# Patient Record
Sex: Male | Born: 1960 | Race: White | Hispanic: No | Smoking: Former smoker
Health system: Southern US, Community
[De-identification: ages and names within clinical notes are randomized; demographics above are authoritative.]

## PROBLEM LIST (undated history)

## (undated) DIAGNOSIS — N200 Calculus of kidney: Secondary | ICD-10-CM

## (undated) DIAGNOSIS — Z87442 Personal history of urinary calculi: Secondary | ICD-10-CM

## (undated) DIAGNOSIS — K219 Gastro-esophageal reflux disease without esophagitis: Secondary | ICD-10-CM

## (undated) DIAGNOSIS — F32A Depression, unspecified: Secondary | ICD-10-CM

## (undated) DIAGNOSIS — F319 Bipolar disorder, unspecified: Secondary | ICD-10-CM

## (undated) DIAGNOSIS — F4310 Post-traumatic stress disorder, unspecified: Secondary | ICD-10-CM

## (undated) DIAGNOSIS — Z8719 Personal history of other diseases of the digestive system: Secondary | ICD-10-CM

## (undated) DIAGNOSIS — M1990 Unspecified osteoarthritis, unspecified site: Secondary | ICD-10-CM

## (undated) DIAGNOSIS — I82409 Acute embolism and thrombosis of unspecified deep veins of unspecified lower extremity: Secondary | ICD-10-CM

## (undated) DIAGNOSIS — I219 Acute myocardial infarction, unspecified: Secondary | ICD-10-CM

## (undated) DIAGNOSIS — E039 Hypothyroidism, unspecified: Secondary | ICD-10-CM

## (undated) DIAGNOSIS — I10 Essential (primary) hypertension: Secondary | ICD-10-CM

## (undated) DIAGNOSIS — R5383 Other fatigue: Secondary | ICD-10-CM

## (undated) DIAGNOSIS — E785 Hyperlipidemia, unspecified: Secondary | ICD-10-CM

## (undated) DIAGNOSIS — J449 Chronic obstructive pulmonary disease, unspecified: Secondary | ICD-10-CM

## (undated) DIAGNOSIS — I509 Heart failure, unspecified: Secondary | ICD-10-CM

## (undated) DIAGNOSIS — N400 Enlarged prostate without lower urinary tract symptoms: Secondary | ICD-10-CM

## (undated) DIAGNOSIS — G709 Myoneural disorder, unspecified: Secondary | ICD-10-CM

## (undated) DIAGNOSIS — I2510 Atherosclerotic heart disease of native coronary artery without angina pectoris: Secondary | ICD-10-CM

## (undated) DIAGNOSIS — K259 Gastric ulcer, unspecified as acute or chronic, without hemorrhage or perforation: Secondary | ICD-10-CM

## (undated) DIAGNOSIS — I209 Angina pectoris, unspecified: Secondary | ICD-10-CM

## (undated) DIAGNOSIS — T7840XA Allergy, unspecified, initial encounter: Secondary | ICD-10-CM

## (undated) DIAGNOSIS — J441 Chronic obstructive pulmonary disease with (acute) exacerbation: Secondary | ICD-10-CM

## (undated) HISTORY — DX: Calculus of kidney: N20.0

## (undated) HISTORY — PX: APPENDECTOMY: SHX54

## (undated) HISTORY — DX: Chronic obstructive pulmonary disease with (acute) exacerbation: J44.1

## (undated) HISTORY — PX: HEMORRHOIDECTOMY WITH HEMORRHOID BANDING: SHX5633

## (undated) HISTORY — PX: OTHER SURGICAL HISTORY: SHX169

## (undated) HISTORY — DX: Allergy, unspecified, initial encounter: T78.40XA

## (undated) HISTORY — DX: Myoneural disorder, unspecified: G70.9

## (undated) HISTORY — DX: Other fatigue: R53.83

## (undated) HISTORY — PX: JOINT REPLACEMENT: SHX530

## (undated) HISTORY — PX: INNER EAR SURGERY: SHX679

## (undated) HISTORY — PX: HERNIA REPAIR: SHX51

---

## 2004-08-14 ENCOUNTER — Emergency Department: Payer: Self-pay | Admitting: Emergency Medicine

## 2004-08-26 ENCOUNTER — Ambulatory Visit: Payer: Self-pay | Admitting: Specialist

## 2004-08-29 ENCOUNTER — Ambulatory Visit: Payer: Self-pay | Admitting: Gastroenterology

## 2004-09-30 ENCOUNTER — Ambulatory Visit: Payer: Self-pay | Admitting: Specialist

## 2004-10-08 ENCOUNTER — Ambulatory Visit: Payer: Self-pay | Admitting: Gastroenterology

## 2004-10-14 ENCOUNTER — Inpatient Hospital Stay: Payer: Self-pay | Admitting: Unknown Physician Specialty

## 2004-11-18 ENCOUNTER — Ambulatory Visit: Payer: Self-pay | Admitting: Gastroenterology

## 2005-01-20 ENCOUNTER — Emergency Department: Payer: Self-pay | Admitting: Internal Medicine

## 2005-01-24 ENCOUNTER — Ambulatory Visit: Payer: Self-pay | Admitting: Specialist

## 2005-03-06 ENCOUNTER — Ambulatory Visit: Payer: Self-pay | Admitting: Specialist

## 2005-09-19 ENCOUNTER — Observation Stay: Payer: Self-pay | Admitting: Internal Medicine

## 2005-09-19 ENCOUNTER — Other Ambulatory Visit: Payer: Self-pay

## 2005-09-20 ENCOUNTER — Inpatient Hospital Stay: Payer: Self-pay | Admitting: Psychiatry

## 2006-03-07 ENCOUNTER — Inpatient Hospital Stay: Payer: Self-pay | Admitting: Internal Medicine

## 2006-03-29 ENCOUNTER — Emergency Department: Payer: Self-pay | Admitting: Internal Medicine

## 2006-05-09 ENCOUNTER — Emergency Department: Payer: Self-pay | Admitting: Emergency Medicine

## 2006-09-01 ENCOUNTER — Ambulatory Visit: Payer: Self-pay | Admitting: Anesthesiology

## 2007-06-13 ENCOUNTER — Emergency Department: Payer: Self-pay | Admitting: Emergency Medicine

## 2008-03-22 ENCOUNTER — Ambulatory Visit: Payer: Self-pay | Admitting: Urology

## 2008-05-24 ENCOUNTER — Ambulatory Visit: Payer: Self-pay | Admitting: Gastroenterology

## 2009-01-29 ENCOUNTER — Ambulatory Visit: Payer: Self-pay | Admitting: Urology

## 2009-02-16 ENCOUNTER — Ambulatory Visit: Payer: Self-pay | Admitting: Urology

## 2009-04-02 ENCOUNTER — Ambulatory Visit: Payer: Self-pay | Admitting: Gastroenterology

## 2009-04-30 ENCOUNTER — Ambulatory Visit: Payer: Self-pay | Admitting: Urology

## 2009-05-14 ENCOUNTER — Ambulatory Visit: Payer: Self-pay | Admitting: Urology

## 2009-05-16 ENCOUNTER — Ambulatory Visit: Payer: Self-pay | Admitting: Gastroenterology

## 2009-06-11 ENCOUNTER — Ambulatory Visit: Payer: Self-pay | Admitting: Urology

## 2009-06-18 ENCOUNTER — Ambulatory Visit: Payer: Self-pay | Admitting: Urology

## 2009-07-19 ENCOUNTER — Ambulatory Visit: Payer: Self-pay | Admitting: Urology

## 2009-07-30 ENCOUNTER — Ambulatory Visit: Payer: Self-pay | Admitting: Urology

## 2009-08-07 ENCOUNTER — Ambulatory Visit: Payer: Self-pay | Admitting: Urology

## 2009-08-20 ENCOUNTER — Ambulatory Visit: Payer: Self-pay | Admitting: Urology

## 2009-09-17 ENCOUNTER — Ambulatory Visit: Payer: Self-pay | Admitting: Urology

## 2009-10-01 ENCOUNTER — Ambulatory Visit: Payer: Self-pay | Admitting: Urology

## 2009-10-19 ENCOUNTER — Ambulatory Visit: Payer: Self-pay | Admitting: Urology

## 2009-11-08 ENCOUNTER — Ambulatory Visit: Payer: Self-pay | Admitting: Urology

## 2009-11-13 ENCOUNTER — Ambulatory Visit: Payer: Self-pay | Admitting: Urology

## 2009-12-18 ENCOUNTER — Encounter: Payer: Self-pay | Admitting: Orthopedic Surgery

## 2010-01-13 ENCOUNTER — Encounter: Payer: Self-pay | Admitting: Orthopedic Surgery

## 2010-02-13 ENCOUNTER — Encounter: Payer: Self-pay | Admitting: Orthopedic Surgery

## 2010-03-15 ENCOUNTER — Encounter: Payer: Self-pay | Admitting: Orthopedic Surgery

## 2010-03-21 ENCOUNTER — Ambulatory Visit: Payer: Self-pay | Admitting: Orthopedic Surgery

## 2010-04-15 ENCOUNTER — Encounter: Payer: Self-pay | Admitting: Orthopedic Surgery

## 2011-02-02 IMAGING — CR DG IVP HYPERTENSIVE
1 series · 8 of 10 positions shown · non-contrast
Comparison: none

REASON FOR EXAM: kidney stone gross hematuria
COMMENTS:

[Series 1: view not recorded · 0.17mm/px · 8 of 14 slices shown]
[im 1/14]
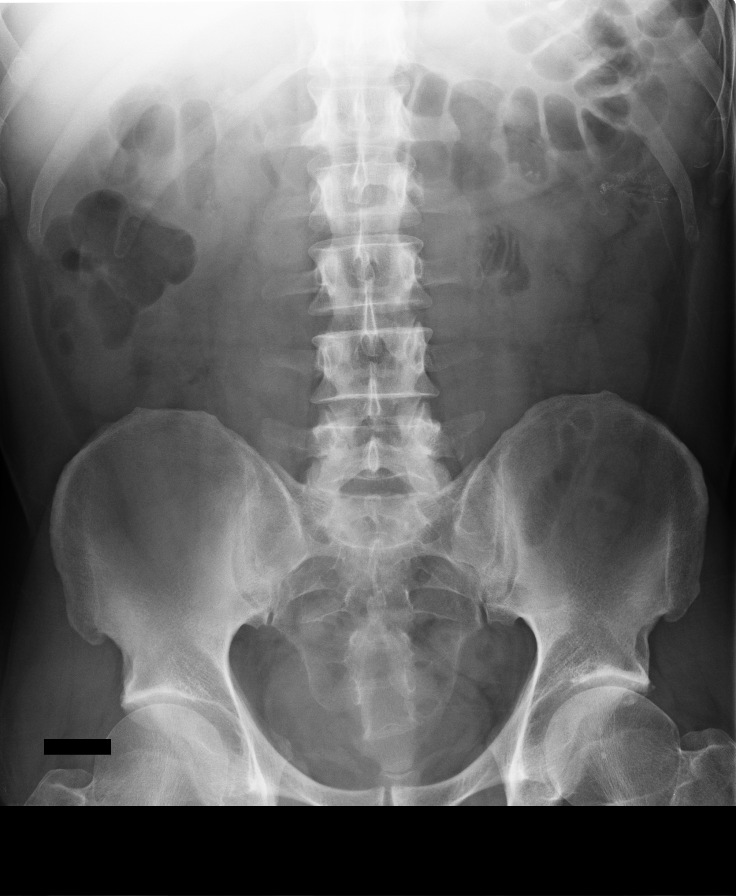
[im 2/14]
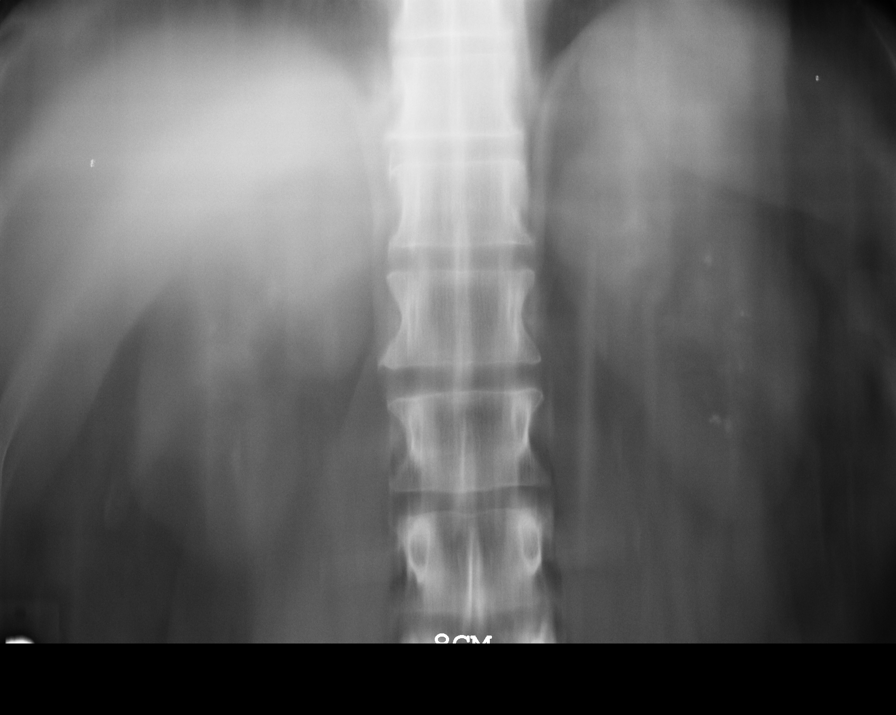
[im 3/14]
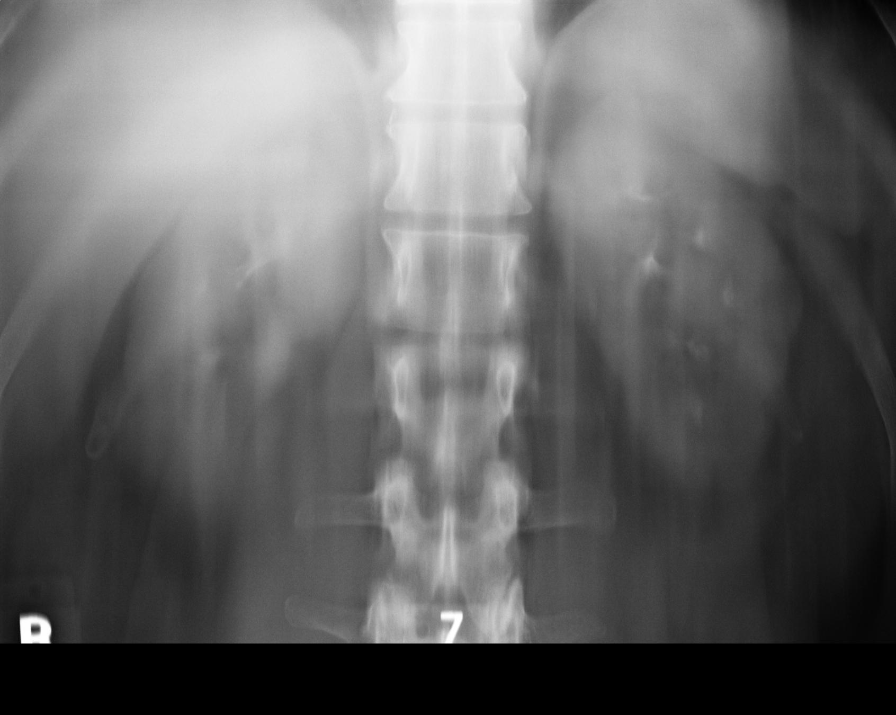
[im 5/14]
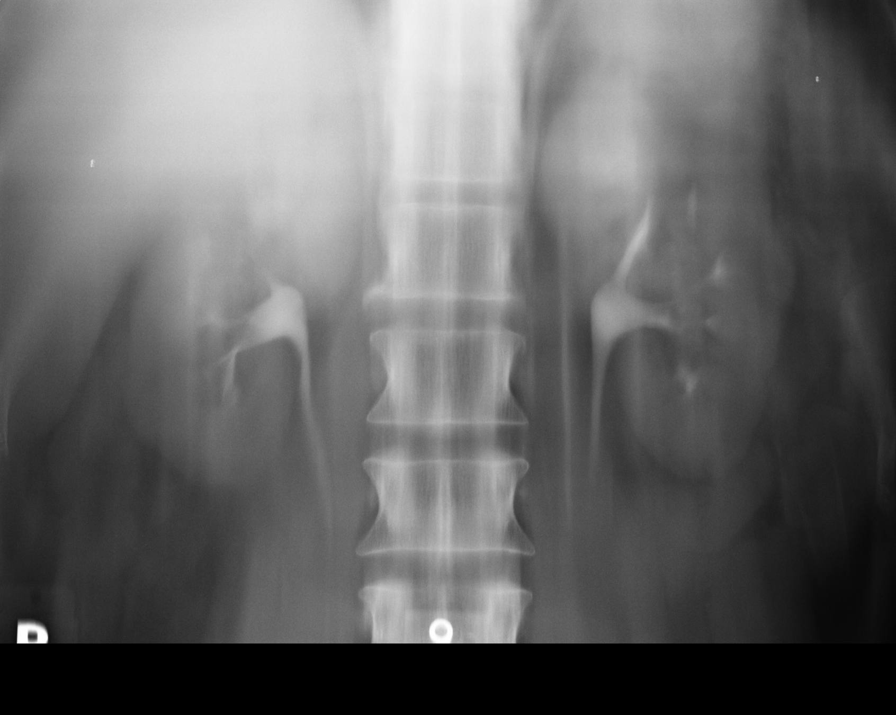
[im 6/14]
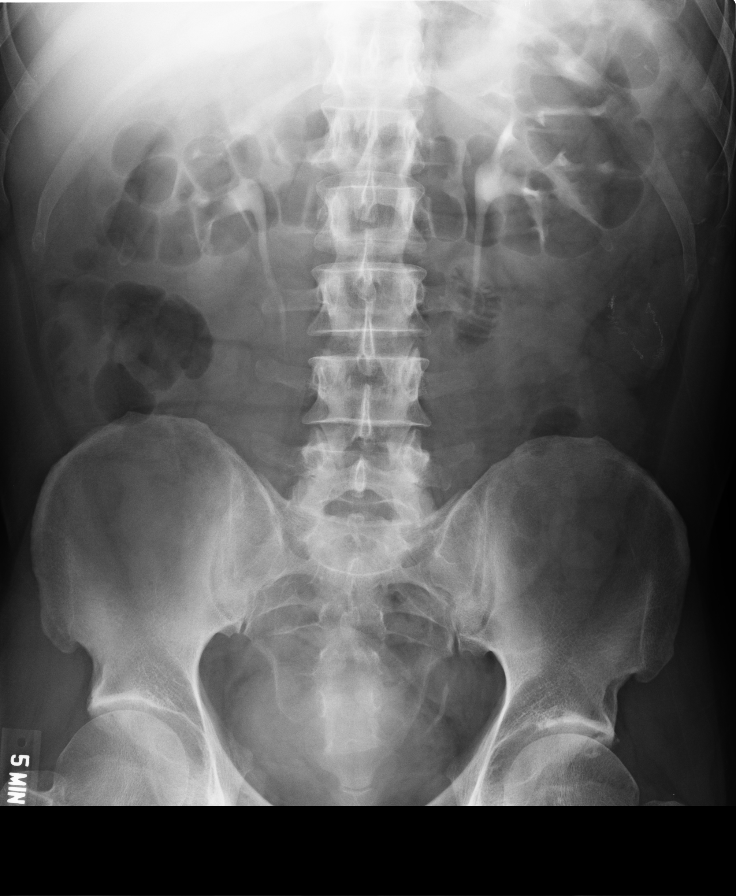
[im 8/14]
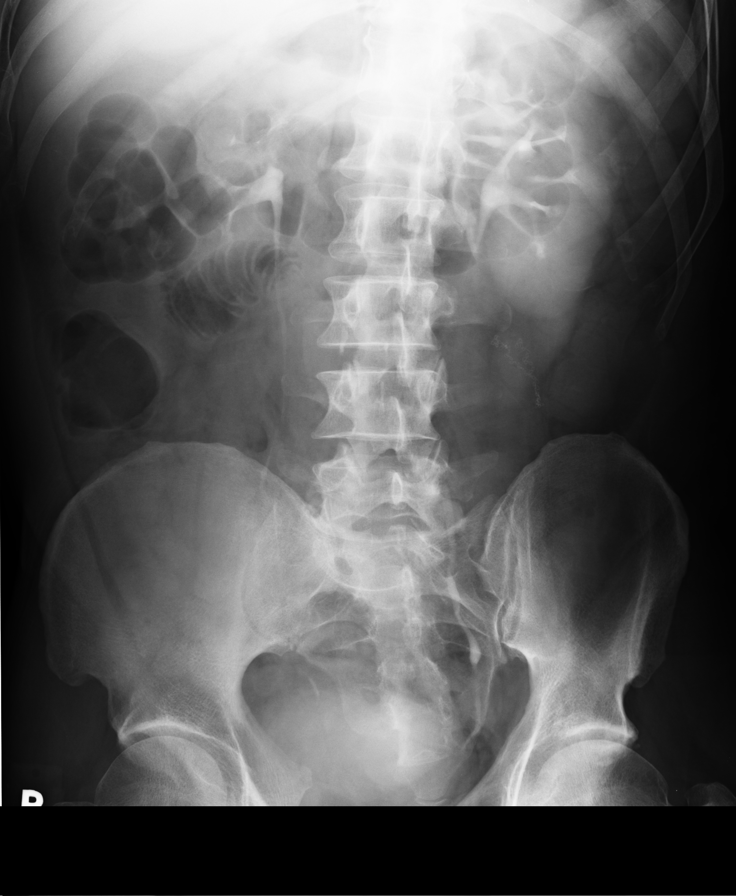
[im 9/14]
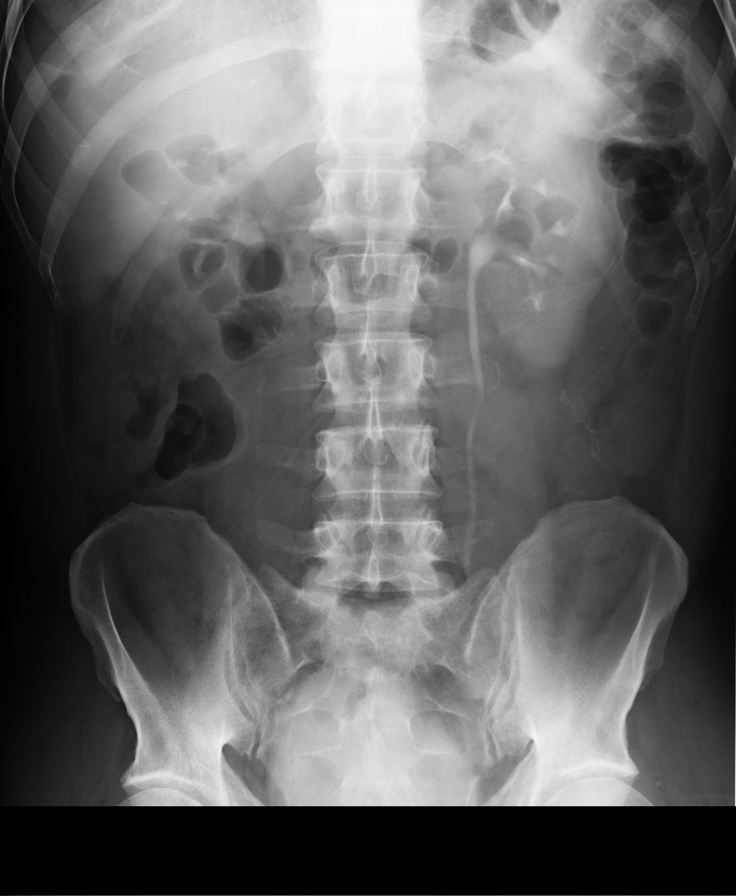
[im 11/14]
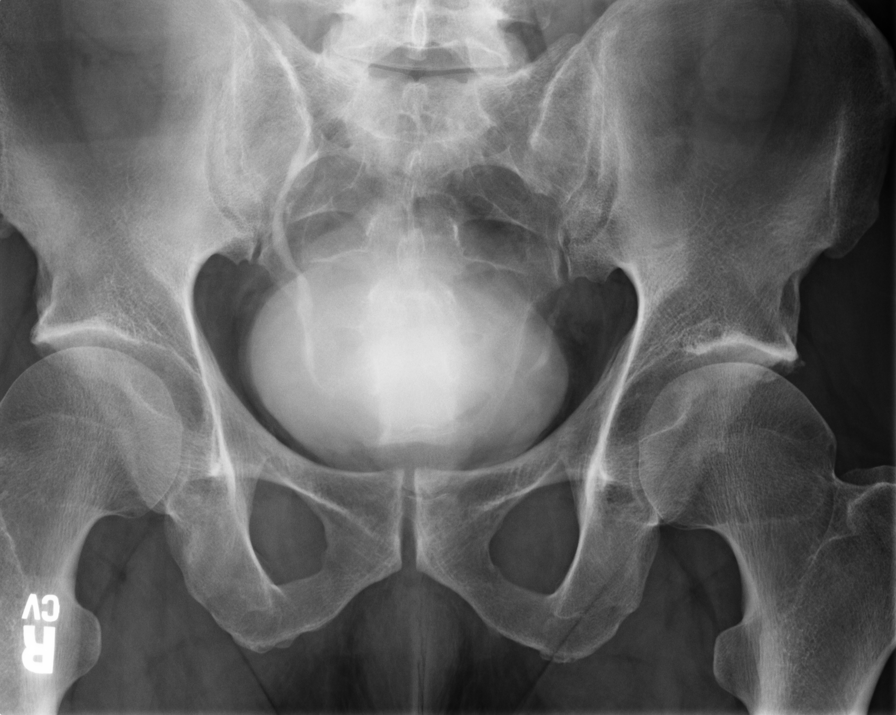

[8 of 10 positions shown; findings below may reference images not displayed]

PROCEDURE:     DXR - DXR INTRAVENOUS UROGRAPHY (IVP)  - February 16, 2009 [DATE]

RESULT:     Following administration of contrast there is prompt bilateral
renal function. Size, shape and position of both kidneys are normal. No
evidence of ureteral obstruction. The bladder is normal.  Postvoid residual
is normal.
IMPRESSION: Normal exam.

## 2011-04-30 IMAGING — CR DG ABDOMEN 1V
1 series · 2 of 2 positions shown · non-contrast
Comparison: none

REASON FOR EXAM: kidney stone
COMMENTS:

[Series 1: view not recorded · 0.17mm/px · 2 of 2 slices shown]
[im 1/2]
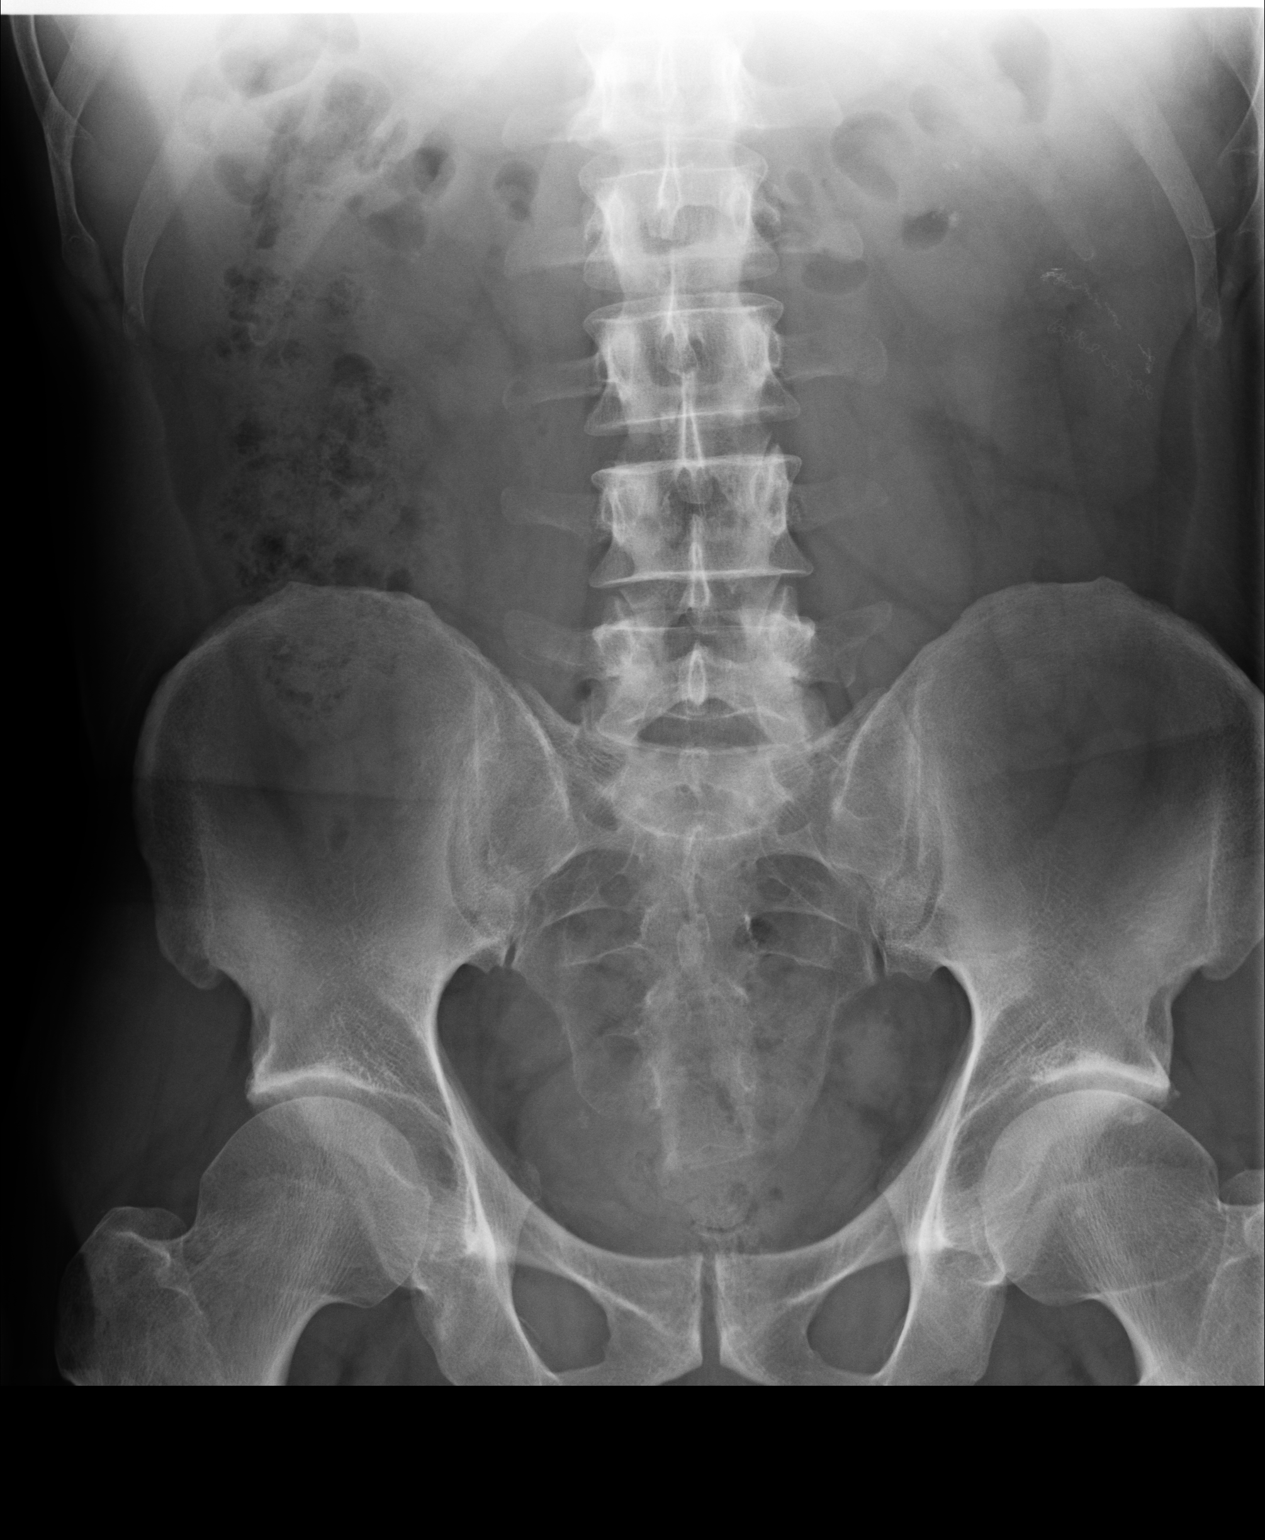
[im 2/2]
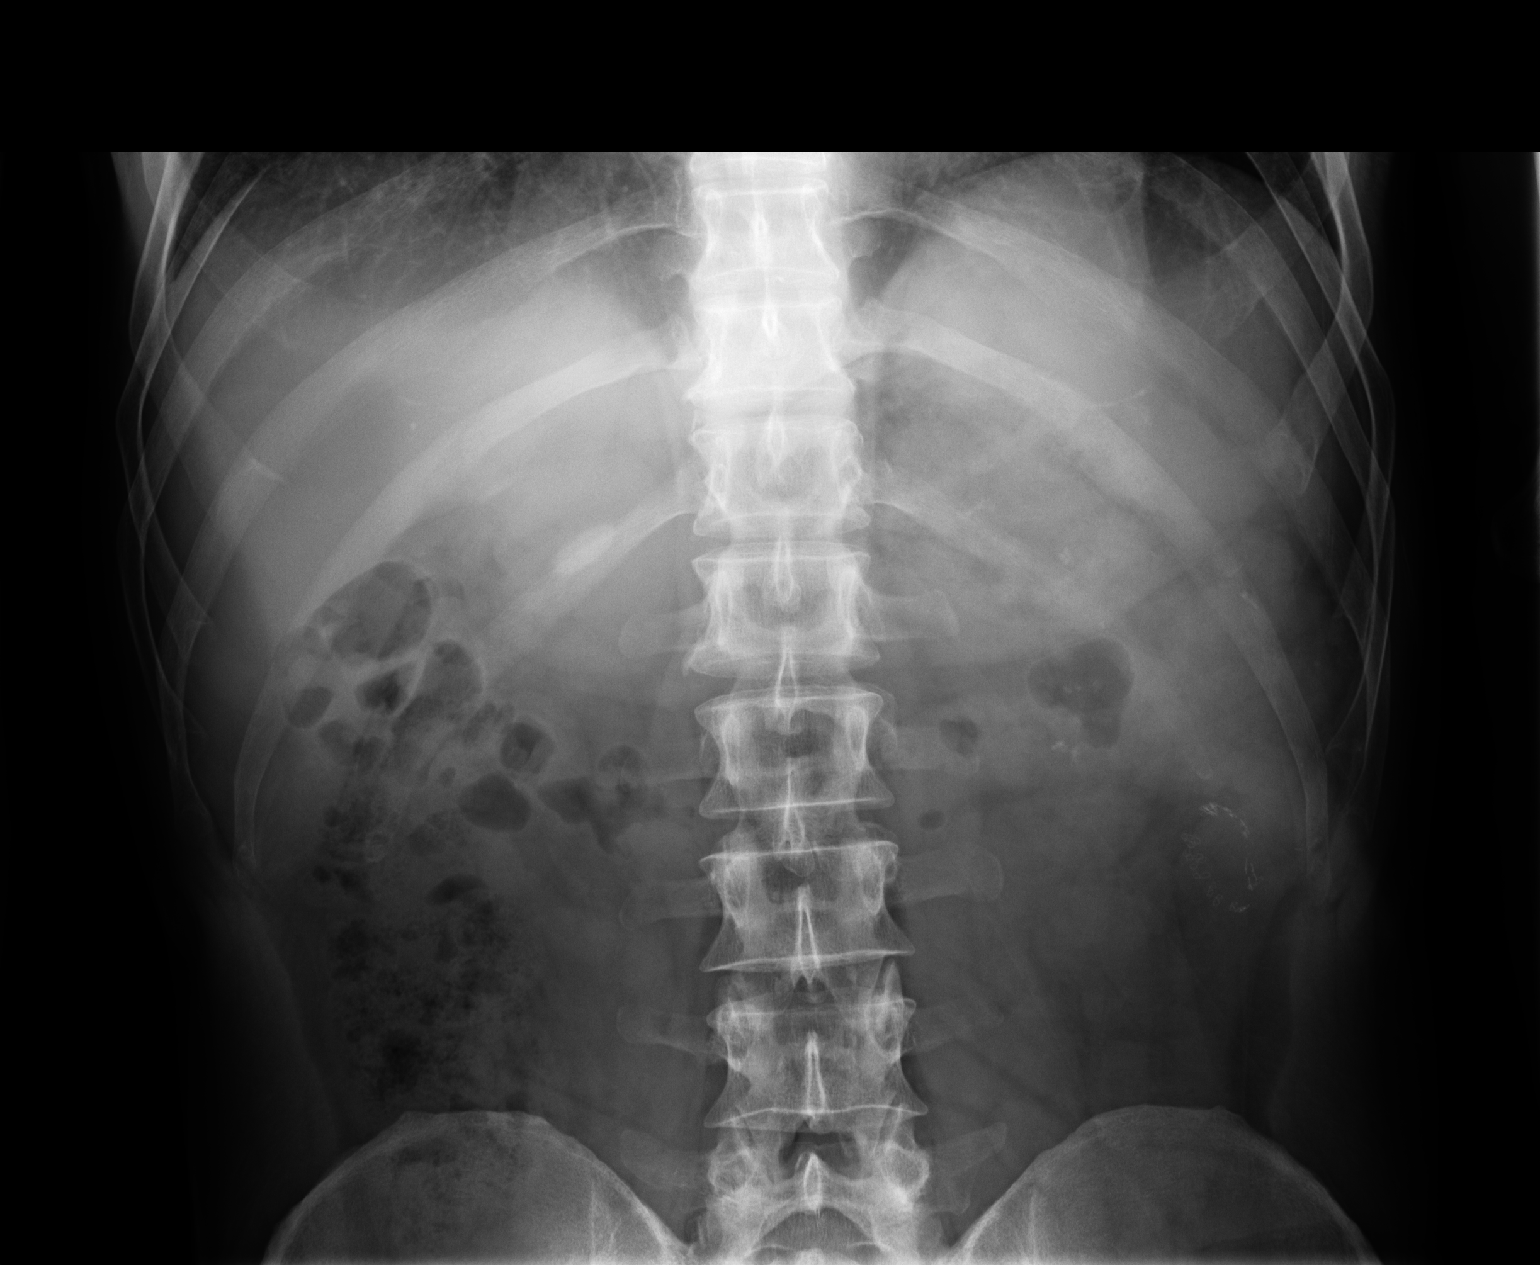

[2 of 2 positions shown; findings below may reference images not displayed]

PROCEDURE:     MDR - MDR KIDNEY URETER BLADDER  - May 14, 2009  [DATE]

RESULT:     Comparison is made to the prior exam of 04/30/2009. Multiple, 3
to 4 mm calcifications are again visualized over the lower pole of the left
kidney. There is a 1 mm calcification in the left midpole region and in the
upper pole a 3 mm calcification is observed. No definite renal
calcifications are seen on the right. The tiny calcific density low in the
right pelvis noted on the exam of 04/30/2009 is no longer seen but in this
exam there is a new 4 mm faint calcific density projected adjacent to the
lateral margin of the right sacrum and which could represent a different
distal right ureteral stone. This density could also be artifactual.
IMPRESSION: 1. Left nephrolithiasis.
2. Possible distal right ureteral stone.
3. Although not mentioned above there are a few tiny densities projected
over the lower pole of the right kidney consistent with right
nephrolithiasis.

## 2011-07-05 IMAGING — CR DG ABDOMEN 1V
1 series · 1 of 1 positions shown · non-contrast
Comparison: none

REASON FOR EXAM: kidney stone
COMMENTS:

[view not recorded]
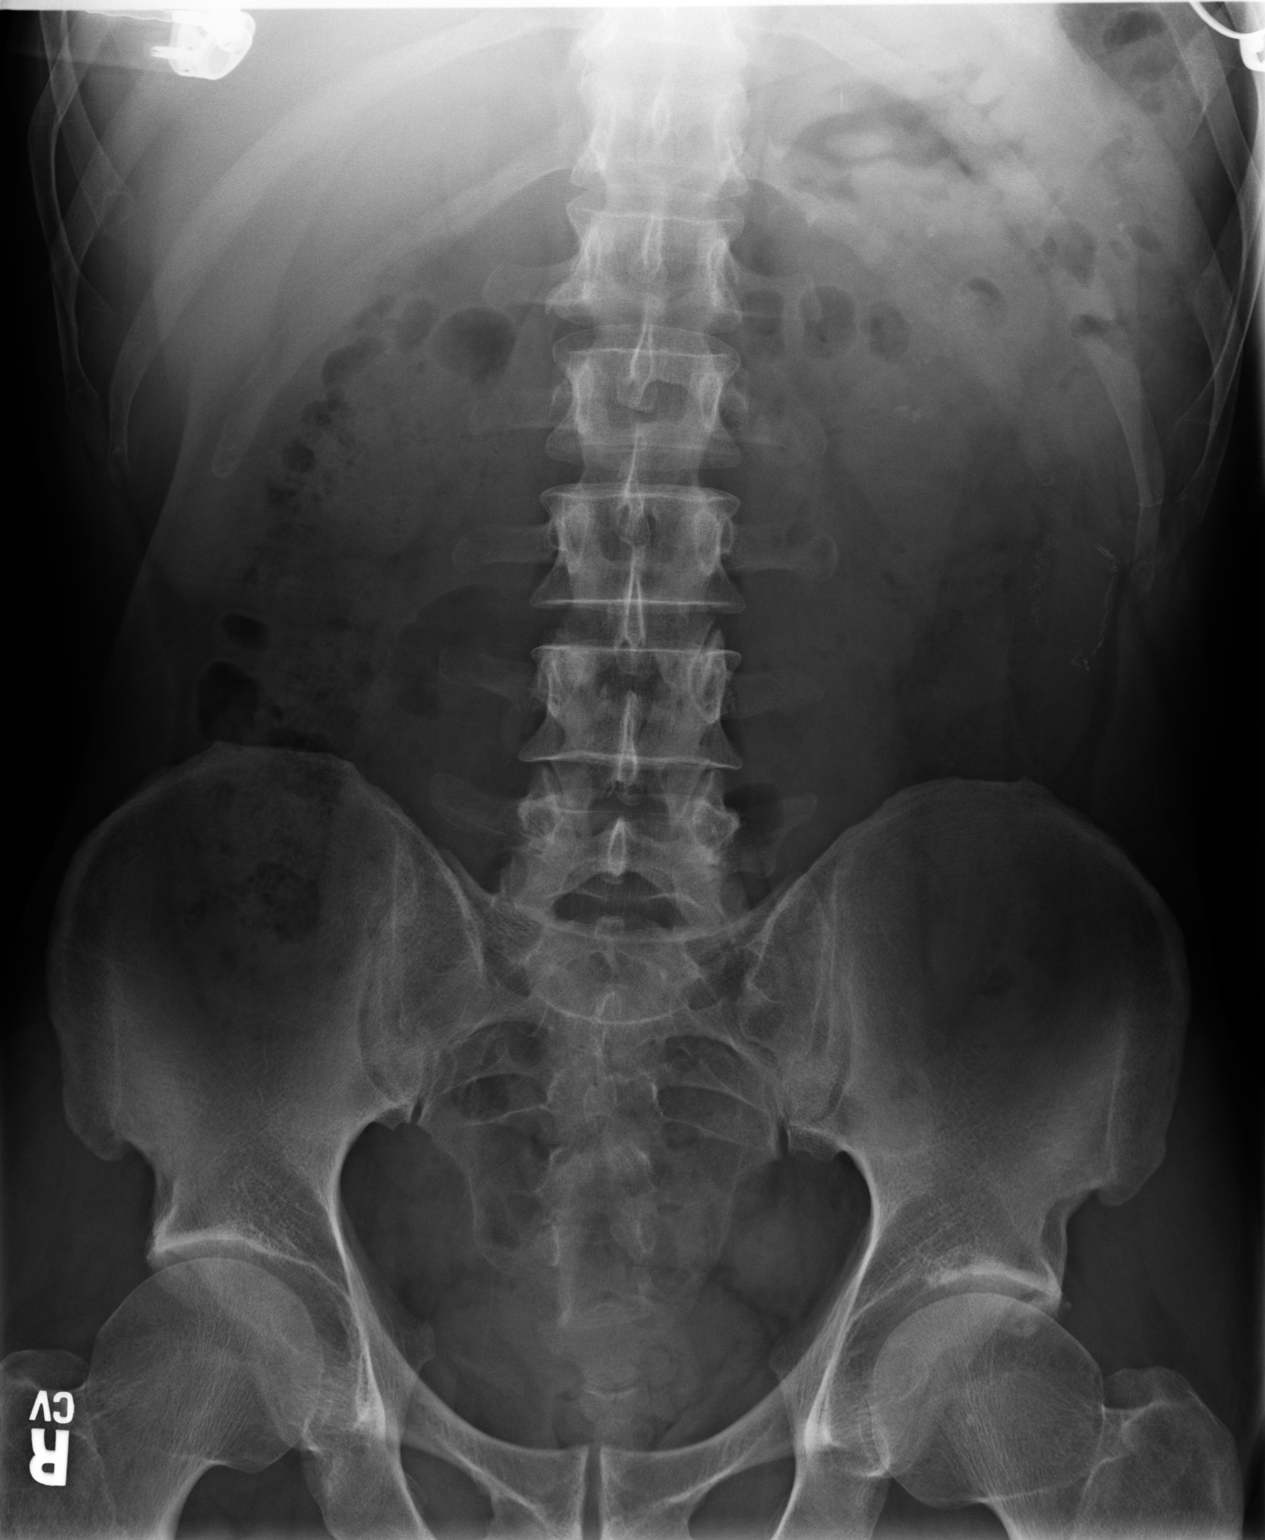

[1 of 1 positions shown; findings below may reference images not displayed]

PROCEDURE:     MDR - MDR KIDNEY URETER BLADDER  - July 19, 2009 [DATE]

RESULT:     Comparison is made to the prior exam of 06/18/2009. There are
again noted multiple small calcifications measuring between 1 and 4 mm in
diameter. These are projected over the upper, mid and lower pole region. No
definite right renal calcifications are seen. No ureteral calcifications are
identified.
IMPRESSION: 1.     Left nephrolithiasis.

## 2012-02-20 ENCOUNTER — Other Ambulatory Visit: Payer: Self-pay | Admitting: Internal Medicine

## 2012-02-20 LAB — CBC WITH DIFFERENTIAL/PLATELET
Basophil %: 0.3 %
Eosinophil #: 0.1 10*3/uL (ref 0.0–0.7)
Eosinophil %: 0.4 %
HCT: 43.1 % (ref 40.0–52.0)
HGB: 13.9 g/dL (ref 13.0–18.0)
Lymphocyte #: 2.1 10*3/uL (ref 1.0–3.6)
Lymphocyte %: 16.1 %
MCHC: 32.2 g/dL (ref 32.0–36.0)
MCV: 97 fL (ref 80–100)
Monocyte #: 0.9 x10 3/mm (ref 0.2–1.0)
Neutrophil #: 9.9 10*3/uL — ABNORMAL HIGH (ref 1.4–6.5)
Platelet: 193 10*3/uL (ref 150–440)
RDW: 13.5 % (ref 11.5–14.5)
WBC: 12.9 10*3/uL — ABNORMAL HIGH (ref 3.8–10.6)

## 2012-02-20 LAB — BASIC METABOLIC PANEL
Anion Gap: 7 (ref 7–16)
Calcium, Total: 8.6 mg/dL (ref 8.5–10.1)
Co2: 27 mmol/L (ref 21–32)
Glucose: 125 mg/dL — ABNORMAL HIGH (ref 65–99)
Sodium: 140 mmol/L (ref 136–145)

## 2012-02-20 LAB — PROTIME-INR: Prothrombin Time: 25 secs — ABNORMAL HIGH (ref 11.5–14.7)

## 2012-02-23 ENCOUNTER — Ambulatory Visit: Payer: Self-pay | Admitting: Internal Medicine

## 2012-02-23 LAB — PROTIME-INR: Prothrombin Time: 13.5 secs (ref 11.5–14.7)

## 2013-08-09 ENCOUNTER — Emergency Department: Payer: Self-pay | Admitting: Emergency Medicine

## 2014-10-05 ENCOUNTER — Ambulatory Visit: Payer: Self-pay | Admitting: Specialist

## 2014-10-05 DIAGNOSIS — I259 Chronic ischemic heart disease, unspecified: Secondary | ICD-10-CM

## 2014-10-05 LAB — BASIC METABOLIC PANEL
Anion Gap: 5 — ABNORMAL LOW (ref 7–16)
BUN: 10 mg/dL (ref 7–18)
Calcium, Total: 9.1 mg/dL (ref 8.5–10.1)
Chloride: 106 mmol/L (ref 98–107)
Co2: 29 mmol/L (ref 21–32)
Creatinine: 1.1 mg/dL (ref 0.60–1.30)
GLUCOSE: 105 mg/dL — AB (ref 65–99)
OSMOLALITY: 279 (ref 275–301)
Potassium: 4.5 mmol/L (ref 3.5–5.1)
Sodium: 140 mmol/L (ref 136–145)

## 2014-10-05 LAB — URINALYSIS, COMPLETE
Bacteria: NONE SEEN
Bilirubin,UR: NEGATIVE
Blood: NEGATIVE
GLUCOSE, UR: NEGATIVE mg/dL (ref 0–75)
KETONE: NEGATIVE
Leukocyte Esterase: NEGATIVE
Nitrite: NEGATIVE
Ph: 7 (ref 4.5–8.0)
Protein: NEGATIVE
RBC,UR: <1 /HPF (ref 0–5)
SPECIFIC GRAVITY: 1.009 (ref 1.003–1.030)
Squamous Epithelial: NONE SEEN

## 2014-10-05 LAB — CBC
HCT: 43.6 % (ref 40.0–52.0)
HGB: 14.3 g/dL (ref 13.0–18.0)
MCH: 30.9 pg (ref 26.0–34.0)
MCHC: 32.8 g/dL (ref 32.0–36.0)
MCV: 94 fL (ref 80–100)
Platelet: 240 10*3/uL (ref 150–440)
RBC: 4.63 10*6/uL (ref 4.40–5.90)
RDW: 14.6 % — ABNORMAL HIGH (ref 11.5–14.5)
WBC: 18.6 10*3/uL — AB (ref 3.8–10.6)

## 2014-10-05 LAB — PROTIME-INR
INR: 1.4
PROTHROMBIN TIME: 16.7 s — AB (ref 11.5–14.7)

## 2014-10-05 LAB — MRSA PCR SCREENING

## 2014-10-05 LAB — APTT: Activated PTT: 36.5 secs — ABNORMAL HIGH (ref 23.6–35.9)

## 2014-10-10 ENCOUNTER — Inpatient Hospital Stay: Payer: Self-pay | Admitting: Specialist

## 2014-10-10 LAB — PROTIME-INR
INR: 0.9
Prothrombin Time: 12.2 secs (ref 11.5–14.7)

## 2014-10-11 LAB — HEMOGLOBIN: HGB: 12.2 g/dL — AB (ref 13.0–18.0)

## 2014-10-11 LAB — BASIC METABOLIC PANEL
Anion Gap: 6 — ABNORMAL LOW (ref 7–16)
BUN: 7 mg/dL (ref 7–18)
CALCIUM: 8.4 mg/dL — AB (ref 8.5–10.1)
CHLORIDE: 101 mmol/L (ref 98–107)
CO2: 31 mmol/L (ref 21–32)
Creatinine: 0.98 mg/dL (ref 0.60–1.30)
EGFR (Non-African Amer.): >60
Glucose: 129 mg/dL — ABNORMAL HIGH (ref 65–99)
Osmolality: 275 (ref 275–301)
Potassium: 3.7 mmol/L (ref 3.5–5.1)
SODIUM: 138 mmol/L (ref 136–145)

## 2014-10-12 LAB — CBC WITH DIFFERENTIAL/PLATELET
Basophil #: 0.1 10*3/uL (ref 0.0–0.1)
Basophil %: 1.1 %
Eosinophil #: 0.1 10*3/uL (ref 0.0–0.7)
Eosinophil %: 0.6 %
HCT: 34.3 % — ABNORMAL LOW (ref 40.0–52.0)
HGB: 11.3 g/dL — ABNORMAL LOW (ref 13.0–18.0)
Lymphocyte #: 1.7 10*3/uL (ref 1.0–3.6)
Lymphocyte %: 13.8 %
MCH: 30.9 pg (ref 26.0–34.0)
MCHC: 33 g/dL (ref 32.0–36.0)
MCV: 94 fL (ref 80–100)
Monocyte #: 1.7 x10 3/mm — ABNORMAL HIGH (ref 0.2–1.0)
Monocyte %: 13.3 %
Neutrophil #: 9 10*3/uL — ABNORMAL HIGH (ref 1.4–6.5)
Neutrophil %: 71.2 %
Platelet: 157 10*3/uL (ref 150–440)
RBC: 3.66 10*6/uL — ABNORMAL LOW (ref 4.40–5.90)
RDW: 14.1 % (ref 11.5–14.5)
WBC: 12.6 10*3/uL — ABNORMAL HIGH (ref 3.8–10.6)

## 2014-10-12 LAB — PROTIME-INR
INR: 1.1
Prothrombin Time: 14.1 secs (ref 11.5–14.7)

## 2014-10-12 LAB — TSH: Thyroid Stimulating Horm: 0.454 u[IU]/mL

## 2014-10-13 LAB — CBC WITH DIFFERENTIAL/PLATELET
BASOS ABS: 0 10*3/uL (ref 0.0–0.1)
BASOS PCT: 0.5 %
Eosinophil #: 0.1 10*3/uL (ref 0.0–0.7)
Eosinophil %: 1.2 %
HCT: 30.3 % — AB (ref 40.0–52.0)
HGB: 10 g/dL — AB (ref 13.0–18.0)
Lymphocyte #: 1.1 10*3/uL (ref 1.0–3.6)
Lymphocyte %: 12.9 %
MCH: 30.9 pg (ref 26.0–34.0)
MCHC: 33 g/dL (ref 32.0–36.0)
MCV: 94 fL (ref 80–100)
MONOS PCT: 12.8 %
Monocyte #: 1.1 x10 3/mm — ABNORMAL HIGH (ref 0.2–1.0)
Neutrophil #: 6.3 10*3/uL (ref 1.4–6.5)
Neutrophil %: 72.6 %
Platelet: 144 10*3/uL — ABNORMAL LOW (ref 150–440)
RBC: 3.23 10*6/uL — AB (ref 4.40–5.90)
RDW: 14.5 % (ref 11.5–14.5)
WBC: 8.7 10*3/uL (ref 3.8–10.6)

## 2014-10-13 LAB — BASIC METABOLIC PANEL
ANION GAP: 5 — AB (ref 7–16)
BUN: 8 mg/dL (ref 7–18)
CHLORIDE: 109 mmol/L — AB (ref 98–107)
CREATININE: 1.04 mg/dL (ref 0.60–1.30)
Calcium, Total: 8.3 mg/dL — ABNORMAL LOW (ref 8.5–10.1)
Co2: 27 mmol/L (ref 21–32)
EGFR (African American): >60
EGFR (Non-African Amer.): >60
Glucose: 125 mg/dL — ABNORMAL HIGH (ref 65–99)
Osmolality: 281 (ref 275–301)
POTASSIUM: 4.1 mmol/L (ref 3.5–5.1)
SODIUM: 141 mmol/L (ref 136–145)

## 2014-10-13 LAB — PROTIME-INR
INR: 2
PROTHROMBIN TIME: 22.1 s — AB (ref 11.5–14.7)

## 2014-10-17 LAB — CULTURE, BLOOD (SINGLE)

## 2015-01-14 NOTE — Op Note (Signed)
PATIENT NAME:  Brett Fernandez, Brett Fernandez MR#:  [id number] DATE OF BIRTH:  06-10-1961  DATE OF PROCEDURE:  10/11/2014  PREOPERATIVE DIAGNOSIS: Severe degenerative arthritis, left knee.   PROCEDURE: LCS left total knee replacement.   SURGEON: Clare Gandy, MD   ASSISTANT: Kathreen Devoid, MD  ANESTHESIA: Spinal.   COMPLICATIONS: None.   TOURNIQUET TIME: 108 minutes.   DESCRIPTION OF PROCEDURE: Two grams of Ancef was given intravenously prior to the procedure. Spinal anesthesia is induced. The left lower extremity is thoroughly prepped with ChloraPrep and alcohol, and draped in standard sterile fashion. The extremity is wrapped out with the Esmarch bandage, and the pneumatic tourniquet elevated to 325 mmHg. A standard anterior longitudinal incision is made for a total knee replacement, and the dissection carried down to the medial and lateral retinaculum. Standard medial parapatellar incision is made and the knee is flexed, and the patella is reflected laterally. Standard osteophyte and synovial debridement is performed. Retractors are placed and the proximal tibial cutting block is put into place.   Prior to this, it should be noted that a moderate medial release was performed by elevating the tibial collateral ligament off of the femur, and some of the pes anserinus. The proximal tibial cutting block was put into place and pinned, and the proximal tibia was cut in appropriate fashion. The distal femur is sized as a Standard Plus, and the Standard Plus cutting block was drilled and pinned appropriately to the distal femur using the femoral guide for rotation. The anterior and posterior distal femur cuts are made, and are seen to be satisfactory. Distal femoral cutting block, 5 degrees valgus, is impacted into place and pinned, and the distal femoral cut is made. The 10 mm flexion and extension blocks were put into place and were seen to be satisfactory with excellent stability. Femoral shaping  block is impacted into place, and the appropriate cuts made. Proximal tibia is sized as a +5; this is pinned and the central drill hole made. Trial components were then inserted into place and seen to be satisfactory with good stability. Patella is prepared in the standard fashion.   All trial components were then removed. The joint is thoroughly irrigated multiple times with the pulsatile lavage. The +5 metal tibial tray is then cemented into place and the polyethylene rotating platform is then put into position, and then the porous-coated distal femur is impacted into place and the knee extended. There is seen to be full extension with good alignment and stability. Porous-coated patella is impacted into place and is seen to be a good fit.   The wound is thoroughly irrigated multiple times. The peripheral edges of the capsule and the muscle are infiltrated with Exparel. The skin is infiltrated with 0.25% plain Marcaine. Two Autovac drains are brought out through separate stab wound incisions. The fascia is closed with 2-0 Ethibond. Subcutaneous tissue is closed with 2-0 Vicryl, and the skin is closed with the skin stapler. A soft bulky dressing is applied. Two Autovac drains were brought out through separate stab wound incisions. Polar Care and knee immobilizer were applied, and the tourniquet is released. The patient is returned to the recovery room in satisfactory condition, having tolerated the procedure quite well.    ____________________________ Clare Gandy, MD ces:MT D: 10/11/2014 07:53:34 ET T: 10/11/2014 10:27:22 ET JOB#: [id number]  cc: Clare Gandy, MD, <Dictator> Clare Gandy MD ELECTRONICALLY SIGNED 10/11/2014 13:33

## 2015-01-14 NOTE — Consult Note (Signed)
PATIENT NAME:  Brett Fernandez, BOQUET MR#:  [id number] DATE OF BIRTH:  Apr 27, 1961  DATE OF CONSULTATION:  10/12/2014  REFERRING PHYSICIAN:  Kathreen Devoid, MD  CONSULTING PHYSICIAN:  Kelton Pillar. Sheryle Hail, MD  CONSULTATION QUESTION:  Etiology for sinus tachycardia.   HISTORY OF PRESENT ILLNESS:  This is a 54 year old Caucasian male who had been admitted to the hospital for a routine left total knee replacement. The patient underwent the procedure without any complications. He is currently on anticoagulation status post orthopedic surgery as well as bridging warfarin for DVT prophylaxis. He was resting comfortably in bed but had persistent tachycardia, which prompted the staff to call for consultation.   REVIEW OF SYSTEMS:  The patient is very groggy, as it is the middle of the night. He does not endorse any pain in his chest or shortness of breath. He also denies nausea, but mainly complains of pain in his left knee. It is difficult to understand any of the rest of his answers, but he does not explicitly endorse any complaints aside from the aforementioned pain in his knee.   PAST MEDICAL HISTORY:  Coronary artery disease status post MI, hypertension, COPD, GERD, hiatal hernia, tobacco abuse, history of depression with suicide attempts, history of TIA, history of kidney stones, and history of DVT.   PAST SURGICAL HISTORY:  Cardiac catheterization, ear surgery, and multiple leg surgeries.   SOCIAL HISTORY:  The patient smokes 1-1/2 packs per day and has done so for the last 22 years. He denies alcohol or drug use.   FAMILY HISTORY:  Significant for multiple psychiatric illnesses.   PERTINENT LABORATORY RESULTS AND RADIOGRAPHIC FINDINGS:  Serum glucose is 129, BUN 7, creatinine 0.98, serum sodium 138, potassium 3.7, chloride 101, bicarbonate 31, and calcium is 8.4. Thyroid stimulating hormone is 0.45. White blood cell count is 12.6, hemoglobin 11.3, hematocrit 34.3, and platelet count is 157,000. MCV  is 94. INR is 0.9 as of 2 days ago.   There is no recent imaging.   MEDICATIONS: 1.  Hydrocodone 10 mg oral capsule extended release 1 tablet p.o. t.i.d.  2.  Flomax 0.4 mg 1 capsule p.o. daily.  3.  Isosorbide mononitrate 30 mg extended release 1 tablet p.o. daily.  4.  Coumadin 5 mg 1 tablet p.o. daily.  5.  Clonazepam 2 mg 1 tablet p.o. b.i.d.  6.  Cymbalta 60 mg delayed-release capsule 1 capsule p.o. b.i.d.  7.  Mirtazapine 15 mg 1 tablet p.o. b.i.d.  8.  Promethazine 25 mg 1 tablet p.o. b.i.d.  9.  Loratadine 10 mg 1 tablet p.o. daily.  10.  Niaspan 500 mg extended release 1 tablet p.o. at bedtime.  11.  Pravastatin 80 mg 1 tablet p.o. at bedtime.  12.  Geodon 60 mg 1 capsule every morning and at bedtime.  13.  Advair Diskus 250 mcg/50 mcg 1 inhalation 2 times a day.   ALLERGIES:  ASPIRIN, SULFA DRUGS, AND BEE STINGS.   PHYSICAL EXAMINATION: VITAL SIGNS:  Temperature is 97.9, pulse 125, respirations 18, blood pressure 107/67, and pulse oximetry is 95% on room air.  GENERAL:  The patient was sleeping comfortably, but is arousable but very groggy. He is in no apparent distress.  HEENT:  Normocephalic, atraumatic. Pupils are equal, round, and reactive to light and accommodation. Extraocular movements are intact. Mucous membranes are moist.  NECK:  Trachea is midline. No adenopathy. Thyroid is nonpalpable and nontender.  CHEST:  Symmetric and atraumatic.  CARDIOVASCULAR:  Tachycardic rhythm. Normal S1, S2.  No rubs, clicks, or murmurs appreciated.  LUNGS:  Clear to auscultation bilaterally. Normal effort and excursion.  ABDOMEN:  Positive bowel sounds. Soft, nontender, nondistended. No hepatosplenomegaly.  GENITOURINARY:  Deferred.  MUSCULOSKELETAL:  The patient is able to wiggle his right leg and moves both arms without difficulty. There is a knee stabilizer in place on the left lower extremity.  SKIN:  Warm and dry. There are no rashes or lesions.  EXTREMITIES:  No clubbing,  cyanosis, or edema.  NEUROLOGIC:  Cranial nerves II through XII are grossly intact.  PSYCHIATRIC:  Difficult to assess, as the patient clearly has psychoactive drugs on-board, but he appears pleasant and seems to understand the circumstances surrounding his hospitalization; thus, he has some insight and judgment into his medical condition.   ASSESSMENT AND PLAN:  This is a 54 year old male status post left knee arthroplasty, who has multiple medical conditions, who now has sinus tachycardia 1 day postoperatively.   1.  Tachycardia. One might think at this juncture following surgery that he has some atelectasis and perhaps some associated tachycardia. More likely, some of the tachycardia is due to pain; however, I have ordered a thyroid-stimulating hormone, which appears to be normal at this reading. I also ordered a 1 liter bolus to see if this helps lower his heart rate. He is on plenty of anticoagulation at this time and is not short of breath, thus pulmonary embolism from deep vein thrombosis seems to be highly unlikely at this time. He has lost some blood since his surgery yesterday, but certainly not enough to produce such persistent tachycardia. Internal medicine service will continue to follow for further possible intervention. At this time, the patient is stable, and we will continue to monitor him on telemetry.  2.  Deep vein thrombosis prophylaxis. Continue anticoagulation.    ____________________________ Kelton Pillar. Sheryle Hail, MD msd:nb D: 10/12/2014 06:59:25 ET T: 10/12/2014 07:11:52 ET JOB#: [id number]  cc: Kelton Pillar. Sheryle Hail, MD, <Dictator> Kelton Pillar Saraiyah Hemminger MD ELECTRONICALLY SIGNED 10/18/2014 7:00

## 2015-01-14 NOTE — Discharge Summary (Signed)
PATIENT NAME:  Brett Fernandez, Brett Fernandez MR#:  [id number] DATE OF BIRTH:  Nov 27, 1960  DATE OF ADMISSION:  10/10/2014 DATE OF DISCHARGE:    DISCHARGE DIAGNOSES: 1.  Severe degenerative arthritis, left knee.  2.  Acute respiratory failure, likely chronic obstructive pulmonary disease exacerbation. 3.  Aspiration pneumonitis.  4.  Postoperative sinus tachycardia.  5.  Postoperative acute urinary retention.  6.  Previous history of pulmonary embolism.   HISTORY AND PHYSICAL EXAMINATION: As written on admission and as per the office notes which were submitted along with this. Laboratory evaluation and medical clearance was obtained as documented.   HOSPITAL COURSE: The patient was taken to the Operating Room on 10/10/2014 for a left total knee replacement which was performed without difficulty. The patient postoperatively did well until he did develop a tachycardia associated with some fever. The hospitalist system was consulted and chest x-ray demonstrated evidence of some pneumonitis which was felt to be aspiration related. He was treated with Levaquin for this problem and is to continue this for 8 days total. He also had an element of respiratory failure. CT scan of the lungs was obtained which demonstrated probable old small thrombus. He was restarted on his previous Coumadin and Lovenox was started immediately after surgery. By 10/13/2014, his INR was therapeutic and he was placed back on his regular preoperative dose of Coumadin 5 mg per day. Ultrasound of the lower extremities demonstrated no evidence of deep venous thrombosis. He was begun on physical therapy with ambulation, weight-bearing as tolerated. He is to work on vigorous range of motion of the left knee. Please change the bandage as necessary. He is given a return appointment here to be seen in my office in 12 days. He also was started in the hospital on 5 mg of finasteride. Please discontinue the Levaquin in 7 days from today. Otherwise, his orders  are as on the order sheet at discharge.    ____________________________ Clare Gandy, MD ces:at D: 10/13/2014 12:31:02 ET T: 10/13/2014 12:45:37 ET JOB#: [id number]  cc: Clare Gandy, MD, <Dictator> Clare Gandy MD ELECTRONICALLY SIGNED 10/21/2014 11:59

## 2015-01-14 NOTE — Consult Note (Signed)
Brief Consult Note: Diagnosis: Sinus tachycardia.   Patient was seen by consultant.   Consult note dictated.   Orders entered.   Comments: Likely hypovolemia.  Ddx includes hypothyroidism, PE (low likelihood given dual anticoagulant).  Ordered NS bolus.  Check H/H and TSH.  Electronic Signatures: Arnaldo Natal (MD)  (Signed 28-Jan-16 05:37)  Authored: Brief Consult Note   Last Updated: 28-Jan-16 05:37 by Arnaldo Natal (MD)

## 2015-06-22 ENCOUNTER — Telehealth: Payer: Self-pay | Admitting: Gastroenterology

## 2015-06-22 NOTE — Telephone Encounter (Signed)
Called patient for colon triage, he was out of town for a week or more, he said he will contact office when he gets back for colonoscopy

## 2015-10-03 ENCOUNTER — Other Ambulatory Visit: Payer: Medicaid Other

## 2015-10-17 ENCOUNTER — Inpatient Hospital Stay: Admission: RE | Admit: 2015-10-17 | Payer: Medicaid Other | Source: Ambulatory Visit

## 2015-10-17 ENCOUNTER — Other Ambulatory Visit: Payer: Medicaid Other

## 2015-10-24 ENCOUNTER — Ambulatory Visit
Admission: RE | Admit: 2015-10-24 | Discharge: 2015-10-24 | Disposition: A | Discharge status: Home / Self Care | Payer: Medicaid Other | Source: Ambulatory Visit | Attending: Orthopedic Surgery | Admitting: Orthopedic Surgery

## 2015-10-24 ENCOUNTER — Encounter
Admission: RE | Admit: 2015-10-24 | Discharge: 2015-10-24 | Disposition: A | Discharge status: Home / Self Care | Payer: Medicaid Other | Source: Ambulatory Visit | Attending: Orthopedic Surgery | Admitting: Orthopedic Surgery

## 2015-10-24 DIAGNOSIS — I252 Old myocardial infarction: Secondary | ICD-10-CM | POA: Diagnosis not present

## 2015-10-24 DIAGNOSIS — F17200 Nicotine dependence, unspecified, uncomplicated: Secondary | ICD-10-CM

## 2015-10-24 DIAGNOSIS — Z87891 Personal history of nicotine dependence: Secondary | ICD-10-CM | POA: Diagnosis not present

## 2015-10-24 DIAGNOSIS — I2510 Atherosclerotic heart disease of native coronary artery without angina pectoris: Secondary | ICD-10-CM

## 2015-10-24 DIAGNOSIS — M1711 Unilateral primary osteoarthritis, right knee: Secondary | ICD-10-CM | POA: Insufficient documentation

## 2015-10-24 DIAGNOSIS — J439 Emphysema, unspecified: Secondary | ICD-10-CM | POA: Diagnosis not present

## 2015-10-24 DIAGNOSIS — Z01818 Encounter for other preprocedural examination: Secondary | ICD-10-CM | POA: Insufficient documentation

## 2015-10-24 HISTORY — DX: Hypothyroidism, unspecified: E03.9

## 2015-10-24 HISTORY — DX: Unspecified osteoarthritis, unspecified site: M19.90

## 2015-10-24 HISTORY — DX: Benign prostatic hyperplasia without lower urinary tract symptoms: N40.0

## 2015-10-24 HISTORY — DX: Post-traumatic stress disorder, unspecified: F43.10

## 2015-10-24 HISTORY — DX: Calculus of kidney: N20.0

## 2015-10-24 HISTORY — DX: Personal history of other diseases of the digestive system: Z87.19

## 2015-10-24 HISTORY — DX: Acute embolism and thrombosis of unspecified deep veins of unspecified lower extremity: I82.409

## 2015-10-24 HISTORY — DX: Bipolar disorder, unspecified: F31.9

## 2015-10-24 HISTORY — DX: Heart failure, unspecified: I50.9

## 2015-10-24 HISTORY — DX: Gastro-esophageal reflux disease without esophagitis: K21.9

## 2015-10-24 HISTORY — DX: Gastric ulcer, unspecified as acute or chronic, without hemorrhage or perforation: K25.9

## 2015-10-24 HISTORY — DX: Atherosclerotic heart disease of native coronary artery without angina pectoris: I25.10

## 2015-10-24 HISTORY — DX: Chronic obstructive pulmonary disease, unspecified: J44.9

## 2015-10-24 HISTORY — DX: Angina pectoris, unspecified: I20.9

## 2015-10-24 LAB — PROTIME-INR
INR: 2.28
Prothrombin Time: 24.9 seconds — ABNORMAL HIGH (ref 11.4–15.0)

## 2015-10-24 LAB — URINALYSIS COMPLETE WITH MICROSCOPIC (ARMC ONLY)
BACTERIA UA: NONE SEEN
Bilirubin Urine: NEGATIVE
GLUCOSE, UA: NEGATIVE mg/dL
Hgb urine dipstick: NEGATIVE
Ketones, ur: NEGATIVE mg/dL
Leukocytes, UA: NEGATIVE
NITRITE: NEGATIVE
PROTEIN: NEGATIVE mg/dL
SPECIFIC GRAVITY, URINE: 1.008 (ref 1.005–1.030)
WBC UA: NONE SEEN WBC/hpf (ref 0–5)
pH: 6 (ref 5.0–8.0)

## 2015-10-24 LAB — BASIC METABOLIC PANEL
ANION GAP: 6 (ref 5–15)
BUN: 15 mg/dL (ref 6–20)
CHLORIDE: 101 mmol/L (ref 101–111)
CO2: 33 mmol/L — AB (ref 22–32)
Calcium: 9.4 mg/dL (ref 8.9–10.3)
Creatinine, Ser: 0.97 mg/dL (ref 0.61–1.24)
GFR calc Af Amer: >60 mL/min (ref >60)
GLUCOSE: 95 mg/dL (ref 65–99)
POTASSIUM: 4.2 mmol/L (ref 3.5–5.1)
Sodium: 140 mmol/L (ref 135–145)

## 2015-10-24 LAB — CBC
HEMATOCRIT: 40.5 % (ref 40.0–52.0)
Hemoglobin: 13.5 g/dL (ref 13.0–18.0)
MCH: 30.7 pg (ref 26.0–34.0)
MCHC: 33.4 g/dL (ref 32.0–36.0)
MCV: 91.8 fL (ref 80.0–100.0)
PLATELETS: 214 10*3/uL (ref 150–440)
RBC: 4.41 MIL/uL (ref 4.40–5.90)
RDW: 15.2 % — ABNORMAL HIGH (ref 11.5–14.5)
WBC: 8.5 10*3/uL (ref 3.8–10.6)

## 2015-10-24 LAB — APTT: APTT: 47 s — AB (ref 24–36)

## 2015-10-24 LAB — SURGICAL PCR SCREEN
MRSA, PCR: NEGATIVE
STAPHYLOCOCCUS AUREUS: NEGATIVE

## 2015-10-24 LAB — ABO/RH: ABO/RH(D): O POS

## 2015-10-24 NOTE — Patient Instructions (Addendum)
  Your procedure is scheduled on: Thursday 11/01/15 Report to Day Surgery. 2ND FLOOR MEDICAL MALL ENTRANCE To find out your arrival time please call [phone] between 1PM - 3PM on Wednesday 10/31/15.  Remember: Instructions that are not followed completely may result in serious medical risk, up to and including death, or upon the discretion of your surgeon and anesthesiologist your surgery may need to be rescheduled.    __X__ 1. Do not eat food or drink liquids after midnight. No gum chewing or hard candies.     __X__ 2. No Alcohol for 24 hours before or after surgery.   ____ 3. Bring all medications with you on the day of surgery if instructed.    __X__ 4. Notify your doctor if there is any change in your medical condition     (cold, fever, infections).     Do not wear jewelry, make-up, hairpins, clips or nail polish.  Do not wear lotions, powders, or perfumes.   Do not shave 48 hours prior to surgery. Men may shave face and neck.  Do not bring valuables to the hospital.    [facility] is not responsible for any belongings or valuables.               Contacts, dentures or bridgework may not be worn into surgery.  Leave your suitcase in the car. After surgery it may be brought to your room.  For patients admitted to the hospital, discharge time is determined by your                treatment team.   Patients discharged the day of surgery will not be allowed to drive home.   Please read over the following fact sheets that you were given:   MRSA Information and Surgical Site Infection Prevention   __X__ Take these medicines the morning of surgery with A SIP OF WATER:    1. DULOXETINE  2. ISOSORBIDE  3. LEVOTHYROXINE  4.METOPROLOL  5. MIRTAZEPINE  6. OMEPRAZOLE  7. LYRICA  8. OXYCONTIN  9. GEODAN  ____ Fleet Enema (as directed)   __X__ Use CHG Soap as directed  __X__ Use inhalers on the day of surgery  ____ Stop metformin 2 days prior to surgery    ____ Take 1/2 of  usual insulin dose the night before surgery and none on the morning of surgery.   __x__ Stop Coumadin/Plavix/aspirin on 5 days prior to surgery  ____ Stop Anti-inflammatories on    ____ Stop supplements until after surgery.    ____ Bring C-Pap to the hospital.

## 2015-11-01 ENCOUNTER — Inpatient Hospital Stay: Payer: Medicaid Other

## 2015-11-01 ENCOUNTER — Inpatient Hospital Stay
Admission: RE | Admit: 2015-11-01 | Discharge: 2015-11-04 | DRG: 470 | Disposition: A | Discharge status: Home Health | Payer: Medicaid Other | Source: Ambulatory Visit | Attending: Orthopedic Surgery | Admitting: Orthopedic Surgery

## 2015-11-01 ENCOUNTER — Inpatient Hospital Stay: Payer: Medicaid Other | Admitting: Certified Registered"

## 2015-11-01 ENCOUNTER — Encounter
Admission: RE | Disposition: A | Discharge status: Home Health | Payer: Self-pay | Source: Ambulatory Visit | Attending: Orthopedic Surgery

## 2015-11-01 ENCOUNTER — Encounter: Payer: Self-pay | Admitting: *Deleted

## 2015-11-01 DIAGNOSIS — E860 Dehydration: Secondary | ICD-10-CM | POA: Diagnosis not present

## 2015-11-01 DIAGNOSIS — Z87442 Personal history of urinary calculi: Secondary | ICD-10-CM

## 2015-11-01 DIAGNOSIS — J441 Chronic obstructive pulmonary disease with (acute) exacerbation: Secondary | ICD-10-CM | POA: Diagnosis present

## 2015-11-01 DIAGNOSIS — I509 Heart failure, unspecified: Secondary | ICD-10-CM | POA: Diagnosis present

## 2015-11-01 DIAGNOSIS — R4182 Altered mental status, unspecified: Secondary | ICD-10-CM | POA: Diagnosis not present

## 2015-11-01 DIAGNOSIS — I959 Hypotension, unspecified: Secondary | ICD-10-CM | POA: Diagnosis present

## 2015-11-01 DIAGNOSIS — Z86010 Personal history of colonic polyps: Secondary | ICD-10-CM | POA: Diagnosis not present

## 2015-11-01 DIAGNOSIS — K219 Gastro-esophageal reflux disease without esophagitis: Secondary | ICD-10-CM | POA: Diagnosis present

## 2015-11-01 DIAGNOSIS — M1711 Unilateral primary osteoarthritis, right knee: Secondary | ICD-10-CM | POA: Diagnosis present

## 2015-11-01 DIAGNOSIS — I10 Essential (primary) hypertension: Secondary | ICD-10-CM | POA: Diagnosis present

## 2015-11-01 DIAGNOSIS — Z5181 Encounter for therapeutic drug level monitoring: Secondary | ICD-10-CM

## 2015-11-01 DIAGNOSIS — Z882 Allergy status to sulfonamides status: Secondary | ICD-10-CM

## 2015-11-01 DIAGNOSIS — F4310 Post-traumatic stress disorder, unspecified: Secondary | ICD-10-CM | POA: Diagnosis present

## 2015-11-01 DIAGNOSIS — E785 Hyperlipidemia, unspecified: Secondary | ICD-10-CM | POA: Diagnosis present

## 2015-11-01 DIAGNOSIS — R5383 Other fatigue: Secondary | ICD-10-CM | POA: Diagnosis present

## 2015-11-01 DIAGNOSIS — D62 Acute posthemorrhagic anemia: Secondary | ICD-10-CM | POA: Diagnosis not present

## 2015-11-01 DIAGNOSIS — E039 Hypothyroidism, unspecified: Secondary | ICD-10-CM | POA: Diagnosis present

## 2015-11-01 DIAGNOSIS — F319 Bipolar disorder, unspecified: Secondary | ICD-10-CM | POA: Diagnosis present

## 2015-11-01 DIAGNOSIS — I252 Old myocardial infarction: Secondary | ICD-10-CM | POA: Diagnosis not present

## 2015-11-01 DIAGNOSIS — N400 Enlarged prostate without lower urinary tract symptoms: Secondary | ICD-10-CM | POA: Diagnosis present

## 2015-11-01 DIAGNOSIS — I110 Hypertensive heart disease with heart failure: Secondary | ICD-10-CM | POA: Diagnosis present

## 2015-11-01 DIAGNOSIS — Z7901 Long term (current) use of anticoagulants: Secondary | ICD-10-CM | POA: Diagnosis not present

## 2015-11-01 DIAGNOSIS — I2510 Atherosclerotic heart disease of native coronary artery without angina pectoris: Secondary | ICD-10-CM | POA: Diagnosis present

## 2015-11-01 DIAGNOSIS — Y92230 Patient room in hospital as the place of occurrence of the external cause: Secondary | ICD-10-CM | POA: Diagnosis not present

## 2015-11-01 DIAGNOSIS — R000 Tachycardia, unspecified: Secondary | ICD-10-CM

## 2015-11-01 DIAGNOSIS — F3170 Bipolar disorder, currently in remission, most recent episode unspecified: Secondary | ICD-10-CM | POA: Diagnosis present

## 2015-11-01 DIAGNOSIS — Z886 Allergy status to analgesic agent status: Secondary | ICD-10-CM

## 2015-11-01 DIAGNOSIS — Z91030 Bee allergy status: Secondary | ICD-10-CM | POA: Diagnosis not present

## 2015-11-01 DIAGNOSIS — D72829 Elevated white blood cell count, unspecified: Secondary | ICD-10-CM | POA: Diagnosis not present

## 2015-11-01 DIAGNOSIS — F17210 Nicotine dependence, cigarettes, uncomplicated: Secondary | ICD-10-CM | POA: Diagnosis present

## 2015-11-01 DIAGNOSIS — Z96651 Presence of right artificial knee joint: Secondary | ICD-10-CM

## 2015-11-01 DIAGNOSIS — Z8711 Personal history of peptic ulcer disease: Secondary | ICD-10-CM

## 2015-11-01 DIAGNOSIS — Z96653 Presence of artificial knee joint, bilateral: Secondary | ICD-10-CM

## 2015-11-01 HISTORY — PX: TOTAL KNEE ARTHROPLASTY: SHX125

## 2015-11-01 HISTORY — DX: Essential (primary) hypertension: I10

## 2015-11-01 LAB — PROTIME-INR
INR: 1.27
PROTHROMBIN TIME: 16 s — AB (ref 11.4–15.0)

## 2015-11-01 SURGERY — ARTHROPLASTY, KNEE, TOTAL
Anesthesia: Spinal | Laterality: Right

## 2015-11-01 MED ORDER — LIDOCAINE HCL (PF) 2 % IJ SOLN
INTRAMUSCULAR | Status: DC | PRN
  Administered 2015-11-01: 50 mg

## 2015-11-01 MED ORDER — PROPOFOL 500 MG/50ML IV EMUL
INTRAVENOUS | Status: DC | PRN
  Administered 2015-11-01: 50 ug/kg/min via INTRAVENOUS

## 2015-11-01 MED ORDER — ACETAMINOPHEN 500 MG PO TABS
1000.0000 mg | ORAL_TABLET | Freq: Four times a day (QID) | ORAL | Status: AC
  Administered 2015-11-01 – 2015-11-02 (×4): 1000 mg via ORAL
  Filled 2015-11-01 (×4): qty 2

## 2015-11-01 MED ORDER — FINASTERIDE 5 MG PO TABS
5.0000 mg | ORAL_TABLET | Freq: Every day | ORAL | Status: DC
  Administered 2015-11-01 – 2015-11-04 (×4): 5 mg via ORAL
  Filled 2015-11-01 (×4): qty 1

## 2015-11-01 MED ORDER — ALUM & MAG HYDROXIDE-SIMETH 200-200-20 MG/5ML PO SUSP: 30.0000 mL | ORAL | Status: DC | PRN

## 2015-11-01 MED ORDER — PRAVASTATIN SODIUM 20 MG PO TABS
80.0000 mg | ORAL_TABLET | Freq: Every day | ORAL | Status: DC
  Administered 2015-11-01 – 2015-11-03 (×3): 80 mg via ORAL
  Filled 2015-11-01 (×3): qty 4

## 2015-11-01 MED ORDER — METOPROLOL SUCCINATE ER 50 MG PO TB24
50.0000 mg | ORAL_TABLET | Freq: Every day | ORAL | Status: DC
Start: 2015-11-02 — End: 2015-11-02
  Administered 2015-11-02: 50 mg via ORAL
  Filled 2015-11-01: qty 1

## 2015-11-01 MED ORDER — CLINDAMYCIN PHOSPHATE 600 MG/50ML IV SOLN
600.0000 mg | Freq: Once | INTRAVENOUS | Status: AC
  Administered 2015-11-01: 600 mg via INTRAVENOUS

## 2015-11-01 MED ORDER — ONDANSETRON HCL 4 MG PO TABS: 4.0000 mg | ORAL_TABLET | Freq: Four times a day (QID) | ORAL | Status: DC | PRN

## 2015-11-01 MED ORDER — DULOXETINE HCL 60 MG PO CPEP
60.0000 mg | ORAL_CAPSULE | Freq: Two times a day (BID) | ORAL | Status: DC
  Administered 2015-11-01 – 2015-11-04 (×5): 60 mg via ORAL
  Filled 2015-11-01 (×5): qty 1

## 2015-11-01 MED ORDER — MIDAZOLAM HCL 5 MG/5ML IJ SOLN
INTRAMUSCULAR | Status: DC | PRN
  Administered 2015-11-01: 2 mg via INTRAVENOUS

## 2015-11-01 MED ORDER — MENTHOL 3 MG MT LOZG: 1.0000 | LOZENGE | OROMUCOSAL | Status: DC | PRN

## 2015-11-01 MED ORDER — SODIUM CHLORIDE 0.9 % IJ SOLN
INTRAMUSCULAR | Status: AC
  Filled 2015-11-01: qty 50

## 2015-11-01 MED ORDER — DOCUSATE SODIUM 100 MG PO CAPS
100.0000 mg | ORAL_CAPSULE | Freq: Two times a day (BID) | ORAL | Status: DC
  Administered 2015-11-01 – 2015-11-04 (×7): 100 mg via ORAL
  Filled 2015-11-01 (×7): qty 1

## 2015-11-01 MED ORDER — METHOCARBAMOL 500 MG PO TABS: 500.0000 mg | ORAL_TABLET | Freq: Four times a day (QID) | ORAL | Status: DC | PRN

## 2015-11-01 MED ORDER — LORATADINE 10 MG PO TABS
10.0000 mg | ORAL_TABLET | Freq: Every day | ORAL | Status: DC
  Administered 2015-11-02 – 2015-11-04 (×3): 10 mg via ORAL
  Filled 2015-11-01 (×3): qty 1

## 2015-11-01 MED ORDER — CLONAZEPAM 1 MG PO TABS
2.0000 mg | ORAL_TABLET | Freq: Every day | ORAL | Status: DC
  Administered 2015-11-01: 2 mg via ORAL
  Filled 2015-11-01: qty 2

## 2015-11-01 MED ORDER — ISOSORBIDE MONONITRATE ER 30 MG PO TB24
30.0000 mg | ORAL_TABLET | Freq: Every day | ORAL | Status: DC
  Administered 2015-11-02 – 2015-11-04 (×3): 30 mg via ORAL
  Filled 2015-11-01 (×3): qty 1

## 2015-11-01 MED ORDER — HYDROMORPHONE HCL 1 MG/ML IJ SOLN
1.0000 mg | INTRAMUSCULAR | Status: DC | PRN
  Administered 2015-11-01: 1 mg via INTRAVENOUS
  Filled 2015-11-01: qty 1

## 2015-11-01 MED ORDER — OXYCODONE HCL 5 MG PO TABS
10.0000 mg | ORAL_TABLET | ORAL | Status: DC | PRN
  Administered 2015-11-02: 15 mg via ORAL
  Filled 2015-11-01: qty 3

## 2015-11-01 MED ORDER — ZIPRASIDONE HCL 20 MG PO CAPS
60.0000 mg | ORAL_CAPSULE | Freq: Two times a day (BID) | ORAL | Status: DC
Start: 2015-11-01 — End: 2015-11-04
  Administered 2015-11-01 – 2015-11-04 (×5): 60 mg via ORAL
  Filled 2015-11-01 (×5): qty 3

## 2015-11-01 MED ORDER — ALBUTEROL SULFATE (2.5 MG/3ML) 0.083% IN NEBU: 2.5000 mg | INHALATION_SOLUTION | Freq: Four times a day (QID) | RESPIRATORY_TRACT | Status: DC | PRN

## 2015-11-01 MED ORDER — LACTATED RINGERS IV BOLUS (SEPSIS)
250.0000 mL | Freq: Once | INTRAVENOUS | Status: AC
  Administered 2015-11-01: 250 mL via INTRAVENOUS

## 2015-11-01 MED ORDER — PANTOPRAZOLE SODIUM 40 MG PO TBEC
40.0000 mg | DELAYED_RELEASE_TABLET | Freq: Every day | ORAL | Status: DC
  Administered 2015-11-02 – 2015-11-04 (×3): 40 mg via ORAL
  Filled 2015-11-01 (×3): qty 1

## 2015-11-01 MED ORDER — TETRACAINE HCL 1 % IJ SOLN
INTRAMUSCULAR | Status: AC
Start: 2015-11-01 — End: 2015-11-01
  Filled 2015-11-01: qty 2

## 2015-11-01 MED ORDER — LEVOTHYROXINE SODIUM 50 MCG PO TABS
50.0000 ug | ORAL_TABLET | Freq: Every day | ORAL | Status: DC
  Administered 2015-11-02 – 2015-11-04 (×3): 50 ug via ORAL
  Filled 2015-11-01 (×4): qty 1

## 2015-11-01 MED ORDER — METHOCARBAMOL 1000 MG/10ML IJ SOLN: 500.0000 mg | Freq: Four times a day (QID) | INTRAVENOUS | Status: DC | PRN

## 2015-11-01 MED ORDER — CLINDAMYCIN PHOSPHATE 600 MG/50ML IV SOLN
600.0000 mg | Freq: Three times a day (TID) | INTRAVENOUS | Status: AC
  Administered 2015-11-02 (×2): 600 mg via INTRAVENOUS
  Filled 2015-11-01 (×2): qty 50

## 2015-11-01 MED ORDER — MIRTAZAPINE 15 MG PO TABS
15.0000 mg | ORAL_TABLET | Freq: Every day | ORAL | Status: DC
  Administered 2015-11-01 – 2015-11-02 (×2): 15 mg via ORAL
  Filled 2015-11-01 (×2): qty 1

## 2015-11-01 MED ORDER — CEFAZOLIN SODIUM-DEXTROSE 2-3 GM-% IV SOLR
2.0000 g | Freq: Four times a day (QID) | INTRAVENOUS | Status: AC
  Administered 2015-11-01 (×2): 2 g via INTRAVENOUS
  Filled 2015-11-01 (×2): qty 50

## 2015-11-01 MED ORDER — SODIUM CHLORIDE 0.9 % IV SOLN
INTRAVENOUS | Status: DC | PRN
  Administered 2015-11-01: 120 mL

## 2015-11-01 MED ORDER — ONDANSETRON HCL 4 MG/2ML IJ SOLN: 4.0000 mg | Freq: Once | INTRAMUSCULAR | Status: DC | PRN

## 2015-11-01 MED ORDER — OXYCODONE HCL ER 10 MG PO T12A
10.0000 mg | EXTENDED_RELEASE_TABLET | Freq: Two times a day (BID) | ORAL | Status: DC
  Administered 2015-11-01 – 2015-11-02 (×2): 10 mg via ORAL
  Filled 2015-11-01 (×2): qty 1

## 2015-11-01 MED ORDER — FENTANYL CITRATE (PF) 100 MCG/2ML IJ SOLN
INTRAMUSCULAR | Status: AC
  Administered 2015-11-01: 25 ug via INTRAVENOUS
  Filled 2015-11-01: qty 2

## 2015-11-01 MED ORDER — WARFARIN SODIUM 2 MG PO TABS
4.0000 mg | ORAL_TABLET | Freq: Every day | ORAL | Status: DC
  Administered 2015-11-01 – 2015-11-03 (×3): 4 mg via ORAL
  Filled 2015-11-01 (×3): qty 2

## 2015-11-01 MED ORDER — METOCLOPRAMIDE HCL 5 MG PO TABS
5.0000 mg | ORAL_TABLET | Freq: Three times a day (TID) | ORAL | Status: DC | PRN
  Administered 2015-11-03: 5 mg via ORAL
  Filled 2015-11-01: qty 1

## 2015-11-01 MED ORDER — LACTATED RINGERS IV SOLN
INTRAVENOUS | Status: DC
  Administered 2015-11-01 (×3): via INTRAVENOUS

## 2015-11-01 MED ORDER — WARFARIN - PHARMACIST DOSING INPATIENT: Freq: Every day | Status: DC

## 2015-11-01 MED ORDER — TRAZODONE HCL 100 MG PO TABS
200.0000 mg | ORAL_TABLET | Freq: Every day | ORAL | Status: DC
  Administered 2015-11-01: 200 mg via ORAL
  Filled 2015-11-01: qty 2

## 2015-11-01 MED ORDER — FENTANYL CITRATE (PF) 100 MCG/2ML IJ SOLN
25.0000 ug | INTRAMUSCULAR | Status: DC | PRN
  Administered 2015-11-01 (×4): 25 ug via INTRAVENOUS

## 2015-11-01 MED ORDER — DIPHENHYDRAMINE HCL 12.5 MG/5ML PO ELIX
12.5000 mg | ORAL_SOLUTION | ORAL | Status: DC | PRN
Start: 2015-11-01 — End: 2015-11-04

## 2015-11-01 MED ORDER — BISACODYL 5 MG PO TBEC
5.0000 mg | DELAYED_RELEASE_TABLET | Freq: Every day | ORAL | Status: DC | PRN
  Administered 2015-11-03: 5 mg via ORAL
  Filled 2015-11-01: qty 1

## 2015-11-01 MED ORDER — BUPIVACAINE-EPINEPHRINE (PF) 0.25% -1:200000 IJ SOLN
INTRAMUSCULAR | Status: AC
  Filled 2015-11-01: qty 60

## 2015-11-01 MED ORDER — NEOMYCIN-POLYMYXIN B GU 40-200000 IR SOLN
Status: AC
  Filled 2015-11-01: qty 20

## 2015-11-01 MED ORDER — TETRACAINE HCL 1 % IJ SOLN
INTRAMUSCULAR | Status: DC | PRN
  Administered 2015-11-01: 6 mg via INTRASPINAL

## 2015-11-01 MED ORDER — NITROGLYCERIN 0.4 MG SL SUBL: 0.4000 mg | SUBLINGUAL_TABLET | SUBLINGUAL | Status: DC | PRN

## 2015-11-01 MED ORDER — CEFAZOLIN SODIUM-DEXTROSE 2-3 GM-% IV SOLR
2.0000 g | Freq: Once | INTRAVENOUS | Status: AC
  Administered 2015-11-01: 2 g via INTRAVENOUS

## 2015-11-01 MED ORDER — ACETAMINOPHEN 650 MG RE SUPP: 650.0000 mg | Freq: Four times a day (QID) | RECTAL | Status: DC | PRN

## 2015-11-01 MED ORDER — SODIUM CHLORIDE 0.9 % IV SOLN
INTRAVENOUS | Status: DC
  Administered 2015-11-01 – 2015-11-02 (×3): via INTRAVENOUS

## 2015-11-01 MED ORDER — MAGNESIUM HYDROXIDE 400 MG/5ML PO SUSP: 30.0000 mL | Freq: Every day | ORAL | Status: DC | PRN

## 2015-11-01 MED ORDER — MORPHINE SULFATE (PF) 4 MG/ML IV SOLN
INTRAVENOUS | Status: AC
  Filled 2015-11-01: qty 1

## 2015-11-01 MED ORDER — PHENOL 1.4 % MT LIQD
1.0000 | OROMUCOSAL | Status: DC | PRN
Start: 2015-11-01 — End: 2015-11-04

## 2015-11-01 MED ORDER — NEOMYCIN-POLYMYXIN B GU 40-200000 IR SOLN
Status: DC | PRN
  Administered 2015-11-01: 16 mL

## 2015-11-01 MED ORDER — TAMSULOSIN HCL 0.4 MG PO CAPS
0.4000 mg | ORAL_CAPSULE | Freq: Every day | ORAL | Status: DC
Start: 2015-11-01 — End: 2015-11-02
  Administered 2015-11-01 – 2015-11-02 (×2): 0.4 mg via ORAL
  Filled 2015-11-01 (×2): qty 1

## 2015-11-01 MED ORDER — POTASSIUM CHLORIDE CRYS ER 10 MEQ PO TBCR
10.0000 meq | EXTENDED_RELEASE_TABLET | Freq: Every day | ORAL | Status: DC
  Administered 2015-11-02 – 2015-11-04 (×3): 10 meq via ORAL
  Filled 2015-11-01 (×3): qty 1

## 2015-11-01 MED ORDER — BUPIVACAINE HCL (PF) 0.5 % IJ SOLN
INTRAMUSCULAR | Status: DC | PRN
  Administered 2015-11-01: 2 mL via INTRATHECAL

## 2015-11-01 MED ORDER — BUPIVACAINE LIPOSOME 1.3 % IJ SUSP
INTRAMUSCULAR | Status: AC
  Filled 2015-11-01: qty 20

## 2015-11-01 MED ORDER — PREGABALIN 25 MG PO CAPS
25.0000 mg | ORAL_CAPSULE | Freq: Three times a day (TID) | ORAL | Status: DC
  Administered 2015-11-01 – 2015-11-02 (×3): 25 mg via ORAL
  Filled 2015-11-01 (×3): qty 1

## 2015-11-01 MED ORDER — ONDANSETRON HCL 4 MG/2ML IJ SOLN: 4.0000 mg | Freq: Four times a day (QID) | INTRAMUSCULAR | Status: DC | PRN

## 2015-11-01 MED ORDER — GLYCOPYRROLATE 0.2 MG/ML IJ SOLN
INTRAMUSCULAR | Status: DC | PRN
  Administered 2015-11-01: 0.2 mg via INTRAVENOUS

## 2015-11-01 MED ORDER — KETOROLAC TROMETHAMINE 15 MG/ML IJ SOLN
15.0000 mg | Freq: Four times a day (QID) | INTRAMUSCULAR | Status: AC
  Administered 2015-11-01 – 2015-11-02 (×4): 15 mg via INTRAVENOUS
  Filled 2015-11-01 (×4): qty 1

## 2015-11-01 MED ORDER — ACETAMINOPHEN 325 MG PO TABS
650.0000 mg | ORAL_TABLET | Freq: Four times a day (QID) | ORAL | Status: DC | PRN
  Administered 2015-11-03: 650 mg via ORAL
  Filled 2015-11-01 (×2): qty 2

## 2015-11-01 MED ORDER — METOCLOPRAMIDE HCL 5 MG/ML IJ SOLN: 5.0000 mg | Freq: Three times a day (TID) | INTRAMUSCULAR | Status: DC | PRN

## 2015-11-01 MED ORDER — FUROSEMIDE 20 MG PO TABS
20.0000 mg | ORAL_TABLET | Freq: Every day | ORAL | Status: DC
  Administered 2015-11-02 – 2015-11-04 (×3): 20 mg via ORAL
  Filled 2015-11-01 (×3): qty 1

## 2015-11-01 MED ORDER — FENTANYL CITRATE (PF) 100 MCG/2ML IJ SOLN
INTRAMUSCULAR | Status: DC | PRN
  Administered 2015-11-01 (×2): 25 ug via INTRAVENOUS
  Administered 2015-11-01: 50 ug via INTRAVENOUS

## 2015-11-01 SURGICAL SUPPLY — 64 items
AUTOTRANSFUS HAS 1/8 (MISCELLANEOUS) ×3
BAG DECANTER FOR FLEXI CONT (MISCELLANEOUS) ×3 IMPLANT
BLADE DEBAKEY 8.0 (BLADE) ×2 IMPLANT
BLADE DEBAKEY 8.0MM (BLADE) ×1
BLADE SAW 1 (BLADE) ×3 IMPLANT
BLADE SAW 1/2 (BLADE) ×3 IMPLANT
BLADE SURG 15 STRL LF DISP TIS (BLADE) ×1 IMPLANT
BLADE SURG 15 STRL SS (BLADE) ×2
CANISTER SUCT 1200ML W/VALVE (MISCELLANEOUS) ×6 IMPLANT
CAP KNEE TOTAL 3 SIGMA ×3 IMPLANT
CATH TRAY METER 16FR LF (MISCELLANEOUS) ×3 IMPLANT
CEMENT HV SMART SET (Cement) ×6 IMPLANT
CNTNR SPEC 2.5X3XGRAD LEK (MISCELLANEOUS) ×1
CONT SPEC 4OZ STER OR WHT (MISCELLANEOUS) ×2
CONTAINER SPEC 2.5X3XGRAD LEK (MISCELLANEOUS) ×1 IMPLANT
COOLER POLAR GLACIER W/PUMP (MISCELLANEOUS) ×3 IMPLANT
COVER LIGHT HANDLE STERIS (MISCELLANEOUS) ×3 IMPLANT
DRAPE IMP U-DRAPE 54X76 (DRAPES) ×3 IMPLANT
DRAPE INCISE IOBAN 66X60 STRL (DRAPES) ×3 IMPLANT
DRAPE SHEET LG 3/4 BI-LAMINATE (DRAPES) ×3 IMPLANT
DRAPE SURG 17X11 SM STRL (DRAPES) ×6 IMPLANT
DRSG OPSITE POSTOP 4X12 (GAUZE/BANDAGES/DRESSINGS) ×3 IMPLANT
DRSG OPSITE POSTOP 4X14 (GAUZE/BANDAGES/DRESSINGS) IMPLANT
DURAPREP 26ML APPLICATOR (WOUND CARE) ×9 IMPLANT
ELECT REM PT RETURN 9FT ADLT (ELECTROSURGICAL) ×3
ELECTRODE REM PT RTRN 9FT ADLT (ELECTROSURGICAL) ×1 IMPLANT
GAUZE SPONGE 4X4 12PLY STRL (GAUZE/BANDAGES/DRESSINGS) ×3 IMPLANT
GLOVE BIOGEL PI IND STRL 9 (GLOVE) ×1 IMPLANT
GLOVE BIOGEL PI INDICATOR 9 (GLOVE) ×2
GLOVE SURG 9.0 ORTHO LTXF (GLOVE) ×9 IMPLANT
GOWN STRL REUS TWL 2XL XL LVL4 (GOWN DISPOSABLE) ×3 IMPLANT
GOWN STRL REUS W/ TWL LRG LVL3 (GOWN DISPOSABLE) ×4 IMPLANT
GOWN STRL REUS W/TWL LRG LVL3 (GOWN DISPOSABLE) ×8
GOWN STRL REUS W/TWL XL LVL4 (GOWN DISPOSABLE) ×3 IMPLANT
HANDPIECE SUCTION TUBG SURGILV (MISCELLANEOUS) ×3 IMPLANT
IMMBOLIZER KNEE 19 BLUE UNIV (SOFTGOODS) ×3 IMPLANT
IV NS 1000ML (IV SOLUTION) ×4
IV NS 1000ML BAXH (IV SOLUTION) ×2 IMPLANT
IV NS 100ML SINGLE PACK (IV SOLUTION) ×3 IMPLANT
KIT RM TURNOVER STRD PROC AR (KITS) ×3 IMPLANT
NDL SAFETY 18GX1.5 (NEEDLE) ×3 IMPLANT
NDL SAFETY 22GX1.5 (NEEDLE) ×3 IMPLANT
NEEDLE SPNL 20GX3.5 QUINCKE YW (NEEDLE) ×3 IMPLANT
NS IRRIG 1000ML POUR BTL (IV SOLUTION) ×3 IMPLANT
PACK TOTAL KNEE (MISCELLANEOUS) ×3 IMPLANT
PAD PREP 24X41 OB/GYN DISP (PERSONAL CARE ITEMS) ×3 IMPLANT
PAD WRAPON POLAR KNEE (MISCELLANEOUS) ×1 IMPLANT
SOL .9 NS 3000ML IRR  AL (IV SOLUTION)
SOL .9 NS 3000ML IRR UROMATIC (IV SOLUTION) IMPLANT
SPONGE DRAIN TRACH 4X4 STRL 2S (GAUZE/BANDAGES/DRESSINGS) ×3 IMPLANT
SPONGE LAP 18X18 5 PK (GAUZE/BANDAGES/DRESSINGS) ×3 IMPLANT
STAPLER SKIN PROX 35W (STAPLE) ×3 IMPLANT
SUCTION FRAZIER HANDLE 10FR (MISCELLANEOUS) ×2
SUCTION TUBE FRAZIER 10FR DISP (MISCELLANEOUS) ×1 IMPLANT
SUT ETHIBOND NAB CT1 #1 30IN (SUTURE) ×6 IMPLANT
SUT MNCRL AB 3-0 PS2 27 (SUTURE) ×6 IMPLANT
SUT VIC AB 0 CT1 36 (SUTURE) ×3 IMPLANT
SUT VIC AB 2-0 CT1 (SUTURE) ×6 IMPLANT
SYR 20CC LL (SYRINGE) ×3 IMPLANT
SYR 50ML LL SCALE MARK (SYRINGE) ×6 IMPLANT
SYSTEM AUTOTRANSFUS DUAL TROCR (MISCELLANEOUS) ×1 IMPLANT
TOWER CARTRIDGE SMART MIX (DISPOSABLE) ×3 IMPLANT
TUBE SUCT KAM VAC (TUBING) ×3 IMPLANT
WRAPON POLAR PAD KNEE (MISCELLANEOUS) ×3

## 2015-11-01 NOTE — Anesthesia Postprocedure Evaluation (Signed)
Anesthesia Post Note  Patient: Brett Fernandez  Procedure(s) Performed: Procedure(s) (LRB): TOTAL KNEE ARTHROPLASTY (Right)  Anesthesia Type: Spinal Level of consciousness: awake and alert Pain management: satisfactory to patient Vital Signs Assessment: post-procedure vital signs reviewed and stable Respiratory status: respiratory function stable Cardiovascular status: stable Anesthetic complications: no    Last Vitals:  Filed Vitals:   11/01/15 1025 11/01/15 1040  BP: 103/79 123/82  Pulse: 94 89  Temp: 36.3 C   Resp: 20 12    Last Pain:  Filed Vitals:   11/01/15 1046  PainSc: 0-No pain                 VAN STAVEREN,Leston Schueller

## 2015-11-01 NOTE — Progress Notes (Signed)
  Subjective:  POST-OP CHECK:  Patient reports pain as marked.  Patient is seen in his room with his nurse, Bhavini.  Patient's friend is at the bedside. Patient appears drowsy but arousable. He answers questions appropriately and follow commands. Patient is complaining about his foot pumps. He states they cause pain.  Objective:   VITALS:   Filed Vitals:   11/01/15 1325 11/01/15 1415 11/01/15 1418 11/01/15 1449  BP:    132/88  Pulse:  61 65 73  Temp:  97 F (36.1 C)  97.5 F (36.4 C)  TempSrc: Temporal   Oral  Resp:  10 18 18   SpO2:  99% 98% 95%    PHYSICAL EXAM:  Right lower extremity: Patient's right upper extremity is in a knee immobilizer. His TED stockings and foot pumps in place. Patient's Autovac drain is in place. He has palpable pedal pulses. He can flex and extend his toes and dorsiflex and plantarflex his ankle. His dressing is clean dry and intact.   LABS  Results for orders placed or performed during the hospital encounter of 11/01/15 (from the past 24 hour(s))  Protime-INR     Status: Abnormal   Collection Time: 11/01/15  6:32 AM  Result Value Ref Range   Prothrombin Time 16.0 (H) 11.4 - 15.0 seconds   INR 1.27   Prepare RBC     Status: None   Collection Time: 11/01/15  6:33 AM  Result Value Ref Range   Order Confirmation ORDER PROCESSED BY BLOOD BANK     Dg Knee 1-2 Views Right  11/01/2015  CLINICAL DATA:  Post total right knee replacement EXAM: RIGHT KNEE - 1-2 VIEW COMPARISON:  03/21/2010 FINDINGS: Two portable views of the right knee submitted. There is right knee prosthesis with anatomic alignment. Postsurgical changes are noted with midline anterior skin staples. Postsurgical drain in place. IMPRESSION: Right knee prosthesis with anatomic alignment. Postsurgical changes are noted. Electronically Signed   By: 05/22/2010 M.D.   On: 11/01/2015 11:43    Assessment/Plan: Day of Surgery   Active Problems:   History of total right knee replacement  I  reviewed the patient's postoperative x-ray. Total knee arthroplasty components are well positioned. There is no evidence of fracture or dislocation. Patient will complete 24 hours postop antibiotics. His Foley catheter will be removed tomorrow. Patient will begin physical and occupational therapy in the morning. He is partial weightbearing on the right lower extremity. He has a TENS unit and Polar Care sleeve to help reduce pain. Patient will be encouraged to use incentive spirometer while awake. He is restarted on Coumadin tonight. He takes 4 mg daily at baseline. Labs will be rechecked in the morning.    11/03/2015 , MD 11/01/2015, 2:52 PM

## 2015-11-01 NOTE — Progress Notes (Signed)
PT Cancellation Note  Patient Details Name: Brett Fernandez MRN: [id number] DOB: 1960/11/18   Cancelled Treatment:    Reason Eval/Treat Not Completed: Other (comment). Per discussion with RN and written MD note, therapy will perform evaluation next date, 11/02/15 as pt has low BP at this time.   Yuliza Cara 11/01/2015, 3:17 PM  Elizabeth Palau, PT, DPT [phone]

## 2015-11-01 NOTE — [place] FL2 (Signed)
[place] MEDICAID FL2 LEVEL OF CARE SCREENING TOOL     IDENTIFICATION  Patient Name: Brett Fernandez Birthdate: [birth year] Sex: male Admission Date (Current Location): 11/01/2015  [facility] and [place] Number:  [address]  ([id number] [id number]) Facility and Address:  [facility], [address], [place], [place] [address]      Provider Number: [id number]  Attending Physician Name and Address:  Juanell Fairly, MD  Relative Name and Phone Number:       Current Level of Care: Hospital Recommended Level of Care: Skilled Nursing Facility Prior Approval Number:    Date Approved/Denied:   PASRR Number:  ( [id number] )  Discharge Plan: SNF    Current Diagnoses: Patient Active Problem List   Diagnosis Date Noted  . History of total right knee replacement 11/01/2015   DJD (degenerative joint disease)    Thyroid disease    Chronic pain    Bipolar disorder (CMS-HCC)    Post traumatic stress disorder (PTSD)    Kidney stones    DVT (deep venous thrombosis) (CMS-HCC)    Hiatal hernia    CAD (coronary artery disease)    Multiple gastric ulcers    BPH (benign prostatic hypertrophy)    COPD (chronic obstructive pulmonary disease) , unspecified (CMS-HCC)    Hyperlipidemia, unspecified        Orientation RESPIRATION BLADDER Height & Weight     Self, Time, Situation, Place  O2 (2 Liters Oxygen ) Continent Weight:   Height:     BEHAVIORAL SYMPTOMS/MOOD NEUROLOGICAL BOWEL NUTRITION STATUS   (none )  (none ) Continent Diet (Regular Diet )  AMBULATORY STATUS COMMUNICATION OF NEEDS Skin   Extensive Assist Verbally Surgical wounds (Incision: Right Knee )                       Personal Care Assistance Level of Assistance  Bathing, Feeding, Dressing Bathing Assistance: Limited assistance Feeding assistance: Independent Dressing Assistance: Limited assistance     Functional Limitations Info  Sight, Hearing, Speech Sight Info:  Adequate Hearing Info: Adequate Speech Info: Adequate    SPECIAL CARE FACTORS FREQUENCY  PT (By licensed PT)     PT Frequency:  (2-3)              Contractures      Additional Factors Info  Code Status, Allergies Code Status Info:  (Full Code. ) Allergies Info:  (Bee Venom, Aspirin, Sulfa Antibiotics)           Current Medications (11/01/2015):  This is the current hospital active medication list Current Facility-Administered Medications  Medication Dose Route Frequency Provider Last Rate Last Dose  . 0.9 %  sodium chloride infusion   Intravenous Continuous Juanell Fairly, MD 75 mL/hr at 11/01/15 1507    . acetaminophen (TYLENOL) tablet 650 mg  650 mg Oral Q6H PRN Juanell Fairly, MD       Or  . acetaminophen (TYLENOL) suppository 650 mg  650 mg Rectal Q6H PRN Juanell Fairly, MD      . acetaminophen (TYLENOL) tablet 1,000 mg  1,000 mg Oral 4 times per day Juanell Fairly, MD      . albuterol (PROVENTIL) (2.5 MG/3ML) 0.083% nebulizer solution 2.5 mg  2.5 mg Inhalation Q6H PRN Juanell Fairly, MD      . alum & mag hydroxide-simeth (MAALOX/MYLANTA) 200-200-20 MG/5ML suspension 30 mL  30 mL Oral Q4H PRN Juanell Fairly, MD      . bisacodyl (DULCOLAX) EC tablet 5 mg  5  mg Oral Daily PRN Juanell Fairly, MD      . ceFAZolin (ANCEF) IVPB 2 g/50 mL premix  2 g Intravenous Q6H Juanell Fairly, MD      . clonazePAM Scarlette Calico) tablet 2 mg  2 mg Oral QHS Juanell Fairly, MD      . diphenhydrAMINE (BENADRYL) 12.5 MG/5ML elixir 12.5-25 mg  12.5-25 mg Oral Q4H PRN Juanell Fairly, MD      . docusate sodium (COLACE) capsule 100 mg  100 mg Oral BID Juanell Fairly, MD      . DULoxetine (CYMBALTA) DR capsule 60 mg  60 mg Oral BID Juanell Fairly, MD      . finasteride (PROSCAR) tablet 5 mg  5 mg Oral Daily Juanell Fairly, MD      . furosemide (LASIX) tablet 20 mg  20 mg Oral Daily Juanell Fairly, MD      . HYDROmorphone (DILAUDID) injection 1 mg  1 mg Intravenous Q2H PRN Juanell Fairly, MD      . Melene Muller ON 11/02/2015] isosorbide mononitrate (IMDUR) 24 hr tablet 30 mg  30 mg Oral Daily Juanell Fairly, MD      . ketorolac (TORADOL) 15 MG/ML injection 15 mg  15 mg Intravenous 4 times per day Juanell Fairly, MD      . Melene Muller ON 11/02/2015] levothyroxine (SYNTHROID, LEVOTHROID) tablet 50 mcg  50 mcg Oral QAC breakfast Juanell Fairly, MD      . loratadine (CLARITIN) tablet 10 mg  10 mg Oral Daily Juanell Fairly, MD      . magnesium hydroxide (MILK OF MAGNESIA) suspension 30 mL  30 mL Oral Daily PRN Juanell Fairly, MD      . menthol-cetylpyridinium (CEPACOL) lozenge 3 mg  1 lozenge Oral PRN Juanell Fairly, MD       Or  . phenol (CHLORASEPTIC) mouth spray 1 spray  1 spray Mouth/Throat PRN Juanell Fairly, MD      . methocarbamol (ROBAXIN) tablet 500 mg  500 mg Oral Q6H PRN Juanell Fairly, MD       Or  . methocarbamol (ROBAXIN) 500 mg in dextrose 5 % 50 mL IVPB  500 mg Intravenous Q6H PRN Juanell Fairly, MD      . metoCLOPramide (REGLAN) tablet 5-10 mg  5-10 mg Oral Q8H PRN Juanell Fairly, MD       Or  . metoCLOPramide (REGLAN) injection 5-10 mg  5-10 mg Intravenous Q8H PRN Juanell Fairly, MD      . Melene Muller ON 11/02/2015] metoprolol succinate (TOPROL-XL) 24 hr tablet 50 mg  50 mg Oral Daily Juanell Fairly, MD      . mirtazapine (REMERON) tablet 15 mg  15 mg Oral Daily Juanell Fairly, MD      . nitroGLYCERIN (NITROSTAT) SL tablet 0.4 mg  0.4 mg Sublingual Q5 min PRN Juanell Fairly, MD      . ondansetron [facility]) tablet 4 mg  4 mg Oral Q6H PRN Juanell Fairly, MD       Or  . ondansetron [facility]) injection 4 mg  4 mg Intravenous Q6H PRN Juanell Fairly, MD      . oxyCODONE (Oxy IR/ROXICODONE) immediate release tablet 10-15 mg  10-15 mg Oral Q3H PRN Juanell Fairly, MD      . oxyCODONE (OXYCONTIN) 12 hr tablet 10 mg  10 mg Oral Q12H Juanell Fairly, MD      . pantoprazole (PROTONIX) EC tablet 40 mg  40 mg Oral Daily Juanell Fairly, MD      . potassium chloride  (K-DUR,KLOR-CON)  CR tablet 10 mEq  10 mEq Oral Daily Juanell Fairly, MD      . pravastatin (PRAVACHOL) tablet 80 mg  80 mg Oral QHS Juanell Fairly, MD      . pregabalin (LYRICA) capsule 25 mg  25 mg Oral TID Juanell Fairly, MD      . tamsulosin [facility]) capsule 0.4 mg  0.4 mg Oral Daily Juanell Fairly, MD      . traZODone (DESYREL) tablet 200 mg  200 mg Oral QHS Juanell Fairly, MD      . warfarin (COUMADIN) tablet 4 mg  4 mg Oral QHS Juanell Fairly, MD      . ziprasidone (GEODON) capsule 60 mg  60 mg Oral BID Juanell Fairly, MD         Discharge Medications: Please see discharge summary for a list of discharge medications.  Relevant Imaging Results:  Relevant Lab Results:   Additional Information  (SSN: [id number])  Haig Prophet, LCSW

## 2015-11-01 NOTE — Transfer of Care (Signed)
Immediate Anesthesia Transfer of Care Note  Patient: Brett Fernandez  Procedure(s) Performed: Procedure(s): TOTAL KNEE ARTHROPLASTY (Right)  Patient Location: PACU  Anesthesia Type:Spinal  Level of Consciousness: awake  Airway & Oxygen Therapy: Patient Spontanous Breathing and Patient connected to face mask oxygen  Post-op Assessment: Report given to RN  Post vital signs: Reviewed  Last Vitals:  Filed Vitals:   11/01/15 0626 11/01/15 1025  BP: 117/66 103/79  Pulse: 83 94  Temp: 36.2 C 36.3 C  Resp: 16 20    Complications: No apparent anesthesia complications

## 2015-11-01 NOTE — Progress Notes (Signed)
Called family for up date  Bolus given for low blood pressure

## 2015-11-01 NOTE — Progress Notes (Signed)
Pt states pain level is 9 out of 10   Pt sleeps when not stimulated

## 2015-11-01 NOTE — Anesthesia Procedure Notes (Signed)
Spinal Patient location during procedure: OR Staffing Anesthesiologist: Marline Backbone F Resident/CRNA: Rolla Plate Performed by: resident/CRNA  Preanesthetic Checklist Completed: patient identified, site marked, surgical consent, pre-op evaluation, timeout performed, IV checked, risks and benefits discussed and monitors and equipment checked Spinal Block Patient position: sitting Prep: Betadine and site prepped and draped Patient monitoring: heart rate, continuous pulse ox, blood pressure and cardiac monitor Approach: midline Location: L3-4 Injection technique: single-shot Needle Needle type: Whitacre and Introducer  Needle gauge: 25 G Needle length: 9 cm Additional Notes Negative paresthesia. Negative blood return. Positive free-flowing CSF. Expiration date of kit checked and confirmed. Patient tolerated procedure well, without complications.

## 2015-11-01 NOTE — Op Note (Signed)
DATE OF SURGERY:  11/01/2015 TIME: 10:51 AM  PATIENT NAME:  Brett Fernandez   AGE: 55 y.o.    PRE-OPERATIVE DIAGNOSIS:  UNILATERAL RIGHT KNEE PRIMARY OSTEOARTHRITIS  POST-OPERATIVE DIAGNOSIS:  Same  PROCEDURE:  Procedure(s): TOTAL KNEE ARTHROPLASTY  SURGEON:  Juanell Fairly, MD   ASSISTANT:  Myra Rude, MD Jamison Neighbor, [place] student [facility])  OPERATIVE IMPLANTS: Depuy PFC Sigma, Posterior Stabilized Femural component size 3, Tibia size rotating platform component size 4, Patella polyethylene 3-peg oval button size 38, with a 12.5 mm polyethylene insert.   PREOPERATIVE INDICATIONS:  Brett Fernandez is an 55 y.o. male who has a diagnosis of right knee osteoarthritis and elected for a right total knee arthroplasty after failing nonoperative treatment who has significant impairment of their activities of daily living.  Radiographs have demonstrated tricompartmental osteoarthritis joint space narrowing, osteophytes, subchondral sclerosis and cyst formation.  Patient has undergone successful left TKA by Dr. Myra Rude.  The risks, benefits, and alternatives were discussed at length including but not limited to the risks of infection, bleeding, nerve or blood vessel injury, knee stiffness, fracture, dislocation, loosening or failure of the hardware and the need for further surgery. Medical risks include but not limited to DVT and pulmonary embolism, myocardial infarction, stroke, pneumonia, respiratory failure and death. I discussed these risks with the patient in my office prior to the date of surgery. They understood these risks and were willing to proceed.  OPERATIVE FINDINGS AND UNIQUE ASPECTS OF THE CASE:  Right knee tricompartmental degenerative changes.  OPERATIVE DESCRIPTION:  The patient was brought to the operative room and placed in a supine position after undergoing placement of a spinal anesthetic.  A Foley catheter was placed.  IV antibiotics were given.  Patient received ancef and clindamycin for antibiotics. The lower extremity was prepped and draped in the usual sterile fashion.  A time out was performed to verify the patient's name, date of birth, medical record number, correct site of surgery and correct procedure to be performed. The timeout was also used to confirm the patient received antibiotics and that appropriate instruments, implants and radiographs studies were available in the room.  The leg was elevated and exsanguinated with an Esmarch and the tourniquet was inflated to 275 mmHg for 112 minutes..  A midline incision was made over the right knee. Full-thickness skin flaps were developed. A medial parapatellar arthrotomy was then made and the patella everted and the knee was brought into 90 of flexion. Hoffa's fat pad along with the cruciate ligaments and medial and lateral menisci were resected.   The distal femoral intramedullary canal was opened with a drill and the intramedullary distal femoral cutting jig was inserted into the femoral canal pinned into position. It was set at 5 degrees resecting 10 mm off the distal femur.  Care was taken to protect the collateral ligaments during distal femoral resection.  The distal femoral resection was performed with an oscillating saw. The femoral cutting guide was then removed.  The extramedullary tibial cutting guide was then placed using the anterior tibial crest and second ray of the foot as a references.  The tibial cutting guide was adjusted to allow for appropriate posterior slope.  The tibial cutting block was pinned into position. The slotted stylus was used to measure the proximal tibial resection of 10 mm off the high lateral side.  The tibial long rod alignment guide was then used to confirm position of the cutting block. A third cross pin through the tibial  cutting block was then drilled into position to allow for rotational stability. Care was taken during the tibial resection to protect  the medial and collateral ligaments.  The resected tibial bone was removed along with the posterior horns of the menisci.  The PCL was sacrificed.  Extension gap was measured with a spacer block and alignment and extension was confirmed using a long alignment rod.  The attention was then turned back to the femur. The posterior referencing distal femoral sizing guide was applied to the distal femur.  The femur was sized to be a 4. Rotation of the referencing guide was checked with the epicondylar axis and Whitesides line. Then the 4-in-1 cutting jig was then applied to the distal femur. A stylus was used to confirm that the anterior femur would not be notched.   Then the anterior, posterior and chamfer femoral cuts were then made with an oscillating saw.  All posterior osteophytes were removed.  The flexion gap was then measured with a flexion spacer block and long alignment rod and was found to be symmetric with the extension gap and perpendicular to mechanical axis of the tibia.  The distal femoral preparation was completed by performing the posterior stabilized box cut using the cutting block. The entry site for the intramedullary femoral guide was filled with autologous bone graft from bone previously resected earlier in the case.  The proximal tibia plateau was then sized with trial trays. The best coverage was achieved with a size 4. This tibial tray was then pinned into position. The proximal tibia was then prepared with the reamer and keel punch.  After tibial preparation was completed, all trial components were inserted with polyethylene trials.  The knee was found to have excellent balance and full motion with a size 10 mm tibial polyethylene insert..    The attention was then turned to preparation of the patella. The thickness of the patella was measured with a caliper, the diameter measured with the patella templates.  The patella resection was then made with an oscillating saw using the patella  cutting guide.  The final patellar thickness 16 mm.  3 peg holes for the patella component were then drilled. The trial patella was then placed. Knee was taken through a full range of motion and deemed to be stable with the trial components. All trial components were then removed. The knee capsule was then injected with Exparel. The joint was copiously irrigated with pulse lavage.  The final total knee arthroplasty components were then cemented into place with a 10 mm trial polyethylene insert and all excess methylmethacrylate was removed.  The joint was again copiously irrigated. After the cement had hardened the knee was again taken through a full range of motion. It was felt to be most stable with the 12.5 mm tibial polyethylene insert. The actual tibial polyethylene insert was then placed.   The knee was taken through a range of motion and the patella tracked well and the knee was again irrigated copiously.    An Autovac drain was placed. The 2 drain limbs were brought out through the superior lateral knee. The medial arthrotomy was closed with #1 Ethibond. The subcutaneous tissue closed with 0 and 2-0 vicryl, and skin approximated with staples.  A dry sterile and compressive dressing was applied.  A Polar Care was applied to the operative knee along with TENS unit pads and a knee immobilizer.  The patient was awakened and brought to the PACU in stable and satisfactory condition.  All sharp, lap and instrument counts were correct at the conclusion the case. I spoke with the patient's family in the postop consultation room to let them know the case it done without complication and the patient was stable in recovery room.   Total tourniquet time was 112 minutes.

## 2015-11-01 NOTE — Progress Notes (Signed)
ANTICOAGULATION CONSULT NOTE - Initial Consult  Pharmacy Consult for wafarin Indication: History of DVT  Allergies  Allergen Reactions  . Bee Venom Anaphylaxis  . Aspirin Other (See Comments)    [name]  . Sulfa Antibiotics Swelling    Patient Measurements:    Vital Signs: Temp: 97.5 F (36.4 C) (02/16 1449) Temp Source: Oral (02/16 1449) BP: 132/88 mmHg (02/16 1449) Pulse Rate: 73 (02/16 1449)  Labs:  Recent Labs  11/01/15 0632  LABPROT 16.0*  INR 1.27    Estimated Creatinine Clearance: 94.6 mL/min (by C-Fernandez formula based on Cr of 0.97).   Medical History: Past Medical History  Diagnosis Date  . Bipolar disorder (HCC)   . BPH (benign prostatic hypertrophy)   . Coronary artery disease   . COPD (chronic obstructive pulmonary disease) (HCC)   . DJD (degenerative joint disease)   . Deep vein thrombosis (DVT) (HCC)   . History of hiatal hernia   . Kidney stones   . Gastric ulcer   . PTSD (post-traumatic stress disorder)   . Hypothyroidism   . Anginal pain (HCC)   . CHF (congestive heart failure) (HCC)   . GERD (gastroesophageal reflux disease)     Medications:  Scheduled:  . acetaminophen  1,000 mg Oral 4 times per day  .  ceFAZolin (ANCEF) IV  2 Fernandez Intravenous Q6H  . clonazePAM  2 mg Oral QHS  . docusate sodium  100 mg Oral BID  . DULoxetine  60 mg Oral BID  . finasteride  5 mg Oral Daily  . furosemide  20 mg Oral Daily  . isosorbide mononitrate  30 mg Oral Daily  . ketorolac  15 mg Intravenous 4 times per day  . [START ON 11/02/2015] levothyroxine  50 mcg Oral QAC breakfast  . loratadine  10 mg Oral Daily  . metoprolol succinate  50 mg Oral Daily  . mirtazapine  15 mg Oral Daily  . oxyCODONE  10 mg Oral Q12H  . pantoprazole  40 mg Oral Daily  . potassium chloride  10 mEq Oral Daily  . pravastatin  80 mg Oral QHS  . pregabalin  25 mg Oral TID  . tamsulosin  0.4 mg Oral Daily  . traZODone  200 mg Oral QHS  . warfarin  4 mg Oral QHS   . ziprasidone  60 mg Oral BID    Assessment: Pharmacy consulted to dose warfarin in a 55 yo male with history of DVT.  INR today of 1.27.  Patient is post op total knee arthroplasty.   Goal of Therapy:  INR 2-3 Monitor platelets by anticoagulation protocol: Yes   Plan:  Per PTA med list, patient takes warfarin 4 mg po daily.  This is currently ordered.  Will continue and recheck INR in AM to help guide dosing adjustments.   Brett Fernandez 11/01/2015,3:01 PM

## 2015-11-01 NOTE — H&P (Signed)
The patient has been re-examined, and the chart reviewed, and there have been no interval changes to the documented history and physical.    The risks, benefits, and alternatives have been discussed at length, and the patient is willing to proceed.   

## 2015-11-01 NOTE — Anesthesia Preprocedure Evaluation (Signed)
Anesthesia Evaluation  Patient identified by MRN, date of birth, ID band Patient awake    Reviewed: Allergy & Precautions, NPO status , Patient's Chart, lab work & pertinent test results  Airway Mallampati: II       Dental  (+) Edentulous Upper, Edentulous Lower   Pulmonary neg pulmonary ROS, COPD,  COPD inhaler, Current Smoker,    + rhonchi        Cardiovascular + CAD, + Past MI and +CHF   Rhythm:Regular     Neuro/Psych Bipolar Disorder    GI/Hepatic Neg liver ROS, hiatal hernia, GERD  Medicated,  Endo/Other  Hypothyroidism   Renal/GU      Musculoskeletal   Abdominal Normal abdominal exam  (+)   Peds  Hematology   Anesthesia Other Findings   Reproductive/Obstetrics                             Anesthesia Physical Anesthesia Plan  ASA: III  Anesthesia Plan: Spinal   Post-op Pain Management:    Induction:   Airway Management Planned: Natural Airway and Nasal Cannula  Additional Equipment:   Intra-op Plan:   Post-operative Plan:   Informed Consent: I have reviewed the patients History and Physical, chart, labs and discussed the procedure including the risks, benefits and alternatives for the proposed anesthesia with the patient or authorized representative who has indicated his/her understanding and acceptance.     Plan Discussed with: CRNA  Anesthesia Plan Comments:         Anesthesia Quick Evaluation

## 2015-11-02 DIAGNOSIS — R5383 Other fatigue: Secondary | ICD-10-CM | POA: Diagnosis present

## 2015-11-02 DIAGNOSIS — E039 Hypothyroidism, unspecified: Secondary | ICD-10-CM | POA: Diagnosis present

## 2015-11-02 DIAGNOSIS — I10 Essential (primary) hypertension: Secondary | ICD-10-CM | POA: Diagnosis present

## 2015-11-02 DIAGNOSIS — J441 Chronic obstructive pulmonary disease with (acute) exacerbation: Secondary | ICD-10-CM

## 2015-11-02 DIAGNOSIS — F3170 Bipolar disorder, currently in remission, most recent episode unspecified: Secondary | ICD-10-CM | POA: Diagnosis present

## 2015-11-02 DIAGNOSIS — I959 Hypotension, unspecified: Secondary | ICD-10-CM | POA: Diagnosis present

## 2015-11-02 DIAGNOSIS — E785 Hyperlipidemia, unspecified: Secondary | ICD-10-CM | POA: Diagnosis present

## 2015-11-02 HISTORY — DX: Other fatigue: R53.83

## 2015-11-02 HISTORY — DX: Chronic obstructive pulmonary disease with (acute) exacerbation: J44.1

## 2015-11-02 LAB — MAGNESIUM: Magnesium: 1.6 mg/dL — ABNORMAL LOW (ref 1.7–2.4)

## 2015-11-02 LAB — CBC WITH DIFFERENTIAL/PLATELET
BASOS PCT: 0 %
Basophils Absolute: 0 10*3/uL (ref 0–0.1)
EOS ABS: 0.3 10*3/uL (ref 0–0.7)
Eosinophils Relative: 3 %
HCT: 30.7 % — ABNORMAL LOW (ref 40.0–52.0)
Hemoglobin: 10.1 g/dL — ABNORMAL LOW (ref 13.0–18.0)
LYMPHS ABS: 1 10*3/uL (ref 1.0–3.6)
Lymphocytes Relative: 8 %
MCH: 30.1 pg (ref 26.0–34.0)
MCHC: 32.8 g/dL (ref 32.0–36.0)
MCV: 91.5 fL (ref 80.0–100.0)
Monocytes Absolute: 1 10*3/uL (ref 0.2–1.0)
Monocytes Relative: 8 %
Neutro Abs: 9.8 10*3/uL — ABNORMAL HIGH (ref 1.4–6.5)
Neutrophils Relative %: 81 %
PLATELETS: 155 10*3/uL (ref 150–440)
RBC: 3.36 MIL/uL — AB (ref 4.40–5.90)
RDW: 14.6 % — ABNORMAL HIGH (ref 11.5–14.5)
WBC: 12.1 10*3/uL — AB (ref 3.8–10.6)

## 2015-11-02 LAB — COMPREHENSIVE METABOLIC PANEL
ALBUMIN: 2.6 g/dL — AB (ref 3.5–5.0)
ALT: 13 U/L — AB (ref 17–63)
ANION GAP: 4 — AB (ref 5–15)
AST: 13 U/L — ABNORMAL LOW (ref 15–41)
Alkaline Phosphatase: 55 U/L (ref 38–126)
BUN: 12 mg/dL (ref 6–20)
CHLORIDE: 107 mmol/L (ref 101–111)
CO2: 28 mmol/L (ref 22–32)
Calcium: 7.8 mg/dL — ABNORMAL LOW (ref 8.9–10.3)
Creatinine, Ser: 1 mg/dL (ref 0.61–1.24)
GFR calc non Af Amer: >60 mL/min (ref >60)
Glucose, Bld: 128 mg/dL — ABNORMAL HIGH (ref 65–99)
Potassium: 4 mmol/L (ref 3.5–5.1)
SODIUM: 139 mmol/L (ref 135–145)
Total Bilirubin: 0.5 mg/dL (ref 0.3–1.2)
Total Protein: 5.1 g/dL — ABNORMAL LOW (ref 6.5–8.1)

## 2015-11-02 LAB — BASIC METABOLIC PANEL
Anion gap: 7 (ref 5–15)
BUN: 10 mg/dL (ref 6–20)
CALCIUM: 8.2 mg/dL — AB (ref 8.9–10.3)
CO2: 27 mmol/L (ref 22–32)
CREATININE: 0.84 mg/dL (ref 0.61–1.24)
Chloride: 105 mmol/L (ref 101–111)
GFR calc non Af Amer: >60 mL/min (ref >60)
Glucose, Bld: 122 mg/dL — ABNORMAL HIGH (ref 65–99)
Potassium: 4 mmol/L (ref 3.5–5.1)
SODIUM: 139 mmol/L (ref 135–145)

## 2015-11-02 LAB — CBC
HEMATOCRIT: 37 % — AB (ref 40.0–52.0)
Hemoglobin: 12.3 g/dL — ABNORMAL LOW (ref 13.0–18.0)
MCH: 30.7 pg (ref 26.0–34.0)
MCHC: 33.2 g/dL (ref 32.0–36.0)
MCV: 92.4 fL (ref 80.0–100.0)
Platelets: 179 10*3/uL (ref 150–440)
RBC: 4.01 MIL/uL — ABNORMAL LOW (ref 4.40–5.90)
RDW: 14.6 % — AB (ref 11.5–14.5)
WBC: 14.3 10*3/uL — ABNORMAL HIGH (ref 3.8–10.6)

## 2015-11-02 LAB — PROTIME-INR
INR: 1.2
Prothrombin Time: 15.4 seconds — ABNORMAL HIGH (ref 11.4–15.0)

## 2015-11-02 LAB — PHOSPHORUS: PHOSPHORUS: 2.5 mg/dL (ref 2.5–4.6)

## 2015-11-02 MED ORDER — ENOXAPARIN SODIUM 40 MG/0.4ML ~~LOC~~ SOLN
40.0000 mg | SUBCUTANEOUS | Status: DC
  Administered 2015-11-02 – 2015-11-03 (×2): 40 mg via SUBCUTANEOUS
  Filled 2015-11-02 (×2): qty 0.4

## 2015-11-02 MED ORDER — MAGNESIUM OXIDE 400 (241.3 MG) MG PO TABS
400.0000 mg | ORAL_TABLET | Freq: Two times a day (BID) | ORAL | Status: DC
  Administered 2015-11-02 – 2015-11-04 (×4): 400 mg via ORAL
  Filled 2015-11-02 (×4): qty 1

## 2015-11-02 MED ORDER — KETOROLAC TROMETHAMINE 15 MG/ML IJ SOLN
15.0000 mg | Freq: Four times a day (QID) | INTRAMUSCULAR | Status: DC | PRN
  Administered 2015-11-03 (×2): 15 mg via INTRAVENOUS
  Filled 2015-11-02 (×2): qty 1

## 2015-11-02 MED ORDER — SODIUM CHLORIDE 0.9 % IV BOLUS (SEPSIS)
1000.0000 mL | Freq: Once | INTRAVENOUS | Status: AC
  Administered 2015-11-02: 1000 mL via INTRAVENOUS

## 2015-11-02 NOTE — Care Management Note (Addendum)
Case Management Note  Patient Details  Name: Brett Fernandez MRN: [id number] Date of Birth: 06-06-1961  Subjective/Objective:                  Met with patient to discuss discharge planning. He refused to go to SNF for rehab but agrees to [facility] but is undecided right now. He states he lives with his housemate [name]. He states she is able to drive him to appointments. O2 is new. His PCP is Dr. Clemmie Krill. He will need HHRN for PT/INRs for Coumadin monitoring.   Action/Plan: List of home health agencies left with patient. Patient would like to use [facility]- [facility] to [facility] with [facility]. RNCM will continue to follow.   Expected Discharge Date:                  Expected Discharge Plan:     In-House Referral:     Discharge planning Services  CM Consult  Post Acute Care Choice:  Home Health Choice offered to:  Patient  DME Arranged:    DME Agency:     HH Arranged:    [place] Agency:     Status of Service:  In process, will continue to follow  Medicare Important Message Given:    Date Medicare IM Given:    Medicare IM give by:    Date Additional Medicare IM Given:    Additional Medicare Important Message give by:     If discussed at [place] of Stay Meetings, dates discussed:    Additional Comments:  Marshell Garfinkel, RN 11/02/2015, 10:40 AM

## 2015-11-02 NOTE — Clinical Social Work Note (Signed)
Clinical Social Work Assessment  Patient Details  Name: Brett Fernandez MRN: [id number] Date of Birth: 06/18/1961  Date of referral:  11/02/15               Reason for consult:  Facility Placement, Other (Comment Required) (Patient refused SNF )                Permission sought to share information with:  Facility Contact Representative Permission granted to share information::  No  Name::        Agency::     Relationship::     Contact Information:     Housing/Transportation Living arrangements for the past 2 months:  Single Family Home Source of Information:  Patient Patient Interpreter Needed:  None Criminal Activity/Legal Involvement Pertinent to Current Situation/Hospitalization:  No - Comment as needed Significant Relationships:  Adult Children, Friend, Other Family Members Lives with:  Roommate Do you feel safe going back to the place where you live?  Yes Need for family participation in patient care:  Yes (Comment)  Care giving concerns:  Patient lives in [place] with his roommate Brett Fernandez.    Social Worker assessment / plan:  Clinical Social Worker (CSW) received SNF consult. PT is recommending SNF today however patient will likely progress to home health. CSW met with patient to discuss D/C plan. Patient was oriented X4 and was sitting in the chair. Patient appeared lethargic throughout assessment however he answered all questions appropriately. Patient reported that he lives in [place] with his roommate Brett Fernandez. Per patient he has a wheel chair, wheel chair ramp, roll aider and walker at home. Patient reported that he receives $755 in disability per month. Patient reported that his social support system includes his aunt Brett Fernandez in [place] and his 3 adult children in [place], [place]. Per patient he also has several grandchildren and great grandchildren. CSW explained that PT is recommending SNF today. CSW explained the SNF process with Medicaid only. CSW explained that  patient will have to stay at SNF for 30 days including hospital stay, SNF will likely take patient's disability check for 30 days while at SNF, and bed search will have to extend outside of  County. Patient refused SNF and reported that he is going home with home health. RN Case Manager is aware of above. CSW will continue to follow and assist as needed.   Employment status:  Disabled (Comment on whether or not currently receiving Disability) (Receives $755 in disability per month ) Insurance information:  Medicaid In State PT Recommendations:  Skilled Nursing Facility Information / Referral to community resources:  Other (Comment Required) (Patient is refusing SNF. RN Case Manager will arrange home health. )  Patient/Family's Response to care:  Patient refused SNF and is agreeable to home health.   Patient/Family's Understanding of and Emotional Response to Diagnosis, Current Treatment, and Prognosis: Patient was pleasant throughout assessment and thanked CSW for visit.   Emotional Assessment Appearance:  Appears stated age Attitude/Demeanor/Rapport:  Lethargic Affect (typically observed):  Adaptable, Pleasant Orientation:  Oriented to Self, Oriented to Place, Oriented to  Time, Oriented to Situation Alcohol / Substance use:  Not Applicable Psych involvement (Current and /or in the community):  No (Comment)  Discharge Needs  Concerns to be addressed:  Discharge Planning Concerns Readmission within the last 30 days:  No Current discharge risk:  Dependent with Mobility Barriers to Discharge:  Continued Medical Work up   Morgan,  G, LCSW 11/02/2015, 9:49 AM  

## 2015-11-02 NOTE — Progress Notes (Signed)
Spoke with Dr. Hyacinth Meeker pt lethargic and has low blood pressure 85/50. Md order to call medical consult.

## 2015-11-02 NOTE — Progress Notes (Signed)
Physical Therapy Treatment Patient Details Name: CAMILA NORVILLE MRN: [id number] DOB: 05-07-1961 Today's Date: 11/02/2015    History of Present Illness This patient is a 55 year old male who came to [facility] for a R TKR. 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 He is sleepy and difficult to keep awake.    PT Comments    Pt is making good progress towards goals, however continues to be limited by arousal level. Pt very lethargic and keeps eyes closed during most of exercises. Good endurance with ambulation as pt is most awake during this part of the PT session. All mobility performed with KI donned. Pt on room air for ambulation, however sats decrease to 89%, 1L of O2 donned with sats improving to 92%. Pt cued for correct use of rw and reciprocal gait pattern. Pt is not safe to dc home at this time secondary to impulsive nature of mobility/arousal level. Continue to recommend SNF at this time for safety concerns.  Follow Up Recommendations  SNF     Equipment Recommendations       Recommendations for Other Services       Precautions / Restrictions Precautions Precautions: Fall;Knee Precaution Booklet Issued: No Restrictions Weight Bearing Restrictions: Yes RLE Weight Bearing: Partial weight bearing    Mobility  Bed Mobility Overal bed mobility: Needs Assistance Bed Mobility: Sit to Supine     Supine to sit: Min assist Sit to supine: Min guard   General bed mobility comments: bed mobility performed with safe technique with cues for sequencing. Once supine in bed, supervision need for cues for improved positioning  Transfers Overall transfer level: Needs assistance Equipment used: Rolling walker (2 wheeled) Transfers: Sit to/from Stand Sit to Stand: Min assist         General transfer comment: transfers performed with rw. Pt impulsive and tries to stand prior to therapist instruction.  Ambulation/Gait Ambulation/Gait assistance: Min guard Ambulation Distance (Feet): 40  Feet Assistive device: Rolling walker (2 wheeled) Gait Pattern/deviations: Step-through pattern     General Gait Details: ambulated using rw and cga. + 2 required for safety as pt very lethargic and constantly needs cues to maintain alertness. Pt able to follow directions for fluid gait pattern demonstrating reciprocal gait, however stays too close to front of rw.  KI donned for all mobility.   Stairs            Wheelchair Mobility    Modified Rankin (Stroke Patients Only)       Balance Overall balance assessment: Needs assistance Sitting-balance support: Feet supported Sitting balance-Leahy Scale: Normal     Standing balance support: Bilateral upper extremity supported Standing balance-Leahy Scale: Fair Standing balance comment: posterior leaning present in standing, needs min assist for correction                    Cognition Arousal/Alertness: Lethargic;Suspect due to medications Behavior During Therapy: [facility] for tasks assessed/performed Overall Cognitive Status: Within Functional Limits for tasks assessed                      Exercises Total Joint Exercises Goniometric ROM: R Knee AAROM: 27-71 degrees Other Exercises Other Exercises: Pt performed supine ther-ex on R LE including ankle pumps, SLRs, SAQ, and hip abd/add. Pt not alert enough to continue ther-ex as he keeps falling asleep. Able to awaken with verbal cues. All ther-ex performed x 10 reps with cga.    General Comments  Pertinent Vitals/Pain Pain Assessment: 0-10 Pain Score: 9  Pain Location: R knee Pain Descriptors / Indicators: Operative site guarding Pain Intervention(s): Limited activity within patient's tolerance;Ice applied    Home Living Family/patient expects to be discharged to:: Private residence Living Arrangements: Spouse/significant other;Other (Comment) Available Help at Discharge: Family;Available 24 hours/day Type of Home: House Home Access: Ramped  entrance   Home Layout: One level Home Equipment: Walker - 2 wheels;Hospital bed      Prior Function Level of Independence: Independent          PT Goals (current goals can now be found in the care plan section) Acute Rehab PT Goals Patient Stated Goal: to go home PT Goal Formulation: With patient Time For Goal Achievement: 11/16/15 Potential to Achieve Goals: Good Additional Goals Additional Goal #1: Pt will be able to perform bed mobility/transfers with cga and rw in order to improve functional independence Progress towards PT goals: Progressing toward goals    Frequency  BID    PT Plan Current plan remains appropriate    Co-evaluation             End of Session Equipment Utilized During Treatment: Gait belt;Oxygen;Right knee immobilizer Activity Tolerance: Patient limited by pain Patient left: in bed;with bed alarm set     Time: 1322-1350 PT Time Calculation (min) (ACUTE ONLY): 28 min  Charges:  $Gait Training: 8-22 mins $Therapeutic Exercise: 8-22 mins                    G Codes:      Deztiny Sarra 11/09/15, 2:30 PM  Elizabeth Palau, PT, DPT [phone]

## 2015-11-02 NOTE — Progress Notes (Signed)
Patient working PT for am therapy.

## 2015-11-02 NOTE — Progress Notes (Signed)
Patient is on CPM machine and is tolerating it well, will continue to monitor.

## 2015-11-02 NOTE — Evaluation (Signed)
Physical Therapy Evaluation Patient Details Name: Brett Fernandez MRN: [id number] DOB: 11-18-1960 Today's Date: 11/02/2015   History of Present Illness  Pt admitted for R TKR. 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 Pt slightly confused on evaluation, not oriented to time.   Clinical Impression  Pt is a pleasant 55 year old male who was admitted for R TKR. Pt performs bed mobility, transfers, and ambulation with min assist. Pt demonstrates deficits with strength/ROM/endurance/mobility. Pt desats during mobility and demonstrates some confusion with orientation. O2 donned with increased O2 sats noted. Pt performed all mobility with KI donned. Pt very motivated and wants to return home. Would benefit from skilled PT to address above deficits and promote optimal return to PLOF.      Follow Up Recommendations SNF (pt currently refusing at this time)    Equipment Recommendations       Recommendations for Other Services       Precautions / Restrictions Precautions Precautions: Fall;Knee Precaution Booklet Issued: No Restrictions Weight Bearing Restrictions: Yes RLE Weight Bearing: Partial weight bearing      Mobility  Bed Mobility Overal bed mobility: Needs Assistance Bed Mobility: Supine to Sit     Supine to sit: Min assist     General bed mobility comments: bed mobility performed with min assist and once seated, able to maintain balance with independence  Transfers Overall transfer level: Needs assistance Equipment used: Rolling walker (2 wheeled) Transfers: Sit to/from Stand Sit to Stand: Min assist         General transfer comment: transfers performed with rw and cues for correct hand placement. Once standing, pt has some dizziness. Pt cued for pursed lip breathing.  Ambulation/Gait Ambulation/Gait assistance: Min assist Ambulation Distance (Feet): 10 Feet Assistive device: Rolling walker (2 wheeled) Gait Pattern/deviations: Step-to pattern     General Gait  Details: ambulated to recliner practicing forward/backward ambulation. Pt becomes increasinly dizzy. RN in room for assistance in bringing up chair. Once seated O2 sats decreased to 88% on room air. 1L of O2 applied with sats improved to 91%. Slow step to gait pattern performed  Stairs            Wheelchair Mobility    Modified Rankin (Stroke Patients Only)       Balance Overall balance assessment: Needs assistance Sitting-balance support: Feet supported Sitting balance-Leahy Scale: Normal     Standing balance support: Bilateral upper extremity supported Standing balance-Leahy Scale: Fair Standing balance comment: posterior leaning present in standing, needs min assist for correction                             Pertinent Vitals/Pain Pain Assessment: 0-10 Pain Score: 7  Pain Location: R knee Pain Descriptors / Indicators: Operative site guarding Pain Intervention(s): Limited activity within patient's tolerance;Patient requesting pain meds-RN notified;RN gave pain meds during session;Ice applied    Home Living Family/patient expects to be discharged to:: Private residence Living Arrangements: Spouse/significant other;Other (Comment) Available Help at Discharge: Family;Available 24 hours/day Type of Home: House Home Access: Ramped entrance     Home Layout: One level Home Equipment: Walker - 2 wheels;Hospital bed      Prior Function Level of Independence: Independent (he reports he was limping prior to admission)               Hand Dominance        Extremity/Trunk Assessment   Upper Extremity Assessment: Overall WFL for tasks assessed  Lower Extremity Assessment: Generalized weakness (R LE grossly 2+/5)         Communication   Communication: No difficulties  Cognition Arousal/Alertness: Awake/alert Behavior During Therapy: WFL for tasks assessed/performed Overall Cognitive Status: Impaired/Different from baseline                       General Comments      Exercises Total Joint Exercises Goniometric ROM: R Knee AAROM: 27-71 degrees Other Exercises Other Exercises: Pt performed supine ther-ex including R LE ankle pumps, quad sets, SLRs, and hip abd/add with min assist. Pt also performed 5 reps of knee flexion once seated in chair, needing mod assist.       Assessment/Plan    PT Assessment Patient needs continued PT services  PT Diagnosis Difficulty walking;Abnormality of gait;Generalized weakness;Acute pain   PT Problem List Decreased strength;Decreased range of motion;Decreased balance;Decreased mobility;Decreased knowledge of use of DME;Pain  PT Treatment Interventions Gait training;DME instruction;Therapeutic activities;Therapeutic exercise   PT Goals (Current goals can be found in the Care Plan section) Acute Rehab PT Goals Patient Stated Goal: to go home PT Goal Formulation: With patient Time For Goal Achievement: 11/16/15 Potential to Achieve Goals: Good    Frequency BID   Barriers to discharge        Co-evaluation               End of Session Equipment Utilized During Treatment: Gait belt;Oxygen;Right knee immobilizer Activity Tolerance: Patient limited by pain Patient left: in chair;with chair alarm set;with nursing/sitter in room;with SCD's reapplied Nurse Communication: Mobility status         Time: 2174-7159 PT Time Calculation (min) (ACUTE ONLY): 37 min   Charges:   PT Evaluation $PT Eval Moderate Complexity: 1 Procedure PT Treatments $Therapeutic Exercise: 8-22 mins   PT G Codes:        Brett Fernandez 2015/11/22, 11:16 AM  Brett Fernandez, PT, DPT [phone]

## 2015-11-02 NOTE — Evaluation (Signed)
Occupational Therapy Evaluation Patient Details Name: Brett Fernandez MRN: [id number] DOB: [birth year] Today's Date: 11/02/2015    History of Present Illness This patient is a 55 year old male who came to [facility] for a R TKR. 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 He is sleepy and difficult to keep awake.   Clinical Impression   This patient is a 55 year old male who came to [facility] for a R total knee replacement.  Patient lives with his significant other in a one story home with a ramp to enter.  He had been independent with ADL and functional mobility, but with increasing right knee pain. He now shows deficits with pain, mobility and activities of daily living.  He would benefit from Occupational Therapy for ADL/functional mobility training.      Follow Up Recommendations  SNF    Equipment Recommendations       Recommendations for Other Services       Precautions / Restrictions Precautions Precautions: Fall;Knee Precaution Booklet Issued: No Restrictions Weight Bearing Restrictions: Yes RLE Weight Bearing: Partial weight bearing      Mobility Bed Mobility   Transfers    Balance                            ADL                                         General ADL Comments: Patient had been independent with his ADL, but with increasing R knee pain.  Practiced techniques for lower body dressing using hip kit as he cannot reach his right foot. Donned and doffed socks with reacher and sock aid and practiced techniques to donn pants. Because of his fatigue, used hand over hand assist, and physical and verbal cues for technique and safety.      Vision     Perception     Praxis      Pertinent Vitals/Pain Pain Assessment: 0-10 Pain Score: 7  Pain Location: R knee Pain Descriptors / Indicators: Operative site guarding Pain Intervention(s): Ice applied (Pain meds given after PT, OT arrived soon after.)     Hand  Dominance     Extremity/Trunk Assessment Upper Extremity Assessment Upper Extremity Assessment: Overall WFL for tasks assessed   Lower Extremity Assessment Lower Extremity Assessment: Defer to PT evaluation       Communication Communication Communication: No difficulties (sleepy)   Cognition Arousal/Alertness: Lethargic;Suspect due to medications Behavior During Therapy: [facility] for tasks assessed/performed Overall Cognitive Status: Impaired/Different from baseline                     General Comments       Exercises   Shoulder Instructions      Home Living Family/patient expects to be discharged to:: Private residence Living Arrangements: Spouse/significant other;Other (Comment) Available Help at Discharge: Family;Available 24 hours/day Type of Home: House Home Access: Ramped entrance     Home Layout: One level               Home Equipment: Walker - 2 wheels;Hospital bed          Prior Functioning/Environment Level of Independence: Independent             OT Diagnosis: Acute pain   OT Problem List: Decreased range of motion;Decreased activity  tolerance;Impaired balance (sitting and/or standing);Decreased knowledge of use of DME or AE;Pain   OT Treatment/Interventions: Self-care/ADL training    OT Goals(Current goals can be found in the care plan section) Acute Rehab OT Goals Patient Stated Goal: to go home OT Goal Formulation: With patient Time For Goal Achievement: 11/16/15 Potential to Achieve Goals: Good  OT Frequency: Min 1X/week   Barriers to D/C:            Co-evaluation              End of Session Equipment Utilized During Treatment:  (hip kit)  Activity Tolerance: Patient limited by pain;Patient limited by lethargy Patient left: in chair;with call bell/phone within reach;with chair alarm set   Time: 3754-3606 OT Time Calculation (min): 19 min Charges:  OT General Charges $OT Visit: 1 Procedure OT Evaluation $OT Eval  Low Complexity: 1 Procedure G-Codes:    Myrene Galas, MS/OTR/L  11/02/2015, 11:50 AM

## 2015-11-02 NOTE — Progress Notes (Signed)
Subjective: 1 Day Post-Op Procedure(s) (LRB): TOTAL KNEE ARTHROPLASTY (Right)    Patient reports pain as mild. oob in chair.  Drain removed.  Objective:   VITALS:   Filed Vitals:   11/02/15 0349 11/02/15 0755  BP: 152/86 140/82  Pulse: 94 97  Temp: 100.5 F (38.1 C) 99.3 F (37.4 C)  Resp: 32 18    Neurovascular intact Sensation intact distally Intact pulses distally Dorsiflexion/Plantar flexion intact Incision: no drainage  LABS  Recent Labs  11/02/15 0516  HGB 12.3*  HCT 37.0*  WBC 14.3*  PLT 179     Recent Labs  11/02/15 0516  NA 139  K 4.0  BUN 10  CREATININE 0.84  GLUCOSE 122*     Recent Labs  11/01/15 0632 11/02/15 0516  INR 1.27 1.20     Assessment/Plan: 1 Day Post-Op Procedure(s) (LRB): TOTAL KNEE ARTHROPLASTY (Right)   Advance diet Up with therapy D/C IV fluids Discharge home with home health

## 2015-11-02 NOTE — Progress Notes (Signed)
Spoke with Dr. Robb Matar about sedating medications. MD to place order and not to give cymbalta or geodon tonight.

## 2015-11-02 NOTE — Consult Note (Signed)
[facility] - [place] at [facility]   PATIENT NAME: Brett Fernandez    MR#:  [id number]  DATE OF BIRTH:  [birth year]  DATE OF ADMISSION:  11/01/2015  PRIMARY CARE PHYSICIAN: Dortha Kern, MD   REQUESTING/REFERRING PHYSICIAN: Juanell Fairly, MD  CHIEF COMPLAINT:  Hypotension.   HISTORY OF PRESENT ILLNESS:  Brett Fernandez  is a 55 y.o. male with bellow past medical history who underwent a right total knee arthroplasty is getting evaluated due to hypotension and somnolence since earlier today.   The patient denies headache, blurred or double vision, numbness or weakness. He denies chest pain, dyspnea, abdominal pain, dysuria or hematuria. He is awake, alert, oriented 3. He denies any other complaints other than right knee pain. He has a past medical history of bipolar disorder, PTSD and DJD. He is on multiple psychotropic medications, opioid analgesics and muscle relaxants, per staff and these had not been given recently due to his somnolence.    PAST MEDICAL HISTORY:   Past Medical History  Diagnosis Date  . Bipolar disorder (HCC)   . BPH (benign prostatic hypertrophy)   . Coronary artery disease   . COPD (chronic obstructive pulmonary disease) (HCC)   . DJD (degenerative joint disease)   . Deep vein thrombosis (DVT) (HCC)   . History of hiatal hernia   . Kidney stones   . Gastric ulcer   . PTSD (post-traumatic stress disorder)   . Hypothyroidism   . Anginal pain (HCC)   . CHF (congestive heart failure) (HCC)   . GERD (gastroesophageal reflux disease)     PAST SURGICAL HISTORY:   Past Surgical History  Procedure Laterality Date  . Hernia repair    . Inner ear surgery    . Appendectomy    . Hemorrhoidectomy with hemorrhoid banding    . Colon polyps    . Joint replacement Left   . Total knee arthroplasty Right 11/01/2015    Procedure: TOTAL KNEE ARTHROPLASTY;  Surgeon: Juanell Fairly, MD;  Location: [facility] ORS;  Service: Orthopedics;   Laterality: Right;    SOCIAL HISTORY:   Social History  Substance Use Topics  . Smoking status: Current Every Day Smoker -- 0.50 packs/day    Types: Cigarettes  . Smokeless tobacco: Never Used  . Alcohol Use: No    FAMILY HISTORY:  History reviewed. No pertinent family history.  DRUG ALLERGIES:   Allergies  Allergen Reactions  . Bee Venom Anaphylaxis  . Aspirin Other (See Comments)    [name]  . Sulfa Antibiotics Swelling    REVIEW OF SYSTEMS:   Review of Systems  Constitutional: Positive for chills. Negative for fever.  HENT: Negative for ear discharge and sore throat.   Eyes: Negative for pain.  Respiratory: Negative for shortness of breath.   Cardiovascular: Negative for chest pain, palpitations, orthopnea and leg swelling.  Gastrointestinal: Negative for nausea and diarrhea.  Genitourinary: Negative for dysuria.  Musculoskeletal: Positive for joint pain.  Neurological: Negative for dizziness.  Psychiatric/Behavioral: Negative for hallucinations. The patient is not nervous/anxious.     MEDICATIONS AT HOME:   Prior to Admission medications   Medication Sig Start Date End Date Taking? Authorizing Provider  albuterol (PROVENTIL HFA;VENTOLIN HFA) 108 (90 Base) MCG/ACT inhaler Inhale 2 puffs into the lungs every 6 (six) hours as needed for wheezing or shortness of breath.   Yes Historical Provider, MD  clonazePAM (KLONOPIN) 2 MG tablet Take 2 mg by mouth at bedtime.   Yes Historical Provider,  MD  DULoxetine (CYMBALTA) 60 MG capsule Take 60 mg by mouth 2 (two) times daily.   Yes Historical Provider, MD  finasteride (PROSCAR) 5 MG tablet Take 5 mg by mouth daily.   Yes Historical Provider, MD  FLUTICASONE PROPIONATE, NASAL, NA Place 2 sprays into the nose 2 (two) times daily.   Yes Historical Provider, MD  furosemide (LASIX) 20 MG tablet Take 20 mg by mouth daily.   Yes Historical Provider, MD  isosorbide mononitrate (IMDUR) 30 MG 24 hr tablet Take 30  mg by mouth daily.   Yes Historical Provider, MD  levothyroxine (SYNTHROID, LEVOTHROID) 50 MCG tablet Take 50 mcg by mouth daily before breakfast.   Yes Historical Provider, MD  loratadine (CLARITIN) 10 MG tablet Take 10 mg by mouth daily.   Yes Historical Provider, MD  metoprolol succinate (TOPROL-XL) 50 MG 24 hr tablet Take 50 mg by mouth daily. Take with or immediately following a meal.   Yes Historical Provider, MD  mirtazapine (REMERON) 15 MG tablet Take 15 mg by mouth daily.   Yes Historical Provider, MD  omeprazole (PRILOSEC) 40 MG capsule Take 40 mg by mouth daily.   Yes Historical Provider, MD  oxyCODONE (OXYCONTIN) 10 mg 12 hr tablet Take 10 mg by mouth every 12 (twelve) hours.   Yes Historical Provider, MD  potassium chloride (K-DUR,KLOR-CON) 10 MEQ tablet Take 10 mEq by mouth daily.   Yes Historical Provider, MD  pravastatin (PRAVACHOL) 40 MG tablet Take 80 mg by mouth at bedtime.   Yes Historical Provider, MD  pregabalin (LYRICA) 25 MG capsule Take 25 mg by mouth 2 (two) times daily.    Yes Historical Provider, MD  tamsulosin (FLOMAX) 0.4 MG CAPS capsule Take 0.4 mg by mouth daily.   Yes Historical Provider, MD  traZODone (DESYREL) 100 MG tablet Take 200 mg by mouth at bedtime.   Yes Historical Provider, MD  ZIPRASIDONE HCL PO Take 50 mg by mouth 2 (two) times daily.   Yes Historical Provider, MD  EPINEPHrine 0.3 mg/0.3 mL IJ SOAJ injection Inject into the muscle once.    Historical Provider, MD  nitroGLYCERIN (NITROSTAT) 0.4 MG SL tablet Place 0.4 mg under the tongue every 5 (five) minutes as needed for chest pain.    Historical Provider, MD  Oxycodone HCl 10 MG TABS Take 10 mg by mouth.    Historical Provider, MD  warfarin (COUMADIN) 4 MG tablet Take 4 mg by mouth at bedtime.    Historical Provider, MD      VITAL SIGNS:  Blood pressure 85/50, pulse 81, temperature 98.7 F (37.1 C), temperature source Oral, resp. rate 18, height 5\' 8"  (1.727 m), weight 92.126 kg (203 lb 1.6 oz),  SpO2 98 %.  PHYSICAL EXAMINATION:  Physical Exam  Constitutional: He is oriented to person, place, and time and well-developed, well-nourished, and in no distress. No distress.  HENT:  Head: Normocephalic.  Eyes: Pupils are equal, round, and reactive to light.  Neck: Neck supple. No JVD present.  Cardiovascular: Normal rate and regular rhythm.   Pulmonary/Chest: Effort normal. No respiratory distress. He has no wheezes.  Abdominal: Soft. Bowel sounds are normal. He exhibits no distension. There is no tenderness. There is no rebound.  Musculoskeletal: Normal range of motion. He exhibits no edema.  Neurological: He is alert and oriented to person, place, and time. No cranial nerve deficit.  Psychiatric: He is apathetic.    LABORATORY PANEL:   CBC  Recent Labs Lab 11/02/15 2109  WBC 12.1*  HGB 10.1*  HCT 30.7*  PLT 155   ------------------------------------------------------------------------------------------------------------------  Chemistries   Recent Labs Lab 11/02/15 0516  NA 139  K 4.0  CL 105  CO2 27  GLUCOSE 122*  BUN 10  CREATININE 0.84  CALCIUM 8.2*   ------------------------------------------------------------------------------------------------------------------ ------------------------------------------------------------------------------------------------------------------  RADIOLOGY:  Dg Knee 1-2 Views Right  11/01/2015  CLINICAL DATA:  Post total right knee replacement EXAM: RIGHT KNEE - 1-2 VIEW COMPARISON:  03/21/2010 FINDINGS: Two portable views of the right knee submitted. There is right knee prosthesis with anatomic alignment. Postsurgical changes are noted with midline anterior skin staples. Postsurgical drain in place. IMPRESSION: Right knee prosthesis with anatomic alignment. Postsurgical changes are noted. Electronically Signed   By: Natasha Mead M.D.   On: 11/01/2015 11:43      IMPRESSION AND PLAN:   Patient Active Problem List    Diagnosis Date Noted  .  Hypotension Normal saline 1 L bolus and recheck blood pressure after he is given. Hold metoprolol, isosorbide and resume once the blood pressure returns to baseline.  Lethargy The patient is on multiple antihypertensives, psychotropics, analgesics and muscle relaxants. To avoid worsening of the symptoms and confusion, as to what to give or what not to give, I will discontinue the following medications: Clonazepam 2 mg by mouth at bedtime Toprol-XL 50 mg daily Mirtazapine 15 mg at bedtime OxyContin 10 mg every 12 hours Oxycodone immediate release 15 mg as needed Hydromorphone 1 mg IVP as needed. Methocarbamol 500 mg oral or intravenous as needed. Flomax 0.4 mg by mouth daily. Trazodone 200 mg by mouth at bedtime. Pregabalin 25 mg by mouth 3 times a day. Cymbalta and Geodon are still ordered, but the staff is instructed not to give them on until the patient's baseline and mental status returned to normal.  11/02/2015  . HTN (hypertension) Resume oral antihypertensive medications once the blood pressure returns to baseline. Monitor blood pressure.  11/02/2015  . Hypothyroidism Continue levothyroxine 50 g by mouth daily Check TSH level.  11/02/2015  . COPD exacerbation (HCC) Continue inhaled or nebulized bronchodilators.  11/02/2015  . Hyperlipidemia Continue pravastatin. Monitor LFTs periodically.  11/02/2015  . Bipolar disorder (HCC) Will hold sedating medications at this time. Resume once the patient's mental status returned to baseline.  11/02/2015  . History of total right knee replacement Discontinue narcotics for pain management at this time. Continue Toradol as needed for pain.  11/01/2015       All the records are reviewed and case discussed with ED provider. Management plans discussed with the patient, family and they are in agreement.  CODE STATUS: Full Code  TOTAL TIME TAKING CARE OF THIS PATIENT: 70 minutes.    Bobette Mo  M.D on 11/02/2015 at 9:31 PM  Between 7am to 6pm - Pager - [phone]  After 6pm go to [email or web address] - password [email or web address] [facility]  [place] [facility]  Office  [phone]  CC: Primary care physician; Dortha Kern, MD   Note: This dictation was prepared with Dragon dictation along with smaller phrase technology. Any transcriptional errors that result from this process are unintentional.

## 2015-11-02 NOTE — Progress Notes (Signed)
Spoke with Dr. Robb Matar about medical consult. MD will come to floor.

## 2015-11-02 NOTE — Progress Notes (Signed)
Patient's up with PT - assisted to use the urinal, unable to void. Will continue to monitor the patient urine output.

## 2015-11-03 LAB — CBC
HCT: 29 % — ABNORMAL LOW (ref 40.0–52.0)
Hemoglobin: 9.7 g/dL — ABNORMAL LOW (ref 13.0–18.0)
MCH: 30.8 pg (ref 26.0–34.0)
MCHC: 33.4 g/dL (ref 32.0–36.0)
MCV: 92.1 fL (ref 80.0–100.0)
PLATELETS: 153 10*3/uL (ref 150–440)
RBC: 3.15 MIL/uL — ABNORMAL LOW (ref 4.40–5.90)
RDW: 14.8 % — AB (ref 11.5–14.5)
WBC: 12.5 10*3/uL — ABNORMAL HIGH (ref 3.8–10.6)

## 2015-11-03 LAB — PROTIME-INR
INR: 1.51
Prothrombin Time: 18.3 seconds — ABNORMAL HIGH (ref 11.4–15.0)

## 2015-11-03 MED ORDER — BISACODYL 10 MG RE SUPP: 10.0000 mg | Freq: Once | RECTAL | Status: DC

## 2015-11-03 MED ORDER — HYDROCODONE-ACETAMINOPHEN 7.5-325 MG PO TABS
1.0000 | ORAL_TABLET | ORAL | Status: DC | PRN
  Administered 2015-11-03 – 2015-11-04 (×5): 1 via ORAL
  Filled 2015-11-03 (×5): qty 1

## 2015-11-03 NOTE — Plan of Care (Signed)
Problem: Safety: Goal: Ability to remain free from injury will improve Outcome: Progressing Bed alarm engaged. Pt encouraged to call for assistance with ambulation

## 2015-11-03 NOTE — Progress Notes (Signed)
[facility] - [place] at [facility]   PATIENT NAME: Brett Fernandez    MR#:  [id number]  DATE OF BIRTH:  1960/09/22  SUBJECTIVE:  CHIEF COMPLAINT:  No chief complaint on file.  patient is 55 year old Caucasian male with past medical history significant for history of recent RIGHT TOTAL KNEE REPLACEMENT WHO WAS noted to be hypotensive and somnolent Yesterday , 17th of February. The patient was given IV fluids and his pain medications were cut back. His blood pressure has improved. He feels good today. Admits of some pain when walking, but otherwise denies any significant discomfort including shortness of breath.   Review of Systems  Constitutional: Negative for fever, chills and weight loss.  HENT: Negative for congestion.   Eyes: Negative for blurred vision and double vision.  Respiratory: Negative for cough, sputum production, shortness of breath and wheezing.   Cardiovascular: Negative for chest pain, palpitations, orthopnea, leg swelling and PND.  Gastrointestinal: Negative for nausea, vomiting, abdominal pain, diarrhea, constipation and blood in stool.  Genitourinary: Negative for dysuria, urgency, frequency and hematuria.  Musculoskeletal: Positive for joint pain. Negative for falls.  Neurological: Negative for dizziness, tremors, focal weakness and headaches.  Endo/Heme/Allergies: Does not bruise/bleed easily.  Psychiatric/Behavioral: Negative for depression. The patient does not have insomnia.     VITAL SIGNS: Blood pressure 139/85, pulse 96, temperature 98.6 F (37 C), temperature source Oral, resp. rate 16, height 5\' 8"  (1.727 m), weight 92.126 kg (203 lb 1.6 oz), SpO2 95 %.  PHYSICAL EXAMINATION:   GENERAL:  55 y.o.-year-old patient lying in the bed with no acute distress.  EYES: Pupils equal, round, reactive to light and accommodation. No scleral icterus. Extraocular muscles intact.  HEENT: Head atraumatic, normocephalic. Oropharynx and nasopharynx  clear.  NECK:  Supple, no jugular venous distention. No thyroid enlargement, no tenderness.  LUNGS: Normal breath sounds bilaterally, no wheezing, rales,rhonchi or crepitation. No use of accessory muscles of respiration.  CARDIOVASCULAR: S1, S2 normal. No murmurs, rubs, or gallops.  ABDOMEN: Soft, nontender, nondistended. Bowel sounds present. No organomegaly or mass.  EXTREMITIES: No pedal edema, cyanosis, or clubbing. Right knee immobilizer NEUROLOGIC: Cranial nerves II through XII are intact. Muscle strength 5/5 in all extremities. Sensation intact. Gait not checked.  PSYCHIATRIC: The patient is alert and oriented x 3.  SKIN: No obvious rash, lesion, or ulcer.   ORDERS/RESULTS REVIEWED:   CBC  Recent Labs Lab 11/02/15 0516 11/02/15 2109 11/03/15 0406  WBC 14.3* 12.1* 12.5*  HGB 12.3* 10.1* 9.7*  HCT 37.0* 30.7* 29.0*  PLT 179 155 153  MCV 92.4 91.5 92.1  MCH 30.7 30.1 30.8  MCHC 33.2 32.8 33.4  RDW 14.6* 14.6* 14.8*  LYMPHSABS  --  1.0  --   MONOABS  --  1.0  --   EOSABS  --  0.3  --   BASOSABS  --  0.0  --    ------------------------------------------------------------------------------------------------------------------  Chemistries   Recent Labs Lab 11/02/15 0516 11/02/15 2109  NA 139 139  K 4.0 4.0  CL 105 107  CO2 27 28  GLUCOSE 122* 128*  BUN 10 12  CREATININE 0.84 1.00  CALCIUM 8.2* 7.8*  MG  --  1.6*  AST  --  13*  ALT  --  13*  ALKPHOS  --  55  BILITOT  --  0.5   ------------------------------------------------------------------------------------------------------------------ estimated creatinine clearance is 93 mL/min (by C-G formula based on Cr of 1). ------------------------------------------------------------------------------------------------------------------ No results for input(s): TSH, T4TOTAL, T3FREE, THYROIDAB in the  last 72 hours.  Invalid input(s): FREET3  Cardiac Enzymes No results for input(s): CKMB, TROPONINI, MYOGLOBIN in  the last 168 hours.  Invalid input(s): CK ------------------------------------------------------------------------------------------------------------------ Invalid input(s): POCBNP ---------------------------------------------------------------------------------------------------------------  RADIOLOGY: No results found.  EKG:  Orders placed or performed during the hospital encounter of 10/24/15  . EKG 12-Lead  . EKG 12-Lead    ASSESSMENT AND PLAN:  Principal Problem:   Hypotension Active Problems:   History of total right knee replacement   Lethargy   HTN (hypertension)   Hypothyroidism   COPD exacerbation (HCC)   Hyperlipidemia   Bipolar disorder (HCC)  #1. Hypotension, resolved with IV fluid administration, likely dehydration/anemia/intravascular depletion related, continue IV fluids as needed. Patient's blood pressure has improved.  #2. Altered mental status with somnolence, likely due to pain medications, resolved withholding opiates, following closely #3. Acute posthemorrhagic/postoperative anemia with hemoglobin level dropping down from 13.5 on the day of admission to 9.7 today, following with rehydration and transfusing as needed #4. Leukocytosis, improving, unlikely infection, following closely   Management plans discussed with the patient, family and they are in agreement.   DRUG ALLERGIES:  Allergies  Allergen Reactions  . Bee Venom Anaphylaxis  . Aspirin Other (See Comments)    [name]  . Sulfa Antibiotics Swelling    CODE STATUS:  Code Status History    This patient does not have a recorded code status. Please follow your organizational policy for patients in this situation.      TOTAL TIME TAKING CARE OF THIS PATIENT: 35  minutes.    Katharina Caper M.D on 11/03/2015 at 12:58 PM  Between 7am to 6pm - Pager - [phone]  After 6pm go to [email or web address] - password [email or web address] [facility]  [place] [facility]  Office   [phone]  CC: Primary care physician; Dortha Kern, MD

## 2015-11-03 NOTE — Patient Instructions (Signed)
Ed on LB dressing and safety Where to get AE  Verbalize understanding

## 2015-11-03 NOTE — Progress Notes (Signed)
Occupational Therapy Treatment Patient Details Name: Brett Fernandez MRN: [id number] DOB: [birth year] Today's Date: 11/03/2015    History of present illness This patient is a 55 year old male who came to [facility] for a R TKR. 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   OT comments  Pt much more awake this date- able to get on EOB and participate in LB Dressing and standing for clothing management - still needed Knee immobilizer but no LOB - needed m/v for hand placements - not pulling up on walker - cont with plan to increase Ind in ADL's and safety - pt told where to get AE   Follow Up Recommendations  Supervision/Assistance - 24 hour    Equipment Recommendations  3 in 1 bedside comode    Recommendations for Other Services      Precautions / Restrictions Precautions Precautions: Fall;Knee Precaution Booklet Issued: No Required Braces or Orthoses: Knee Immobilizer - Right Restrictions Weight Bearing Restrictions: Yes RLE Weight Bearing: Partial weight bearing       Mobility Bed Mobility Overal bed mobility: Modified Independent Bed Mobility: Supine to Sit     Supine to sit: Modified independent (Device/Increase time) (rails)        Transfers Overall transfer level: Needs assistance Equipment used: Rolling walker (2 wheeled) Transfers: Sit to/from Stand Sit to Stand: Min guard         General transfer comment: Verbal cues for hand placement and safety    Balance                                   ADL Overall ADL's : Needs assistance/impaired                 Upper Body Dressing : Independent;Set up     Lower Body Dressing Details (indicate cue type and reason): Sitting EOB doffing socks Independent w/o AE - donning pants without AE - and doffing - with donning of socks needed Min A with AE to donn R sock, Ind with setup for sock L- because of pt history for COPD and heart issues per report he gets tired and SOC - for LB dressing  -would recommned  AE - pt cannot rememvber if he took home AE after L  TKR - was in rehab for month  Toilet Transfer: Clinical biochemist Details (indicate cue type and reason): CG pt pushing up from walker - impulsive - to slow down and push up from commode or bed - pulling up pants without LOB and CG                   Vision                     Perception     Praxis      Cognition   Behavior During Therapy: WFL for tasks assessed/performed Overall Cognitive Status: Within Functional Limits for tasks assessed                       Extremity/Trunk Assessment               Exercises   Shoulder Instructions       General Comments      Pertinent Vitals/ Pain       Pain Assessment: 0-10 Pain Score: 4 Pain Location: R knee Pain Descriptors / Indicators: Operative site guarding Pain Intervention(s):  Limited activity within patient's tolerance;Ice applied  Home Living                                          Prior Functioning/Environment              Frequency Min 1X/week     Progress Toward Goals  OT Goals(current goals can now be found in the care plan section)  Progress towards OT goals: Progressing toward goals  Acute Rehab OT Goals Patient Stated Goal: Going home tomorrow per pt  OT Goal Formulation: With patient Time For Goal Achievement: 11/16/15 Potential to Achieve Goals: Good  Plan      Co-evaluation                 End of Session     Activity Tolerance     Patient Left with call bell/phone within reach;in bed;with bed alarm set   Nurse Communication          Time: 6503-5465 OT Time Calculation (min): 32 min  Charges: OT General Charges $OT Visit: 1 Procedure OT Treatments $Self Care/Home Management : 23-37 mins  Demira Gwynne OTR/L,CLT 11/03/2015, 12:00 PM

## 2015-11-03 NOTE — Progress Notes (Signed)
Subjective: 2 Days Post-Op Procedure(s) (LRB): TOTAL KNEE ARTHROPLASTY (Right)    Patient reports pain as moderate. Had hypotension last night probably from overmedication. Better today off meds.  Will order some pain meds.  On coumadin.  INR 1.51.  On Lovenox.    Objective:   VITALS:   Filed Vitals:   11/03/15 0341 11/03/15 0813  BP: 132/86 139/85  Pulse: 107 96  Temp: 99.5 F (37.5 C) 98.6 F (37 C)  Resp:  16    Neurologically intact Neurovascular intact Sensation intact distally Intact pulses distally Dorsiflexion/Plantar flexion intact Incision: no drainage  LABS  Recent Labs  11/02/15 0516 11/02/15 2109 11/03/15 0406  HGB 12.3* 10.1* 9.7*  HCT 37.0* 30.7* 29.0*  WBC 14.3* 12.1* 12.5*  PLT 179 155 153     Recent Labs  11/02/15 0516 11/02/15 2109  NA 139 139  K 4.0 4.0  BUN 10 12  CREATININE 0.84 1.00  GLUCOSE 122* 128*     Recent Labs  11/02/15 0516 11/03/15 0406  INR 1.20 1.51     Assessment/Plan: 2 Days Post-Op Procedure(s) (LRB): TOTAL KNEE ARTHROPLASTY (Right)   Advance diet Up with therapy D/C IV fluids Plan for discharge tomorrow

## 2015-11-03 NOTE — Progress Notes (Signed)
Physical Therapy Treatment Patient Details Name: Brett Fernandez MRN: [id number] DOB: 1961/02/14 Today's Date: 11/03/2015    History of Present Illness This patient is a 55 year old male who came to [facility] for a R TKR. 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 He is sleepy and difficult to keep awake.    PT Comments    Pt in bed upon arrival ready to participate.  Pt with overall improved mobility.  No complaints of dizziness noted.  Pt was able to recall exercise program stating he had been doing exercises in bed this morning.  Pt was able to ambulate around nursing unit with one seated rest break with KI on R LE.  88 degrees knee flexion R LE.  Tentative D/C home in am per MD.  Pt stated he feels comfortable with this plan.  Has assist at home.    Follow Up Recommendations        Equipment Recommendations       Recommendations for Other Services       Precautions / Restrictions Precautions Precautions: Fall;Knee Precaution Booklet Issued: No Required Braces or Orthoses: Knee Immobilizer - Right Restrictions Weight Bearing Restrictions: Yes RLE Weight Bearing: Partial weight bearing    Mobility  Bed Mobility Overal bed mobility: Modified Independent Bed Mobility: Supine to Sit     Supine to sit: Modified independent (Device/Increase time) (rails)        Transfers Overall transfer level: Needs assistance Equipment used: Rolling walker (2 wheeled) Transfers: Sit to/from Stand Sit to Stand: Min guard         General transfer comment: Verbal cues for hand placement and safety  Ambulation/Gait Ambulation/Gait assistance: Min guard Ambulation Distance (Feet): 80 Feet (x 2  requeired seated rest) Assistive device: Rolling walker (2 wheeled) Gait Pattern/deviations: Step-through pattern;Decreased stance time - right     General Gait Details: Improved mobility, no complaints of dizziness.   Stairs            Wheelchair Mobility    Modified Rankin (Stroke  Patients Only)       Balance                                    Cognition Arousal/Alertness: Awake/alert Behavior During Therapy: WFL for tasks assessed/performed Overall Cognitive Status: Within Functional Limits for tasks assessed                      Exercises Total Joint Exercises Goniometric ROM: 88 flexion right knee Other Exercises Other Exercises: Supine Quad sets, seated arom and stretching R knee    General Comments        Pertinent Vitals/Pain Pain Assessment: 0-10 Pain Score: 8  Pain Location: R knee Pain Descriptors / Indicators: Operative site guarding Pain Intervention(s): Limited activity within patient's tolerance;Ice applied    Home Living                      Prior Function            PT Goals (current goals can now be found in the care plan section) Progress towards PT goals: Progressing toward goals    Frequency       PT Plan      Co-evaluation             End of Session Equipment Utilized During Treatment: Gait belt Activity Tolerance: Patient tolerated  treatment well;Patient limited by pain Patient left: in chair;with call bell/phone within reach;with chair alarm set     Time: [phone] PT Time Calculation (min) (ACUTE ONLY): 23 min  Charges:  $Gait Training: 8-22 mins $Therapeutic Exercise: 8-22 mins                    G Codes:     Danielle Dess, PTA November 27, 2015, 10:10 AM

## 2015-11-03 NOTE — Progress Notes (Signed)
Physical Therapy Treatment Patient Details Name: Brett Fernandez MRN: [id number] DOB: 1961/01/29 Today's Date: 11/03/2015    History of Present Illness This patient is a 55 year old male who came to [facility] for a R TKR. 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     PT Comments    Pt tolerating treatment session well, motivated and able to complete entire PT sesssion as planned. Pt continues to make progress toward goals as evidenced by improved gait tolerance and indep in HEP/self care. Pt's greatest limitation continues to be pain in R knee, wrists, and hands, which continues to limit ability to perform all functional mobility at baseline function. Patient presenting with impairment of strength, pain, range of motion, balance, and activity tolerance, limiting ability to perform ADL and mobility tasks at  baseline level of function. Patient will benefit from skilled intervention to address the above impairments and limitations, in order to restore to prior level of function, improve patient safety upon discharge, and to decrease caregiver burden. In light of recent improvements in ambulation distance and quality, PT updating DC recommendations to home with HHPT.     Follow Up Recommendations  Home health PT     Equipment Recommendations  Other (comment)    Recommendations for Other Services       Precautions / Restrictions Precautions Precautions: Knee Required Braces or Orthoses: Knee Immobilizer - Right Restrictions Weight Bearing Restrictions: Yes RLE Weight Bearing: Partial weight bearing    Mobility  Bed Mobility Overal bed mobility: Modified Independent Bed Mobility: Supine to Sit;Sit to Supine     Supine to sit: Modified independent (Device/Increase time) Sit to supine: Modified independent (Device/Increase time)      Transfers Overall transfer level: Needs assistance Equipment used: None Transfers: Sit to/from Stand Sit to Stand: Supervision         General  transfer comment: maintains PWB in stance; assists with donning KI  Ambulation/Gait Ambulation/Gait assistance: Supervision Ambulation Distance (Feet): 160 Feet Assistive device: Rolling walker (2 wheeled)   Gait velocity: 0.15 Gait velocity interpretation: <1.8 ft/sec, indicative of risk for recurrent falls General Gait Details: VC for equal step length; uses excessive trunk flexion for swing phase on R to favor psoas over rectus femoris. Pauses halfway to assess O2 and give a 15 second rest to bilar wrists and triceps.    Stairs            Wheelchair Mobility    Modified Rankin (Stroke Patients Only)       Balance Overall balance assessment: No apparent balance deficits (not formally assessed)                                  Cognition Arousal/Alertness: Awake/alert Behavior During Therapy: WFL for tasks assessed/performed Overall Cognitive Status: Within Functional Limits for tasks assessed                      Exercises Other Exercises Other Exercises: Report she has been working on SLR all afternoon, sets of 30 and has really over-done it. We reviewed HEP and use of towel and/or gait belt to self assist for mobility.     General Comments        Pertinent Vitals/Pain Pain Assessment: 0-10 Pain Score: 8  Pain Location: R knee Pain Intervention(s): Limited activity within patient's tolerance;Monitored during session;Premedicated before session;Ice applied    Home Living  Prior Function            PT Goals (current goals can now be found in the care plan section) Acute Rehab PT Goals Patient Stated Goal: Going home tomorrow per pt  PT Goal Formulation: With patient Time For Goal Achievement: 11/16/15 Potential to Achieve Goals: Good Additional Goals Additional Goal #1: Pt will be able to perform bed mobility/transfers with cga and rw in order to improve functional independence Progress towards PT  goals: Progressing toward goals    Frequency  BID    PT Plan Discharge plan needs to be updated    Co-evaluation             End of Session Equipment Utilized During Treatment: Gait belt Activity Tolerance: Patient tolerated treatment well;Patient limited by pain;Patient limited by fatigue Patient left: in bed;with call bell/phone within reach;with bed alarm set;with SCD's reapplied     Time: [id number] PT Time Calculation (min) (ACUTE ONLY): 23 min  Charges:  $Gait Training: 8-22 mins $Therapeutic Exercise: 23-37 mins                    G Codes:      4:51 PM, December 01, 2015 Rosamaria Lints, PT, DPT PRN Physical Therapist at [facility] License # [id number] [phone] (mobile)

## 2015-11-04 ENCOUNTER — Encounter: Payer: Self-pay | Admitting: Radiology

## 2015-11-04 ENCOUNTER — Inpatient Hospital Stay: Payer: Medicaid Other

## 2015-11-04 LAB — CBC
HEMATOCRIT: 29.2 % — AB (ref 40.0–52.0)
HEMOGLOBIN: 9.7 g/dL — AB (ref 13.0–18.0)
MCH: 30.8 pg (ref 26.0–34.0)
MCHC: 33.4 g/dL (ref 32.0–36.0)
MCV: 92.2 fL (ref 80.0–100.0)
Platelets: 176 10*3/uL (ref 150–440)
RBC: 3.16 MIL/uL — ABNORMAL LOW (ref 4.40–5.90)
RDW: 14.5 % (ref 11.5–14.5)
WBC: 11.9 10*3/uL — AB (ref 3.8–10.6)

## 2015-11-04 LAB — MAGNESIUM: Magnesium: 1.6 mg/dL — ABNORMAL LOW (ref 1.7–2.4)

## 2015-11-04 LAB — PROTIME-INR
INR: 1.6
PROTHROMBIN TIME: 19.1 s — AB (ref 11.4–15.0)

## 2015-11-04 MED ORDER — OXYCODONE HCL 5 MG PO TABS
5.0000 mg | ORAL_TABLET | ORAL | Status: DC | PRN
  Administered 2015-11-04: 5 mg via ORAL
  Filled 2015-11-04: qty 1

## 2015-11-04 MED ORDER — IOHEXOL 350 MG/ML SOLN
75.0000 mL | Freq: Once | INTRAVENOUS | Status: AC | PRN
  Administered 2015-11-04: 75 mL via INTRAVENOUS

## 2015-11-04 MED ORDER — HYDROCODONE-ACETAMINOPHEN 7.5-325 MG PO TABS: 1.0000 | ORAL_TABLET | Freq: Four times a day (QID) | ORAL | Status: DC | PRN

## 2015-11-04 MED ORDER — MAGNESIUM SULFATE 4 GM/100ML IV SOLN
4.0000 g | Freq: Once | INTRAVENOUS | Status: AC
  Administered 2015-11-04: 4 g via INTRAVENOUS
  Filled 2015-11-04: qty 100

## 2015-11-04 NOTE — Progress Notes (Signed)
Subjective: 3 Days Post-Op Procedure(s) (LRB): TOTAL KNEE ARTHROPLASTY (Right)    Patient reports pain as mild. Ready to go home. INR 1.6  Will continue coumadin  Objective:   VITALS:   Filed Vitals:   11/04/15 0336 11/04/15 0729  BP: 139/76 127/87  Pulse: 113 113  Temp: 98.9 F (37.2 C) 99.1 F (37.3 C)  Resp: 18 16    Neurologically intact Neurovascular intact Sensation intact distally Intact pulses distally Dorsiflexion/Plantar flexion intact Incision: no drainage  LABS  Recent Labs  11/02/15 2109 11/03/15 0406 11/04/15 0447  HGB 10.1* 9.7* 9.7*  HCT 30.7* 29.0* 29.2*  WBC 12.1* 12.5* 11.9*  PLT 155 153 176     Recent Labs  11/02/15 0516 11/02/15 2109  NA 139 139  K 4.0 4.0  BUN 10 12  CREATININE 0.84 1.00  GLUCOSE 122* 128*     Recent Labs  11/03/15 0406 11/04/15 0447  INR 1.51 1.60     Assessment/Plan: 3 Days Post-Op Procedure(s) (LRB): TOTAL KNEE ARTHROPLASTY (Right)   Discharge home with home health  Home health nurse for protimes

## 2015-11-04 NOTE — Progress Notes (Signed)
[facility] [place] at [facility]   PATIENT NAME: Brett Fernandez    MR#:  [id number]  DATE OF BIRTH:  01-28-1961  SUBJECTIVE:  CHIEF COMPLAINT:  No chief complaint on file.  patient is 55 year old Caucasian male with past medical history significant for history of recent RIGHT TOTAL KNEE REPLACEMENT WHO WAS noted to be hypotensive and somnolent 17th of February. The patient was given IV fluids and his pain medications were cut back. His blood pressure has improved. He feels good today. Admits of some pain when walking, but otherwise denies any significant discomfort including shortness of breath.  The patient continues to be tachycardic, he underwent CTA of chest revealing mild patchy density in the dependent portion of left upper lobe concerning for infection or inflammation, no PE. Patient denies any cough or sputum production, shortness of breath  Review of Systems  Constitutional: Negative for fever, chills and weight loss.  HENT: Negative for congestion.   Eyes: Negative for blurred vision and double vision.  Respiratory: Negative for cough, sputum production, shortness of breath and wheezing.   Cardiovascular: Negative for chest pain, palpitations, orthopnea, leg swelling and PND.  Gastrointestinal: Negative for nausea, vomiting, abdominal pain, diarrhea, constipation and blood in stool.  Genitourinary: Negative for dysuria, urgency, frequency and hematuria.  Musculoskeletal: Positive for joint pain. Negative for falls.  Neurological: Negative for dizziness, tremors, focal weakness and headaches.  Endo/Heme/Allergies: Does not bruise/bleed easily.  Psychiatric/Behavioral: Negative for depression. The patient does not have insomnia.     VITAL SIGNS: Blood pressure 127/87, pulse 113, temperature 99.1 F (37.3 C), temperature source Oral, resp. rate 16, height 5\' 8"  (1.727 m), weight 92.126 kg (203 lb 1.6 oz), SpO2 95 %.  PHYSICAL EXAMINATION:   GENERAL:   55 y.o.-year-old patient lying in the bed with no acute distress.  EYES: Pupils equal, round, reactive to light and accommodation. No scleral icterus. Extraocular muscles intact.  HEENT: Head atraumatic, normocephalic. Oropharynx and nasopharynx clear.  NECK:  Supple, no jugular venous distention. No thyroid enlargement, no tenderness.  LUNGS: Normal breath sounds bilaterally, no wheezing, rales,rhonchi or crepitation. No use of accessory muscles of respiration.  CARDIOVASCULAR: S1, S2 normal. No murmurs, rubs, or gallops.  ABDOMEN: Soft, nontender, nondistended. Bowel sounds present. No organomegaly or mass.  EXTREMITIES: No pedal edema, cyanosis, or clubbing. Right knee immobilizer NEUROLOGIC: Cranial nerves II through XII are intact. Muscle strength 5/5 in all extremities. Sensation intact. Gait not checked.  PSYCHIATRIC: The patient is alert and oriented x 3.  SKIN: No obvious rash, lesion, or ulcer.   ORDERS/RESULTS REVIEWED:   CBC  Recent Labs Lab 11/02/15 0516 11/02/15 2109 11/03/15 0406 11/04/15 0447  WBC 14.3* 12.1* 12.5* 11.9*  HGB 12.3* 10.1* 9.7* 9.7*  HCT 37.0* 30.7* 29.0* 29.2*  PLT 179 155 153 176  MCV 92.4 91.5 92.1 92.2  MCH 30.7 30.1 30.8 30.8  MCHC 33.2 32.8 33.4 33.4  RDW 14.6* 14.6* 14.8* 14.5  LYMPHSABS  --  1.0  --   --   MONOABS  --  1.0  --   --   EOSABS  --  0.3  --   --   BASOSABS  --  0.0  --   --    ------------------------------------------------------------------------------------------------------------------  Chemistries   Recent Labs Lab 11/02/15 0516 11/02/15 2109 11/04/15 0447  NA 139 139  --   K 4.0 4.0  --   CL 105 107  --   CO2 27 28  --  GLUCOSE 122* 128*  --   BUN 10 12  --   CREATININE 0.84 1.00  --   CALCIUM 8.2* 7.8*  --   MG  --  1.6* 1.6*  AST  --  13*  --   ALT  --  13*  --   ALKPHOS  --  55  --   BILITOT  --  0.5  --     ------------------------------------------------------------------------------------------------------------------ estimated creatinine clearance is 93 mL/min (by C-G formula based on Cr of 1). ------------------------------------------------------------------------------------------------------------------ No results for input(s): TSH, T4TOTAL, T3FREE, THYROIDAB in the last 72 hours.  Invalid input(s): FREET3  Cardiac Enzymes No results for input(s): CKMB, TROPONINI, MYOGLOBIN in the last 168 hours.  Invalid input(s): CK ------------------------------------------------------------------------------------------------------------------ Invalid input(s): POCBNP ---------------------------------------------------------------------------------------------------------------  RADIOLOGY: Ct Angio Chest Pe W/cm &/or Wo Cm  11/04/2015  CLINICAL DATA:  Increased shortness of breath and tachycardia since yesterday. Status post knee surgery 3 days ago. EXAM: CT ANGIOGRAPHY CHEST WITH CONTRAST TECHNIQUE: Multidetector CT imaging of the chest was performed using the standard protocol during bolus administration of intravenous contrast. Multiplanar CT image reconstructions and MIPs were obtained to evaluate the vascular anatomy. CONTRAST:  62mL OMNIPAQUE IOHEXOL 350 MG/ML SOLN COMPARISON:  Chest radiographs dated 10/24/2015 and chest CTA dated 10/12/2014. FINDINGS: Mediastinum/Lymph Nodes: Normally opacified pulmonary arteries with no pulmonary arterial filling defects seen. The previously demonstrated 10 mm right hilar lymph node currently has a maximum corresponding diameter of 16 mm. No other enlarged lymph nodes are seen. Lungs/Pleura: Bilateral bullous changes are again demonstrated. These remain number most pronounced in the upper lobes. There is also linear scarring at both lung bases. There is interval mild patchy density in the dependent portion of the left upper lobe along the major fissure, in an  area of bullous changes. No lung nodules or pleural fluid. Two small lipomas are noted along the superior aspect of the right lobe of the liver. Upper abdomen: Multiple small upper pole left renal calculi. The largest measures 6 mm in maximum diameter. Musculoskeletal: Mild thoracic spine degenerative changes. Review of the MIP images confirms the above findings. IMPRESSION: 1. Interval mild patchy density in the dependent portion of the left upper lobe, along the major fissure. This could represent infection or inflammation. A follow-up chest CT with contrast recommended in 6-12 months to assess for resolution. 2. Stable changes of COPD with paraseptal emphysema. 3. Mild progressive enlargement of a right hilar lymph node. In the absence of other adenopathy, this may be reactive. 4. Multiple small, nonobstructing upper pole left renal calculi. Electronically Signed   By: Beckie Salts M.D.   On: 11/04/2015 09:54    EKG:  Orders placed or performed during the hospital encounter of 10/24/15  . EKG 12-Lead  . EKG 12-Lead    ASSESSMENT AND PLAN:  Principal Problem:   Hypotension Active Problems:   History of total right knee replacement   Lethargy   HTN (hypertension)   Hypothyroidism   COPD exacerbation (HCC)   Hyperlipidemia   Bipolar disorder (HCC)  #1. Hypotension, resolved with IV fluid administration, likely dehydration/anemia/intravascular depletion/pain medications related. Patient's blood pressure has improved. Patient is being discharged to home with home health today by surgeons  #2. Altered mental status with somnolence, likely due to pain medications, resolved  by holding opiates #3. Acute posthemorrhagic/postoperative anemia with hemoglobin level dropping down from 13.5 on the day of admission to 9.7 today, stable with rehydration , no need for transfusion #4. Leukocytosis, improving, unlikely infection, following  closely #5. Tachycardia, no PE, likely related to medications  including combination of Cymbalta and trazodone, continue metoprolol at home doses   Management plans discussed with the patient, family and they are in agreement.   DRUG ALLERGIES:  Allergies  Allergen Reactions  . Bee Venom Anaphylaxis  . Aspirin Other (See Comments)    [name]  . Sulfa Antibiotics Swelling    CODE STATUS:  Code Status History    This patient does not have a recorded code status. Please follow your organizational policy for patients in this situation.      TOTAL TIME TAKING CARE OF THIS PATIENT: 30  minutes.    Katharina Caper M.D on 11/04/2015 at 2:27 PM  Between 7am to 6pm - Pager - [phone]  After 6pm go to [email or web address] - password [email or web address] [facility]  [place] [facility]  Office  [phone]  CC: Primary care physician; Dortha Kern, MD

## 2015-11-04 NOTE — Care Management Note (Signed)
Case Management Note  Patient Details  Name: Brett Fernandez MRN: [id number] Date of Birth: 28-Sep-1960  Subjective/Objective:        Referral faxed to [facility] requesting PT and RN. Note to [facility] that RN is to monitor INR.            Action/Plan:   Expected Discharge Date:                  Expected Discharge Plan:     In-House Referral:     Discharge planning Services  CM Consult  Post Acute Care Choice:  Home Health Choice offered to:  Patient  DME Arranged:    DME Agency:     HH Arranged:    HH Agency:     Status of Service:  In process, will continue to follow  Medicare Important Message Given:    Date Medicare IM Given:    Medicare IM give by:    Date Additional Medicare IM Given:    Additional Medicare Important Message give by:     If discussed at Long Length of Stay Meetings, dates discussed:    Additional Comments:  Quinnlyn Hearns A, RN 11/04/2015, 12:55 PM

## 2015-11-04 NOTE — Progress Notes (Signed)
PT Cancellation Note  Patient Details Name: Brett Fernandez MRN: [id number] DOB: [birth year]   Cancelled Treatment:    Reason Eval/Treat Not Completed: Patient at procedure or test/unavailable. Chart reviewed, RN/MD consulted. Holding treatment at this time as pt has gone down for chest angio due to sustained tachycardia. Will  await results prior to working with patient.   12:45 PM, 11/04/2015 Rosamaria Lints, PT, DPT PRN Physical Therapist - [facility] License # [id number] [phone] (mobile)

## 2015-11-04 NOTE — Discharge Summary (Signed)
Physician Discharge Summary  Patient ID: Brett Fernandez MRN: [id number] DOB/AGE: 17-May-1961 55 y.o.  Admit date: 11/01/2015 Discharge date: 11/04/2015  Admission Diagnoses: Arthritis right knee   Discharge Diagnoses: same Principal Problem:   Hypotension Active Problems:   History of total right knee replacement   Lethargy   HTN (hypertension)   Hypothyroidism   COPD exacerbation (HCC)   Hyperlipidemia   Bipolar disorder [facility])   Discharged Condition: good  Hospital Course: Total knee surgery 11/01/15.  Did well post op in general and progressed with PT.  Anxious to go home.  Seen by medical service for hypotension which responded to fluid and decrease in narcotics.  Wound benign.  Coumadin restarted  Consults: hospitalist  Significant Diagnostic Studies: radiology: CT scan: chest.  No acute changes  Treatments: therapies: PT  Discharge Exam: Blood pressure 127/87, pulse 113, temperature 99.1 F (37.3 C), temperature source Oral, resp. rate 16, height 5\' 8"  (1.727 m), weight 92.126 kg (203 lb 1.6 oz), SpO2 95 %. Extremities: Homans sign is negative, no sign of DVT Minimal swelling.  csm good.  Dressing dry  Disposition:   Discharge Instructions    Ambulatory referral to Home Health    Complete by:  As directed   Please evaluate Brett Fernandez for admission to [facility].  Disciplines requested: Nursing and Physical Therapy  Services to provide: Strengthening Exercises and Other: protime Tuesday and Friday  Physician to follow patient's care (the person listed here will be responsible for signing ongoing orders): PCP  Requested Start of Care Date: Within 2-3 days  I certify that this patient is under my care and that I, or a Nurse Practitioner or Physician's Assistant working with me, had a face-to-face encounter that meets the physician face-to-face requirements with patient on 11/04/15. The encounter with the patient was in whole, or in part for the following  medical condition(s) which is the primary reason for home health care (List medical condition). Right total knee and chronic coumadin therapy  Special Instructions:  none  Does the patient have Medicare or Medicaid?:  Yes  The encounter with the patient was in whole, or in part, for the following medical condition, which is the primary reason for home health care:  Right total knee, chronic coumadin therapy  Reason for Medically Necessary Home Health Services:  Therapy- Investment banker, operational, Patent examiner  My clinical findings support the need for the above services:  Unable to leave home safely without assistance and/or assistive device  I certify that, based on my findings, the following services are medically necessary home health services:   Nursing Physical therapy    Further, I certify that my clinical findings support that this patient is homebound due to:  Ambulates short distances less than 300 feet     Call MD for:  difficulty breathing, headache or visual disturbances    Complete by:  As directed      Call MD for:  persistant nausea and vomiting    Complete by:  As directed      Call MD for:  redness, tenderness, or signs of infection (pain, swelling, redness, odor or green/yellow discharge around incision site)    Complete by:  As directed      Call MD for:  severe uncontrolled pain    Complete by:  As directed      Call MD for:  temperature >100.4    Complete by:  As directed      Diet -  low sodium heart healthy    Complete by:  As directed      Discharge instructions    Complete by:  As directed   Partial weight right leg Do range of motion exercise every 2-3 hours Call office for appointment with Dr Martha Clan in 10-12 days Home health PT and nursing to see pt.     Increase activity slowly    Complete by:  As directed      Leave dressing on - Keep it clean, dry, and intact until clinic visit    Complete by:  As directed             Medication List     STOP taking these medications        EPINEPHrine 0.3 mg/0.3 mL Soaj injection  Commonly known as:  EPI-PEN      TAKE these medications        albuterol 108 (90 Base) MCG/ACT inhaler  Commonly known as:  PROVENTIL HFA;VENTOLIN HFA  Inhale 2 puffs into the lungs every 6 (six) hours as needed for wheezing or shortness of breath.     clonazePAM 2 MG tablet  Commonly known as:  KLONOPIN  Take 2 mg by mouth at bedtime.     DULoxetine 60 MG capsule  Commonly known as:  CYMBALTA  Take 60 mg by mouth 2 (two) times daily.     finasteride 5 MG tablet  Commonly known as:  PROSCAR  Take 5 mg by mouth daily.     FLUTICASONE PROPIONATE (NASAL) NA  Place 2 sprays into the nose 2 (two) times daily.     furosemide 20 MG tablet  Commonly known as:  LASIX  Take 20 mg by mouth daily.     HYDROcodone-acetaminophen 7.5-325 MG tablet  Commonly known as:  NORCO  Take 1 tablet by mouth every 6 (six) hours as needed for moderate pain.     isosorbide mononitrate 30 MG 24 hr tablet  Commonly known as:  IMDUR  Take 30 mg by mouth daily.     levothyroxine 50 MCG tablet  Commonly known as:  SYNTHROID, LEVOTHROID  Take 50 mcg by mouth daily before breakfast.     loratadine 10 MG tablet  Commonly known as:  CLARITIN  Take 10 mg by mouth daily.     metoprolol succinate 50 MG 24 hr tablet  Commonly known as:  TOPROL-XL  Take 50 mg by mouth daily. Take with or immediately following a meal.     mirtazapine 15 MG tablet  Commonly known as:  REMERON  Take 15 mg by mouth daily.     nitroGLYCERIN 0.4 MG SL tablet  Commonly known as:  NITROSTAT  Place 0.4 mg under the tongue every 5 (five) minutes as needed for chest pain.     omeprazole 40 MG capsule  Commonly known as:  PRILOSEC  Take 40 mg by mouth daily.     Oxycodone HCl 10 MG Tabs  Take 10 mg by mouth.     OXYCONTIN 10 mg 12 hr tablet  Generic drug:  oxyCODONE  Take 10 mg by mouth every 12 (twelve) hours.     potassium chloride  10 MEQ tablet  Commonly known as:  K-DUR,KLOR-CON  Take 10 mEq by mouth daily.     pravastatin 40 MG tablet  Commonly known as:  PRAVACHOL  Take 80 mg by mouth at bedtime.     pregabalin 25 MG capsule  Commonly known as:  LYRICA  Take 25  mg by mouth 2 (two) times daily.     tamsulosin 0.4 MG Caps capsule  Commonly known as:  FLOMAX  Take 0.4 mg by mouth daily.     traZODone 100 MG tablet  Commonly known as:  DESYREL  Take 200 mg by mouth at bedtime.     warfarin 4 MG tablet  Commonly known as:  COUMADIN  Take 4 mg by mouth at bedtime.     ZIPRASIDONE HCL PO  Take 50 mg by mouth 2 (two) times daily.           Follow-up Information    Follow up with Juanell Fairly, MD. Schedule an appointment as soon as possible for a visit in 10 days.   Specialty:  Orthopedic Surgery   Why:  For wound re-check   Contact information:   [address] [place] [address] [phone]       Signed: Valinda Hoar 11/04/2015, 12:29 PM

## 2015-11-05 NOTE — Care Management Note (Signed)
Case Management Note  Patient Details  Name: CHRISTOPH COPELAN MRN: [id number] Date of Birth: [birth year]  Subjective/Objective:     Entry on 11/05/15: Received a call from Rosario Jacks today stating "My room mate is involved in some legal issues with [facility] so I want to use another company for home health. I don't care who else comes except I don't want [facility]." This writer called [facility], [facility], [facility], none of which would accept straight Medicaid. Frances Furbish is unsure but requested a fax referral. Anibal Henderson at [facility] agreed to accept Mr Rotert for home health PT and RN services and will contact Mr Laur today by phone and in person tomorrow.                Action/Plan:   Expected Discharge Date:                  Expected Discharge Plan:     In-House Referral:     Discharge planning Services  CM Consult  Post Acute Care Choice:  Home Health Choice offered to:  Patient  DME Arranged:    DME Agency:     HH Arranged:    HH Agency:     Status of Service:  In process, will continue to follow  Medicare Important Message Given:    Date Medicare IM Given:    Medicare IM give by:    Date Additional Medicare IM Given:    Additional Medicare Important Message give by:     If discussed at Long Length of Stay Meetings, dates discussed:    Additional Comments:  Tanveer Brammer A, RN 11/05/2015, 3:45 PM

## 2015-11-06 LAB — TYPE AND SCREEN
ABO/RH(D): O POS
ANTIBODY SCREEN: NEGATIVE
UNIT DIVISION: 0
Unit division: 0

## 2015-11-06 LAB — PREPARE RBC (CROSSMATCH)

## 2015-12-05 ENCOUNTER — Ambulatory Visit: Payer: Medicaid Other | Admitting: Anesthesiology

## 2016-01-03 ENCOUNTER — Ambulatory Visit
Admission: RE | Admit: 2016-01-03 | Discharge: 2016-01-03 | Disposition: A | Discharge status: Home / Self Care | Payer: Medicaid Other | Source: Ambulatory Visit | Attending: Family Medicine | Admitting: Family Medicine

## 2016-01-03 ENCOUNTER — Other Ambulatory Visit: Payer: Self-pay | Admitting: Family Medicine

## 2016-01-03 DIAGNOSIS — J90 Pleural effusion, not elsewhere classified: Secondary | ICD-10-CM | POA: Diagnosis not present

## 2016-01-03 DIAGNOSIS — R05 Cough: Secondary | ICD-10-CM | POA: Insufficient documentation

## 2016-01-03 DIAGNOSIS — R059 Cough, unspecified: Secondary | ICD-10-CM

## 2016-01-04 ENCOUNTER — Other Ambulatory Visit: Payer: Self-pay | Admitting: Family Medicine

## 2016-01-04 DIAGNOSIS — R05 Cough: Secondary | ICD-10-CM

## 2016-01-04 DIAGNOSIS — R059 Cough, unspecified: Secondary | ICD-10-CM

## 2016-07-17 ENCOUNTER — Other Ambulatory Visit
Admission: RE | Admit: 2016-07-17 | Discharge: 2016-07-17 | Disposition: A | Discharge status: Home / Self Care | Payer: Medicaid Other | Source: Ambulatory Visit | Attending: Pain Medicine | Admitting: Pain Medicine

## 2016-07-17 ENCOUNTER — Ambulatory Visit: Payer: Medicaid Other | Attending: Pain Medicine | Admitting: Pain Medicine

## 2016-07-17 ENCOUNTER — Ambulatory Visit
Admission: RE | Admit: 2016-07-17 | Discharge: 2016-07-17 | Disposition: A | Discharge status: Home / Self Care | Payer: Medicaid Other | Source: Ambulatory Visit | Attending: Pain Medicine | Admitting: Pain Medicine

## 2016-07-17 ENCOUNTER — Encounter: Payer: Self-pay | Admitting: Pain Medicine

## 2016-07-17 VITALS — BP 118/72 | HR 65 | Temp 98.1°F | Resp 16 | Ht 68.0 in | Wt 160.0 lb

## 2016-07-17 DIAGNOSIS — K589 Irritable bowel syndrome without diarrhea: Secondary | ICD-10-CM

## 2016-07-17 DIAGNOSIS — M25511 Pain in right shoulder: Secondary | ICD-10-CM

## 2016-07-17 DIAGNOSIS — I2510 Atherosclerotic heart disease of native coronary artery without angina pectoris: Secondary | ICD-10-CM | POA: Insufficient documentation

## 2016-07-17 DIAGNOSIS — M79604 Pain in right leg: Secondary | ICD-10-CM | POA: Diagnosis present

## 2016-07-17 DIAGNOSIS — Z8673 Personal history of transient ischemic attack (TIA), and cerebral infarction without residual deficits: Secondary | ICD-10-CM

## 2016-07-17 DIAGNOSIS — M25561 Pain in right knee: Secondary | ICD-10-CM

## 2016-07-17 DIAGNOSIS — Z7901 Long term (current) use of anticoagulants: Secondary | ICD-10-CM | POA: Diagnosis not present

## 2016-07-17 DIAGNOSIS — M545 Low back pain, unspecified: Secondary | ICD-10-CM | POA: Insufficient documentation

## 2016-07-17 DIAGNOSIS — Z0189 Encounter for other specified special examinations: Secondary | ICD-10-CM

## 2016-07-17 DIAGNOSIS — G894 Chronic pain syndrome: Secondary | ICD-10-CM

## 2016-07-17 DIAGNOSIS — K219 Gastro-esophageal reflux disease without esophagitis: Secondary | ICD-10-CM

## 2016-07-17 DIAGNOSIS — J441 Chronic obstructive pulmonary disease with (acute) exacerbation: Secondary | ICD-10-CM | POA: Insufficient documentation

## 2016-07-17 DIAGNOSIS — M8580 Other specified disorders of bone density and structure, unspecified site: Secondary | ICD-10-CM | POA: Diagnosis not present

## 2016-07-17 DIAGNOSIS — M25562 Pain in left knee: Secondary | ICD-10-CM | POA: Insufficient documentation

## 2016-07-17 DIAGNOSIS — M0579 Rheumatoid arthritis with rheumatoid factor of multiple sites without organ or systems involvement: Secondary | ICD-10-CM | POA: Diagnosis not present

## 2016-07-17 DIAGNOSIS — D869 Sarcoidosis, unspecified: Secondary | ICD-10-CM | POA: Diagnosis not present

## 2016-07-17 DIAGNOSIS — M79605 Pain in left leg: Secondary | ICD-10-CM | POA: Insufficient documentation

## 2016-07-17 DIAGNOSIS — M79676 Pain in unspecified toe(s): Secondary | ICD-10-CM | POA: Diagnosis not present

## 2016-07-17 DIAGNOSIS — I509 Heart failure, unspecified: Secondary | ICD-10-CM | POA: Diagnosis not present

## 2016-07-17 DIAGNOSIS — M797 Fibromyalgia: Secondary | ICD-10-CM | POA: Diagnosis not present

## 2016-07-17 DIAGNOSIS — F411 Generalized anxiety disorder: Secondary | ICD-10-CM | POA: Diagnosis not present

## 2016-07-17 DIAGNOSIS — M25512 Pain in left shoulder: Secondary | ICD-10-CM

## 2016-07-17 DIAGNOSIS — M79601 Pain in right arm: Secondary | ICD-10-CM | POA: Insufficient documentation

## 2016-07-17 DIAGNOSIS — M79602 Pain in left arm: Secondary | ICD-10-CM | POA: Insufficient documentation

## 2016-07-17 DIAGNOSIS — M79674 Pain in right toe(s): Secondary | ICD-10-CM

## 2016-07-17 DIAGNOSIS — N400 Enlarged prostate without lower urinary tract symptoms: Secondary | ICD-10-CM | POA: Insufficient documentation

## 2016-07-17 DIAGNOSIS — Z96652 Presence of left artificial knee joint: Secondary | ICD-10-CM | POA: Diagnosis not present

## 2016-07-17 DIAGNOSIS — G35 Multiple sclerosis: Secondary | ICD-10-CM | POA: Diagnosis not present

## 2016-07-17 DIAGNOSIS — M170 Bilateral primary osteoarthritis of knee: Secondary | ICD-10-CM | POA: Insufficient documentation

## 2016-07-17 DIAGNOSIS — I959 Hypotension, unspecified: Secondary | ICD-10-CM | POA: Insufficient documentation

## 2016-07-17 DIAGNOSIS — Z8659 Personal history of other mental and behavioral disorders: Secondary | ICD-10-CM

## 2016-07-17 DIAGNOSIS — M419 Scoliosis, unspecified: Secondary | ICD-10-CM | POA: Diagnosis not present

## 2016-07-17 DIAGNOSIS — Z5181 Encounter for therapeutic drug level monitoring: Secondary | ICD-10-CM

## 2016-07-17 DIAGNOSIS — M79643 Pain in unspecified hand: Secondary | ICD-10-CM

## 2016-07-17 DIAGNOSIS — Z62819 Personal history of unspecified abuse in childhood: Secondary | ICD-10-CM

## 2016-07-17 DIAGNOSIS — Z86718 Personal history of other venous thrombosis and embolism: Secondary | ICD-10-CM | POA: Insufficient documentation

## 2016-07-17 DIAGNOSIS — G8929 Other chronic pain: Secondary | ICD-10-CM | POA: Insufficient documentation

## 2016-07-17 DIAGNOSIS — Z96651 Presence of right artificial knee joint: Secondary | ICD-10-CM

## 2016-07-17 DIAGNOSIS — M4186 Other forms of scoliosis, lumbar region: Secondary | ICD-10-CM | POA: Diagnosis not present

## 2016-07-17 DIAGNOSIS — F17200 Nicotine dependence, unspecified, uncomplicated: Secondary | ICD-10-CM

## 2016-07-17 DIAGNOSIS — K449 Diaphragmatic hernia without obstruction or gangrene: Secondary | ICD-10-CM | POA: Diagnosis not present

## 2016-07-17 DIAGNOSIS — I252 Old myocardial infarction: Secondary | ICD-10-CM

## 2016-07-17 DIAGNOSIS — Z8679 Personal history of other diseases of the circulatory system: Secondary | ICD-10-CM

## 2016-07-17 DIAGNOSIS — Z9151 Personal history of suicidal behavior: Secondary | ICD-10-CM

## 2016-07-17 DIAGNOSIS — G35D Multiple sclerosis, unspecified: Secondary | ICD-10-CM

## 2016-07-17 DIAGNOSIS — Z96653 Presence of artificial knee joint, bilateral: Secondary | ICD-10-CM | POA: Diagnosis not present

## 2016-07-17 DIAGNOSIS — F17210 Nicotine dependence, cigarettes, uncomplicated: Secondary | ICD-10-CM | POA: Insufficient documentation

## 2016-07-17 DIAGNOSIS — Z915 Personal history of self-harm: Secondary | ICD-10-CM | POA: Insufficient documentation

## 2016-07-17 DIAGNOSIS — J449 Chronic obstructive pulmonary disease, unspecified: Secondary | ICD-10-CM | POA: Insufficient documentation

## 2016-07-17 DIAGNOSIS — F1190 Opioid use, unspecified, uncomplicated: Secondary | ICD-10-CM

## 2016-07-17 DIAGNOSIS — I110 Hypertensive heart disease with heart failure: Secondary | ICD-10-CM | POA: Insufficient documentation

## 2016-07-17 DIAGNOSIS — M150 Primary generalized (osteo)arthritis: Secondary | ICD-10-CM

## 2016-07-17 DIAGNOSIS — F4310 Post-traumatic stress disorder, unspecified: Secondary | ICD-10-CM | POA: Insufficient documentation

## 2016-07-17 DIAGNOSIS — M542 Cervicalgia: Secondary | ICD-10-CM | POA: Insufficient documentation

## 2016-07-17 DIAGNOSIS — F410 Panic disorder [episodic paroxysmal anxiety] without agoraphobia: Secondary | ICD-10-CM | POA: Insufficient documentation

## 2016-07-17 DIAGNOSIS — E785 Hyperlipidemia, unspecified: Secondary | ICD-10-CM | POA: Insufficient documentation

## 2016-07-17 DIAGNOSIS — Z79891 Long term (current) use of opiate analgesic: Secondary | ICD-10-CM | POA: Diagnosis not present

## 2016-07-17 DIAGNOSIS — F32A Depression, unspecified: Secondary | ICD-10-CM

## 2016-07-17 DIAGNOSIS — M79675 Pain in left toe(s): Secondary | ICD-10-CM

## 2016-07-17 DIAGNOSIS — F319 Bipolar disorder, unspecified: Secondary | ICD-10-CM | POA: Insufficient documentation

## 2016-07-17 DIAGNOSIS — F329 Major depressive disorder, single episode, unspecified: Secondary | ICD-10-CM

## 2016-07-17 DIAGNOSIS — Z87442 Personal history of urinary calculi: Secondary | ICD-10-CM

## 2016-07-17 DIAGNOSIS — J42 Unspecified chronic bronchitis: Secondary | ICD-10-CM

## 2016-07-17 DIAGNOSIS — E039 Hypothyroidism, unspecified: Secondary | ICD-10-CM | POA: Insufficient documentation

## 2016-07-17 DIAGNOSIS — M159 Polyosteoarthritis, unspecified: Secondary | ICD-10-CM | POA: Insufficient documentation

## 2016-07-17 LAB — CBC
HCT: 40.8 % (ref 40.0–52.0)
Hemoglobin: 13.1 g/dL (ref 13.0–18.0)
MCH: 28.3 pg (ref 26.0–34.0)
MCHC: 32.2 g/dL (ref 32.0–36.0)
MCV: 88.1 fL (ref 80.0–100.0)
PLATELETS: 221 10*3/uL (ref 150–440)
RBC: 4.63 MIL/uL (ref 4.40–5.90)
RDW: 17.4 % — AB (ref 11.5–14.5)
WBC: 11.8 10*3/uL — AB (ref 3.8–10.6)

## 2016-07-17 LAB — PROTIME-INR
INR: 1.74
PROTHROMBIN TIME: 20.6 s — AB (ref 11.4–15.2)

## 2016-07-17 LAB — COMPREHENSIVE METABOLIC PANEL
ALBUMIN: 4 g/dL (ref 3.5–5.0)
ALT: 14 U/L — AB (ref 17–63)
ANION GAP: 6 (ref 5–15)
AST: 17 U/L (ref 15–41)
Alkaline Phosphatase: 66 U/L (ref 38–126)
BUN: 12 mg/dL (ref 6–20)
CHLORIDE: 103 mmol/L (ref 101–111)
CO2: 32 mmol/L (ref 22–32)
Calcium: 9.5 mg/dL (ref 8.9–10.3)
Creatinine, Ser: 1.02 mg/dL (ref 0.61–1.24)
GFR calc non Af Amer: >60 mL/min (ref >60)
GLUCOSE: 82 mg/dL (ref 65–99)
Potassium: 3.7 mmol/L (ref 3.5–5.1)
SODIUM: 141 mmol/L (ref 135–145)
Total Bilirubin: 0.3 mg/dL (ref 0.3–1.2)
Total Protein: 7.2 g/dL (ref 6.5–8.1)

## 2016-07-17 LAB — VITAMIN B12: Vitamin B-12: 474 pg/mL (ref 180–914)

## 2016-07-17 LAB — BRAIN NATRIURETIC PEPTIDE: B NATRIURETIC PEPTIDE 5: 17 pg/mL (ref 0.0–100.0)

## 2016-07-17 LAB — MAGNESIUM: MAGNESIUM: 1.8 mg/dL (ref 1.7–2.4)

## 2016-07-17 LAB — C-REACTIVE PROTEIN: CRP: 0.8 mg/dL (ref <1.0)

## 2016-07-17 LAB — APTT: APTT: 41 s — AB (ref 24–36)

## 2016-07-17 LAB — SEDIMENTATION RATE: SED RATE: 21 mm/h — AB (ref 0–20)

## 2016-07-17 NOTE — Progress Notes (Signed)
Safety precautions to be maintained throughout the outpatient stay will include: orient to surroundings, keep bed in low position, maintain call bell within reach at all times, provide assistance with transfer out of bed and ambulation.  
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
## 2016-07-18 LAB — RHEUMATOID FACTOR

## 2016-07-18 LAB — ANA W/REFLEX IF POSITIVE: Anti Nuclear Antibody(ANA): NEGATIVE

## 2016-07-20 LAB — 25-HYDROXY VITAMIN D LCMS D2+D3
25-Hydroxy, Vitamin D-2: <1 ng/mL
25-Hydroxy, Vitamin D-3: 46 ng/mL

## 2016-07-20 LAB — 25-HYDROXYVITAMIN D LCMS D2+D3: 25-HYDROXY, VITAMIN D: 46 ng/mL

## 2016-07-25 LAB — COMPLIANCE DRUG ANALYSIS, UR

## 2016-07-30 DIAGNOSIS — M79604 Pain in right leg: Secondary | ICD-10-CM

## 2016-07-30 DIAGNOSIS — G8929 Other chronic pain: Secondary | ICD-10-CM | POA: Insufficient documentation

## 2016-07-30 DIAGNOSIS — M79605 Pain in left leg: Secondary | ICD-10-CM

## 2016-08-10 NOTE — Progress Notes (Signed)
While most low ALT level results indicate a normal healthy liver, that may not always be the case. A low-functioning or non-functioning liver, lacking normal levels of ALT activity to begin with, would not release a lot of ALT into the blood when damaged. People infected with the hepatitis C virus initially show high ALT levels in their blood, but these levels fall over time. Because the ALT test measures ALT levels at only one point in time, people with chronic hepatitis C infection may already have experienced the ALT peak well before blood was drawn for the ALT test. Urinary tract infections or malnutrition may also cause low blood ALT levels.

## 2016-08-10 NOTE — Progress Notes (Signed)
-  A normal sedimentation rate should be below 30 mm/hr. The sed rate is an acute phase reactant that indirectly measures the degree of inflammation present in the body. It can be acute, developing rapidly after trauma, injury or infection, for example, or can occur over an extended time (chronic) with conditions such as autoimmune diseases or cancer. The ESR is not diagnostic; it is a non-specific, screening test that may be elevated in a number of these different conditions. It provides general information about the presence or absence of an inflammatory condition. 

## 2016-10-30 ENCOUNTER — Encounter: Payer: Self-pay | Admitting: Pain Medicine

## 2016-10-30 ENCOUNTER — Ambulatory Visit: Payer: Medicaid Other | Attending: Pain Medicine | Admitting: Pain Medicine

## 2016-10-30 VITALS — BP 115/78 | HR 81 | Temp 98.5°F | Resp 18 | Ht 68.0 in | Wt 166.0 lb

## 2016-10-30 DIAGNOSIS — Z79891 Long term (current) use of opiate analgesic: Secondary | ICD-10-CM

## 2016-10-30 DIAGNOSIS — J449 Chronic obstructive pulmonary disease, unspecified: Secondary | ICD-10-CM | POA: Insufficient documentation

## 2016-10-30 DIAGNOSIS — M47818 Spondylosis without myelopathy or radiculopathy, sacral and sacrococcygeal region: Secondary | ICD-10-CM

## 2016-10-30 DIAGNOSIS — I2510 Atherosclerotic heart disease of native coronary artery without angina pectoris: Secondary | ICD-10-CM | POA: Diagnosis not present

## 2016-10-30 DIAGNOSIS — M79641 Pain in right hand: Secondary | ICD-10-CM | POA: Insufficient documentation

## 2016-10-30 DIAGNOSIS — M791 Myalgia: Secondary | ICD-10-CM

## 2016-10-30 DIAGNOSIS — M79643 Pain in unspecified hand: Secondary | ICD-10-CM

## 2016-10-30 DIAGNOSIS — M797 Fibromyalgia: Secondary | ICD-10-CM | POA: Diagnosis not present

## 2016-10-30 DIAGNOSIS — M461 Sacroiliitis, not elsewhere classified: Secondary | ICD-10-CM

## 2016-10-30 DIAGNOSIS — M79672 Pain in left foot: Secondary | ICD-10-CM | POA: Diagnosis present

## 2016-10-30 DIAGNOSIS — Z96653 Presence of artificial knee joint, bilateral: Secondary | ICD-10-CM | POA: Diagnosis not present

## 2016-10-30 DIAGNOSIS — I10 Essential (primary) hypertension: Secondary | ICD-10-CM | POA: Insufficient documentation

## 2016-10-30 DIAGNOSIS — G8929 Other chronic pain: Secondary | ICD-10-CM

## 2016-10-30 DIAGNOSIS — M79605 Pain in left leg: Secondary | ICD-10-CM | POA: Diagnosis present

## 2016-10-30 DIAGNOSIS — G35 Multiple sclerosis: Secondary | ICD-10-CM | POA: Diagnosis not present

## 2016-10-30 DIAGNOSIS — G894 Chronic pain syndrome: Secondary | ICD-10-CM

## 2016-10-30 DIAGNOSIS — M542 Cervicalgia: Secondary | ICD-10-CM | POA: Insufficient documentation

## 2016-10-30 DIAGNOSIS — M545 Low back pain: Secondary | ICD-10-CM | POA: Diagnosis not present

## 2016-10-30 DIAGNOSIS — M546 Pain in thoracic spine: Secondary | ICD-10-CM | POA: Diagnosis not present

## 2016-10-30 DIAGNOSIS — M47898 Other spondylosis, sacral and sacrococcygeal region: Secondary | ICD-10-CM | POA: Diagnosis not present

## 2016-10-30 DIAGNOSIS — F3170 Bipolar disorder, currently in remission, most recent episode unspecified: Secondary | ICD-10-CM

## 2016-10-30 DIAGNOSIS — Z86718 Personal history of other venous thrombosis and embolism: Secondary | ICD-10-CM | POA: Insufficient documentation

## 2016-10-30 DIAGNOSIS — F319 Bipolar disorder, unspecified: Secondary | ICD-10-CM | POA: Diagnosis not present

## 2016-10-30 DIAGNOSIS — M79642 Pain in left hand: Secondary | ICD-10-CM | POA: Insufficient documentation

## 2016-10-30 DIAGNOSIS — F4310 Post-traumatic stress disorder, unspecified: Secondary | ICD-10-CM | POA: Insufficient documentation

## 2016-10-30 DIAGNOSIS — M25512 Pain in left shoulder: Secondary | ICD-10-CM | POA: Diagnosis not present

## 2016-10-30 DIAGNOSIS — M7918 Myalgia, other site: Secondary | ICD-10-CM | POA: Insufficient documentation

## 2016-10-30 DIAGNOSIS — M79604 Pain in right leg: Secondary | ICD-10-CM | POA: Diagnosis not present

## 2016-10-30 DIAGNOSIS — M79671 Pain in right foot: Secondary | ICD-10-CM | POA: Diagnosis not present

## 2016-10-30 DIAGNOSIS — K219 Gastro-esophageal reflux disease without esophagitis: Secondary | ICD-10-CM | POA: Diagnosis not present

## 2016-10-30 DIAGNOSIS — M533 Sacrococcygeal disorders, not elsewhere classified: Secondary | ICD-10-CM

## 2016-10-30 DIAGNOSIS — M25561 Pain in right knee: Secondary | ICD-10-CM

## 2016-10-30 DIAGNOSIS — F17210 Nicotine dependence, cigarettes, uncomplicated: Secondary | ICD-10-CM | POA: Insufficient documentation

## 2016-10-30 DIAGNOSIS — M792 Neuralgia and neuritis, unspecified: Secondary | ICD-10-CM

## 2016-10-30 DIAGNOSIS — M25562 Pain in left knee: Secondary | ICD-10-CM

## 2016-10-30 DIAGNOSIS — F1190 Opioid use, unspecified, uncomplicated: Secondary | ICD-10-CM

## 2016-10-30 MED ORDER — PREGABALIN 25 MG PO CAPS: 25.0000 mg | ORAL_CAPSULE | Freq: Two times a day (BID) | ORAL | 0 refills | Status: DC

## 2016-10-30 MED ORDER — OXYCODONE HCL 5 MG PO TABS: 5.0000 mg | ORAL_TABLET | Freq: Four times a day (QID) | ORAL | 0 refills | Status: DC | PRN

## 2016-10-30 NOTE — Progress Notes (Signed)
Safety precautions to be maintained throughout the outpatient stay will include: orient to surroundings, keep bed in low position, maintain call bell within reach at all times, provide assistance with transfer out of bed and ambulation.  

## 2016-10-30 NOTE — Progress Notes (Signed)
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 C)  SpO2: 97%  Weight: 166 lb (75.3 kg)  Height: _0  (1.727 m)   BMI Assessment: Estimated body mass index is 25.24 kg/m as calculated from the following:   Height as of this encounter: _1  (1.727 m).   Weight as of this encounter: 166 lb (75.3 kg).  BMI interpretation table: BMI level Category Range association with higher incidence of chronic pain  <18 kg/m2 Underweight   18.5-24.9 kg/m2 Ideal body weight   25-29.9 kg/m2 Overweight Increased incidence by 20%  30-34.9 kg/m2 Obese (Class I) Increased incidence by 68%  35-39.9 kg/m2 Severe obesity (Class II) Increased incidence by 136%  >40 kg/m2 Extreme obesity (Class III) Increased incidence by 254%   BMI Readings from Last 4 Encounters:  10/30/16 25.24 kg/m  07/17/16 24.33 kg/m  11/01/15 30.88 kg/m  10/24/15 29.95 kg/m   Wt Readings from Last 4 Encounters:  10/30/16 166 lb (75.3 kg)  07/17/16 160 lb (72.6 kg)  11/01/15 203 lb 1.6 oz (92.1 kg)  10/24/15 197 lb (89.4 kg)  Psych/Mental status: Alert,  oriented x 3 (person, place, & time)       Eyes: PERLA Respiratory: No evidence of acute respiratory distress  Cervical Spine Exam  Inspection: No masses, redness, or swelling Alignment: Symmetrical Functional ROM: Unrestricted ROM Stability: No instability detected Muscle strength & Tone: Functionally intact Sensory: Unimpaired Palpation: Non-contributory  Upper Extremity (UE) Exam    Side: Right upper extremity  Side: Left upper extremity  Inspection: No masses, redness, swelling, or asymmetry. No contractures  Inspection: No masses, redness, swelling, or asymmetry. No contractures  Functional ROM: Unrestricted ROM          Functional ROM: Unrestricted ROM          Muscle strength & Tone: Functionally intact  Muscle strength & Tone: Functionally intact  Sensory: Unimpaired  Sensory: Unimpaired  Palpation: Euthermic  Palpation: Euthermic  Specialized Test(s): Deferred         Specialized Test(s): Deferred          Thoracic Spine Exam  Inspection: No masses, redness, or swelling Alignment: Symmetrical Functional ROM: Unrestricted ROM Stability: No instability detected Sensory: Unimpaired Muscle strength & Tone: Functionally intact Palpation: Non-contributory  Lumbar Spine Exam  Inspection: No masses, redness, or swelling Alignment: Symmetrical Functional ROM: Unrestricted ROM Stability: No instability detected Muscle strength & Tone: Functionally intact Sensory: Unimpaired Palpation: Non-contributory Provocative Tests: Lumbar Hyperextension and rotation test: evaluation deferred today       Patrick's Maneuver: evaluation deferred today              Gait & Posture Assessment  Ambulation: Unassisted Gait: Relatively normal for age and body habitus Posture: WNL   Lower Extremity Exam    Side: Right lower extremity  Side: Left lower extremity  Inspection: No masses, redness, swelling, or asymmetry. No contractures  Inspection: No masses, redness, swelling, or asymmetry.  No contractures  Functional ROM: Unrestricted ROM          Functional ROM: Unrestricted ROM          Muscle strength & Tone: Functionally intact  Muscle strength & Tone: Functionally intact  Sensory: Unimpaired  Sensory: Unimpaired  Palpation: No palpable anomalies  Palpation: No palpable anomalies   Assessment & Plan  Primary Diagnosis & Pertinent Problem List: The primary encounter diagnosis was Chronic lower extremity pain (Location of Primary Source of Pain) (Bilateral) (R>L). 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  Visit Diagnosis: 1. Chronic lower extremity pain (Location of Primary Source of Pain) (Bilateral) (R>L)   2. Chronic low back pain (Location of Secondary source of pain) (Bilateral) (R>L)   3. Chronic hand pain, unspecified laterality   4. Chronic pain syndrome   5. Fibromyalgia   6. Long term current use of opiate analgesic   7. Opiate use   8. Multiple sclerosis ([place])   9. Musculoskeletal pain   10. Neurogenic pain   11. Bipolar disorder   12. Chronic sacroiliac joint pain   13. Chronic sacroiliac DJD   14. 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 Problem-specific Plan(s): No problem-specific Assessment & Plan notes found for this encounter.  Assessment & plan notes cannot be loaded without a specified hospital service.  Plan of Care  Pharmacotherapy (Medications Ordered): Meds ordered this encounter  Medications  . pregabalin (LYRICA) 25 MG capsule     Sig: Take 1 capsule (25 mg total) by mouth 2 (two) times daily.    Dispense:  60 capsule    Refill:  0    Do not place medication on "Automatic Refill". Fill one day early if pharmacy is closed on scheduled refill date.  Marland Kitchen oxyCODONE (OXY IR/ROXICODONE) 5 MG immediate release tablet    Sig: Take 1 tablet (5 mg total) by mouth every 6 (six) hours as needed for severe pain.    Dispense:  120 tablet    Refill:  0    Do not place this medication, or any other prescription from our practice, on "Automatic Refill". Patient may have prescription filled one day early if pharmacy is closed on scheduled refill date. Do not fill until: 11/06/16 To last until: 12/06/16   Lab-work, procedure(s), and/or referral(s): Orders Placed This Encounter  Procedures  . GENICULAR NERVE BLOCK    Pharmacotherapy: Opioid Analgesics: We'll take over management today. See above orders Membrane stabilizer: We have discussed the possibility of optimizing this mode of therapy, if tolerated Muscle relaxant: We have discussed the possibility of a trial NSAID: We have discussed the possibility of a trial Other analgesic(s): To be determined at a later time   Interventional therapies: Planned, scheduled, and/or pending:    Stop Coumadin 5 days prior to procedures Diagnostic bilateral genicular nerve blocks    Considering:   Stop Coumadin 5 days prior to procedures Diagnostic bilateral genicular nerve blocks  Possible bilateral genicular nerve radiofrequency ablation  Diagnostic bilateral lumbar facet block  Possible bilateral lumbar facet radiofrequency ablation  Diagnostic bilateral sacroiliac joint block  Possible bilateral sacroiliac joint radiofrequency ablation  Diagnostic cervical epidural steroid injection    PRN Procedures:   To be determined at a later time   Provider-requested follow-up: Return in about 1 month (around 11/27/2016) for (MD) Med-Mgmt, in addition, (stop blood thinner before  procedure).  Future Appointments Date Time Provider [place]  11/27/2016 11:30 AM Milinda Pointer, MD [facility] None    Primary Care Physician: Brett Jude, MD Location: [facility] Note by: Brett Fernandez. Brett Fernandez, M.D, DABA, DABAPM, DABPM, DABIPP, FIPP Date: 10/30/2016; Time: 9:03 AM  Pain Score Disclaimer: We use the NRS-11 scale.  This is a self-reported, subjective measurement of pain severity with only modest accuracy. It is used primarily to identify changes within a particular patient. It must be understood that outpatient pain scales are significantly less accurate that those used for research, where they can be applied under ideal controlled circumstances with minimal exposure to variables. In reality, the score is likely to be a combination of pain intensity and pain affect, where pain affect describes the degree of emotional arousal or changes in action readiness caused by the sensory experience of pain. Factors such as social and work situation, setting, emotional state, anxiety levels, expectation, and prior pain experience may influence pain perception and show large inter-individual differences that may also be affected by time variables.  Patient instructions provided during this appointment: Patient Instructions   Steps to Quit Smoking Smoking tobacco can be bad for your health. It can also affect almost every organ in your body. Smoking puts you and people around you at risk for many serious long-lasting (chronic) diseases. Quitting smoking is hard, but it is one of the best things that you can do for your health. It is never too late to quit. What are the benefits of quitting smoking? When you quit smoking, you lower your risk for getting serious diseases and conditions. They can include:  Lung cancer or lung disease.  Heart disease.  Stroke.  Heart attack.  Not being able to have children (infertility).  Weak bones  (osteoporosis) and broken bones (fractures). If you have coughing, wheezing, and shortness of breath, those symptoms may get better when you quit. You may also get sick less often. If you are pregnant, quitting smoking can help to lower your chances of having a baby of low birth weight. What can I do to help me quit smoking? Talk with your doctor about what can help you quit smoking. Some things you can do (strategies) include:  Quitting smoking totally, instead of slowly cutting back how much you smoke over a period of time.  Going to in-person counseling. You are more likely to quit if you go to many counseling sessions.  Using resources and support systems, such as:  Online chats with a Social worker.  Phone quitlines.  Printed Furniture conservator/restorer.  Support groups or group counseling.  Text messaging programs.  Mobile phone apps or applications.  Taking medicines. Some of these medicines may have nicotine in them. If you are pregnant or breastfeeding, do not take any medicines to quit smoking unless your doctor says it is okay. Talk with your doctor about counseling or other things that can help you. Talk with your doctor about using more than one strategy at the same time, such as taking medicines while you are also going to in-person counseling. This can help make quitting easier. What things can I do to make it easier to quit? Quitting smoking might feel very hard at first, but there is a lot that you can do to make it easier. Take these steps:  Talk to your family and friends. Ask them to support and encourage you.  Call phone quitlines, reach out to support groups, or work with a Social worker.  Ask people who smoke to not smoke around you.  Avoid places that make you want (trigger) to smoke, such as:  Bars.  Parties.  Smoke-break areas at work.  Spend time with people who do not smoke.  Lower the stress in your life. Stress can make you want to smoke. Try these things to  help  your stress:  Getting regular exercise.  Deep-breathing exercises.  Yoga.  Meditating.  Doing a body scan. To do this, close your eyes, focus on one area of your body at a time from head to toe, and notice which parts of your body are tense. Try to relax the muscles in those areas.  Download or buy apps on your mobile phone or tablet that can help you stick to your quit plan. There are many free apps, such as QuitGuide from the [employer] Office manager for Disease Control and Prevention). You can find more support from smokefree.gov and other websites. This information is not intended to replace advice given to you by your health care provider. Make sure you discuss any questions you have with your health care provider. Document Released: 06/28/2009 Document Revised: 04/29/2016 Document Reviewed: 01/16/2015 Elsevier Interactive Patient Education  2017 Elsevier Inc.  Pain Score  Introduction: The pain score used by this practice is the Verbal Numerical Rating Scale (VNRS-11). This is an 11-point scale. It is for adults and children 10 years or older. There are significant differences in how the pain score is reported, used, and applied. Forget everything you learned in the past and learn this scoring system.  General Information: The scale should reflect your current level of pain. Unless you are specifically asked for the level of your worst pain, or your average pain. If you are asked for one of these two, then it should be understood that it is over the past 24 hours.  Basic Activities of Daily Living (ADL): Personal hygiene, dressing, eating, transferring, and using restroom.  Instructions: Most patients tend to report their level of pain as a combination of two factors, their physical pain and their psychosocial pain. This last one is also known as "suffering" and it is reflection of how physical pain affects you socially and psychologically. From now on, report them separately. From this  point on, when asked to report your pain level, report only your physical pain. Use the following table for reference.  Pain Clinic Pain Levels (0-5/10)  Pain Level Score Description  No Pain 0   Mild pain 1 Nagging, annoying, but does not interfere with basic activities of daily living (ADL). Patients are able to eat, bathe, get dressed, toileting (being able to get on and off the toilet and perform personal hygiene functions), transfer (move in and out of bed or a chair without assistance), and maintain continence (able to control bladder and bowel functions). Blood pressure and heart rate are unaffected. A normal heart rate for a healthy adult ranges from 60 to 100 bpm (beats per minute).   Mild to moderate pain 2 Noticeable and distracting. Impossible to hide from other people. More frequent flare-ups. Still possible to adapt and function close to normal. It can be very annoying and may have occasional stronger flare-ups. With discipline, patients may get used to it and adapt.   Moderate pain 3 Interferes significantly with activities of daily living (ADL). It becomes difficult to feed, bathe, get dressed, get on and off the toilet or to perform personal hygiene functions. Difficult to get in and out of bed or a chair without assistance. Very distracting. With effort, it can be ignored when deeply involved in activities.   Moderately severe pain 4 Impossible to ignore for more than a few minutes. With effort, patients may still be able to manage work or participate in some social activities. Very difficult to concentrate. Signs of autonomic nervous system discharge are evident:  dilated pupils (mydriasis); mild sweating (diaphoresis); sleep interference. Heart rate becomes elevated (>115 bpm). Diastolic blood pressure (lower number) rises above 100 mmHg. Patients find relief in laying down and not moving.   Severe pain 5 Intense and extremely unpleasant. Associated with frowning face and frequent  crying. Pain overwhelms the senses.  Ability to do any activity or maintain social relationships becomes significantly limited. Conversation becomes difficult. Pacing back and forth is common, as getting into a comfortable position is nearly impossible. Pain wakes you up from deep sleep. Physical signs will be obvious: pupillary dilation; increased sweating; goosebumps; brisk reflexes; cold, clammy hands and feet; nausea, vomiting or dry heaves; loss of appetite; significant sleep disturbance with inability to fall asleep or to remain asleep. When persistent, significant weight loss is observed due to the complete loss of appetite and sleep deprivation.  Blood pressure and heart rate becomes significantly elevated. Caution: If elevated blood pressure triggers a pounding headache associated with blurred vision, then the patient should immediately seek attention at an urgent or emergency care unit, as these may be signs of an impending stroke.    Emergency Department Pain Levels (6-10/10)  Emergency Room Pain 6 Severely limiting. Requires emergency care and should not be seen or managed at an outpatient pain management facility. Communication becomes difficult and requires great effort. Assistance to reach the emergency department may be required. Facial flushing and profuse sweating along with potentially dangerous increases in heart rate and blood pressure will be evident.   Distressing pain 7 Self-care is very difficult. Assistance is required to transport, or use restroom. Assistance to reach the emergency department will be required. Tasks requiring coordination, such as bathing and getting dressed become very difficult.   Disabling pain 8 Self-care is no longer possible. At this level, pain is disabling. The individual is unable to do even the most "basic" activities such as walking, eating, bathing, dressing, transferring to a bed, or toileting. Fine motor skills are lost. It is difficult to think  clearly.   Incapacitating pain 9 Pain becomes incapacitating. Thought processing is no longer possible. Difficult to remember your own name. Control of movement and coordination are lost.   The worst pain imaginable 10 At this level, most patients pass out from pain. When this level is reached, collapse of the autonomic nervous system occurs, leading to a sudden drop in blood pressure and heart rate. This in turn results in a temporary and dramatic drop in blood flow to the brain, leading to a loss of consciousness. Fainting is one of the body's self defense mechanisms. Passing out puts the brain in a calmed state and causes it to shut down for a while, in order to begin the healing process.    Summary: 1. Refer to this scale when providing [place] with your pain level. 2. Be accurate and careful when reporting your pain level. This will help with your care. 3. Over-reporting your pain level will lead to loss of credibility. 4. Even a level of 1/10 means that there is pain and will be treated at our facility. 5. High, inaccurate reporting will be documented as "Symptom Exaggeration", leading to loss of credibility and suspicions of possible secondary gains such as obtaining more narcotics, or wanting to appear disabled, for fraudulent reasons. 6. Only pain levels of 5 or below will be seen at our facility. 7. Pain levels of 6 and above will be sent to the Emergency Department and the appointment cancelled.  GENERAL RISKS AND COMPLICATIONS  What are the risk, side effects and possible complications? Generally speaking, most procedures are safe.  However, with any procedure there are risks, side effects, and the possibility of complications.  The risks and complications are dependent upon the sites that are lesioned, or the type of nerve block to be performed.  The closer the procedure is to the spine, the more serious the risks are.  Great care is taken when placing the radio frequency needles, block  needles or lesioning probes, but sometimes complications can occur. 1. Infection: Any time there is an injection through the skin, there is a risk of infection.  This is why sterile conditions are used for these blocks.  There are four possible types of infection. 1. Localized skin infection. 2. Central Nervous System Infection-This can be in the form of Meningitis, which can be deadly. 3. Epidural Infections-This can be in the form of an epidural abscess, which can cause pressure inside of the spine, causing compression of the spinal cord with subsequent paralysis. This would require an emergency surgery to decompress, and there are no guarantees that the patient would recover from the paralysis. 4. Discitis-This is an infection of the intervertebral discs.  It occurs in about 1% of discography procedures.  It is difficult to treat and it may lead to surgery.        2. Pain: the needles have to go through skin and soft tissues, will cause soreness.       3. Damage to internal structures:  The nerves to be lesioned may be near blood vessels or    other nerves which can be potentially damaged.       4. Bleeding: Bleeding is more common if the patient is taking blood thinners such as  aspirin, Coumadin, Ticiid, Plavix, etc., or if he/she have some genetic predisposition  such as hemophilia. Bleeding into the spinal canal can cause compression of the spinal  cord with subsequent paralysis.  This would require an emergency surgery to  decompress and there are no guarantees that the patient would recover from the  paralysis.       5. Pneumothorax:  Puncturing of a lung is a possibility, every time a needle is introduced in  the area of the chest or upper back.  Pneumothorax refers to free air around the  collapsed lung(s), inside of the thoracic cavity (chest cavity).  Another two possible  complications related to a similar event would include: Hemothorax and Chylothorax.   These are variations of the  Pneumothorax, where instead of air around the collapsed  lung(s), you may have blood or chyle, respectively.       6. Spinal headaches: They may occur with any procedures in the area of the spine.       7. Persistent CSF (Cerebro-Spinal Fluid) leakage: This is a rare problem, but may occur  with prolonged intrathecal or epidural catheters either due to the formation of a fistulous  track or a dural tear.       8. Nerve damage: By working so close to the spinal cord, there is always a possibility of  nerve damage, which could be as serious as a permanent spinal cord injury with  paralysis.       9. Death:  Although rare, severe deadly allergic reactions known as "Anaphylactic  reaction" can occur to any of the medications used.      10. Worsening of the symptoms:  We can always make thing worse.  What are the  chances of something like this happening? Chances of any of this occuring are extremely low.  By statistics, you have more of a chance of getting killed in a motor vehicle accident: while driving to the hospital than any of the above occurring .  Nevertheless, you should be aware that they are possibilities.  In general, it is similar to taking a shower.  Everybody knows that you can slip, hit your head and get killed.  Does that mean that you should not shower again?  Nevertheless always keep in mind that statistics do not mean anything if you happen to be on the wrong side of them.  Even if a procedure has a 1 (one) in a 1,000,000 (million) chance of going wrong, it you happen to be that one..Also, keep in mind that by statistics, you have more of a chance of having something go wrong when taking medications.  Who should not have this procedure? If you are on a blood thinning medication (e.g. Coumadin, Plavix, see list of "Blood Thinners"), or if you have an active infection going on, you should not have the procedure.  If you are taking any blood thinners, please inform your physician.  How  should I prepare for this procedure?  Do not eat or drink anything at least six hours prior to the procedure.  Bring a driver with you .  It cannot be a taxi.  Come accompanied by an adult that can drive you back, and that is strong enough to help you if your legs get weak or numb from the local anesthetic.  Take all of your medicines the morning of the procedure with just enough water to swallow them.  If you have diabetes, make sure that you are scheduled to have your procedure done first thing in the morning, whenever possible.  If you have diabetes, take only half of your insulin dose and notify our nurse that you have done so as soon as you arrive at the clinic.  If you are diabetic, but only take blood sugar pills (oral hypoglycemic), then do not take them on the morning of your procedure.  You may take them after you have had the procedure.  Do not take aspirin or any aspirin-containing medications, at least eleven (11) days prior to the procedure.  They may prolong bleeding.  Wear loose fitting clothing that may be easy to take off and that you would not mind if it got stained with Betadine or blood.  Do not wear any jewelry or perfume  Remove any nail coloring.  It will interfere with some of our monitoring equipment.  NOTE: Remember that this is not meant to be interpreted as a complete list of all possible complications.  Unforeseen problems may occur.  BLOOD THINNERS The following drugs contain aspirin or other products, which can cause increased bleeding during surgery and should not be taken for 2 weeks prior to and 1 week after surgery.  If you should need take something for relief of minor pain, you may take acetaminophen which is found in Tylenol,m Datril, Anacin-3 and Panadol. It is not blood thinner. The products listed below are.  Do not take any of the products listed below in addition to any listed on your instruction sheet.  A.P.C or A.P.C with Codeine Codeine  Phosphate Capsules #3 Ibuprofen Ridaura  ABC compound Congesprin Imuran rimadil  Advil Cope Indocin Robaxisal  Alka-Seltzer Effervescent Pain Reliever and Antacid Coricidin or Coricidin-D  Indomethacin Rufen  Alka-Seltzer plus Cold  Medicine Cosprin Ketoprofen S-A-C Tablets  Anacin Analgesic Tablets or Capsules Coumadin Korlgesic Salflex  Anacin Extra Strength Analgesic tablets or capsules CP-2 Tablets Lanoril Salicylate  Anaprox Cuprimine Capsules Levenox Salocol  Anexsia-D Dalteparin Magan Salsalate  Anodynos Darvon compound Magnesium Salicylate Sine-off  Ansaid Dasin Capsules Magsal Sodium Salicylate  Anturane Depen Capsules Marnal Soma  APF Arthritis pain formula Dewitt's Pills Measurin Stanback  Argesic Dia-Gesic Meclofenamic Sulfinpyrazone  Arthritis Bayer Timed Release Aspirin Diclofenac Meclomen Sulindac  Arthritis pain formula Anacin Dicumarol Medipren Supac  Analgesic (Safety coated) Arthralgen Diffunasal Mefanamic Suprofen  Arthritis Strength Bufferin Dihydrocodeine Mepro Compound Suprol  Arthropan liquid Dopirydamole Methcarbomol with Aspirin Synalgos  ASA tablets/Enseals Disalcid Micrainin Tagament  Ascriptin Doan's Midol Talwin  Ascriptin A/D Dolene Mobidin Tanderil  Ascriptin Extra Strength Dolobid Moblgesic Ticlid  Ascriptin with Codeine Doloprin or Doloprin with Codeine Momentum Tolectin  Asperbuf Duoprin Mono-gesic Trendar  Aspergum Duradyne Motrin or Motrin IB Triminicin  Aspirin plain, buffered or enteric coated Durasal Myochrisine Trigesic  Aspirin Suppositories Easprin Nalfon Trillsate  Aspirin with Codeine Ecotrin Regular or Extra Strength Naprosyn Uracel  Atromid-S Efficin Naproxen Ursinus  Auranofin Capsules Elmiron Neocylate Vanquish  Axotal Emagrin Norgesic Verin  Azathioprine Empirin or Empirin with Codeine Normiflo Vitamin E  Azolid Emprazil Nuprin Voltaren  Bayer Aspirin plain, buffered or children's or timed BC Tablets or powders Encaprin Orgaran  Warfarin Sodium  Buff-a-Comp Enoxaparin Orudis Zorpin  Buff-a-Comp with Codeine Equegesic Os-Cal-Gesic   Buffaprin Excedrin plain, buffered or Extra Strength Oxalid   Bufferin Arthritis Strength Feldene Oxphenbutazone   Bufferin plain or Extra Strength Feldene Capsules Oxycodone with Aspirin   Bufferin with Codeine Fenoprofen Fenoprofen Pabalate or Pabalate-SF   Buffets II Flogesic Panagesic   Buffinol plain or Extra Strength Florinal or Florinal with Codeine Panwarfarin   Buf-Tabs Flurbiprofen Penicillamine   Butalbital Compound Four-way cold tablets Penicillin   Butazolidin Fragmin Pepto-Bismol   Carbenicillin Geminisyn Percodan   Carna Arthritis Reliever Geopen Persantine   Carprofen Gold's salt Persistin   Chloramphenicol Goody's Phenylbutazone   Chloromycetin Haltrain Piroxlcam   Clmetidine heparin Plaquenil   Cllnoril Hyco-pap Ponstel   Clofibrate Hydroxy chloroquine Propoxyphen         Before stopping any of these medications, be sure to consult the physician who ordered them.  Some, such as Coumadin (Warfarin) are ordered to prevent or treat serious conditions such as "deep thrombosis", "pumonary embolisms", and other heart problems.  The amount of time that you may need off of the medication may also vary with the medication and the reason for which you were taking it.  If you are taking any of these medications, please make sure you notify your pain physician before you undergo any procedures.

## 2016-10-30 NOTE — Patient Instructions (Addendum)
Steps to Quit Smoking Smoking tobacco can be bad for your health. It can also affect almost every organ in your body. Smoking puts you and people around you at risk for many serious long-lasting (chronic) diseases. Quitting smoking is hard, but it is one of the best things that you can do for your health. It is never too late to quit. What are the benefits of quitting smoking? When you quit smoking, you lower your risk for getting serious diseases and conditions. They can include:  Lung cancer or lung disease.  Heart disease.  Stroke.  Heart attack.  Not being able to have children (infertility).  Weak bones (osteoporosis) and broken bones (fractures). If you have coughing, wheezing, and shortness of breath, those symptoms may get better when you quit. You may also get sick less often. If you are pregnant, quitting smoking can help to lower your chances of having a baby of low birth weight. What can I do to help me quit smoking? Talk with your doctor about what can help you quit smoking. Some things you can do (strategies) include:  Quitting smoking totally, instead of slowly cutting back how much you smoke over a period of time.  Going to in-person counseling. You are more likely to quit if you go to many counseling sessions.  Using resources and support systems, such as:  Online chats with a counselor.  Phone quitlines.  Printed self-help materials.  Support groups or group counseling.  Text messaging programs.  Mobile phone apps or applications.  Taking medicines. Some of these medicines may have nicotine in them. If you are pregnant or breastfeeding, do not take any medicines to quit smoking unless your doctor says it is okay. Talk with your doctor about counseling or other things that can help you. Talk with your doctor about using more than one strategy at the same time, such as taking medicines while you are also going to in-person counseling. This can help make quitting  easier. What things can I do to make it easier to quit? Quitting smoking might feel very hard at first, but there is a lot that you can do to make it easier. Take these steps:  Talk to your family and friends. Ask them to support and encourage you.  Call phone quitlines, reach out to support groups, or work with a counselor.  Ask people who smoke to not smoke around you.  Avoid places that make you want (trigger) to smoke, such as:  Bars.  Parties.  Smoke-break areas at work.  Spend time with people who do not smoke.  Lower the stress in your life. Stress can make you want to smoke. Try these things to help your stress:  Getting regular exercise.  Deep-breathing exercises.  Yoga.  Meditating.  Doing a body scan. To do this, close your eyes, focus on one area of your body at a time from head to toe, and notice which parts of your body are tense. Try to relax the muscles in those areas.  Download or buy apps on your mobile phone or tablet that can help you stick to your quit plan. There are many free apps, such as QuitGuide from the CDC (Centers for Disease Control and Prevention). You can find more support from smokefree.gov and other websites. This information is not intended to replace advice given to you by your health care provider. Make sure you discuss any questions you have with your health care provider. Document Released: 06/28/2009 Document Revised: 04/29/2016 Document   Reviewed: 01/16/2015 Elsevier Interactive Patient Education  2017 [employer]  Pain Score  Introduction: The pain score used by this practice is the Verbal Numerical Rating Scale (VNRS-11). This is an 11-point scale. It is for adults and children 10 years or older. There are significant differences in how the pain score is reported, used, and applied. Forget everything you learned in the past and learn this scoring system.  General Information: The scale should reflect your current level of pain.  Unless you are specifically asked for the level of your worst pain, or your average pain. If you are asked for one of these two, then it should be understood that it is over the past 24 hours.  Basic Activities of Daily Living (ADL): Personal hygiene, dressing, eating, transferring, and using restroom.  Instructions: Most patients tend to report their level of pain as a combination of two factors, their physical pain and their psychosocial pain. This last one is also known as "suffering" and it is reflection of how physical pain affects you socially and psychologically. From now on, report them separately. From this point on, when asked to report your pain level, report only your physical pain. Use the following table for reference.  Pain Clinic Pain Levels (0-5/10)  Pain Level Score Description  No Pain 0   Mild pain 1 Nagging, annoying, but does not interfere with basic activities of daily living (ADL). Patients are able to eat, bathe, get dressed, toileting (being able to get on and off the toilet and perform personal hygiene functions), transfer (move in and out of bed or a chair without assistance), and maintain continence (able to control bladder and bowel functions). Blood pressure and heart rate are unaffected. A normal heart rate for a healthy adult ranges from 60 to 100 bpm (beats per minute).   Mild to moderate pain 2 Noticeable and distracting. Impossible to hide from other people. More frequent flare-ups. Still possible to adapt and function close to normal. It can be very annoying and may have occasional stronger flare-ups. With discipline, patients may get used to it and adapt.   Moderate pain 3 Interferes significantly with activities of daily living (ADL). It becomes difficult to feed, bathe, get dressed, get on and off the toilet or to perform personal hygiene functions. Difficult to get in and out of bed or a chair without assistance. Very distracting. With effort, it can be ignored  when deeply involved in activities.   Moderately severe pain 4 Impossible to ignore for more than a few minutes. With effort, patients may still be able to manage work or participate in some social activities. Very difficult to concentrate. Signs of autonomic nervous system discharge are evident: dilated pupils (mydriasis); mild sweating (diaphoresis); sleep interference. Heart rate becomes elevated (>115 bpm). Diastolic blood pressure (lower number) rises above 100 mmHg. Patients find relief in laying down and not moving.   Severe pain 5 Intense and extremely unpleasant. Associated with frowning face and frequent crying. Pain overwhelms the senses.  Ability to do any activity or maintain social relationships becomes significantly limited. Conversation becomes difficult. Pacing back and forth is common, as getting into a comfortable position is nearly impossible. Pain wakes you up from deep sleep. Physical signs will be obvious: pupillary dilation; increased sweating; goosebumps; brisk reflexes; cold, clammy hands and feet; nausea, vomiting or dry heaves; loss of appetite; significant sleep disturbance with inability to fall asleep or to remain asleep. When persistent, significant weight loss is observed due to the  complete loss of appetite and sleep deprivation.  Blood pressure and heart rate becomes significantly elevated. Caution: If elevated blood pressure triggers a pounding headache associated with blurred vision, then the patient should immediately seek attention at an urgent or emergency care unit, as these may be signs of an impending stroke.    Emergency Department Pain Levels (6-10/10)  Emergency Room Pain 6 Severely limiting. Requires emergency care and should not be seen or managed at an outpatient pain management facility. Communication becomes difficult and requires great effort. Assistance to reach the emergency department may be required. Facial flushing and profuse sweating along with  potentially dangerous increases in heart rate and blood pressure will be evident.   Distressing pain 7 Self-care is very difficult. Assistance is required to transport, or use restroom. Assistance to reach the emergency department will be required. Tasks requiring coordination, such as bathing and getting dressed become very difficult.   Disabling pain 8 Self-care is no longer possible. At this level, pain is disabling. The individual is unable to do even the most "basic" activities such as walking, eating, bathing, dressing, transferring to a bed, or toileting. Fine motor skills are lost. It is difficult to think clearly.   Incapacitating pain 9 Pain becomes incapacitating. Thought processing is no longer possible. Difficult to remember your own name. Control of movement and coordination are lost.   The worst pain imaginable 10 At this level, most patients pass out from pain. When this level is reached, collapse of the autonomic nervous system occurs, leading to a sudden drop in blood pressure and heart rate. This in turn results in a temporary and dramatic drop in blood flow to the brain, leading to a loss of consciousness. Fainting is one of the body's self defense mechanisms. Passing out puts the brain in a calmed state and causes it to shut down for a while, in order to begin the healing process.    Summary: 1. Refer to this scale when providing [place] with your pain level. 2. Be accurate and careful when reporting your pain level. This will help with your care. 3. Over-reporting your pain level will lead to loss of credibility. 4. Even a level of 1/10 means that there is pain and will be treated at our facility. 5. High, inaccurate reporting will be documented as "Symptom Exaggeration", leading to loss of credibility and suspicions of possible secondary gains such as obtaining more narcotics, or wanting to appear disabled, for fraudulent reasons. 6. Only pain levels of 5 or below will be seen at  our facility. 7. Pain levels of 6 and above will be sent to the Emergency Department and the appointment cancelled.  GENERAL RISKS AND COMPLICATIONS  What are the risk, side effects and possible complications? Generally speaking, most procedures are safe.  However, with any procedure there are risks, side effects, and the possibility of complications.  The risks and complications are dependent upon the sites that are lesioned, or the type of nerve block to be performed.  The closer the procedure is to the spine, the more serious the risks are.  Great care is taken when placing the radio frequency needles, block needles or lesioning probes, but sometimes complications can occur. 1. Infection: Any time there is an injection through the skin, there is a risk of infection.  This is why sterile conditions are used for these blocks.  There are four possible types of infection. 1. Localized skin infection. 2. Central Nervous System Infection-This can be in the  form of Meningitis, which can be deadly. 3. Epidural Infections-This can be in the form of an epidural abscess, which can cause pressure inside of the spine, causing compression of the spinal cord with subsequent paralysis. This would require an emergency surgery to decompress, and there are no guarantees that the patient would recover from the paralysis. 4. Discitis-This is an infection of the intervertebral discs.  It occurs in about 1% of discography procedures.  It is difficult to treat and it may lead to surgery.        2. Pain: the needles have to go through skin and soft tissues, will cause soreness.       3. Damage to internal structures:  The nerves to be lesioned may be near blood vessels or    other nerves which can be potentially damaged.       4. Bleeding: Bleeding is more common if the patient is taking blood thinners such as  aspirin, Coumadin, Ticiid, Plavix, etc., or if he/she have some genetic predisposition  such as hemophilia.  Bleeding into the spinal canal can cause compression of the spinal  cord with subsequent paralysis.  This would require an emergency surgery to  decompress and there are no guarantees that the patient would recover from the  paralysis.       5. Pneumothorax:  Puncturing of a lung is a possibility, every time a needle is introduced in  the area of the chest or upper back.  Pneumothorax refers to free air around the  collapsed lung(s), inside of the thoracic cavity (chest cavity).  Another two possible  complications related to a similar event would include: Hemothorax and Chylothorax.   These are variations of the Pneumothorax, where instead of air around the collapsed  lung(s), you may have blood or chyle, respectively.       6. Spinal headaches: They may occur with any procedures in the area of the spine.       7. Persistent CSF (Cerebro-Spinal Fluid) leakage: This is a rare problem, but may occur  with prolonged intrathecal or epidural catheters either due to the formation of a fistulous  track or a dural tear.       8. Nerve damage: By working so close to the spinal cord, there is always a possibility of  nerve damage, which could be as serious as a permanent spinal cord injury with  paralysis.       9. Death:  Although rare, severe deadly allergic reactions known as "Anaphylactic  reaction" can occur to any of the medications used.      10. Worsening of the symptoms:  We can always make thing worse.  What are the chances of something like this happening? Chances of any of this occuring are extremely low.  By statistics, you have more of a chance of getting killed in a motor vehicle accident: while driving to the hospital than any of the above occurring .  Nevertheless, you should be aware that they are possibilities.  In general, it is similar to taking a shower.  Everybody knows that you can slip, hit your head and get killed.  Does that mean that you should not shower again?  Nevertheless always keep  in mind that statistics do not mean anything if you happen to be on the wrong side of them.  Even if a procedure has a 1 (one) in a 1,000,000 (million) chance of going wrong, it you happen to be that one..Also, keep in mind  that by statistics, you have more of a chance of having something go wrong when taking medications.  Who should not have this procedure? If you are on a blood thinning medication (e.g. Coumadin, Plavix, see list of "Blood Thinners"), or if you have an active infection going on, you should not have the procedure.  If you are taking any blood thinners, please inform your physician.  How should I prepare for this procedure?  Do not eat or drink anything at least six hours prior to the procedure.  Bring a driver with you .  It cannot be a taxi.  Come accompanied by an adult that can drive you back, and that is strong enough to help you if your legs get weak or numb from the local anesthetic.  Take all of your medicines the morning of the procedure with just enough water to swallow them.  If you have diabetes, make sure that you are scheduled to have your procedure done first thing in the morning, whenever possible.  If you have diabetes, take only half of your insulin dose and notify our nurse that you have done so as soon as you arrive at the clinic.  If you are diabetic, but only take blood sugar pills (oral hypoglycemic), then do not take them on the morning of your procedure.  You may take them after you have had the procedure.  Do not take aspirin or any aspirin-containing medications, at least eleven (11) days prior to the procedure.  They may prolong bleeding.  Wear loose fitting clothing that may be easy to take off and that you would not mind if it got stained with Betadine or blood.  Do not wear any jewelry or perfume  Remove any nail coloring.  It will interfere with some of our monitoring equipment.  NOTE: Remember that this is not meant to be interpreted as a  complete list of all possible complications.  Unforeseen problems may occur.  BLOOD THINNERS The following drugs contain aspirin or other products, which can cause increased bleeding during surgery and should not be taken for 2 weeks prior to and 1 week after surgery.  If you should need take something for relief of minor pain, you may take acetaminophen which is found in Tylenol,m Datril, Anacin-3 and Panadol. It is not blood thinner. The products listed below are.  Do not take any of the products listed below in addition to any listed on your instruction sheet.  A.P.C or A.P.C with Codeine Codeine Phosphate Capsules #3 Ibuprofen Ridaura  ABC compound Congesprin Imuran rimadil  Advil Cope Indocin Robaxisal  Alka-Seltzer Effervescent Pain Reliever and Antacid Coricidin or Coricidin-D  Indomethacin Rufen  Alka-Seltzer plus Cold Medicine Cosprin Ketoprofen S-A-C Tablets  Anacin Analgesic Tablets or Capsules Coumadin Korlgesic Salflex  Anacin Extra Strength Analgesic tablets or capsules CP-2 Tablets Lanoril Salicylate  Anaprox Cuprimine Capsules Levenox Salocol  Anexsia-D Dalteparin Magan Salsalate  Anodynos Darvon compound Magnesium Salicylate Sine-off  Ansaid Dasin Capsules Magsal Sodium Salicylate  Anturane Depen Capsules Marnal Soma  APF Arthritis pain formula Dewitt's Pills Measurin Stanback  Argesic Dia-Gesic Meclofenamic Sulfinpyrazone  Arthritis Bayer Timed Release Aspirin Diclofenac Meclomen Sulindac  Arthritis pain formula Anacin Dicumarol Medipren Supac  Analgesic (Safety coated) Arthralgen Diffunasal Mefanamic Suprofen  Arthritis Strength Bufferin Dihydrocodeine Mepro Compound Suprol  Arthropan liquid Dopirydamole Methcarbomol with Aspirin Synalgos  ASA tablets/Enseals Disalcid Micrainin Tagament  Ascriptin Doan's Midol Talwin  Ascriptin A/D Dolene Mobidin Tanderil  Ascriptin Extra Strength Dolobid Moblgesic Ticlid  Ascriptin  with Codeine Doloprin or Doloprin with Codeine  Momentum Tolectin  Asperbuf Duoprin Mono-gesic Trendar  Aspergum Duradyne Motrin or Motrin IB Triminicin  Aspirin plain, buffered or enteric coated Durasal Myochrisine Trigesic  Aspirin Suppositories Easprin Nalfon Trillsate  Aspirin with Codeine Ecotrin Regular or Extra Strength Naprosyn Uracel  Atromid-S Efficin Naproxen Ursinus  Auranofin Capsules Elmiron Neocylate Vanquish  Axotal Emagrin Norgesic Verin  Azathioprine Empirin or Empirin with Codeine Normiflo Vitamin E  Azolid Emprazil Nuprin Voltaren  Bayer Aspirin plain, buffered or children's or timed BC Tablets or powders Encaprin Orgaran Warfarin Sodium  Buff-a-Comp Enoxaparin Orudis Zorpin  Buff-a-Comp with Codeine Equegesic Os-Cal-Gesic   Buffaprin Excedrin plain, buffered or Extra Strength Oxalid   Bufferin Arthritis Strength Feldene Oxphenbutazone   Bufferin plain or Extra Strength Feldene Capsules Oxycodone with Aspirin   Bufferin with Codeine Fenoprofen Fenoprofen Pabalate or Pabalate-SF   Buffets II Flogesic Panagesic   Buffinol plain or Extra Strength Florinal or Florinal with Codeine Panwarfarin   Buf-Tabs Flurbiprofen Penicillamine   Butalbital Compound Four-way cold tablets Penicillin   Butazolidin Fragmin Pepto-Bismol   Carbenicillin Geminisyn Percodan   Carna Arthritis Reliever Geopen Persantine   Carprofen Gold's salt Persistin   Chloramphenicol Goody's Phenylbutazone   Chloromycetin Haltrain Piroxlcam   Clmetidine heparin Plaquenil   Cllnoril Hyco-pap Ponstel   Clofibrate Hydroxy chloroquine Propoxyphen         Before stopping any of these medications, be sure to consult the physician who ordered them.  Some, such as Coumadin (Warfarin) are ordered to prevent or treat serious conditions such as "deep thrombosis", "pumonary embolisms", and other heart problems.  The amount of time that you may need off of the medication may also vary with the medication and the reason for which you were taking it.  If you are  taking any of these medications, please make sure you notify your pain physician before you undergo any procedures.

## 2016-11-17 ENCOUNTER — Other Ambulatory Visit: Payer: Self-pay | Admitting: Pain Medicine

## 2016-11-17 DIAGNOSIS — M5410 Radiculopathy, site unspecified: Secondary | ICD-10-CM

## 2016-11-17 DIAGNOSIS — M79604 Pain in right leg: Secondary | ICD-10-CM

## 2016-11-17 DIAGNOSIS — M79605 Pain in left leg: Secondary | ICD-10-CM

## 2016-11-17 DIAGNOSIS — G8929 Other chronic pain: Secondary | ICD-10-CM

## 2016-11-17 DIAGNOSIS — M792 Neuralgia and neuritis, unspecified: Secondary | ICD-10-CM

## 2016-11-25 ENCOUNTER — Encounter: Payer: Medicaid Other | Admitting: Pain Medicine

## 2016-11-26 ENCOUNTER — Other Ambulatory Visit: Payer: Self-pay | Admitting: Pain Medicine

## 2016-11-26 DIAGNOSIS — M79604 Pain in right leg: Secondary | ICD-10-CM

## 2016-11-26 DIAGNOSIS — M79605 Pain in left leg: Secondary | ICD-10-CM

## 2016-11-26 DIAGNOSIS — M792 Neuralgia and neuritis, unspecified: Secondary | ICD-10-CM

## 2016-11-26 DIAGNOSIS — M5410 Radiculopathy, site unspecified: Secondary | ICD-10-CM

## 2016-11-26 DIAGNOSIS — G8929 Other chronic pain: Secondary | ICD-10-CM

## 2016-11-27 ENCOUNTER — Ambulatory Visit: Payer: Medicaid Other | Admitting: Pain Medicine

## 2016-12-09 ENCOUNTER — Encounter: Payer: Self-pay | Admitting: Pain Medicine

## 2016-12-09 ENCOUNTER — Ambulatory Visit: Payer: Medicaid Other | Attending: Pain Medicine | Admitting: Pain Medicine

## 2016-12-09 VITALS — BP 146/89 | HR 70 | Temp 98.2°F | Ht 68.0 in | Wt 165.0 lb

## 2016-12-09 DIAGNOSIS — M5410 Radiculopathy, site unspecified: Secondary | ICD-10-CM | POA: Diagnosis not present

## 2016-12-09 DIAGNOSIS — I252 Old myocardial infarction: Secondary | ICD-10-CM | POA: Insufficient documentation

## 2016-12-09 DIAGNOSIS — I959 Hypotension, unspecified: Secondary | ICD-10-CM | POA: Diagnosis not present

## 2016-12-09 DIAGNOSIS — M25512 Pain in left shoulder: Secondary | ICD-10-CM | POA: Diagnosis not present

## 2016-12-09 DIAGNOSIS — F411 Generalized anxiety disorder: Secondary | ICD-10-CM | POA: Insufficient documentation

## 2016-12-09 DIAGNOSIS — M79641 Pain in right hand: Secondary | ICD-10-CM | POA: Insufficient documentation

## 2016-12-09 DIAGNOSIS — F319 Bipolar disorder, unspecified: Secondary | ICD-10-CM | POA: Insufficient documentation

## 2016-12-09 DIAGNOSIS — G894 Chronic pain syndrome: Secondary | ICD-10-CM | POA: Diagnosis not present

## 2016-12-09 DIAGNOSIS — I2510 Atherosclerotic heart disease of native coronary artery without angina pectoris: Secondary | ICD-10-CM | POA: Insufficient documentation

## 2016-12-09 DIAGNOSIS — M79604 Pain in right leg: Secondary | ICD-10-CM

## 2016-12-09 DIAGNOSIS — M1990 Unspecified osteoarthritis, unspecified site: Secondary | ICD-10-CM | POA: Insufficient documentation

## 2016-12-09 DIAGNOSIS — K449 Diaphragmatic hernia without obstruction or gangrene: Secondary | ICD-10-CM | POA: Insufficient documentation

## 2016-12-09 DIAGNOSIS — K589 Irritable bowel syndrome without diarrhea: Secondary | ICD-10-CM | POA: Diagnosis not present

## 2016-12-09 DIAGNOSIS — E039 Hypothyroidism, unspecified: Secondary | ICD-10-CM | POA: Insufficient documentation

## 2016-12-09 DIAGNOSIS — I509 Heart failure, unspecified: Secondary | ICD-10-CM | POA: Insufficient documentation

## 2016-12-09 DIAGNOSIS — M8580 Other specified disorders of bone density and structure, unspecified site: Secondary | ICD-10-CM | POA: Insufficient documentation

## 2016-12-09 DIAGNOSIS — G8929 Other chronic pain: Secondary | ICD-10-CM

## 2016-12-09 DIAGNOSIS — M79605 Pain in left leg: Secondary | ICD-10-CM

## 2016-12-09 DIAGNOSIS — M25511 Pain in right shoulder: Secondary | ICD-10-CM | POA: Insufficient documentation

## 2016-12-09 DIAGNOSIS — Z96653 Presence of artificial knee joint, bilateral: Secondary | ICD-10-CM | POA: Diagnosis not present

## 2016-12-09 DIAGNOSIS — M25561 Pain in right knee: Secondary | ICD-10-CM | POA: Diagnosis present

## 2016-12-09 DIAGNOSIS — M797 Fibromyalgia: Secondary | ICD-10-CM | POA: Insufficient documentation

## 2016-12-09 DIAGNOSIS — K219 Gastro-esophageal reflux disease without esophagitis: Secondary | ICD-10-CM | POA: Diagnosis not present

## 2016-12-09 DIAGNOSIS — Z87442 Personal history of urinary calculi: Secondary | ICD-10-CM | POA: Insufficient documentation

## 2016-12-09 DIAGNOSIS — D869 Sarcoidosis, unspecified: Secondary | ICD-10-CM | POA: Insufficient documentation

## 2016-12-09 DIAGNOSIS — Z7901 Long term (current) use of anticoagulants: Secondary | ICD-10-CM | POA: Diagnosis not present

## 2016-12-09 DIAGNOSIS — M542 Cervicalgia: Secondary | ICD-10-CM | POA: Diagnosis not present

## 2016-12-09 DIAGNOSIS — Z8673 Personal history of transient ischemic attack (TIA), and cerebral infarction without residual deficits: Secondary | ICD-10-CM | POA: Insufficient documentation

## 2016-12-09 DIAGNOSIS — M545 Low back pain: Secondary | ICD-10-CM | POA: Insufficient documentation

## 2016-12-09 DIAGNOSIS — G35 Multiple sclerosis: Secondary | ICD-10-CM | POA: Diagnosis not present

## 2016-12-09 DIAGNOSIS — M79643 Pain in unspecified hand: Secondary | ICD-10-CM

## 2016-12-09 DIAGNOSIS — F410 Panic disorder [episodic paroxysmal anxiety] without agoraphobia: Secondary | ICD-10-CM | POA: Insufficient documentation

## 2016-12-09 DIAGNOSIS — I10 Essential (primary) hypertension: Secondary | ICD-10-CM | POA: Insufficient documentation

## 2016-12-09 DIAGNOSIS — F1190 Opioid use, unspecified, uncomplicated: Secondary | ICD-10-CM

## 2016-12-09 DIAGNOSIS — Z79891 Long term (current) use of opiate analgesic: Secondary | ICD-10-CM

## 2016-12-09 DIAGNOSIS — M069 Rheumatoid arthritis, unspecified: Secondary | ICD-10-CM | POA: Insufficient documentation

## 2016-12-09 DIAGNOSIS — M792 Neuralgia and neuritis, unspecified: Secondary | ICD-10-CM

## 2016-12-09 DIAGNOSIS — M25562 Pain in left knee: Secondary | ICD-10-CM | POA: Diagnosis present

## 2016-12-09 DIAGNOSIS — E785 Hyperlipidemia, unspecified: Secondary | ICD-10-CM | POA: Insufficient documentation

## 2016-12-09 DIAGNOSIS — M419 Scoliosis, unspecified: Secondary | ICD-10-CM | POA: Insufficient documentation

## 2016-12-09 MED ORDER — OXYCODONE HCL 5 MG PO TABS: 5.0000 mg | ORAL_TABLET | Freq: Four times a day (QID) | ORAL | 0 refills | Status: DC | PRN

## 2016-12-09 MED ORDER — PREGABALIN 50 MG PO CAPS: 50.0000 mg | ORAL_CAPSULE | Freq: Two times a day (BID) | ORAL | 0 refills | Status: DC

## 2016-12-09 NOTE — Patient Instructions (Addendum)
You have been given a script for oxycodone and Lyrica today.  You have been scheduled for a procedure.. You have been told not to eat or drink for 8 hours prior to procedure, bring a driver, take blood pressure med the morning of procedure if applicable.  Stop Coumadin for 5 days prior to procedure.  We have already gotten permission from your PCP  Preparing for Procedure with Sedation Instructions: . Oral Intake: Do not eat or drink anything for at least 8 hours prior to your procedure. . Transportation: Public transportation is not allowed. Bring an adult driver. The driver must be physically present in our waiting room before any procedure can be started. Mar[facility] Kitchen Physical Assistance: Bring an adult physically capable of assisting you, in the event you need help. This adult should keep you company at home for at least 6 hours after the procedure. . Blood Pressure Medicine: Take your blood pressure medicine with a sip of water the morning of the procedure. . Blood thinners:  . Diabetics on insulin: Notify the staff so that you can be scheduled 1st case in the morning. If your diabetes requires high dose insulin, take only  of your normal insulin dose the morning of the procedure and notify the staff that you have done so. . Preventing infections: Shower with an antibacterial soap the morning of your procedure. . Build-up your immune system: Take 1000 mg of Vitamin C with every meal (3 times a day) the day prior to your procedure. Marland Kitchen Antibiotics: Inform the staff if you have a condition or reason that requires you to take antibiotics before dental procedures. . Pregnancy: If you are pregnant, call and cancel the procedure. . Sickness: If you have a cold, fever, or any active infections, call and cancel the procedure. . Arrival: You must be in the facility at least 30 minutes prior to your scheduled procedure. . Children: Do not bring children with you. . Dress appropriately: Bring dark clothing that you  would not mind if they get stained. . Valuables: Do not bring any jewelry or valuables. Procedure appointments are reserved for interventional treatments only. Marland Kitchen No Prescription Refills. . No medication changes will be discussed during procedure appointments. . No disability issues will be discussed.  ____________________________________________________________________________________________  Preparing for Procedure with Sedation Instructions: . Oral Intake: Do not eat or drink anything for at least 8 hours prior to your procedure. . Transportation: Public transportation is not allowed. Bring an adult driver. The driver must be physically present in our waiting room before any procedure can be started. Marland Kitchen Physical Assistance: Bring an adult capable of physically assisting you, in the event you need help. . Blood Pressure Medicine: Take your blood pressure medicine with a sip of water the morning of the procedure. . Insulin: Take only  of your normal insulin dose. . Preventing infections: Shower with an antibacterial soap the morning of your procedure. . Build-up your immune system: Take 1000 mg of Vitamin C with every meal (3 times a day) the day prior to your procedure. . Pregnancy: If you are pregnant, call and cancel the procedure. . Sickness: If you have a cold, fever, or any active infections, call and cancel the procedure. . Arrival: You must be in the facility at least 30 minutes prior to your scheduled procedure. . Children: Do not bring children with you. . Dress appropriately: Bring dark clothing that you would not mind if they get stained. . Valuables: Do not bring any jewelry or valuables. Procedure  appointments are reserved for interventional treatments only. Marland Kitchen No Prescription Refills. . No medication changes will be discussed during procedure appointments. No disability issues will be discussed. Knee Injection A knee injection is a procedure to get medicine into your knee  joint. Your health care provider puts a needle into the joint and injects medicine with an attached syringe. The injected medicine may relieve the pain, swelling, and stiffness of arthritis. The injected medicine may also help to lubricate and cushion your knee joint. You may need more than one injection. Tell a health care provider about: Any allergies you have. All medicines you are taking, including vitamins, herbs, eye drops, creams, and over-the-counter medicines. Any problems you or family members have had with anesthetic medicines. Any blood disorders you have. Any surgeries you have had. Any medical conditions you have. What are the risks? Generally, this is a safe procedure. However, problems may occur, including: Infection. Bleeding. Worsening symptoms. Damage to the area around your knee. Allergic reaction to any of the medicines. Skin reactions from repeated injections. What happens before the procedure? Ask your health care provider about changing or stopping your regular medicines. This is especially important if you are taking diabetes medicines or blood thinners. Plan to have someone take you home after the procedure. What happens during the procedure? You will sit or lie down in a position for your knee to be treated. The skin over your kneecap will be cleaned with a germ-killing solution (antiseptic). You will be given a medicine that numbs the area (local anesthetic). You may feel some stinging. After your knee becomes numb, you will have a second injection. This is the medicine. This needle is carefully placed between your kneecap and your knee. The medicine is injected into the joint space. At the end of the procedure, the needle will be removed. A bandage (dressing) may be placed over the injection site. The procedure may vary among health care providers and hospitals. What happens after the procedure? You may have to move your knee through its full range of motion.  This helps to get all of the medicine into your joint space. Your blood pressure, heart rate, breathing rate, and blood oxygen level will be monitored often until the medicines you were given have worn off. You will be watched to make sure that you do not have a reaction to the injected medicine. This information is not intended to replace advice given to you by your health care provider. Make sure you discuss any questions you have with your health care provider. Document Released: 11/23/2006 Document Revised: 02/01/2016 Document Reviewed: 07/12/2014 [employer] Interactive Patient Education  2017 [employer]. .

## 2016-12-09 NOTE — Progress Notes (Signed)
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 immune system: Take 1000 mg of Vitamin C with every meal (3 times a day) the day prior to your procedure. [facility] Antibiotics: Inform the staff if you have a condition or reason that requires you to take antibiotics before dental procedures. . Pregnancy: If you are pregnant, call and cancel the procedure. . Sickness: If you have a cold, fever, or any active infections, call and cancel the procedure. . Arrival: You must be in the facility at least 30 minutes prior to your scheduled procedure. . Children: Do not bring children with you. . Dress appropriately: Bring dark clothing that you would not mind if they get stained. . Valuables: Do not bring any jewelry or valuables. Procedure appointments are reserved for interventional treatments only. [facility] No Prescription Refills. . No medication changes will be discussed during procedure appointments. . No disability issues will be discussed.  ____________________________________________________________________________________________  Preparing for Procedure with Sedation Instructions: . Oral Intake: Do not eat  or drink anything for at least 8 hours prior to your procedure. . Transportation: Public transportation is not allowed. Bring an adult driver. The driver must be physically present in our waiting room before any procedure can be started. [facility] Physical Assistance: Bring an adult capable of physically assisting you, in the event you need help. . Blood Pressure Medicine: Take your blood pressure medicine with a sip of water the morning of the procedure. . Insulin: Take only  of your normal insulin dose. . Preventing infections:  Shower with an antibacterial soap the morning of your procedure. . Build-up your immune system: Take 1000 mg of Vitamin C with every meal (3 times a day) the day prior to your procedure. . Pregnancy: If you are pregnant, call and cancel the procedure. . Sickness: If you have a cold, fever, or any active infections, call and cancel the procedure. . Arrival: You must be in the facility at least 30 minutes prior to your scheduled procedure. . Children: Do not bring children with you. . Dress appropriately: Bring dark clothing that you would not mind if they get stained. . Valuables: Do not bring any jewelry or valuables. Procedure appointments are reserved for interventional treatments only. [facility] No Prescription Refills. . No medication changes will be discussed during procedure appointments. No disability issues will be discussed. Knee Injection A knee injection is a procedure to get medicine into your knee joint. Your health care provider puts a needle into the joint and injects medicine with an attached syringe. The injected medicine may relieve the pain, swelling, and stiffness of arthritis. The injected medicine may also help to lubricate and cushion your knee joint. You may need more than one injection. Tell a health care provider about: Any allergies you have. All medicines you are taking, including vitamins, herbs, eye drops, creams, and over-the-counter medicines. Any  problems you or family members have had with anesthetic medicines. Any blood disorders you have. Any surgeries you have had. Any medical conditions you have. What are the risks? Generally, this is a safe procedure. However, problems may occur, including: Infection. Bleeding. Worsening symptoms. Damage to the area around your knee. Allergic reaction to any of the medicines. Skin reactions from repeated injections. What happens before the procedure? Ask your health care provider about changing or stopping your regular medicines. This is especially important if you are taking diabetes medicines or blood thinners. Plan to have someone take you home after the procedure. What happens during the procedure? You will sit or lie down in a position for your knee to be treated. The skin over your kneecap will be cleaned with a germ-killing solution (antiseptic). You will be given a medicine that numbs the area (local anesthetic). You may feel some stinging. After your knee becomes numb, you will have a second injection. This is the medicine. This needle is carefully placed between your kneecap and your knee. The medicine is injected into the joint space. At the end of the procedure, the needle will be removed. A bandage (dressing) may be placed over the injection site. The procedure may vary among health care providers and hospitals. What happens after the procedure? You may have to move your knee through its full range of motion. This helps to get all of the medicine into your joint space. Your blood pressure, heart rate, breathing rate, and blood oxygen level will be monitored often until the medicines you were given have worn off. You will be watched to make sure that you do not have a reaction to the injected medicine. This information is not intended to replace advice given to you by your health care provider. Make sure you discuss any questions you have with your health care provider. Document  Released: 11/23/2006 Document Revised: 02/01/2016 Document Reviewed: 07/12/2014 Elsevier Interactive Patient Education  2017 [employer]. .

## 2016-12-09 NOTE — Progress Notes (Signed)
Nursing Pain Medication Assessment:  Safety precautions to be maintained throughout the outpatient stay will include: orient to surroundings, keep bed in low position, maintain call bell within reach at all times, provide assistance with transfer out of bed and ambulation.  Medication Inspection Compliance: Pill count conducted under aseptic conditions, in front of the patient. Neither the pills nor the bottle was removed from the patient's sight at any time. Once count was completed pills were immediately returned to the patient in their original bottle.  Medication: Oxycodone IR Pill/Patch Count: 0 of 120 pills remain Bottle Appearance: Standard pharmacy container. Clearly labeled. Filled Date: 02 /22 / 2018 Last Medication intake:  Ran out of medicine more than 48 hours ago

## 2016-12-12 LAB — TOXASSURE SELECT 13 (MW), URINE

## 2016-12-15 ENCOUNTER — Ambulatory Visit
Admission: RE | Admit: 2016-12-15 | Discharge: 2016-12-15 | Disposition: A | Discharge status: Home / Self Care | Payer: Medicaid Other | Source: Ambulatory Visit | Attending: Pain Medicine | Admitting: Pain Medicine

## 2016-12-15 ENCOUNTER — Encounter: Payer: Self-pay | Admitting: Pain Medicine

## 2016-12-15 ENCOUNTER — Ambulatory Visit (HOSPITAL_BASED_OUTPATIENT_CLINIC_OR_DEPARTMENT_OTHER): Payer: Medicaid Other | Admitting: Pain Medicine

## 2016-12-15 VITALS — BP 147/91 | HR 62 | Temp 98.6°F | Resp 14 | Ht 68.0 in | Wt 169.0 lb

## 2016-12-15 DIAGNOSIS — M25562 Pain in left knee: Secondary | ICD-10-CM

## 2016-12-15 DIAGNOSIS — Z96653 Presence of artificial knee joint, bilateral: Secondary | ICD-10-CM | POA: Insufficient documentation

## 2016-12-15 DIAGNOSIS — G8929 Other chronic pain: Secondary | ICD-10-CM

## 2016-12-15 DIAGNOSIS — M0579 Rheumatoid arthritis with rheumatoid factor of multiple sites without organ or systems involvement: Secondary | ICD-10-CM

## 2016-12-15 DIAGNOSIS — M25561 Pain in right knee: Secondary | ICD-10-CM | POA: Diagnosis not present

## 2016-12-15 MED ORDER — METHYLPREDNISOLONE ACETATE 40 MG/ML IJ SUSP
40.0000 mg | Freq: Once | INTRAMUSCULAR | Status: AC
  Administered 2016-12-15: 40 mg
  Filled 2016-12-15: qty 1

## 2016-12-15 MED ORDER — FENTANYL CITRATE (PF) 100 MCG/2ML IJ SOLN
25.0000 ug | INTRAMUSCULAR | Status: DC | PRN
  Administered 2016-12-15: 50 ug via INTRAVENOUS
  Filled 2016-12-15: qty 2

## 2016-12-15 MED ORDER — MIDAZOLAM HCL 5 MG/5ML IJ SOLN
1.0000 mg | INTRAMUSCULAR | Status: DC | PRN
  Administered 2016-12-15: 1 mg via INTRAVENOUS
  Filled 2016-12-15: qty 5

## 2016-12-15 MED ORDER — LACTATED RINGERS IV SOLN
1000.0000 mL | Freq: Once | INTRAVENOUS | Status: AC
  Administered 2016-12-15: 1000 mL via INTRAVENOUS

## 2016-12-15 MED ORDER — ROPIVACAINE HCL 2 MG/ML IJ SOLN: 9.0000 mL | Freq: Once | INTRAMUSCULAR | Status: DC

## 2016-12-15 MED ORDER — LIDOCAINE HCL (PF) 1 % IJ SOLN: 10.0000 mL | Freq: Once | INTRAMUSCULAR | Status: DC

## 2016-12-15 MED ORDER — ROPIVACAINE HCL 2 MG/ML IJ SOLN
9.0000 mL | Freq: Once | INTRAMUSCULAR | Status: DC
  Filled 2016-12-15: qty 20

## 2016-12-15 NOTE — Progress Notes (Signed)
Safety precautions to be maintained throughout the outpatient stay will include: orient to surroundings, keep bed in low position, maintain call bell within reach at all times, provide assistance with transfer out of bed and ambulation.  

## 2016-12-15 NOTE — Progress Notes (Signed)
Patient's Name: Brett Fernandez  MRN: [id number]  Referring Provider: Delano Metz, MD  DOB: 03-21-1961  PCP: Dortha Kern, MD  DOS: 12/15/2016  Note by: Sydnee Levans. Laban Emperor, MD  Service setting: Ambulatory outpatient  Location: [facility] (AMB) Pain Management Facility  Visit type: Procedure  Specialty: Interventional Pain Management  Patient type: Established   Primary Reason for Visit: Interventional Pain Management Treatment. CC: Knee Pain (bilaterally)  Procedure:  Anesthesia, Analgesia, Anxiolysis:  Type: Therapeutic Superior-lateral, Superior-medial, and Inferior-medial, Genicular Nerves Block. (CPT [phone]) Region: Lateral, Anterior, and Medial aspects of the knee joint, above and below the knee joint proper. Level: Superior and inferior to the knee joint. Laterality: Bilateral  Type: Local Anesthesia with Moderate (Conscious) Sedation Local Anesthetic: Lidocaine 1% Route: Intravenous (IV) IV Access: Secured Sedation: Meaningful verbal contact was maintained at all times during the procedure  Indication(s): Analgesia and Anxiety  Indications: 1. 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 History of total knee replacement (Bilateral)   3. Rheumatoid arthritis involving multiple sites with positive rheumatoid factor (HCC)    Pain Score: Pre-procedure: 3 /10 Post-procedure: 5 /10  Pre-op Assessment:  Previous date of service: 12/09/16 Service provided: Med Refill Brett Fernandez is a 56 y.o. (year old), male patient, seen today for interventional treatment. He  has a past surgical history that includes Hernia repair; Inner ear surgery; Appendectomy; Hemorrhoidectomy with hemorrhoid banding; colon polyps; Joint replacement (Left); and Total knee arthroplasty (Right, 11/01/2015). His primarily concern today is the Knee Pain (bilaterally)  Initial Vital Signs: Blood pressure 124/80, pulse 79, temperature 97.7 F (36.5 C), temperature source Oral, resp. rate 16, height  5\' 8"  (1.727 m), weight 169 lb (76.7 kg), SpO2 92 %. BMI: 25.70 kg/m  Risk Assessment: Allergies: Reviewed. He is allergic to aspirin; bee venom; and sulfa antibiotics.  Allergy Precautions: None required Coagulopathies: "Reviewed. None identified.  Blood-thinner therapy: None at this time Active Infection(s): Reviewed. None identified. Brett Fernandez is afebrile  Site Confirmation: Brett Fernandez was asked to confirm the procedure and laterality before marking the site Procedure checklist: Completed Consent: Before the procedure and under the influence of no sedative(s), amnesic(s), or anxiolytics, the patient was informed of the treatment options, risks and possible complications. To fulfill our ethical and legal obligations, as recommended by the American Medical Association's Code of Ethics, I have informed the patient of my clinical impression; the nature and purpose of the treatment or procedure; the risks, benefits, and possible complications of the intervention; the alternatives, including doing nothing; the risk(s) and benefit(s) of the alternative treatment(s) or procedure(s); and the risk(s) and benefit(s) of doing nothing. The patient was provided information about the general risks and possible complications associated with the procedure. These may include, but are not limited to: failure to achieve desired goals, infection, bleeding, organ or nerve damage, allergic reactions, paralysis, and death. In addition, the patient was informed of those risks and complications associated to the procedure, such as failure to decrease pain; infection; bleeding; organ or nerve damage with subsequent damage to sensory, motor, and/or autonomic systems, resulting in permanent pain, numbness, and/or weakness of one or several areas of the body; allergic reactions; (i.e.: anaphylactic reaction); and/or death. Furthermore, the patient was informed of those risks and complications associated with the medications.  These include, but are not limited to: allergic reactions (i.e.: anaphylactic or anaphylactoid reaction(s)); adrenal axis suppression; blood sugar elevation that in diabetics may result in ketoacidosis or comma; water retention that in  patients with history of congestive heart failure may result in shortness of breath, pulmonary edema, and decompensation with resultant heart failure; weight gain; swelling or edema; medication-induced neural toxicity; particulate matter embolism and blood vessel occlusion with resultant organ, and/or nervous system infarction; and/or aseptic necrosis of one or more joints. Finally, the patient was informed that Medicine is not an exact science; therefore, there is also the possibility of unforeseen or unpredictable risks and/or possible complications that may result in a catastrophic outcome. The patient indicated having understood very clearly. We have given the patient no guarantees and we have made no promises. Enough time was given to the patient to ask questions, all of which were answered to the patient's satisfaction. Brett Fernandez has indicated that he wanted to continue with the procedure. Attestation: I, the ordering provider, attest that I have discussed with the patient the benefits, risks, side-effects, alternatives, likelihood of achieving goals, and potential problems during recovery for the procedure that I have provided informed consent. Date: 12/15/2016; Time: 9:45 AM  Pre-Procedure Preparation:  Monitoring: As per clinic protocol. Respiration, ETCO2, SpO2, BP, heart rate and rhythm monitor placed and checked for adequate function Safety Precautions: Patient was assessed for positional comfort and pressure points before starting the procedure. Time-out: I initiated and conducted the "Time-out" before starting the procedure, as per protocol. The patient was asked to participate by confirming the accuracy of the "Time Out" information. Verification of the correct  person, site, and procedure were performed and confirmed by me, the nursing staff, and the patient. "Time-out" conducted as per Joint Commission's Universal Protocol (UP.01.01.01). "Time-out" Date & Time: 12/15/2016; 1029 hrs.  Description of Procedure Process:  Position: Supine Target Area: For Genicular Nerve block(s), the targets are: the superior-lateral genicular nerve, located in the lateral distal portion of the femoral shaft as it curves to form the lateral epicondyle, in the region of the distal femoral metaphysis; the superior-medial genicular nerve, located in the medial distal portion of the femoral shaft as it curves to form the medial epicondyle; and the inferior-medial genicular nerve, located in the medial, proximal portion of the tibial shaft, as it curves to form the medial epicondyle, in the region of the proximal tibial metaphysis. Approach: Anterior, ipsilateral approach. Area Prepped: Entire knee area, from mid-thigh to mid-shin, lateral, anterior, and medial aspects. Prepping solution: ChloraPrep (2% chlorhexidine gluconate and 70% isopropyl alcohol) Safety Precautions: Aspiration looking for blood return was conducted prior to all injections. At no point did we inject any substances, as a needle was being advanced. No attempts were made at seeking any paresthesias. Safe injection practices and needle disposal techniques used. Medications properly checked for expiration dates. SDV (single dose vial) medications used. Latex Allergy precautions taken.   Description of the Procedure: Protocol guidelines were followed. The patient was placed in position over the procedure table. The target area was identified and the area prepped in the usual manner. Skin desensitized using vapocoolant spray. Skin & deeper tissues infiltrated with local anesthetic. Appropriate amount of time allowed to pass for local anesthetics to take effect. The procedure needles were then advanced to the target area.  Proper needle placement secured. Negative aspiration confirmed. Solution injected in intermittent fashion, asking for systemic symptoms every 0.5cc of injectate. The needles were then removed and the area cleansed, making sure to leave some of the prepping solution back to take advantage of its long term bactericidal properties. Vitals:   12/15/16 1045 12/15/16 1055 12/15/16 1105 12/15/16 1115  BP: Marland Kitchen)  136/99 (!) 148/89 (!) 155/94 (!) 147/91  Pulse: 62 62 61 62  Resp: 15 14 13 14   Temp:  98.6 F (37 C)    TempSrc:      SpO2: 94% 95% 95% 95%  Weight:      Height:        Start Time: 1029 hrs. End Time: 1043 hrs. Materials:  Needle(s) Type: Regular needle Gauge: 22G Length: 3.5-in Medication(s): We administered fentaNYL, lactated ringers, midazolam, methylPREDNISolone acetate, and methylPREDNISolone acetate. Please see chart orders for dosing details.  Imaging Guidance (Non-Spinal):  Type of Imaging Technique: Fluoroscopy Guidance (Non-Spinal) Indication(s): Assistance in needle guidance and placement for procedures requiring needle placement in or near specific anatomical locations not easily accessible without such assistance. Exposure Time: Please see nurses notes. Contrast: Before injecting any contrast, we confirmed that the patient did not have an allergy to iodine, shellfish, or radiological contrast. Once satisfactory needle placement was completed at the desired level, radiological contrast was injected. Contrast injected under live fluoroscopy. No contrast complications. See chart for type and volume of contrast used. Fluoroscopic Guidance: I was personally present during the use of fluoroscopy. "Tunnel Vision Technique" used to obtain the best possible view of the target area. Parallax error corrected before commencing the procedure. "Direction-depth-direction" technique used to introduce the needle under continuous pulsed fluoroscopy. Once target was reached, antero-posterior,  oblique, and lateral fluoroscopic projection used confirm needle placement in all planes. Images permanently stored in EMR. Interpretation: I personally interpreted the imaging intraoperatively. Adequate needle placement confirmed in multiple planes. Appropriate spread of contrast into desired area was observed. No evidence of afferent or efferent intravascular uptake. Permanent images saved into the patient's record.  Antibiotic Prophylaxis:  Indication(s): None identified Antibiotic given: None  Post-operative Assessment:  EBL: None Complications: No immediate post-treatment complications observed by team, or reported by patient. Note: The patient tolerated the entire procedure well. A repeat set of vitals were taken after the procedure and the patient was kept under observation following institutional policy, for this type of procedure. Post-procedural neurological assessment was performed, showing return to baseline, prior to discharge. The patient was provided with post-procedure discharge instructions, including a section on how to identify potential problems. Should any problems arise concerning this procedure, the patient was given instructions to immediately contact us, at any time, without hesitation. In any case, we plan to contact the patient by telephone for a follow-up status report regarding this interventional procedure. Comments:  No additional relevant information.  Plan of Care  Disposition: Discharge home  Discharge Date & Time: 12/15/2016; 1116 hrs.  Physician-requested Follow-up:  Return in about 2 weeks (around 12/29/2016) for Post-Procedure evaluation.  Future Appointments Date Time Provider Department Center  01/06/2017 11:00 AM Delano Metz, MD [facility] None   Medications ordered for procedure: Meds ordered this encounter  Medications  . fentaNYL (SUBLIMAZE) injection 25-50 mcg    Make sure Narcan is available in the pyxis when using this medication. In the  event of respiratory depression (RR< 8/min): Titrate NARCAN (naloxone) in increments of 0.1 to 0.2 mg IV at 2-3 minute intervals, until desired degree of reversal.  . lactated ringers infusion 1,000 mL  . midazolam (VERSED) 5 MG/5ML injection 1-2 mg    Make sure Flumazenil is available in the pyxis when using this medication. If oversedation occurs, administer 0.2 mg IV over 15 sec. If after 45 sec no response, administer 0.2 mg again over 1 min; may repeat at 1 min intervals; not to exceed  4 doses (1 mg)  . ropivacaine (PF) 2 mg/mL (0.2%) (NAROPIN) injection 9 mL  . lidocaine (PF) (XYLOCAINE) 1 % injection 10 mL  . methylPREDNISolone acetate (DEPO-MEDROL) injection 40 mg  . methylPREDNISolone acetate (DEPO-MEDROL) injection 40 mg  . lidocaine (PF) (XYLOCAINE) 1 % injection 10 mL  . ropivacaine (PF) 2 mg/mL (0.2%) (NAROPIN) injection 9 mL   Medications administered: We administered fentaNYL, lactated ringers, midazolam, methylPREDNISolone acetate, and methylPREDNISolone acetate.  See the medical record for exact dosing, route, and time of administration.  Lab-work, Procedure(s), & Referral(s) Ordered: Orders Placed This Encounter  Procedures  . DG C-Arm 1-60 Min-No Report  . Discharge instructions  . Follow-up  . Informed Consent Details: Transcribe to consent form and obtain patient signature  . Provider attestation of informed consent for procedure/surgical case  . Verify informed consent   Imaging Ordered: No results found for this or any previous visit. New Prescriptions   No medications on file   Primary Care Physician: Dortha Kern, MD Location: [facility] Note by: Sydnee Levans. Laban Emperor, M.D, DABA, DABAPM, DABPM, DABIPP, FIPP Date: 12/15/2016; Time: 11:36 AM  Disclaimer:  Medicine is not an exact science. The only guarantee in medicine is that nothing is guaranteed. It is important to note that the decision to proceed with this intervention was  based on the information collected from the patient. The Data and conclusions were drawn from the patient's questionnaire, the interview, and the physical examination. Because the information was provided in large part by the patient, it cannot be guaranteed that it has not been purposely or unconsciously manipulated. Every effort has been made to obtain as much relevant data as possible for this evaluation. It is important to note that the conclusions that lead to this procedure are derived in large part from the available data. Always take into account that the treatment will also be dependent on availability of resources and existing treatment guidelines, considered by other Pain Management Practitioners as being common knowledge and practice, at the time of the intervention. For Medico-Legal purposes, it is also important to point out that variation in procedural techniques and pharmacological choices are the acceptable norm. The indications, contraindications, technique, and results of the above procedure should only be interpreted and judged by a Board-Certified Interventional Pain Specialist with extensive familiarity and expertise in the same exact procedure and technique. Attempts at providing opinions without similar or greater experience and expertise than that of the treating physician will be considered as inappropriate and unethical, and shall result in a formal complaint to the state medical board and applicable specialty societies.  Instructions provided at this appointment: Patient Instructions  Post-Procedure instructions Instructions:  Apply ice: Fill a plastic sandwich bag with crushed ice. Cover it with a small towel and apply to injection site. Apply for 15 minutes then remove x 15 minutes. Repeat sequence on day of procedure, until you go to bed. The purpose is to minimize swelling and discomfort after procedure.  Apply heat: Apply heat to procedure site starting the day following the  procedure. The purpose is to treat any soreness and discomfort from the procedure.  Food intake: Start with clear liquids (like water) and advance to regular food, as tolerated.   Physical activities: Keep activities to a minimum for the first 8 hours after the procedure.   Driving: If you have received any sedation, you are not allowed to drive for 24 hours after your procedure.  Blood thinner: Restart your blood  thinner 6 hours after your procedure. (Only for those taking blood thinners)  Insulin: As soon as you can eat, you may resume your normal dosing schedule. (Only for those taking insulin)  Infection prevention: Keep procedure site clean and dry.  Post-procedure Pain Diary: Extremely important that this be done correctly and accurately. Recorded information will be used to determine the next step in treatment.  Pain evaluated is that of treated area only. Do not include pain from an untreated area.  Complete every hour, on the hour, for the initial 8 hours. Set an alarm to help you do this part accurately.  Do not go to sleep and have it completed later. It will not be accurate.  Follow-up appointment: Keep your follow-up appointment after the procedure. Usually 2 weeks for most procedures. (6 weeks in the case of radiofrequency.) Bring you pain diary.  Expect:  From numbing medicine (AKA: Local Anesthetics): Numbness or decrease in pain.  Onset: Full effect within 15 minutes of injected.  Duration: It will depend on the type of local anesthetic used. On the average, 1 to 8 hours.   From steroids: Decrease in swelling or inflammation. Once inflammation is improved, relief of the pain will follow.  Onset of benefits: Depends on the amount of swelling present. The more swelling, the longer it will take for the benefits to be seen.   Duration: Steroids will stay in the system x 2 weeks. Duration of benefits will depend on multiple posibilities including persistent irritating  factors.  From procedure: Some discomfort is to be expected once the numbing medicine wears off. This should be minimal if ice and heat are applied as instructed. Call if:  You experience numbness and weakness that gets worse with time, as opposed to wearing off.  New onset bowel or bladder incontinence. (Spinal procedures only)  Emergency Numbers:  Durning business hours (Monday - Thursday, 8:00 AM - 4:00 PM) (Friday, 9:00 AM - 12:00 Noon): [phone]  After hours: [phone]   __________________________________________________________________________________________   Pain Management Discharge Instructions  General Discharge Instructions :  If you need to reach your doctor call: Monday-Friday 8:00 am - 4:00 pm at [phone] or toll free [phone].  After clinic hours [phone] to have operator reach doctor.  Bring all of your medication bottles to all your appointments in the pain clinic.  To cancel or reschedule your appointment with Pain Management please remember to call 24 hours in advance to avoid a fee.  Refer to the educational materials which you have been given on: General Risks, I had my Procedure. Discharge Instructions, Post Sedation.  Post Procedure Instructions:  The drugs you were given will stay in your system until tomorrow, so for the next 24 hours you should not drive, make any legal decisions or drink any alcoholic beverages.  You may eat anything you prefer, but it is better to start with liquids then soups and crackers, and gradually work up to solid foods.  Please notify your doctor immediately if you have any unusual bleeding, trouble breathing or pain that is not related to your normal pain.  Depending on the type of procedure that was done, some parts of your body may feel week and/or numb.  This usually clears up by tonight or the next day.  Walk with the use of an assistive device or accompanied by an adult for the 24  hours.  You may use ice on the affected area for the first 24 hours.  Put ice in  a Ziploc bag and cover with a towel and place against area 15 minutes on 15 minutes off.  You may switch to heat after 24 hours. Knee Injection A knee injection is a procedure to get medicine into your knee joint. Your health care provider puts a needle into the joint and injects medicine with an attached syringe. The injected medicine may relieve the pain, swelling, and stiffness of arthritis. The injected medicine may also help to lubricate and cushion your knee joint. You may need more than one injection. Tell a health care provider about:  Any allergies you have.  All medicines you are taking, including vitamins, herbs, eye drops, creams, and over-the-counter medicines.  Any problems you or family members have had with anesthetic medicines.  Any blood disorders you have.  Any surgeries you have had.  Any medical conditions you have. What are the risks? Generally, this is a safe procedure. However, problems may occur, including:  Infection.  Bleeding.  Worsening symptoms.  Damage to the area around your knee.  Allergic reaction to any of the medicines.  Skin reactions from repeated injections. What happens before the procedure?  Ask your health care provider about changing or stopping your regular medicines. This is especially important if you are taking diabetes medicines or blood thinners.  Plan to have someone take you home after the procedure. What happens during the procedure?  You will sit or lie down in a position for your knee to be treated.  The skin over your kneecap will be cleaned with a germ-killing solution (antiseptic).  You will be given a medicine that numbs the area (local anesthetic). You may feel some stinging.  After your knee becomes numb, you will have a second injection. This is the medicine. This needle is carefully placed between your kneecap and your knee. The  medicine is injected into the joint space.  At the end of the procedure, the needle will be removed.  A bandage (dressing) may be placed over the injection site. The procedure may vary among health care providers and hospitals. What happens after the procedure?  You may have to move your knee through its full range of motion. This helps to get all of the medicine into your joint space.  Your blood pressure, heart rate, breathing rate, and blood oxygen level will be monitored often until the medicines you were given have worn off.  You will be watched to make sure that you do not have a reaction to the injected medicine. This information is not intended to replace advice given to you by your health care provider. Make sure you discuss any questions you have with your health care provider. Document Released: 11/23/2006 Document Revised: 02/01/2016 Document Reviewed: 07/12/2014 Elsevier Interactive Patient Education  2017 [employer].

## 2016-12-15 NOTE — Patient Instructions (Addendum)
Post-Procedure instructions Instructions:  Apply ice: Fill a plastic sandwich bag with crushed ice. Cover it with a small towel and apply to injection site. Apply for 15 minutes then remove x 15 minutes. Repeat sequence on day of procedure, until you go to bed. The purpose is to minimize swelling and discomfort after procedure.  Apply heat: Apply heat to procedure site starting the day following the procedure. The purpose is to treat any soreness and discomfort from the procedure.  Food intake: Start with clear liquids (like water) and advance to regular food, as tolerated.   Physical activities: Keep activities to a minimum for the first 8 hours after the procedure.   Driving: If you have received any sedation, you are not allowed to drive for 24 hours after your procedure.  Blood thinner: Restart your blood thinner 6 hours after your procedure. (Only for those taking blood thinners)  Insulin: As soon as you can eat, you may resume your normal dosing schedule. (Only for those taking insulin)  Infection prevention: Keep procedure site clean and dry.  Post-procedure Pain Diary: Extremely important that this be done correctly and accurately. Recorded information will be used to determine the next step in treatment.  Pain evaluated is that of treated area only. Do not include pain from an untreated area.  Complete every hour, on the hour, for the initial 8 hours. Set an alarm to help you do this part accurately.  Do not go to sleep and have it completed later. It will not be accurate.  Follow-up appointment: Keep your follow-up appointment after the procedure. Usually 2 weeks for most procedures. (6 weeks in the case of radiofrequency.) Bring you pain diary.  Expect:  From numbing medicine (AKA: Local Anesthetics): Numbness or decrease in pain.  Onset: Full effect within 15 minutes of injected.  Duration: It will depend on the type of local anesthetic used. On the average, 1 to 8  hours.   From steroids: Decrease in swelling or inflammation. Once inflammation is improved, relief of the pain will follow.  Onset of benefits: Depends on the amount of swelling present. The more swelling, the longer it will take for the benefits to be seen.   Duration: Steroids will stay in the system x 2 weeks. Duration of benefits will depend on multiple posibilities including persistent irritating factors.  From procedure: Some discomfort is to be expected once the numbing medicine wears off. This should be minimal if ice and heat are applied as instructed. Call if:  You experience numbness and weakness that gets worse with time, as opposed to wearing off.  New onset bowel or bladder incontinence. (Spinal procedures only)  Emergency Numbers:  Durning business hours (Monday - Thursday, 8:00 AM - 4:00 PM) (Friday, 9:00 AM - 12:00 Noon): [phone]  After hours: [phone]   __________________________________________________________________________________________   Pain Management Discharge Instructions  General Discharge Instructions :  If you need to reach your doctor call: Monday-Friday 8:00 am - 4:00 pm at [phone] or toll free [phone].  After clinic hours [phone] to have operator reach doctor.  Bring all of your medication bottles to all your appointments in the pain clinic.  To cancel or reschedule your appointment with Pain Management please remember to call 24 hours in advance to avoid a fee.  Refer to the educational materials which you have been given on: General Risks, I had my Procedure. Discharge Instructions, Post Sedation.  Post Procedure Instructions:  The drugs you were given will stay in   your system until tomorrow, so for the next 24 hours you should not drive, make any legal decisions or drink any alcoholic beverages.  You may eat anything you prefer, but it is better to start with liquids then soups and crackers, and gradually  work up to solid foods.  Please notify your doctor immediately if you have any unusual bleeding, trouble breathing or pain that is not related to your normal pain.  Depending on the type of procedure that was done, some parts of your body may feel week and/or numb.  This usually clears up by tonight or the next day.  Walk with the use of an assistive device or accompanied by an adult for the 24 hours.  You may use ice on the affected area for the first 24 hours.  Put ice in a Ziploc bag and cover with a towel and place against area 15 minutes on 15 minutes off.  You may switch to heat after 24 hours. Knee Injection A knee injection is a procedure to get medicine into your knee joint. Your health care provider puts a needle into the joint and injects medicine with an attached syringe. The injected medicine may relieve the pain, swelling, and stiffness of arthritis. The injected medicine may also help to lubricate and cushion your knee joint. You may need more than one injection. Tell a health care provider about:  Any allergies you have.  All medicines you are taking, including vitamins, herbs, eye drops, creams, and over-the-counter medicines.  Any problems you or family members have had with anesthetic medicines.  Any blood disorders you have.  Any surgeries you have had.  Any medical conditions you have. What are the risks? Generally, this is a safe procedure. However, problems may occur, including:  Infection.  Bleeding.  Worsening symptoms.  Damage to the area around your knee.  Allergic reaction to any of the medicines.  Skin reactions from repeated injections. What happens before the procedure?  Ask your health care provider about changing or stopping your regular medicines. This is especially important if you are taking diabetes medicines or blood thinners.  Plan to have someone take you home after the procedure. What happens during the procedure?  You will sit or  lie down in a position for your knee to be treated.  The skin over your kneecap will be cleaned with a germ-killing solution (antiseptic).  You will be given a medicine that numbs the area (local anesthetic). You may feel some stinging.  After your knee becomes numb, you will have a second injection. This is the medicine. This needle is carefully placed between your kneecap and your knee. The medicine is injected into the joint space.  At the end of the procedure, the needle will be removed.  A bandage (dressing) may be placed over the injection site. The procedure may vary among health care providers and hospitals. What happens after the procedure?  You may have to move your knee through its full range of motion. This helps to get all of the medicine into your joint space.  Your blood pressure, heart rate, breathing rate, and blood oxygen level will be monitored often until the medicines you were given have worn off.  You will be watched to make sure that you do not have a reaction to the injected medicine. This information is not intended to replace advice given to you by your health care provider. Make sure you discuss any questions you have with your health care provider.  Document Released: 11/23/2006 Document Revised: 02/01/2016 Document Reviewed: 07/12/2014 Elsevier Interactive Patient Education  2017 [employer].

## 2016-12-16 ENCOUNTER — Telehealth: Payer: Self-pay | Admitting: *Deleted

## 2016-12-16 NOTE — Telephone Encounter (Signed)
Denies complications post procedure. 

## 2016-12-25 ENCOUNTER — Other Ambulatory Visit: Payer: Self-pay | Admitting: Pain Medicine

## 2017-01-06 ENCOUNTER — Ambulatory Visit: Payer: Medicaid Other | Attending: Nurse Practitioner | Admitting: Pain Medicine

## 2017-01-06 ENCOUNTER — Encounter: Payer: Self-pay | Admitting: Pain Medicine

## 2017-01-06 VITALS — BP 145/91 | HR 76 | Temp 97.6°F | Resp 18 | Ht 68.0 in | Wt 164.0 lb

## 2017-01-06 DIAGNOSIS — I252 Old myocardial infarction: Secondary | ICD-10-CM | POA: Insufficient documentation

## 2017-01-06 DIAGNOSIS — I2510 Atherosclerotic heart disease of native coronary artery without angina pectoris: Secondary | ICD-10-CM | POA: Insufficient documentation

## 2017-01-06 DIAGNOSIS — I959 Hypotension, unspecified: Secondary | ICD-10-CM | POA: Insufficient documentation

## 2017-01-06 DIAGNOSIS — K449 Diaphragmatic hernia without obstruction or gangrene: Secondary | ICD-10-CM | POA: Diagnosis not present

## 2017-01-06 DIAGNOSIS — M25511 Pain in right shoulder: Secondary | ICD-10-CM | POA: Insufficient documentation

## 2017-01-06 DIAGNOSIS — F411 Generalized anxiety disorder: Secondary | ICD-10-CM | POA: Insufficient documentation

## 2017-01-06 DIAGNOSIS — K219 Gastro-esophageal reflux disease without esophagitis: Secondary | ICD-10-CM | POA: Diagnosis not present

## 2017-01-06 DIAGNOSIS — Z62819 Personal history of unspecified abuse in childhood: Secondary | ICD-10-CM | POA: Insufficient documentation

## 2017-01-06 DIAGNOSIS — M5410 Radiculopathy, site unspecified: Secondary | ICD-10-CM | POA: Diagnosis not present

## 2017-01-06 DIAGNOSIS — G8929 Other chronic pain: Secondary | ICD-10-CM | POA: Diagnosis not present

## 2017-01-06 DIAGNOSIS — G35 Multiple sclerosis: Secondary | ICD-10-CM | POA: Diagnosis not present

## 2017-01-06 DIAGNOSIS — Z87442 Personal history of urinary calculi: Secondary | ICD-10-CM | POA: Diagnosis not present

## 2017-01-06 DIAGNOSIS — M542 Cervicalgia: Secondary | ICD-10-CM | POA: Insufficient documentation

## 2017-01-06 DIAGNOSIS — M79673 Pain in unspecified foot: Secondary | ICD-10-CM | POA: Diagnosis not present

## 2017-01-06 DIAGNOSIS — K589 Irritable bowel syndrome without diarrhea: Secondary | ICD-10-CM | POA: Insufficient documentation

## 2017-01-06 DIAGNOSIS — M79643 Pain in unspecified hand: Secondary | ICD-10-CM

## 2017-01-06 DIAGNOSIS — M79605 Pain in left leg: Secondary | ICD-10-CM | POA: Diagnosis not present

## 2017-01-06 DIAGNOSIS — M545 Low back pain: Secondary | ICD-10-CM | POA: Diagnosis present

## 2017-01-06 DIAGNOSIS — M25561 Pain in right knee: Secondary | ICD-10-CM | POA: Insufficient documentation

## 2017-01-06 DIAGNOSIS — Z7901 Long term (current) use of anticoagulants: Secondary | ICD-10-CM | POA: Diagnosis not present

## 2017-01-06 DIAGNOSIS — D869 Sarcoidosis, unspecified: Secondary | ICD-10-CM | POA: Diagnosis not present

## 2017-01-06 DIAGNOSIS — I509 Heart failure, unspecified: Secondary | ICD-10-CM | POA: Insufficient documentation

## 2017-01-06 DIAGNOSIS — M419 Scoliosis, unspecified: Secondary | ICD-10-CM | POA: Diagnosis not present

## 2017-01-06 DIAGNOSIS — Z79891 Long term (current) use of opiate analgesic: Secondary | ICD-10-CM | POA: Diagnosis not present

## 2017-01-06 DIAGNOSIS — E785 Hyperlipidemia, unspecified: Secondary | ICD-10-CM | POA: Diagnosis not present

## 2017-01-06 DIAGNOSIS — Z96653 Presence of artificial knee joint, bilateral: Secondary | ICD-10-CM | POA: Insufficient documentation

## 2017-01-06 DIAGNOSIS — M797 Fibromyalgia: Secondary | ICD-10-CM | POA: Diagnosis not present

## 2017-01-06 DIAGNOSIS — M546 Pain in thoracic spine: Secondary | ICD-10-CM | POA: Insufficient documentation

## 2017-01-06 DIAGNOSIS — Z8673 Personal history of transient ischemic attack (TIA), and cerebral infarction without residual deficits: Secondary | ICD-10-CM | POA: Insufficient documentation

## 2017-01-06 DIAGNOSIS — M79641 Pain in right hand: Secondary | ICD-10-CM | POA: Insufficient documentation

## 2017-01-06 DIAGNOSIS — M79604 Pain in right leg: Secondary | ICD-10-CM | POA: Diagnosis not present

## 2017-01-06 DIAGNOSIS — M792 Neuralgia and neuritis, unspecified: Secondary | ICD-10-CM

## 2017-01-06 DIAGNOSIS — G894 Chronic pain syndrome: Secondary | ICD-10-CM | POA: Diagnosis not present

## 2017-01-06 DIAGNOSIS — M25562 Pain in left knee: Secondary | ICD-10-CM | POA: Insufficient documentation

## 2017-01-06 DIAGNOSIS — F319 Bipolar disorder, unspecified: Secondary | ICD-10-CM | POA: Insufficient documentation

## 2017-01-06 DIAGNOSIS — F17200 Nicotine dependence, unspecified, uncomplicated: Secondary | ICD-10-CM | POA: Insufficient documentation

## 2017-01-06 DIAGNOSIS — F1190 Opioid use, unspecified, uncomplicated: Secondary | ICD-10-CM

## 2017-01-06 DIAGNOSIS — M25512 Pain in left shoulder: Secondary | ICD-10-CM | POA: Insufficient documentation

## 2017-01-06 DIAGNOSIS — E039 Hypothyroidism, unspecified: Secondary | ICD-10-CM | POA: Insufficient documentation

## 2017-01-06 MED ORDER — PREGABALIN 50 MG PO CAPS: 50.0000 mg | ORAL_CAPSULE | Freq: Two times a day (BID) | ORAL | 0 refills | Status: DC

## 2017-01-06 MED ORDER — OXYCODONE HCL 5 MG PO TABS: 5.0000 mg | ORAL_TABLET | Freq: Four times a day (QID) | ORAL | 0 refills | Status: DC | PRN

## 2017-01-06 NOTE — Progress Notes (Signed)
Nursing Pain Medication Assessment:  Safety precautions to be maintained throughout the outpatient stay will include: orient to surroundings, keep bed in low position, maintain call bell within reach at all times, provide assistance with transfer out of bed and ambulation.  Medication Inspection Compliance: Pill count conducted under aseptic conditions, in front of the patient. Neither the pills nor the bottle was removed from the patient's sight at any time. Once count was completed pills were immediately returned to the patient in their original bottle.  Medication: Oxycodone IR Pill/Patch Count: 4 of 120 pills remain Pill/Patch Appearance: Markings consistent with prescribed medication Bottle Appearance: Standard pharmacy container. Clearly labeled. Filled Date: 03/27/ 2018 Last Medication intake:  Today

## 2017-01-06 NOTE — Patient Instructions (Addendum)
Pain Score  Introduction: The pain score used by this practice is the Verbal Numerical Rating Scale (VNRS-11). This is an 11-point scale. It is for adults and children 10 years or older. There are significant differences in how the pain score is reported, used, and applied. Forget everything you learned in the past and learn this scoring system.  General Information: The scale should reflect your current level of pain. Unless you are specifically asked for the level of your worst pain, or your average pain. If you are asked for one of these two, then it should be understood that it is over the past 24 hours.  Basic Activities of Daily Living (ADL): Personal hygiene, dressing, eating, transferring, and using restroom.  Instructions: Most patients tend to report their level of pain as a combination of two factors, their physical pain and their psychosocial pain. This last one is also known as "suffering" and it is reflection of how physical pain affects you socially and psychologically. From now on, report them separately. From this point on, when asked to report your pain level, report only your physical pain. Use the following table for reference.  Pain Clinic Pain Levels (0-5/10)  Pain Level Score Description  No Pain 0   Mild pain 1 Nagging, annoying, but does not interfere with basic activities of daily living (ADL). Patients are able to eat, bathe, get dressed, toileting (being able to get on and off the toilet and perform personal hygiene functions), transfer (move in and out of bed or a chair without assistance), and maintain continence (able to control bladder and bowel functions). Blood pressure and heart rate are unaffected. A normal heart rate for a healthy adult ranges from 60 to 100 bpm (beats per minute).   Mild to moderate pain 2 Noticeable and distracting. Impossible to hide from other people. More frequent flare-ups. Still possible to adapt and function close to normal. It can be very  annoying and may have occasional stronger flare-ups. With discipline, patients may get used to it and adapt.   Moderate pain 3 Interferes significantly with activities of daily living (ADL). It becomes difficult to feed, bathe, get dressed, get on and off the toilet or to perform personal hygiene functions. Difficult to get in and out of bed or a chair without assistance. Very distracting. With effort, it can be ignored when deeply involved in activities.   Moderately severe pain 4 Impossible to ignore for more than a few minutes. With effort, patients may still be able to manage work or participate in some social activities. Very difficult to concentrate. Signs of autonomic nervous system discharge are evident: dilated pupils (mydriasis); mild sweating (diaphoresis); sleep interference. Heart rate becomes elevated (>115 bpm). Diastolic blood pressure (lower number) rises above 100 mmHg. Patients find relief in laying down and not moving.   Severe pain 5 Intense and extremely unpleasant. Associated with frowning face and frequent crying. Pain overwhelms the senses.  Ability to do any activity or maintain social relationships becomes significantly limited. Conversation becomes difficult. Pacing back and forth is common, as getting into a comfortable position is nearly impossible. Pain wakes you up from deep sleep. Physical signs will be obvious: pupillary dilation; increased sweating; goosebumps; brisk reflexes; cold, clammy hands and feet; nausea, vomiting or dry heaves; loss of appetite; significant sleep disturbance with inability to fall asleep or to remain asleep. When persistent, significant weight loss is observed due to the complete loss of appetite and sleep deprivation.  Blood pressure and heart   rate becomes significantly elevated. Caution: If elevated blood pressure triggers a pounding headache associated with blurred vision, then the patient should immediately seek attention at an urgent or  emergency care unit, as these may be signs of an impending stroke.    Emergency Department Pain Levels (6-10/10)  Emergency Room Pain 6 Severely limiting. Requires emergency care and should not be seen or managed at an outpatient pain management facility. Communication becomes difficult and requires great effort. Assistance to reach the emergency department may be required. Facial flushing and profuse sweating along with potentially dangerous increases in heart rate and blood pressure will be evident.   Distressing pain 7 Self-care is very difficult. Assistance is required to transport, or use restroom. Assistance to reach the emergency department will be required. Tasks requiring coordination, such as bathing and getting dressed become very difficult.   Disabling pain 8 Self-care is no longer possible. At this level, pain is disabling. The individual is unable to do even the most "basic" activities such as walking, eating, bathing, dressing, transferring to a bed, or toileting. Fine motor skills are lost. It is difficult to think clearly.   Incapacitating pain 9 Pain becomes incapacitating. Thought processing is no longer possible. Difficult to remember your own name. Control of movement and coordination are lost.   The worst pain imaginable 10 At this level, most patients pass out from pain. When this level is reached, collapse of the autonomic nervous system occurs, leading to a sudden drop in blood pressure and heart rate. This in turn results in a temporary and dramatic drop in blood flow to the brain, leading to a loss of consciousness. Fainting is one of the body's self defense mechanisms. Passing out puts the brain in a calmed state and causes it to shut down for a while, in order to begin the healing process.    Summary: 1. Refer to this scale when providing us with your pain level. 2. Be accurate and careful when reporting your pain level. This will help with your care. 3. Over-reporting  your pain level will lead to loss of credibility. 4. Even a level of 1/10 means that there is pain and will be treated at our facility. 5. High, inaccurate reporting will be documented as "Symptom Exaggeration", leading to loss of credibility and suspicions of possible secondary gains such as obtaining more narcotics, or wanting to appear disabled, for fraudulent reasons. 6. Only pain levels of 5 or below will be seen at our facility. 7. Pain levels of 6 and above will be sent to the Emergency Department and the appointment cancelled. _____________________________________________________________________________________________  Pain Management Discharge Instructions  General Discharge Instructions :  If you need to reach your doctor call: Monday-Friday 8:00 am - 4:00 pm at [phone] or toll free [phone].  After clinic hours [phone] to have operator reach doctor.  Bring all of your medication bottles to all your appointments in the pain clinic.  To cancel or reschedule your appointment with Pain Management please remember to call 24 hours in advance to avoid a fee.  Refer to the educational materials which you have been given on: General Risks, I had my Procedure. Discharge Instructions, Post Sedation.  Post Procedure Instructions:  The drugs you were given will stay in your system until tomorrow, so for the next 24 hours you should not drive, make any legal decisions or drink any alcoholic beverages.  You may eat anything you prefer, but it is better to start with liquids then soups and   crackers, and gradually work up to solid foods.  Please notify your doctor immediately if you have any unusual bleeding, trouble breathing or pain that is not related to your normal pain.  Depending on the type of procedure that was done, some parts of your body may feel week and/or numb.  This usually clears up by tonight or the next day.  Walk with the use of an assistive device or  accompanied by an adult for the 24 hours.  You may use ice on the affected area for the first 24 hours.  Put ice in a Ziploc bag and cover with a towel and place against area 15 minutes on 15 minutes off.  You may switch to heat after 24 hours. 

## 2017-01-06 NOTE — Progress Notes (Signed)
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 for adults and children 10 years or older. There are significant differences in how the pain score is reported, used, and applied. Forget everything you learned in the past and learn this scoring system.  General Information: The scale should reflect your current level of pain. Unless you are specifically asked for the level of your worst pain, or your average pain. If you are asked for one of these two, then it should be understood that it is over the past 24 hours.  Basic Activities of Daily Living (ADL): Personal hygiene, dressing, eating, transferring, and using restroom.  Instructions: Most patients tend to report their level of pain as a combination of two factors, their physical pain and their psychosocial pain. This last one is also known as "suffering" and  it is reflection of how physical pain affects you socially and psychologically. From now on, report them separately. From this point on, when asked to report your pain level, report only your physical  pain. Use the following table for reference.  Pain Clinic Pain Levels (0-5/10)  Pain Level Score Description  No Pain 0   Mild pain 1 Nagging, annoying, but does not interfere with basic activities of daily living (ADL). Patients are able to eat, bathe, get dressed, toileting (being able to get on and off the toilet and perform personal hygiene functions), transfer (move in and out of bed or a chair without assistance), and maintain continence (able to control bladder and bowel functions). Blood pressure and heart rate are unaffected. A normal heart rate for a healthy adult ranges from 60 to 100 bpm (beats per minute).   Mild to moderate pain 2 Noticeable and distracting. Impossible to hide from other people. More frequent flare-ups. Still possible to adapt and function close to normal. It can be very annoying and may have occasional stronger flare-ups. With discipline, patients may get used to it and adapt.   Moderate pain 3 Interferes significantly with activities of daily living (ADL). It becomes difficult to feed, bathe, get dressed, get on and off the toilet or to perform personal hygiene functions. Difficult to get in and out of bed or a chair without assistance. Very distracting. With effort, it can be ignored when deeply involved in activities.   Moderately severe pain 4 Impossible to ignore for more than a few minutes. With effort, patients may still be able to manage work or participate in some social activities. Very difficult to concentrate. Signs of autonomic nervous system discharge are evident: dilated pupils (mydriasis); mild sweating (diaphoresis); sleep interference. Heart rate becomes elevated (>115 bpm). Diastolic blood pressure (lower number) rises above 100 mmHg. Patients find relief in laying down and not moving.   Severe pain 5 Intense and extremely unpleasant. Associated with frowning face and frequent crying. Pain overwhelms the senses.  Ability to do any activity or maintain  social relationships becomes significantly limited. Conversation becomes difficult. Pacing back and forth is common, as getting into a comfortable position is nearly impossible. Pain wakes you up from deep sleep. Physical signs will be obvious: pupillary dilation; increased sweating; goosebumps; brisk reflexes; cold, clammy hands and feet; nausea, vomiting or dry heaves; loss of appetite; significant sleep disturbance with inability to fall asleep or to remain asleep. When persistent, significant weight loss is observed due to the complete loss of appetite and sleep deprivation.  Blood pressure and heart rate becomes significantly elevated. Caution: If elevated blood pressure triggers a pounding headache associated with blurred vision, then the patient should immediately seek attention at an urgent or emergency care unit, as these may be signs of an impending stroke.    Emergency Department Pain Levels (6-10/10)  Emergency Room Pain 6 Severely limiting. Requires emergency care and should not be seen or managed at an outpatient pain management facility. Communication becomes difficult and requires great effort. Assistance to reach the emergency department may be required. Facial flushing and profuse sweating along with potentially dangerous increases in heart rate and blood pressure will be evident.   Distressing pain 7 Self-care is very difficult. Assistance is required to transport, or use restroom. Assistance to reach the emergency department will be required. Tasks requiring coordination, such as bathing and getting dressed become very difficult.   Disabling pain 8 Self-care is no longer possible. At this  level, pain is disabling. The individual is unable to do even the most "basic" activities such as walking, eating, bathing, dressing, transferring to a bed, or toileting. Fine motor skills are lost. It is difficult to think clearly.   Incapacitating pain 9 Pain becomes incapacitating. Thought  processing is no longer possible. Difficult to remember your own name. Control of movement and coordination are lost.   The worst pain imaginable 10 At this level, most patients pass out from pain. When this level is reached, collapse of the autonomic nervous system occurs, leading to a sudden drop in blood pressure and heart rate. This in turn results in a temporary and dramatic drop in blood flow to the brain, leading to a loss of consciousness. Fainting is one of the body's self defense mechanisms. Passing out puts the brain in a calmed state and causes it to shut down for a while, in order to begin the healing process.    Summary: 1. Refer to this scale when providing [place] with your pain level. 2. Be accurate and careful when reporting your pain level. This will help with your care. 3. Over-reporting your pain level will lead to loss of credibility. 4. Even a level of 1/10 means that there is pain and will be treated at our facility. 5. High, inaccurate reporting will be documented as "Symptom Exaggeration", leading to loss of credibility and suspicions of possible secondary gains such as obtaining more narcotics, or wanting to appear disabled, for fraudulent reasons. 6. Only pain levels of 5 or below will be seen at our facility. 7. Pain levels of 6 and above will be sent to the Emergency Department and the appointment cancelled. _____________________________________________________________________________________________  Pain Management Discharge Instructions  General Discharge Instructions :  If you need to reach your doctor call: Monday-Friday 8:00 am - 4:00 pm at [phone] or toll free [phone].  After clinic hours [phone] to have operator reach doctor.  Bring all of your medication bottles to all your appointments in the pain clinic.  To cancel or reschedule your appointment with Pain Management please remember to call 24 hours in advance to avoid a fee.  Refer to the  educational materials which you have been given on: General Risks, I had my Procedure. Discharge Instructions, Post Sedation.  Post Procedure Instructions:  The drugs you were given will stay in your system until tomorrow, so for the next 24 hours you should not drive, make any legal decisions or drink any alcoholic beverages.  You may eat anything you prefer, but it is better to start with liquids then soups and crackers, and gradually work up to solid foods.  Please notify your doctor immediately if you have any unusual bleeding, trouble breathing or pain that is not related to your normal pain.  Depending on the type of procedure that was done, some parts of your body may feel week and/or numb.  This usually clears up by tonight or the next day.  Walk with the use of an assistive device or accompanied by an adult for the 24 hours.  You may use ice on the affected area for the first 24 hours.  Put ice in a Ziploc bag and cover with a towel and place against area 15 minutes on 15 minutes off.  You may switch to heat after 24 hours.

## 2017-01-12 ENCOUNTER — Telehealth: Payer: Self-pay
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
## 2017-01-13 ENCOUNTER — Telehealth: Payer: Self-pay

## 2017-01-19 ENCOUNTER — Telehealth: Payer: Self-pay | Admitting: Pain Medicine

## 2017-01-19 NOTE — Telephone Encounter (Signed)
Dr. Letta Moynahan called stating he had gotten a fax on this patient requesting medical records that was supposed to go to [facility], he destroyed the fax, just wanted to let us know so we could direct the fax to [facility]

## 2017-01-20 ENCOUNTER — Telehealth: Payer: Self-pay
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
## 2017-01-26 NOTE — Telephone Encounter (Signed)
Thanks

## 2017-02-03 ENCOUNTER — Other Ambulatory Visit: Payer: Self-pay | Admitting: Pain Medicine

## 2017-02-03 DIAGNOSIS — M797 Fibromyalgia: Secondary | ICD-10-CM

## 2017-02-03 DIAGNOSIS — M792 Neuralgia and neuritis, unspecified: Secondary | ICD-10-CM

## 2017-02-05 ENCOUNTER — Ambulatory Visit (HOSPITAL_BASED_OUTPATIENT_CLINIC_OR_DEPARTMENT_OTHER): Payer: Medicaid Other | Admitting: Pain Medicine

## 2017-02-05 ENCOUNTER — Encounter: Payer: Self-pay | Admitting: Pain Medicine

## 2017-02-05 ENCOUNTER — Ambulatory Visit
Admission: RE | Admit: 2017-02-05 | Discharge: 2017-02-05 | Disposition: A | Discharge status: Home / Self Care | Payer: Medicaid Other | Source: Ambulatory Visit | Attending: Pain Medicine | Admitting: Pain Medicine

## 2017-02-05 VITALS — BP 126/87 | HR 73 | Temp 98.3°F | Ht 68.0 in | Wt 163.0 lb

## 2017-02-05 DIAGNOSIS — I252 Old myocardial infarction: Secondary | ICD-10-CM | POA: Diagnosis not present

## 2017-02-05 DIAGNOSIS — R208 Other disturbances of skin sensation: Secondary | ICD-10-CM

## 2017-02-05 DIAGNOSIS — M5410 Radiculopathy, site unspecified: Secondary | ICD-10-CM | POA: Insufficient documentation

## 2017-02-05 DIAGNOSIS — K449 Diaphragmatic hernia without obstruction or gangrene: Secondary | ICD-10-CM | POA: Insufficient documentation

## 2017-02-05 DIAGNOSIS — G35 Multiple sclerosis: Secondary | ICD-10-CM | POA: Insufficient documentation

## 2017-02-05 DIAGNOSIS — F411 Generalized anxiety disorder: Secondary | ICD-10-CM | POA: Insufficient documentation

## 2017-02-05 DIAGNOSIS — Z96653 Presence of artificial knee joint, bilateral: Secondary | ICD-10-CM | POA: Insufficient documentation

## 2017-02-05 DIAGNOSIS — F4310 Post-traumatic stress disorder, unspecified: Secondary | ICD-10-CM | POA: Insufficient documentation

## 2017-02-05 DIAGNOSIS — M542 Cervicalgia: Secondary | ICD-10-CM | POA: Insufficient documentation

## 2017-02-05 DIAGNOSIS — G894 Chronic pain syndrome: Secondary | ICD-10-CM

## 2017-02-05 DIAGNOSIS — N400 Enlarged prostate without lower urinary tract symptoms: Secondary | ICD-10-CM | POA: Insufficient documentation

## 2017-02-05 DIAGNOSIS — M25512 Pain in left shoulder: Secondary | ICD-10-CM | POA: Diagnosis not present

## 2017-02-05 DIAGNOSIS — Z7901 Long term (current) use of anticoagulants: Secondary | ICD-10-CM | POA: Diagnosis not present

## 2017-02-05 DIAGNOSIS — M797 Fibromyalgia: Secondary | ICD-10-CM | POA: Insufficient documentation

## 2017-02-05 DIAGNOSIS — M79643 Pain in unspecified hand: Secondary | ICD-10-CM | POA: Diagnosis not present

## 2017-02-05 DIAGNOSIS — E785 Hyperlipidemia, unspecified: Secondary | ICD-10-CM | POA: Insufficient documentation

## 2017-02-05 DIAGNOSIS — F1190 Opioid use, unspecified, uncomplicated: Secondary | ICD-10-CM | POA: Diagnosis not present

## 2017-02-05 DIAGNOSIS — T402X5A Adverse effect of other opioids, initial encounter: Secondary | ICD-10-CM

## 2017-02-05 DIAGNOSIS — M25511 Pain in right shoulder: Secondary | ICD-10-CM | POA: Insufficient documentation

## 2017-02-05 DIAGNOSIS — Z79891 Long term (current) use of opiate analgesic: Secondary | ICD-10-CM | POA: Diagnosis not present

## 2017-02-05 DIAGNOSIS — M79604 Pain in right leg: Secondary | ICD-10-CM | POA: Diagnosis not present

## 2017-02-05 DIAGNOSIS — Z79899 Other long term (current) drug therapy: Secondary | ICD-10-CM | POA: Insufficient documentation

## 2017-02-05 DIAGNOSIS — M419 Scoliosis, unspecified: Secondary | ICD-10-CM | POA: Diagnosis not present

## 2017-02-05 DIAGNOSIS — M545 Low back pain: Secondary | ICD-10-CM | POA: Diagnosis not present

## 2017-02-05 DIAGNOSIS — Z5181 Encounter for therapeutic drug level monitoring: Secondary | ICD-10-CM | POA: Diagnosis not present

## 2017-02-05 DIAGNOSIS — K589 Irritable bowel syndrome without diarrhea: Secondary | ICD-10-CM | POA: Insufficient documentation

## 2017-02-05 DIAGNOSIS — I509 Heart failure, unspecified: Secondary | ICD-10-CM | POA: Diagnosis not present

## 2017-02-05 DIAGNOSIS — F319 Bipolar disorder, unspecified: Secondary | ICD-10-CM | POA: Insufficient documentation

## 2017-02-05 DIAGNOSIS — M79641 Pain in right hand: Secondary | ICD-10-CM | POA: Diagnosis not present

## 2017-02-05 DIAGNOSIS — J441 Chronic obstructive pulmonary disease with (acute) exacerbation: Secondary | ICD-10-CM | POA: Insufficient documentation

## 2017-02-05 DIAGNOSIS — M79605 Pain in left leg: Secondary | ICD-10-CM | POA: Diagnosis not present

## 2017-02-05 DIAGNOSIS — G8929 Other chronic pain: Secondary | ICD-10-CM | POA: Diagnosis not present

## 2017-02-05 DIAGNOSIS — I2510 Atherosclerotic heart disease of native coronary artery without angina pectoris: Secondary | ICD-10-CM | POA: Insufficient documentation

## 2017-02-05 DIAGNOSIS — I959 Hypotension, unspecified: Secondary | ICD-10-CM | POA: Insufficient documentation

## 2017-02-05 DIAGNOSIS — D869 Sarcoidosis, unspecified: Secondary | ICD-10-CM | POA: Diagnosis not present

## 2017-02-05 DIAGNOSIS — Z9889 Other specified postprocedural states: Secondary | ICD-10-CM | POA: Insufficient documentation

## 2017-02-05 DIAGNOSIS — Z8673 Personal history of transient ischemic attack (TIA), and cerebral infarction without residual deficits: Secondary | ICD-10-CM | POA: Insufficient documentation

## 2017-02-05 DIAGNOSIS — F17210 Nicotine dependence, cigarettes, uncomplicated: Secondary | ICD-10-CM | POA: Insufficient documentation

## 2017-02-05 DIAGNOSIS — Z87442 Personal history of urinary calculi: Secondary | ICD-10-CM | POA: Insufficient documentation

## 2017-02-05 DIAGNOSIS — E039 Hypothyroidism, unspecified: Secondary | ICD-10-CM | POA: Insufficient documentation

## 2017-02-05 DIAGNOSIS — K219 Gastro-esophageal reflux disease without esophagitis: Secondary | ICD-10-CM | POA: Insufficient documentation

## 2017-02-05 MED ORDER — PREGABALIN 75 MG PO CAPS: 75.0000 mg | ORAL_CAPSULE | Freq: Three times a day (TID) | ORAL | 0 refills | Status: DC

## 2017-02-05 MED ORDER — OXYCODONE HCL 5 MG PO TABS: 5.0000 mg | ORAL_TABLET | Freq: Three times a day (TID) | ORAL | 0 refills | Status: DC

## 2017-02-05 MED ORDER — ORPHENADRINE CITRATE 30 MG/ML IJ SOLN
60.0000 mg | Freq: Once | INTRAMUSCULAR | Status: AC
  Administered 2017-02-05: 60 mg via INTRAMUSCULAR

## 2017-02-05 MED ORDER — OXYCODONE HCL 5 MG PO TABS: 5.0000 mg | ORAL_TABLET | Freq: Two times a day (BID) | ORAL | 0 refills | Status: DC

## 2017-02-05 MED ORDER — OXYCODONE HCL 5 MG PO TABS: 5.0000 mg | ORAL_TABLET | Freq: Every day | ORAL | 0 refills | Status: DC

## 2017-02-05 MED ORDER — ORPHENADRINE CITRATE 30 MG/ML IJ SOLN
INTRAMUSCULAR | Status: AC
  Filled 2017-02-05: qty 2

## 2017-02-05 MED ORDER — KETOROLAC TROMETHAMINE 60 MG/2ML IM SOLN
60.0000 mg | Freq: Once | INTRAMUSCULAR | Status: AC
  Administered 2017-02-05: 60 mg via INTRAMUSCULAR

## 2017-02-05 MED ORDER — KETOROLAC TROMETHAMINE 60 MG/2ML IM SOLN
INTRAMUSCULAR | Status: AC
  Filled 2017-02-05: qty 2

## 2017-02-05 MED ORDER — OXYCODONE HCL 5 MG PO TABS: 5.0000 mg | ORAL_TABLET | Freq: Four times a day (QID) | ORAL | 0 refills | Status: DC

## 2017-02-05 NOTE — Progress Notes (Signed)
Nursing Pain Medication Assessment:  Safety precautions to be maintained throughout the outpatient stay will include: orient to surroundings, keep bed in low position, maintain call bell within reach at all times, provide assistance with transfer out of bed and ambulation.  Medication Inspection Compliance: Pill count conducted under aseptic conditions, in front of the patient. Neither the pills nor the bottle was removed from the patient's sight at any time. Once count was completed pills were immediately returned to the patient in their original bottle.  Medication: Oxycodone IR Pill/Patch Count: 2 of 120 pills remain Pill/Patch Appearance: Markings consistent with prescribed medication Bottle Appearance: Standard pharmacy container. Clearly labeled. Filled Date: 04 / 24 / 2018 Last Medication intake:  Today

## 2017-02-05 NOTE — Progress Notes (Signed)
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 Max: 1/day   PREGABALIN (LYRICA) 75 MG CAPSULE    Take 1 capsule (75 mg total) by mouth 3 (three) times daily.   Medications administered today: We administered orphenadrine and ketorolac. Lab-work, procedure(s), and/or referral(s): Orders Placed This Encounter  Procedures  . ToxASSURE Select 13 (MW), Urine   Imaging and/or referral(s): None  Interventional therapies: Planned, scheduled, and/or pending:   Not at this time.   Considering:   Stop Coumadin 5 daysprior to procedures Diagnostic bilateral lumbar facet block Possible bilateral lumbar facet radiofrequency ablation Diagnostic bilateral sacroiliac joint block Possible bilateral sacroiliac joint radiofrequency ablation Diagnostic cervical epidural steroid injection   Palliative PRN treatment(s):   To be determined at a later time.   Provider-requested follow-up: Return in about 1 month (around 03/08/2017) for Med-Mgmt, w/ MD.  Future Appointments Date Time Provider Department Center  03/05/2017 1:30 PM Delano Metz, MD [facility] None   Primary Care  Physician: Dortha Kern, MD Location: [facility] Note by: Sydnee Levans. Laban Emperor, M.D, DABA, DABAPM, DABPM, DABIPP, FIPP Date: 02/05/2017; Time: 4:27 PM  Patient instructions provided during this appointment: Patient Instructions  ____________________________________________________________________________________________  Appointment Policy  It is our goal and responsibility to provide the medical community with assistance in the evaluation and management of patients with chronic pain. Unfortunately our resources are limited. Because we do not have an unlimited amount of time, or available appointments, we are required to closely monitor and manage their use. The following rules exist to maximize their use:  Patient's responsibilities: 1. Punctuality: You are required to be physically present and registered in our facility at least 30 minutes before your appointment. 2. Tardiness: The cutoff is your appointment time. If you have an appointment scheduled for 10:00 AM and you arrive at 10:01, you will be required to reschedule your appointment.  3. Plan ahead: Always assume that you will encounter traffic on your way in. Plan for it. If you are dependent on a driver, make sure they understand these rules and the need to arrive early. 4. Other appointments and responsibilities: Avoid scheduling any other appointments before or after your pain clinic appointments.  5. Be prepared: Write down everything that you need to discuss with your healthcare provider and give this information to the admitting nurse. Write down the medications that you will need refilled. Bring your pills and bottles (even the empty ones), to all of your appointments, except for those where a procedure is scheduled. 6. No children or pets: Find someone to take care of them. It is not appropriate to bring them in. 7. Scheduling changes: We request "advanced notification" of any changes or  cancellations. 8. Advanced notification: Defined as a time period of more than 24 hours prior to the originally scheduled appointment. This allows for the appointment to be offered to other patients. 9. Rescheduling: When a visit is rescheduled, it will require the cancellation of the original appointment. For this reason they both fall within the category of "Cancellations".  10. Cancellations: They require advanced notification. Any cancellation less than 24 hours before the  appointment will be recorded as a "No Show". 11. No Show: Defined as an unkept appointment where the patient failed to notify or declare to the practice their intention or inability to keep the appointment.  Corrective process for repeat offenders:  1. Tardiness: Three (3) episodes of rescheduling due to late arrivals will be recorded as one (1) "No Show". 2. Cancellation or reschedule: Three (3)  cancellations or rescheduling will be recorded as one (1) "No Show". 3. "No Shows": Three (3) "No Shows" within a 12 month period will result in discharge from the practice.  ____________________________________________________________________________________________  ____________________________________________________________________________________________  Medication Rules  Applies to: All patients receiving prescriptions (written or electronic).  Pharmacy of record: Pharmacy where electronic prescriptions will be sent. If written prescriptions are taken to a different pharmacy, please inform the nursing staff. The pharmacy listed in the electronic medical record should be the one where you would like electronic prescriptions to be sent.  Prescription refills: Only during scheduled appointments. Applies to both, written and electronic prescriptions.  NOTE: The following applies primarily to controlled substances (Opioid Pain Medications)  Patient's responsibilities: 12. Pain Pills: Bring all pain pills to every  appointment (except for procedure appointments). 13. Pill Bottles: Bring pills in original pharmacy bottle. Always bring newest bottle. Bring bottle, even if empty. 14. Medication refills: You are responsible for knowing and keeping track of what medications you need refilled. The day before your appointment, write a list of all prescriptions that need to be refilled. Bring that list to your appointment and give it to the admitting nurse. Prescriptions will be written only during appointments. If you forget a medication, it will not be "Called in", "Faxed", or "electronically sent". You will need to get another appointment to get these prescribed. 15. Prescription Accuracy: You are responsible for carefully inspecting your prescriptions before leaving our office. Have the discharge nurse carefully go over each prescription with you, before taking them home. Make sure that your name is accurately spelled, that your address is correct. Check the name and dose of your medication to make sure it is accurate. Check the number of pills, and the written instructions to make sure they are clear and accurate. Make sure that you are given enough medication to last until your next medication refill appointment. 16. Taking Medication: Take medication as prescribed. Never take more pills than instructed. Never take medication more frequently than prescribed. Taking less pills or less frequently is permitted and encouraged, when it comes to controlled substances (written prescriptions).  17. Inform other Doctors: Always inform, all of your healthcare providers, of all the medications you take. 18. Pain Medication from other Providers: You are not allowed to accept any additional pain medication from any other Doctor or Healthcare provider. There are two exceptions to this rule. (see below) In the event that you require additional pain medication, you are responsible for notifying us, as stated below. 19. Medication  Agreement: You are responsible for carefully reading and following our Medication Agreement. This must be signed before receiving any prescriptions from our practice. Safely store a copy of your signed Agreement. Violations to the Agreement will result in no further prescriptions. (Additional copies of our Medication Agreement are available upon request.) 20. Laws, Rules, & Regulations: All patients are expected to follow all [address] and [employer], Rules, [place] [employer]. Ignorance of the Laws does not constitute a valid excuse.  Exceptions: There are only two exceptions to the rule of not receiving pain medications from other Healthcare Providers. 4. Exception #1 (Emergencies): In the event of an emergency (i.e.: accident requiring emergency care), you are allowed to receive additional pain medication. However, you are responsible for: As soon as you are able, call our office [phone], at any time of the day or night, and leave a message stating your name, the date and nature of the emergency, and the name and dose of  the medication prescribed. In the event that your call is answered by a member of our staff, make sure to document and save the date, time, and the name of the person that took your information.  5. Exception #2 (Planned Surgery): In the event that you are scheduled by another doctor or dentist to have any type of surgery or procedure, you are allowed (for a period no longer than 30 days), to receive additional pain medication, for the acute post-op pain. However, in this case, you are responsible for picking up a copy of our "Post-op Pain Management for Surgeons" handout, and giving it to your surgeon or dentist. This document is available at our office, and does not require an appointment to obtain it. Simply go to our office during business hours (Monday-Thursday from 8:00 AM to 4:00 PM) (Friday 8:00 AM to 12:00 Noon) or if you have a scheduled appointment with [place], prior to your  surgery, and ask for it by name. In addition, you will need to provide [place] with your name, name of your surgeon, type of surgery, and date of procedure or surgery.  _____________________________________________________________________________________________

## 2017-02-05 NOTE — Patient Instructions (Signed)
____________________________________________________________________________________________  Appointment Policy  It is our goal and responsibility to provide the medical community with assistance in the evaluation and management of patients with chronic pain. Unfortunately our resources are limited. Because we do not have an unlimited amount of time, or available appointments, we are required to closely monitor and manage their use. The following rules exist to maximize their use:  Patient's responsibilities: 1. Punctuality: You are required to be physically present and registered in our facility at least 30 minutes before your appointment. 2. Tardiness: The cutoff is your appointment time. If you have an appointment scheduled for 10:00 AM and you arrive at 10:01, you will be required to reschedule your appointment.  3. Plan ahead: Always assume that you will encounter traffic on your way in. Plan for it. If you are dependent on a driver, make sure they understand these rules and the need to arrive early. 4. Other appointments and responsibilities: Avoid scheduling any other appointments before or after your pain clinic appointments.  5. Be prepared: Write down everything that you need to discuss with your healthcare provider and give this information to the admitting nurse. Write down the medications that you will need refilled. Bring your pills and bottles (even the empty ones), to all of your appointments, except for those where a procedure is scheduled. 6. No children or pets: Find someone to take care of them. It is not appropriate to bring them in. 7. Scheduling changes: We request "advanced notification" of any changes or cancellations. 8. Advanced notification: Defined as a time period of more than 24 hours prior to the originally scheduled appointment. This allows for the appointment to be offered to other patients. 9. Rescheduling: When a visit is rescheduled, it will require the  cancellation of the original appointment. For this reason they both fall within the category of "Cancellations".  10. Cancellations: They require advanced notification. Any cancellation less than 24 hours before the  appointment will be recorded as a "No Show". 11. No Show: Defined as an unkept appointment where the patient failed to notify or declare to the practice their intention or inability to keep the appointment.  Corrective process for repeat offenders:  1. Tardiness: Three (3) episodes of rescheduling due to late arrivals will be recorded as one (1) "No Show". 2. Cancellation or reschedule: Three (3) cancellations or rescheduling will be recorded as one (1) "No Show". 3. "No Shows": Three (3) "No Shows" within a 12 month period will result in discharge from the practice.  ____________________________________________________________________________________________  ____________________________________________________________________________________________  Medication Rules  Applies to: All patients receiving prescriptions (written or electronic).  Pharmacy of record: Pharmacy where electronic prescriptions will be sent. If written prescriptions are taken to a different pharmacy, please inform the nursing staff. The pharmacy listed in the electronic medical record should be the one where you would like electronic prescriptions to be sent.  Prescription refills: Only during scheduled appointments. Applies to both, written and electronic prescriptions.  NOTE: The following applies primarily to controlled substances (Opioid Pain Medications)  Patient's responsibilities: 12. Pain Pills: Bring all pain pills to every appointment (except for procedure appointments). 13. Pill Bottles: Bring pills in original pharmacy bottle. Always bring newest bottle. Bring bottle, even if empty. 14. Medication refills: You are responsible for knowing and keeping track of what medications you need  refilled. The day before your appointment, write a list of all prescriptions that need to be refilled. Bring that list to your appointment and give it to the admitting  nurse. Prescriptions will be written only during appointments. If you forget a medication, it will not be "Called in", "Faxed", or "electronically sent". You will need to get another appointment to get these prescribed. 15. Prescription Accuracy: You are responsible for carefully inspecting your prescriptions before leaving our office. Have the discharge nurse carefully go over each prescription with you, before taking them home. Make sure that your name is accurately spelled, that your address is correct. Check the name and dose of your medication to make sure it is accurate. Check the number of pills, and the written instructions to make sure they are clear and accurate. Make sure that you are given enough medication to last until your next medication refill appointment. 16. Taking Medication: Take medication as prescribed. Never take more pills than instructed. Never take medication more frequently than prescribed. Taking less pills or less frequently is permitted and encouraged, when it comes to controlled substances (written prescriptions).  17. Inform other Doctors: Always inform, all of your healthcare providers, of all the medications you take. 18. Pain Medication from other Providers: You are not allowed to accept any additional pain medication from any other Doctor or Healthcare provider. There are two exceptions to this rule. (see below) In the event that you require additional pain medication, you are responsible for notifying us, as stated below. 19. Medication Agreement: You are responsible for carefully reading and following our Medication Agreement. This must be signed before receiving any prescriptions from our practice. Safely store a copy of your signed Agreement. Violations to the Agreement will result in no further  prescriptions. (Additional copies of our Medication Agreement are available upon request.) 20. Laws, Rules, & Regulations: All patients are expected to follow all [address] and [employer], Rules, [place] [employer]. Ignorance of the Laws does not constitute a valid excuse.  Exceptions: There are only two exceptions to the rule of not receiving pain medications from other Healthcare Providers. 4. Exception #1 (Emergencies): In the event of an emergency (i.e.: accident requiring emergency care), you are allowed to receive additional pain medication. However, you are responsible for: As soon as you are able, call our office [phone], at any time of the day or night, and leave a message stating your name, the date and nature of the emergency, and the name and dose of the medication prescribed. In the event that your call is answered by a member of our staff, make sure to document and save the date, time, and the name of the person that took your information.  5. Exception #2 (Planned Surgery): In the event that you are scheduled by another doctor or dentist to have any type of surgery or procedure, you are allowed (for a period no longer than 30 days), to receive additional pain medication, for the acute post-op pain. However, in this case, you are responsible for picking up a copy of our "Post-op Pain Management for Surgeons" handout, and giving it to your surgeon or dentist. This document is available at our office, and does not require an appointment to obtain it. Simply go to our office during business hours (Monday-Thursday from 8:00 AM to 4:00 PM) (Friday 8:00 AM to 12:00 Noon) or if you have a scheduled appointment with [place], prior to your surgery, and ask for it by name. In addition, you will need to provide [place] with your name, name of your surgeon, type of surgery, and date of procedure or surgery.  _____________________________________________________________________________________________

## 2017-03-02 ENCOUNTER — Other Ambulatory Visit: Payer: Self-pay | Admitting: Pain Medicine

## 2017-03-05 ENCOUNTER — Ambulatory Visit: Payer: Medicaid Other | Attending: Pain Medicine | Admitting: Pain Medicine

## 2017-09-23 ENCOUNTER — Ambulatory Visit
Admission: RE | Admit: 2017-09-23 | Discharge: 2017-09-23 | Disposition: A | Discharge status: Home / Self Care | Payer: Medicaid Other | Source: Ambulatory Visit | Attending: Family Medicine | Admitting: Family Medicine

## 2017-09-23 ENCOUNTER — Other Ambulatory Visit: Payer: Self-pay | Admitting: Family Medicine

## 2017-09-23 DIAGNOSIS — X58XXXA Exposure to other specified factors, initial encounter: Secondary | ICD-10-CM | POA: Insufficient documentation

## 2017-09-23 DIAGNOSIS — S9032XA Contusion of left foot, initial encounter: Secondary | ICD-10-CM

## 2017-12-27 ENCOUNTER — Encounter: Payer: Self-pay | Admitting: Emergency Medicine

## 2017-12-27 ENCOUNTER — Emergency Department: Payer: Medicaid Other

## 2017-12-27 ENCOUNTER — Emergency Department
Admission: EM | Admit: 2017-12-27 | Discharge: 2017-12-27 | Disposition: A | Discharge status: Home / Self Care | Payer: Medicaid Other | Attending: Emergency Medicine | Admitting: Emergency Medicine

## 2017-12-27 DIAGNOSIS — E039 Hypothyroidism, unspecified: Secondary | ICD-10-CM | POA: Insufficient documentation

## 2017-12-27 DIAGNOSIS — R109 Unspecified abdominal pain: Secondary | ICD-10-CM | POA: Insufficient documentation

## 2017-12-27 DIAGNOSIS — F17210 Nicotine dependence, cigarettes, uncomplicated: Secondary | ICD-10-CM | POA: Insufficient documentation

## 2017-12-27 DIAGNOSIS — I509 Heart failure, unspecified: Secondary | ICD-10-CM | POA: Insufficient documentation

## 2017-12-27 DIAGNOSIS — J449 Chronic obstructive pulmonary disease, unspecified: Secondary | ICD-10-CM | POA: Insufficient documentation

## 2017-12-27 DIAGNOSIS — Z7901 Long term (current) use of anticoagulants: Secondary | ICD-10-CM | POA: Diagnosis not present

## 2017-12-27 DIAGNOSIS — Z79899 Other long term (current) drug therapy: Secondary | ICD-10-CM | POA: Diagnosis not present

## 2017-12-27 DIAGNOSIS — I2510 Atherosclerotic heart disease of native coronary artery without angina pectoris: Secondary | ICD-10-CM | POA: Diagnosis not present

## 2017-12-27 DIAGNOSIS — I110 Hypertensive heart disease with heart failure: Secondary | ICD-10-CM | POA: Insufficient documentation

## 2017-12-27 DIAGNOSIS — Z96651 Presence of right artificial knee joint: Secondary | ICD-10-CM | POA: Insufficient documentation

## 2017-12-27 DIAGNOSIS — I252 Old myocardial infarction: Secondary | ICD-10-CM | POA: Insufficient documentation

## 2017-12-27 DIAGNOSIS — A084 Viral intestinal infection, unspecified: Secondary | ICD-10-CM | POA: Insufficient documentation

## 2017-12-27 DIAGNOSIS — R112 Nausea with vomiting, unspecified: Secondary | ICD-10-CM | POA: Diagnosis present

## 2017-12-27 LAB — CBC
HCT: 44.5 % (ref 40.0–52.0)
Hemoglobin: 15.1 g/dL (ref 13.0–18.0)
MCH: 31.1 pg (ref 26.0–34.0)
MCHC: 33.8 g/dL (ref 32.0–36.0)
MCV: 92.1 fL (ref 80.0–100.0)
PLATELETS: 251 10*3/uL (ref 150–440)
RBC: 4.84 MIL/uL (ref 4.40–5.90)
RDW: 15.2 % — AB (ref 11.5–14.5)
WBC: 12 10*3/uL — AB (ref 3.8–10.6)

## 2017-12-27 LAB — URINALYSIS, COMPLETE (UACMP) WITH MICROSCOPIC
BILIRUBIN URINE: NEGATIVE
GLUCOSE, UA: NEGATIVE mg/dL
Hgb urine dipstick: NEGATIVE
KETONES UR: NEGATIVE mg/dL
LEUKOCYTES UA: NEGATIVE
NITRITE: NEGATIVE
PH: 8 (ref 5.0–8.0)
Protein, ur: NEGATIVE mg/dL
RBC / HPF: NONE SEEN RBC/hpf (ref 0–5)
SPECIFIC GRAVITY, URINE: 1.019 (ref 1.005–1.030)
Squamous Epithelial / LPF: NONE SEEN

## 2017-12-27 LAB — COMPREHENSIVE METABOLIC PANEL
ALT: 22 U/L (ref 17–63)
AST: 29 U/L (ref 15–41)
Albumin: 4.1 g/dL (ref 3.5–5.0)
Alkaline Phosphatase: 87 U/L (ref 38–126)
Anion gap: 9 (ref 5–15)
BUN: 11 mg/dL (ref 6–20)
CALCIUM: 9 mg/dL (ref 8.9–10.3)
CO2: 23 mmol/L (ref 22–32)
CREATININE: 0.96 mg/dL (ref 0.61–1.24)
Chloride: 105 mmol/L (ref 101–111)
GFR calc Af Amer: >60 mL/min (ref >60)
Glucose, Bld: 108 mg/dL — ABNORMAL HIGH (ref 65–99)
POTASSIUM: 3.5 mmol/L (ref 3.5–5.1)
Sodium: 137 mmol/L (ref 135–145)
Total Bilirubin: 0.5 mg/dL (ref 0.3–1.2)
Total Protein: 7.3 g/dL (ref 6.5–8.1)

## 2017-12-27 LAB — LIPASE, BLOOD: Lipase: 37 U/L (ref 11–51)

## 2017-12-27 MED ORDER — SODIUM CHLORIDE 0.9 % IV SOLN
1000.0000 mL | Freq: Once | INTRAVENOUS | Status: AC
  Administered 2017-12-27: 1000 mL via INTRAVENOUS

## 2017-12-27 MED ORDER — GI COCKTAIL ~~LOC~~
ORAL | Status: AC
  Filled 2017-12-27: qty 30

## 2017-12-27 MED ORDER — MORPHINE SULFATE (PF) 4 MG/ML IV SOLN
4.0000 mg | Freq: Once | INTRAVENOUS | Status: AC
  Administered 2017-12-27: 4 mg via INTRAVENOUS
  Filled 2017-12-27: qty 1

## 2017-12-27 MED ORDER — GI COCKTAIL ~~LOC~~
30.0000 mL | Freq: Once | ORAL | Status: AC
  Administered 2017-12-27: 30 mL via ORAL

## 2017-12-27 MED ORDER — ONDANSETRON HCL 4 MG/2ML IJ SOLN
4.0000 mg | Freq: Once | INTRAMUSCULAR | Status: AC
  Administered 2017-12-27: 4 mg via INTRAVENOUS
  Filled 2017-12-27: qty 2

## 2017-12-27 MED ORDER — IOHEXOL 300 MG/ML  SOLN
100.0000 mL | Freq: Once | INTRAMUSCULAR | Status: AC | PRN
  Administered 2017-12-27: 100 mL via INTRAVENOUS

## 2017-12-27 MED ORDER — ONDANSETRON 4 MG PO TBDP: 4.0000 mg | ORAL_TABLET | Freq: Three times a day (TID) | ORAL | 0 refills | Status: DC | PRN

## 2017-12-27 NOTE — ED Notes (Signed)
No emesis noted from 1900 to current time.
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
## 2017-12-27 NOTE — ED Notes (Signed)
Pt brought back from lobby = labs obtained. Pt c/o n/v x 4 days and unable to eat or drink. Dr ordered iv, fluids, meds and ct. Pt has hx of previous sbo. Belly tender on palpation per pt

## 2017-12-27 NOTE — ED Notes (Signed)
Pt up to void in urinal.

## 2017-12-27 NOTE — ED Notes (Signed)
Patient transported to CT 

## 2017-12-27 NOTE — ED Triage Notes (Signed)
Pt came to the Ed via POV c/o N/V/D for 4 days.  Patient states as of today he has had decreased urination and dry heaving with nothing coming up.  Patient has even and unlabored respirations at this time.  Patient denies any fevers that are known and documented at home.  Patient's skin is dry to the touch with dry lips.  Patient neurologically intact but states he has been increased weakness and lightheadedness.

## 2018-08-23 ENCOUNTER — Emergency Department: Payer: Medicaid Other

## 2018-08-23 ENCOUNTER — Inpatient Hospital Stay: Payer: Medicaid Other

## 2018-08-23 ENCOUNTER — Encounter: Payer: Self-pay | Admitting: Emergency Medicine

## 2018-08-23 ENCOUNTER — Inpatient Hospital Stay
Admission: EM | Admit: 2018-08-23 | Discharge: 2018-08-29 | DRG: 389 | Disposition: A | Discharge status: Home / Self Care | Payer: Medicaid Other | Attending: Internal Medicine | Admitting: Internal Medicine

## 2018-08-23 ENCOUNTER — Other Ambulatory Visit: Payer: Self-pay

## 2018-08-23 DIAGNOSIS — Z96653 Presence of artificial knee joint, bilateral: Secondary | ICD-10-CM | POA: Diagnosis present

## 2018-08-23 DIAGNOSIS — R109 Unspecified abdominal pain: Secondary | ICD-10-CM

## 2018-08-23 DIAGNOSIS — F17210 Nicotine dependence, cigarettes, uncomplicated: Secondary | ICD-10-CM | POA: Diagnosis present

## 2018-08-23 DIAGNOSIS — Z79891 Long term (current) use of opiate analgesic: Secondary | ICD-10-CM | POA: Diagnosis not present

## 2018-08-23 DIAGNOSIS — J441 Chronic obstructive pulmonary disease with (acute) exacerbation: Secondary | ICD-10-CM | POA: Diagnosis present

## 2018-08-23 DIAGNOSIS — K56609 Unspecified intestinal obstruction, unspecified as to partial versus complete obstruction: Secondary | ICD-10-CM | POA: Diagnosis present

## 2018-08-23 DIAGNOSIS — F319 Bipolar disorder, unspecified: Secondary | ICD-10-CM | POA: Diagnosis present

## 2018-08-23 DIAGNOSIS — Z8249 Family history of ischemic heart disease and other diseases of the circulatory system: Secondary | ICD-10-CM

## 2018-08-23 DIAGNOSIS — Z86718 Personal history of other venous thrombosis and embolism: Secondary | ICD-10-CM | POA: Diagnosis not present

## 2018-08-23 DIAGNOSIS — Z818 Family history of other mental and behavioral disorders: Secondary | ICD-10-CM

## 2018-08-23 DIAGNOSIS — R0902 Hypoxemia: Secondary | ICD-10-CM

## 2018-08-23 DIAGNOSIS — K5651 Intestinal adhesions [bands], with partial obstruction: Principal | ICD-10-CM | POA: Diagnosis present

## 2018-08-23 DIAGNOSIS — G709 Myoneural disorder, unspecified: Secondary | ICD-10-CM | POA: Diagnosis present

## 2018-08-23 DIAGNOSIS — E039 Hypothyroidism, unspecified: Secondary | ICD-10-CM | POA: Diagnosis present

## 2018-08-23 DIAGNOSIS — I110 Hypertensive heart disease with heart failure: Secondary | ICD-10-CM | POA: Diagnosis present

## 2018-08-23 DIAGNOSIS — Z86711 Personal history of pulmonary embolism: Secondary | ICD-10-CM | POA: Diagnosis not present

## 2018-08-23 DIAGNOSIS — Z79899 Other long term (current) drug therapy: Secondary | ICD-10-CM | POA: Diagnosis not present

## 2018-08-23 DIAGNOSIS — Z833 Family history of diabetes mellitus: Secondary | ICD-10-CM | POA: Diagnosis not present

## 2018-08-23 DIAGNOSIS — Z23 Encounter for immunization: Secondary | ICD-10-CM

## 2018-08-23 DIAGNOSIS — Z836 Family history of other diseases of the respiratory system: Secondary | ICD-10-CM

## 2018-08-23 DIAGNOSIS — Z888 Allergy status to other drugs, medicaments and biological substances status: Secondary | ICD-10-CM

## 2018-08-23 DIAGNOSIS — I2510 Atherosclerotic heart disease of native coronary artery without angina pectoris: Secondary | ICD-10-CM | POA: Diagnosis present

## 2018-08-23 DIAGNOSIS — Z7901 Long term (current) use of anticoagulants: Secondary | ICD-10-CM | POA: Diagnosis not present

## 2018-08-23 DIAGNOSIS — E876 Hypokalemia: Secondary | ICD-10-CM | POA: Diagnosis present

## 2018-08-23 DIAGNOSIS — K219 Gastro-esophageal reflux disease without esophagitis: Secondary | ICD-10-CM | POA: Diagnosis present

## 2018-08-23 DIAGNOSIS — Z79890 Hormone replacement therapy (postmenopausal): Secondary | ICD-10-CM | POA: Diagnosis not present

## 2018-08-23 DIAGNOSIS — Z882 Allergy status to sulfonamides status: Secondary | ICD-10-CM | POA: Diagnosis not present

## 2018-08-23 DIAGNOSIS — I5032 Chronic diastolic (congestive) heart failure: Secondary | ICD-10-CM | POA: Diagnosis present

## 2018-08-23 LAB — URINALYSIS, COMPLETE (UACMP) WITH MICROSCOPIC
Bacteria, UA: NONE SEEN
Bilirubin Urine: NEGATIVE
GLUCOSE, UA: NEGATIVE mg/dL
Hgb urine dipstick: NEGATIVE
Ketones, ur: 5 mg/dL — AB
Leukocytes, UA: NEGATIVE
NITRITE: NEGATIVE
PROTEIN: 30 mg/dL — AB
SPECIFIC GRAVITY, URINE: 1.024 (ref 1.005–1.030)
pH: 7 (ref 5.0–8.0)

## 2018-08-23 LAB — COMPREHENSIVE METABOLIC PANEL
ALT: 26 U/L (ref 0–44)
AST: 29 U/L (ref 15–41)
Albumin: 4.2 g/dL (ref 3.5–5.0)
Alkaline Phosphatase: 64 U/L (ref 38–126)
Anion gap: 13 (ref 5–15)
BILIRUBIN TOTAL: 0.8 mg/dL (ref 0.3–1.2)
BUN: 20 mg/dL (ref 6–20)
CO2: 29 mmol/L (ref 22–32)
CREATININE: 1.12 mg/dL (ref 0.61–1.24)
Calcium: 9.8 mg/dL (ref 8.9–10.3)
Chloride: 98 mmol/L (ref 98–111)
GFR calc Af Amer: >60 mL/min (ref >60)
Glucose, Bld: 142 mg/dL — ABNORMAL HIGH (ref 70–99)
POTASSIUM: 3.1 mmol/L — AB (ref 3.5–5.1)
Sodium: 140 mmol/L (ref 135–145)
TOTAL PROTEIN: 7.6 g/dL (ref 6.5–8.1)

## 2018-08-23 LAB — CBC
HCT: 52.1 % — ABNORMAL HIGH (ref 39.0–52.0)
Hemoglobin: 17.3 g/dL — ABNORMAL HIGH (ref 13.0–17.0)
MCH: 30.9 pg (ref 26.0–34.0)
MCHC: 33.2 g/dL (ref 30.0–36.0)
MCV: 93.2 fL (ref 80.0–100.0)
NRBC: 0 % (ref 0.0–0.2)
PLATELETS: 376 10*3/uL (ref 150–400)
RBC: 5.59 MIL/uL (ref 4.22–5.81)
RDW: 13.8 % (ref 11.5–15.5)
WBC: 15.1 10*3/uL — AB (ref 4.0–10.5)

## 2018-08-23 LAB — LIPASE, BLOOD: Lipase: 72 U/L — ABNORMAL HIGH (ref 11–51)

## 2018-08-23 LAB — PROTIME-INR
INR: 1.01
PROTHROMBIN TIME: 13.2 s (ref 11.4–15.2)

## 2018-08-23 LAB — MAGNESIUM: Magnesium: 2 mg/dL (ref 1.7–2.4)

## 2018-08-23 MED ORDER — ONDANSETRON HCL 4 MG/2ML IJ SOLN
4.0000 mg | Freq: Four times a day (QID) | INTRAMUSCULAR | Status: DC | PRN
  Administered 2018-08-24 – 2018-08-26 (×3): 4 mg via INTRAVENOUS
  Filled 2018-08-23 (×3): qty 2

## 2018-08-23 MED ORDER — IOPAMIDOL (ISOVUE-300) INJECTION 61%
100.0000 mL | Freq: Once | INTRAVENOUS | Status: AC | PRN
  Administered 2018-08-23: 100 mL via INTRAVENOUS
  Filled 2018-08-23: qty 100

## 2018-08-23 MED ORDER — MORPHINE SULFATE (PF) 4 MG/ML IV SOLN
4.0000 mg | Freq: Once | INTRAVENOUS | Status: AC
  Administered 2018-08-23: 4 mg via INTRAVENOUS

## 2018-08-23 MED ORDER — PRAVASTATIN SODIUM 20 MG PO TABS
40.0000 mg | ORAL_TABLET | Freq: Every day | ORAL | Status: DC
  Administered 2018-08-23 – 2018-08-28 (×4): 40 mg via ORAL
  Filled 2018-08-23 (×5): qty 2

## 2018-08-23 MED ORDER — POTASSIUM CHLORIDE 20 MEQ PO PACK
40.0000 meq | PACK | Freq: Once | ORAL | Status: AC
  Administered 2018-08-23: 40 meq via ORAL
  Filled 2018-08-23: qty 2

## 2018-08-23 MED ORDER — ZIPRASIDONE HCL 40 MG PO CAPS
60.0000 mg | ORAL_CAPSULE | Freq: Two times a day (BID) | ORAL | Status: DC
  Administered 2018-08-24 – 2018-08-29 (×6): 60 mg via ORAL
  Filled 2018-08-23 (×12): qty 1

## 2018-08-23 MED ORDER — ACETAMINOPHEN 650 MG RE SUPP: 650.0000 mg | Freq: Four times a day (QID) | RECTAL | Status: DC | PRN

## 2018-08-23 MED ORDER — METOPROLOL SUCCINATE ER 50 MG PO TB24
50.0000 mg | ORAL_TABLET | Freq: Every day | ORAL | Status: DC
  Filled 2018-08-23: qty 1

## 2018-08-23 MED ORDER — HYDRALAZINE HCL 20 MG/ML IJ SOLN: 10.0000 mg | INTRAMUSCULAR | Status: DC | PRN

## 2018-08-23 MED ORDER — LEVOTHYROXINE SODIUM 50 MCG PO TABS
50.0000 ug | ORAL_TABLET | Freq: Every day | ORAL | Status: DC
  Administered 2018-08-24 – 2018-08-29 (×6): 50 ug via ORAL
  Filled 2018-08-23 (×6): qty 1

## 2018-08-23 MED ORDER — NICOTINE 14 MG/24HR TD PT24
14.0000 mg | MEDICATED_PATCH | Freq: Every day | TRANSDERMAL | Status: DC
  Administered 2018-08-23 – 2018-08-29 (×7): 14 mg via TRANSDERMAL
  Filled 2018-08-23 (×7): qty 1

## 2018-08-23 MED ORDER — CLONAZEPAM 1 MG PO TABS
2.0000 mg | ORAL_TABLET | Freq: Every day | ORAL | Status: DC
  Administered 2018-08-24 – 2018-08-28 (×5): 2 mg via ORAL
  Filled 2018-08-23 (×5): qty 2

## 2018-08-23 MED ORDER — TAMSULOSIN HCL 0.4 MG PO CAPS
0.4000 mg | ORAL_CAPSULE | Freq: Every day | ORAL | Status: DC
  Administered 2018-08-23 – 2018-08-29 (×6): 0.4 mg via ORAL
  Filled 2018-08-23 (×6): qty 1

## 2018-08-23 MED ORDER — MIRTAZAPINE 15 MG PO TABS
15.0000 mg | ORAL_TABLET | Freq: Every day | ORAL | Status: DC
  Administered 2018-08-24 – 2018-08-29 (×5): 15 mg via ORAL
  Filled 2018-08-23 (×5): qty 1

## 2018-08-23 MED ORDER — ONDANSETRON HCL 4 MG PO TABS: 4.0000 mg | ORAL_TABLET | Freq: Four times a day (QID) | ORAL | Status: DC | PRN

## 2018-08-23 MED ORDER — MORPHINE SULFATE (PF) 4 MG/ML IV SOLN
INTRAVENOUS | Status: AC
  Filled 2018-08-23: qty 1

## 2018-08-23 MED ORDER — NITROGLYCERIN 0.4 MG SL SUBL: 0.4000 mg | SUBLINGUAL_TABLET | SUBLINGUAL | Status: DC | PRN

## 2018-08-23 MED ORDER — TRAZODONE HCL 100 MG PO TABS
300.0000 mg | ORAL_TABLET | Freq: Every day | ORAL | Status: DC
  Administered 2018-08-23 – 2018-08-28 (×6): 300 mg via ORAL
  Filled 2018-08-23 (×6): qty 3

## 2018-08-23 MED ORDER — IPRATROPIUM-ALBUTEROL 0.5-2.5 (3) MG/3ML IN SOLN
3.0000 mL | Freq: Once | RESPIRATORY_TRACT | Status: AC
  Administered 2018-08-23: 3 mL via RESPIRATORY_TRACT
  Filled 2018-08-23: qty 3

## 2018-08-23 MED ORDER — LORATADINE 10 MG PO TABS
10.0000 mg | ORAL_TABLET | Freq: Every day | ORAL | Status: DC
  Administered 2018-08-24 – 2018-08-29 (×5): 10 mg via ORAL
  Filled 2018-08-23 (×5): qty 1

## 2018-08-23 MED ORDER — BUDESONIDE 0.5 MG/2ML IN SUSP
0.5000 mg | Freq: Two times a day (BID) | RESPIRATORY_TRACT | Status: DC
  Administered 2018-08-23 – 2018-08-25 (×4): 0.5 mg via RESPIRATORY_TRACT
  Filled 2018-08-23 (×5): qty 2

## 2018-08-23 MED ORDER — ONDANSETRON HCL 4 MG/2ML IJ SOLN
4.0000 mg | Freq: Once | INTRAMUSCULAR | Status: AC
  Administered 2018-08-23: 4 mg via INTRAVENOUS
  Filled 2018-08-23: qty 2

## 2018-08-23 MED ORDER — MORPHINE SULFATE (PF) 4 MG/ML IV SOLN
4.0000 mg | Freq: Once | INTRAVENOUS | Status: AC
  Administered 2018-08-23: 4 mg via INTRAVENOUS
  Filled 2018-08-23: qty 1

## 2018-08-23 MED ORDER — PREGABALIN 75 MG PO CAPS
75.0000 mg | ORAL_CAPSULE | Freq: Three times a day (TID) | ORAL | Status: DC
  Administered 2018-08-24 – 2018-08-29 (×14): 75 mg via ORAL
  Filled 2018-08-23 (×15): qty 1

## 2018-08-23 MED ORDER — MORPHINE SULFATE (PF) 2 MG/ML IV SOLN
2.0000 mg | INTRAVENOUS | Status: DC | PRN
  Administered 2018-08-23 – 2018-08-27 (×8): 2 mg via INTRAVENOUS
  Filled 2018-08-23 (×8): qty 1

## 2018-08-23 MED ORDER — KCL IN DEXTROSE-NACL 40-5-0.45 MEQ/L-%-% IV SOLN
INTRAVENOUS | Status: DC
  Administered 2018-08-23 – 2018-08-26 (×6): via INTRAVENOUS
  Filled 2018-08-23 (×9): qty 1000

## 2018-08-23 MED ORDER — ACETAMINOPHEN 325 MG PO TABS
650.0000 mg | ORAL_TABLET | Freq: Four times a day (QID) | ORAL | Status: DC | PRN
  Administered 2018-08-27 – 2018-08-28 (×2): 650 mg via ORAL
  Filled 2018-08-23 (×2): qty 2

## 2018-08-23 MED ORDER — ATOMOXETINE HCL 10 MG PO CAPS
40.0000 mg | ORAL_CAPSULE | Freq: Two times a day (BID) | ORAL | Status: DC
  Administered 2018-08-24 – 2018-08-29 (×6): 40 mg via ORAL
  Filled 2018-08-23 (×12): qty 4

## 2018-08-23 MED ORDER — FUROSEMIDE 20 MG PO TABS
20.0000 mg | ORAL_TABLET | Freq: Every day | ORAL | Status: DC
  Administered 2018-08-23: 20 mg via ORAL
  Filled 2018-08-23: qty 1

## 2018-08-23 MED ORDER — IPRATROPIUM-ALBUTEROL 0.5-2.5 (3) MG/3ML IN SOLN
3.0000 mL | Freq: Four times a day (QID) | RESPIRATORY_TRACT | Status: DC
  Administered 2018-08-23 – 2018-08-25 (×7): 3 mL via RESPIRATORY_TRACT
  Filled 2018-08-23 (×8): qty 3

## 2018-08-23 MED ORDER — INFLUENZA VAC SPLIT QUAD 0.5 ML IM SUSY
0.5000 mL | PREFILLED_SYRINGE | INTRAMUSCULAR | Status: AC
  Administered 2018-08-24: 0.5 mL via INTRAMUSCULAR
  Filled 2018-08-23: qty 0.5

## 2018-08-23 MED ORDER — PANTOPRAZOLE SODIUM 40 MG IV SOLR
40.0000 mg | INTRAVENOUS | Status: DC
  Administered 2018-08-23 – 2018-08-28 (×6): 40 mg via INTRAVENOUS
  Filled 2018-08-23 (×6): qty 40

## 2018-08-23 MED ORDER — ALBUTEROL SULFATE (2.5 MG/3ML) 0.083% IN NEBU: 2.5000 mg | INHALATION_SOLUTION | Freq: Four times a day (QID) | RESPIRATORY_TRACT | Status: DC | PRN

## 2018-08-23 MED ORDER — FLUTICASONE PROPIONATE 50 MCG/ACT NA SUSP
2.0000 | Freq: Every day | NASAL | Status: DC
  Administered 2018-08-25 – 2018-08-29 (×4): 2 via NASAL
  Filled 2018-08-23: qty 16

## 2018-08-23 MED ORDER — FINASTERIDE 5 MG PO TABS
5.0000 mg | ORAL_TABLET | Freq: Every day | ORAL | Status: DC
  Administered 2018-08-24 – 2018-08-29 (×5): 5 mg via ORAL
  Filled 2018-08-23 (×5): qty 1

## 2018-08-23 MED ORDER — ISOSORBIDE MONONITRATE ER 30 MG PO TB24
30.0000 mg | ORAL_TABLET | Freq: Every day | ORAL | Status: DC
  Administered 2018-08-23 – 2018-08-28 (×3): 30 mg via ORAL
  Filled 2018-08-23 (×4): qty 1

## 2018-08-23 MED ORDER — DULOXETINE HCL 60 MG PO CPEP
60.0000 mg | ORAL_CAPSULE | Freq: Every day | ORAL | Status: DC
  Administered 2018-08-24 – 2018-08-29 (×5): 60 mg via ORAL
  Filled 2018-08-23 (×6): qty 1

## 2018-08-23 MED ORDER — POTASSIUM CITRATE ER 10 MEQ (1080 MG) PO TBCR
10.0000 meq | EXTENDED_RELEASE_TABLET | Freq: Three times a day (TID) | ORAL | Status: DC
  Administered 2018-08-24 – 2018-08-29 (×9): 10 meq via ORAL
  Filled 2018-08-23 (×18): qty 1
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
## 2018-08-23 NOTE — ED Notes (Signed)
ED TO INPATIENT HANDOFF REPORT  Name/Age/Gender Brett Fernandez 57 y.o. male  Code Status   Home/SNF/Other Home  Chief Complaint NVD  Level of Care/Admitting Diagnosis ED Disposition    ED Disposition Condition Comment   Admit  Hospital Area: [facility] [[id number]]  Level of Care: Med-Surg [16]  Diagnosis: SBO (small bowel obstruction) [facility]) [[id number]]  Admitting Physician: Bertrum Sol [[id number]]  Attending Physician: Bertrum Sol [[id number]]  Estimated length of stay: past midnight tomorrow  Certification:: I certify this patient will need inpatient services for at least 2 midnights  PT Class (Do Not Modify): Inpatient [101]  PT Acc Code (Do Not Modify): Private [1]       Medical History Past Medical History:  Diagnosis Date  . Allergy   . Anginal pain (HCC)   . Bipolar disorder (HCC)   . BPH (benign prostatic hypertrophy)   . CHF (congestive heart failure) (HCC)   . COPD (chronic obstructive pulmonary disease) (HCC)   . COPD exacerbation (HCC) 11/02/2015  . Coronary artery disease   . Deep vein thrombosis (DVT) (HCC)   . DJD (degenerative joint disease)   . Gastric ulcer   . GERD (gastroesophageal reflux disease)   . History of hiatal hernia   . Hypertension   . Hypothyroidism   . Kidney stones   . Lethargy 11/02/2015  . Neuromuscular disorder (HCC)   . PTSD (post-traumatic stress disorder)     Allergies Allergies  Allergen Reactions  . Aspirin Other (See Comments)    Other Reaction: steven-johnson reaction [name]  . Bee Venom Anaphylaxis  . Sulfa Antibiotics Swelling    IV Location/Drains/Wounds Patient Lines/Drains/Airways Status   Active Line/Drains/Airways    Name:   Placement date:   Placement time:   Site:   Days:   Peripheral IV 11/03/15 Right Forearm   11/03/15    0633    Forearm   1024   Peripheral IV 11/04/15 Right Forearm   11/04/15    0816    Forearm   1023   Peripheral IV 12/15/16 Right  Antecubital   12/15/16    0940    Antecubital   616   Peripheral IV 08/23/18 Right Antecubital   08/23/18    1737    Antecubital   less than 1   Closed System Drain Right Knee Autotranfusion system (Autovac) 10 Fr.   11/01/15    1012    Knee   1026   Closed System Drain Right Knee Accordion (Hemovac)   -    -    Knee      NG/OG Tube Nasogastric 16 Fr. Left nare Aucultation;Xray Documented cm marking at nare/ corner of mouth 65 cm   08/23/18    1926    Left nare   less than 1   Airway   11/01/15    0714     1026   Incision (Closed) 11/01/15 Knee Right   11/01/15    0930     1026          Labs/Imaging Results for orders placed or performed during the hospital encounter of 08/23/18 (from the past 48 hour(s))  Lipase, blood     Status: Abnormal   Collection Time: 08/23/18  4:10 PM  Result Value Ref Range   Lipase 72 (H) 11 - 51 U/L    Comment: Performed at [facility], [address] [place], [place] [address]  Comprehensive metabolic panel  Status: Abnormal   Collection Time: 08/23/18  4:10 PM  Result Value Ref Range   Sodium 140 135 - 145 mmol/L   Potassium 3.1 (L) 3.5 - 5.1 mmol/L   Chloride 98 98 - 111 mmol/L   CO2 29 22 - 32 mmol/L   Glucose, Bld 142 (H) 70 - 99 mg/dL   BUN 20 6 - 20 mg/dL   Creatinine, Ser 0.34 0.61 - 1.24 mg/dL   Calcium 9.8 8.9 - 74.2 mg/dL   Total Protein 7.6 6.5 - 8.1 g/dL   Albumin 4.2 3.5 - 5.0 g/dL   AST 29 15 - 41 U/L   ALT 26 0 - 44 U/L   Alkaline Phosphatase 64 38 - 126 U/L   Total Bilirubin 0.8 0.3 - 1.2 mg/dL   GFR calc non Af Amer >60 >60 mL/min   GFR calc Af Amer >60 >60 mL/min   Anion gap 13 5 - 15    Comment: Performed at [facility], [address] [place], [place] [address]  CBC     Status: Abnormal   Collection Time: 08/23/18  4:10 PM  Result Value Ref Range   WBC 15.1 (H) 4.0 - 10.5 K/uL   RBC 5.59 4.22 - 5.81 MIL/uL   Hemoglobin 17.3 (H) 13.0 - 17.0 g/dL   HCT 87.5 (H) 64.3 - 32.9 %   MCV 93.2 80.0 -  100.0 fL   MCH 30.9 26.0 - 34.0 pg   MCHC 33.2 30.0 - 36.0 g/dL   RDW 51.8 84.1 - 66.0 %   Platelets 376 150 - 400 K/uL   nRBC 0.0 0.0 - 0.2 %    Comment: Performed at [facility], [address] [place], [place] [address]  Urinalysis, Complete w Microscopic     Status: Abnormal   Collection Time: 08/23/18  4:10 PM  Result Value Ref Range   Color, Urine AMBER (A) YELLOW    Comment: BIOCHEMICALS MAY BE AFFECTED BY COLOR   APPearance CLEAR (A) CLEAR   Specific Gravity, Urine 1.024 1.005 - 1.030   pH 7.0 5.0 - 8.0   Glucose, UA NEGATIVE NEGATIVE mg/dL   Hgb urine dipstick NEGATIVE NEGATIVE   Bilirubin Urine NEGATIVE NEGATIVE   Ketones, ur 5 (A) NEGATIVE mg/dL   Protein, ur 30 (A) NEGATIVE mg/dL   Nitrite NEGATIVE NEGATIVE   Leukocytes, UA NEGATIVE NEGATIVE   RBC / HPF 0-5 0 - 5 RBC/hpf   WBC, UA 0-5 0 - 5 WBC/hpf   Bacteria, UA NONE SEEN NONE SEEN   Squamous Epithelial / LPF 0-5 0 - 5   Mucus PRESENT    Hyaline Casts, UA PRESENT     Comment: Performed at [facility] [address] [place], [place] [address]   Dg Chest 1 View  Result Date: 08/23/2018 CLINICAL DATA:  Hypoxia EXAM: CHEST  1 VIEW COMPARISON:  01/03/2016 FINDINGS: Esophageal tube tip is in the left upper quadrant. Atelectasis at the left base. No acute consolidation or effusion. Normal heart size. No pneumothorax. Apical blebs. IMPRESSION: Mild subsegmental atelectasis at the left base. Electronically Signed   By: Jasmine Pang M.D.   On: 08/23/2018 20:24   Dg Abdomen 1 View  Result Date: 08/23/2018 CLINICAL DATA:  Nasogastric tube placement. EXAM: ABDOMEN - 1 VIEW COMPARISON:  CT abdomen and pelvis August 23, 2018 FINDINGS: Nasogastric tube tip projects in proximal stomach, side-port at GE junction region. Multiple loops of gas distended small bowel, incompletely evaluated. LEFT lung base atelectasis/scarring.  Soft tissue planes and included osseous structures are unchanged. IMPRESSION:  Nasogastric tube tip projects in proximal stomach. Electronically Signed   By: Awilda Metro M.D.   On: 08/23/2018 19:54   Ct Abdomen Pelvis W Contrast  Result Date: 08/23/2018 CLINICAL DATA:  Nausea vomiting and diarrhea EXAM: CT ABDOMEN AND PELVIS WITH CONTRAST TECHNIQUE: Multidetector CT imaging of the abdomen and pelvis was performed using the standard protocol following bolus administration of intravenous contrast. CONTRAST:  ISOVUE-300 IOPAMIDOL (ISOVUE-300) INJECTION 61% COMPARISON:  CT 12/27/2017 FINDINGS: Lower chest: Mild atelectasis or scarring at the lingula. No acute consolidation or effusion. Stable pleural nodularity at the right central lung base. Heart size within normal limits. Fat density at the hiatus with mild mass effect on the IVC. Hepatobiliary: No focal liver abnormality is seen. No gallstones, gallbladder wall thickening, or biliary dilatation. Pancreas: Unremarkable. No pancreatic ductal dilatation or surrounding inflammatory changes. Spleen: Normal in size without focal abnormality. Adrenals/Urinary Tract: Adrenal glands are normal. No hydronephrosis. Multiple intrarenal stones bilaterally. The largest is seen on the left and measures up to 9 mm in size. There are cysts within the kidneys. The bladder is normal Stomach/Bowel: The stomach is slightly fluid-filled and dilated. Multiple loops of fluid-filled dilated small bowel measuring up to 3.9 cm. Transition points slightly to the right of midline near surgical sutures within the central abdomen. Additional transition to non dilated distal small bowel in the right lower quadrant, possible adhesion. No bowel wall thickening. Sigmoid colon diverticula without acute inflammatory change. Vascular/Lymphatic: Moderate aortic atherosclerosis. No aneurysm. No significantly enlarged lymph nodes. Reproductive: Negative Other: Negative for free air or free fluid. Small fat in the left inguinal canal. Musculoskeletal: No acute or  significant osseous findings. IMPRESSION: 1. Multiple loops of dilated fluid-filled small bowel with transition points visualized within the right central abdomen and right lower quadrant, consistent with mechanical small bowel obstruction. Negative for free air or free fluid. 2. Multiple nonobstructing kidney stones 3. Descending and sigmoid colon diverticula without acute inflammatory change. Electronically Signed   By: Jasmine Pang M.D.   On: 08/23/2018 18:40    Pending Labs [employer] (From admission, onward)    Start     Ordered   Signed and Held  HIV antibody (Routine Testing)  Once,   R     Signed and Held   Signed and Armed forces training and education officer morning,   R     Signed and Held   Medical illustrator and Held  CBC  Tomorrow morning,   R     Signed and Held   Signed and Held  Protime-INR  Once,   R     Signed and Held   Signed and Held  Protime-INR  Daily,   R     Signed and Held   Signed and Held  Magnesium  Once,   R     Signed and Held          Vitals/Pain Today's Vitals   08/23/18 1600 08/23/18 1706 08/23/18 1916  BP: (!) 124/95 (!) 129/98   Pulse: (!) 103 (!) 102   Resp: 20 (!) 22   Temp: 98.1 F (36.7 C) 98.1 F (36.7 C)   TempSrc: Oral Oral   SpO2: 99% 100%   Weight: 81.6 kg    Height: 5\' 7"  (1.702 m)    PainSc: 9  9  7      Isolation Precautions No active isolations  Medications Medications  morphine 2 MG/ML injection  2 mg (has no administration in time range)  budesonide (PULMICORT) nebulizer solution 0.5 mg (has no administration in time range)  ipratropium-albuterol (DUONEB) 0.5-2.5 (3) MG/3ML nebulizer solution 3 mL (has no administration in time range)  pantoprazole (PROTONIX) injection 40 mg (has no administration in time range)  morphine 4 MG/ML injection 4 mg (4 mg Intravenous Given 08/23/18 1734)  ondansetron (ZOFRAN) injection 4 mg (4 mg Intravenous Given 08/23/18 1734)  ipratropium-albuterol (DUONEB) 0.5-2.5 (3) MG/3ML nebulizer solution 3 mL  (3 mLs Nebulization Given 08/23/18 1734)  iopamidol (ISOVUE-300) 61 % injection 100 mL (100 mLs Intravenous Contrast Given 08/23/18 1749)  morphine 4 MG/ML injection 4 mg (4 mg Intravenous Given 08/23/18 1917)    Mobility walks

## 2018-08-23 NOTE — H&P (Signed)
[facility] [place] at [facility]   PATIENT NAME: Brett Fernandez    MR#:  [id number]  DATE OF BIRTH:  08/09/1961  DATE OF ADMISSION:  08/23/2018  PRIMARY CARE PHYSICIAN: Dortha Kern, MD   REQUESTING/REFERRING PHYSICIAN:   CHIEF COMPLAINT:   Chief Complaint  Patient presents with  . Abdominal Pain  . Emesis    HISTORY OF PRESENT ILLNESS: Brett Fernandez  is a 57 y.o. male with a known history per below presenting with nausea, vomiting, loose stools for 3 days, associated with shortness of breath, O2 saturation 86 percent on room air, potassium was 3.1, urinalysis noted for ketones, white count 15,000, chest x-ray pending, CT abdomen noted for small bowel obstruction with transition point, general surgery notified per ED attending/Dr. Aleen Campi, patient evaluated in the emergency room, noted to be in mild distress, family at the bedside, patient is now been admitted for acute recurrent small bowel obstruction.  PAST MEDICAL HISTORY:   Past Medical History:  Diagnosis Date  . Allergy   . Anginal pain (HCC)   . Bipolar disorder (HCC)   . BPH (benign prostatic hypertrophy)   . CHF (congestive heart failure) (HCC)   . COPD (chronic obstructive pulmonary disease) (HCC)   . COPD exacerbation (HCC) 11/02/2015  . Coronary artery disease   . Deep vein thrombosis (DVT) (HCC)   . DJD (degenerative joint disease)   . Gastric ulcer   . GERD (gastroesophageal reflux disease)   . History of hiatal hernia   . Hypertension   . Hypothyroidism   . Kidney stones   . Lethargy 11/02/2015  . Neuromuscular disorder (HCC)   . PTSD (post-traumatic stress disorder)     PAST SURGICAL HISTORY:  Past Surgical History:  Procedure Laterality Date  . APPENDECTOMY    . colon polyps    . HEMORRHOIDECTOMY WITH HEMORRHOID BANDING    . HERNIA REPAIR    . INNER EAR SURGERY    . JOINT REPLACEMENT Left   . TOTAL KNEE ARTHROPLASTY Right 11/01/2015   Procedure: TOTAL KNEE ARTHROPLASTY;   Surgeon: Juanell Fairly, MD;  Location: [facility] ORS;  Service: Orthopedics;  Laterality: Right;    SOCIAL HISTORY:  Social History   Tobacco Use  . Smoking status: Current Every Day Smoker    Packs/day: 0.50    Types: Cigarettes  . Smokeless tobacco: Never Used  . Tobacco comment: cutting down, not ready to quit  Substance Use Topics  . Alcohol use: No    FAMILY HISTORY:  Family History  Problem Relation Age of Onset  . Depression Mother   . Diabetes Mother   . Heart disease Mother   . COPD Mother   . Heart disease Father   . Cancer Father     DRUG ALLERGIES:  Allergies  Allergen Reactions  . Aspirin Other (See Comments)    Other Reaction: steven-johnson reaction [name]  . Bee Venom Anaphylaxis  . Sulfa Antibiotics Swelling    REVIEW OF SYSTEMS:   CONSTITUTIONAL: No fever, fatigue or weakness.  EYES: No blurred or double vision.  EARS, NOSE, AND THROAT: No tinnitus or ear pain.  RESPIRATORY: No cough, shortness of breath, wheezing or hemoptysis.  CARDIOVASCULAR: No chest pain, orthopnea, edema.  GASTROINTESTINAL: + nausea, vomiting, diarrhea, abdominal pain.  GENITOURINARY: No dysuria, hematuria.  ENDOCRINE: No polyuria, nocturia,  HEMATOLOGY: No anemia, easy bruising or bleeding SKIN: No rash or lesion. MUSCULOSKELETAL: No joint pain or arthritis.   NEUROLOGIC: No tingling, numbness, weakness.  PSYCHIATRY: No anxiety or depression.   MEDICATIONS AT HOME:  Prior to Admission medications   Medication Sig Start Date End Date Taking? Authorizing Provider  warfarin (COUMADIN) 4 MG tablet Take 4 mg by mouth every evening.    Yes [provider]  albuterol (PROVENTIL HFA;VENTOLIN HFA) 108 (90 Base) MCG/ACT inhaler Inhale 2 puffs into the lungs every 6 (six) hours as needed for wheezing or shortness of breath.    [provider]  atomoxetine (STRATTERA) 40 MG capsule Take 40 mg by mouth 2 (two) times daily with a meal.    [provider]  clonazePAM (KLONOPIN) 2 MG tablet Take 2 mg by mouth at bedtime.    [provider]  DULoxetine (CYMBALTA) 60 MG capsule Take 60 mg by mouth daily.     [provider]  finasteride (PROSCAR) 5 MG tablet Take 5 mg by mouth daily.     [provider]  fluticasone (FLONASE) 50 MCG/ACT nasal spray Place 2 sprays into both nostrils daily.     [provider]  furosemide (LASIX) 20 MG tablet Take 20 mg by mouth daily.     [provider]  isosorbide mononitrate (IMDUR) 30 MG 24 hr tablet Take 30 mg by mouth daily.  01/23/16 01/22/17  [provider]  levothyroxine (SYNTHROID, LEVOTHROID) 50 MCG tablet Take 50 mcg by mouth daily before breakfast.     [provider]  loratadine (CLARITIN) 10 MG tablet Take 10 mg by mouth daily.     [provider]  metoprolol succinate (TOPROL-XL) 50 MG 24 hr tablet Take 50 mg by mouth daily.     [provider]  mirtazapine (REMERON) 15 MG tablet Take 15 mg by mouth daily.    [provider]  nitroGLYCERIN (NITROSTAT) 0.4 MG SL tablet Place 1 tablet under tongue three times a day as needed [TAKE 1 TABLET EVERY 5 MINUTES FOR CHEST PAIN. IF MORE THAN 3 CALL 911]    [provider]  omeprazole (PRILOSEC) 40 MG capsule Take 40 mg by mouth daily.     [provider]  ondansetron (ZOFRAN ODT) 4 MG disintegrating tablet Take 1 tablet (4 mg total) by mouth every 8 (eight) hours as needed for nausea or vomiting. 12/27/17   Jene Every, MD  oxyCODONE (OXY IR/ROXICODONE) 5 MG immediate release tablet Take 1 tablet (5 mg total) by mouth every 6 (six) hours as needed for severe pain. 01/08/17 02/07/17  Delano Metz, MD  oxyCODONE (OXY IR/ROXICODONE) 5 MG immediate release tablet Take 1 tablet (5 mg total) by mouth 4 (four) times daily. Max: 4/day 02/07/17 02/14/17  Delano Metz, MD  oxyCODONE (OXY IR/ROXICODONE) 5 MG immediate release tablet Take 1 tablet  (5 mg total) by mouth 3 (three) times daily. Max: 3/day 02/14/17 02/21/17  Delano Metz, MD  oxyCODONE (OXY IR/ROXICODONE) 5 MG immediate release tablet Take 1 tablet (5 mg total) by mouth 2 (two) times daily. Max: 2/day 02/21/17 02/28/17  Delano Metz, MD  oxyCODONE (OXY IR/ROXICODONE) 5 MG immediate release tablet Take 1 tablet (5 mg total) by mouth daily. Max: 1/day 02/28/17 03/07/17  Delano Metz, MD  potassium citrate (UROCIT-K) 10 MEQ (1080 MG) SR tablet 2 (two) times daily.     [provider]  pravastatin (PRAVACHOL) 40 MG tablet Take 40 mg by mouth daily.     [provider]  pregabalin (LYRICA) 75 MG capsule Take 1 capsule (75 mg total) by mouth 3 (three) times daily. 02/05/17  03/07/17  Delano Metz, MD  tamsulosin (FLOMAX) 0.4 MG CAPS capsule Take 0.4 mg by mouth daily.     [provider]  traZODone (DESYREL) 100 MG tablet Take 300 mg by mouth at bedtime.     [provider]  ziprasidone (GEODON) 60 MG capsule Take 60 mg by mouth 2 (two) times daily with a meal.     [provider]      PHYSICAL EXAMINATION:   VITAL SIGNS: Blood pressure (!) 129/98, pulse (!) 102, temperature 98.1 F (36.7 C), temperature source Oral, resp. rate (!) 22, height 5\' 7"  (1.702 m), weight 81.6 kg, SpO2 100 %.  GENERAL:  57 y.o.-year-old patient lying in the bed with no acute distress.  EYES: Pupils equal, round, reactive to light and accommodation. No scleral icterus. Extraocular muscles intact.  HEENT: Head atraumatic, normocephalic. Oropharynx and nasopharynx clear.  NECK:  Supple, no jugular venous distention. No thyroid enlargement, no tenderness.  LUNGS: Rhonchi bilaterally. No use of accessory muscles of respiration.  CARDIOVASCULAR: S1, S2 normal. No murmurs, rubs, or gallops.  ABDOMEN: Soft, +tender, nondistended. Bowel sounds present. No organomegaly or mass.  EXTREMITIES: No pedal edema, cyanosis, or clubbing.  NEUROLOGIC: Cranial  nerves II through XII are intact. Muscle strength 5/5 in all extremities. Sensation intact. Gait not checked.  PSYCHIATRIC: The patient is alert and oriented x 3.  SKIN: No obvious rash, lesion, or ulcer.   LABORATORY PANEL:   CBC Recent Labs  Lab 08/23/18 1610  WBC 15.1*  HGB 17.3*  HCT 52.1*  PLT 376  MCV 93.2  MCH 30.9  MCHC 33.2  RDW 13.8   ------------------------------------------------------------------------------------------------------------------  Chemistries  Recent Labs  Lab 08/23/18 1610  NA 140  K 3.1*  CL 98  CO2 29  GLUCOSE 142*  BUN 20  CREATININE 1.12  CALCIUM 9.8  AST 29  ALT 26  ALKPHOS 64  BILITOT 0.8   ------------------------------------------------------------------------------------------------------------------ estimated creatinine clearance is 74.4 mL/min (by C-G formula based on SCr of 1.12 mg/dL). ------------------------------------------------------------------------------------------------------------------ No results for input(s): TSH, T4TOTAL, T3FREE, THYROIDAB in the last 72 hours.  Invalid input(s): FREET3   Coagulation profile No results for input(s): INR, PROTIME in the last 168 hours. ------------------------------------------------------------------------------------------------------------------- No results for input(s): DDIMER in the last 72 hours. -------------------------------------------------------------------------------------------------------------------  Cardiac Enzymes No results for input(s): CKMB, TROPONINI, MYOGLOBIN in the last 168 hours.  Invalid input(s): CK ------------------------------------------------------------------------------------------------------------------ Invalid input(s): POCBNP  ---------------------------------------------------------------------------------------------------------------  Urinalysis    Component Value Date/Time   COLORURINE AMBER (A) 08/23/2018 1610    APPEARANCEUR CLEAR (A) 08/23/2018 1610   APPEARANCEUR Clear 10/05/2014 1436   LABSPEC 1.024 08/23/2018 1610   LABSPEC 1.009 10/05/2014 1436   PHURINE 7.0 08/23/2018 1610   GLUCOSEU NEGATIVE 08/23/2018 1610   GLUCOSEU Negative 10/05/2014 1436   HGBUR NEGATIVE 08/23/2018 1610   BILIRUBINUR NEGATIVE 08/23/2018 1610   BILIRUBINUR Negative 10/05/2014 1436   KETONESUR 5 (A) 08/23/2018 1610   PROTEINUR 30 (A) 08/23/2018 1610   NITRITE NEGATIVE 08/23/2018 1610   LEUKOCYTESUR NEGATIVE 08/23/2018 1610   LEUKOCYTESUR Negative 10/05/2014 1436     RADIOLOGY: Dg Abdomen 1 View  Result Date: 08/23/2018 CLINICAL DATA:  Nasogastric tube placement. EXAM: ABDOMEN - 1 VIEW COMPARISON:  CT abdomen and pelvis August 23, 2018 FINDINGS: Nasogastric tube tip projects in proximal stomach, side-port at GE junction region. Multiple loops of gas distended small bowel, incompletely evaluated. LEFT lung base atelectasis/scarring. Soft tissue planes and included osseous structures are unchanged. IMPRESSION: Nasogastric tube tip projects in proximal  stomach. Electronically Signed   By: Awilda Metro M.D.   On: 08/23/2018 19:54   Ct Abdomen Pelvis W Contrast  Result Date: 08/23/2018 CLINICAL DATA:  Nausea vomiting and diarrhea EXAM: CT ABDOMEN AND PELVIS WITH CONTRAST TECHNIQUE: Multidetector CT imaging of the abdomen and pelvis was performed using the standard protocol following bolus administration of intravenous contrast. CONTRAST:  ISOVUE-300 IOPAMIDOL (ISOVUE-300) INJECTION 61% COMPARISON:  CT 12/27/2017 FINDINGS: Lower chest: Mild atelectasis or scarring at the lingula. No acute consolidation or effusion. Stable pleural nodularity at the right central lung base. Heart size within normal limits. Fat density at the hiatus with mild mass effect on the IVC. Hepatobiliary: No focal liver abnormality is seen. No gallstones, gallbladder wall thickening, or biliary dilatation. Pancreas: Unremarkable. No pancreatic  ductal dilatation or surrounding inflammatory changes. Spleen: Normal in size without focal abnormality. Adrenals/Urinary Tract: Adrenal glands are normal. No hydronephrosis. Multiple intrarenal stones bilaterally. The largest is seen on the left and measures up to 9 mm in size. There are cysts within the kidneys. The bladder is normal Stomach/Bowel: The stomach is slightly fluid-filled and dilated. Multiple loops of fluid-filled dilated small bowel measuring up to 3.9 cm. Transition points slightly to the right of midline near surgical sutures within the central abdomen. Additional transition to non dilated distal small bowel in the right lower quadrant, possible adhesion. No bowel wall thickening. Sigmoid colon diverticula without acute inflammatory change. Vascular/Lymphatic: Moderate aortic atherosclerosis. No aneurysm. No significantly enlarged lymph nodes. Reproductive: Negative Other: Negative for free air or free fluid. Small fat in the left inguinal canal. Musculoskeletal: No acute or significant osseous findings. IMPRESSION: 1. Multiple loops of dilated fluid-filled small bowel with transition points visualized within the right central abdomen and right lower quadrant, consistent with mechanical small bowel obstruction. Negative for free air or free fluid. 2. Multiple nonobstructing kidney stones 3. Descending and sigmoid colon diverticula without acute inflammatory change. Electronically Signed   By: Jasmine Pang M.D.   On: 08/23/2018 18:40    EKG: Orders placed or performed during the hospital encounter of 10/24/15  . EKG 12-Lead  . EKG 12-Lead    IMPRESSION AND PLAN: *Acute recurrent small bowel obstruction with transition point *Acute hypokalemia *Mild COPD exacerbation *Chronic tobacco smoker abuse/dependency *Chronic benign essential hypertension *Chronic bipolar illness *Congestive heart failure without exacerbation *History of DVT  Admit to regular nursing for bed, general  surgery consulted in the emergency room/Dr. Piscoya for expert opinion, continue NG tube for decompression, antiemetics PRN, adult pain protocol, gentle IV fluids for rehydration, replete potassium, check magnesium level, BMP in the morning, inhaled corticosteroids twice daily, aggressive pulmonary toilet with bronchodilator therapy, wean O2 off as tolerated, follow-up on chest x-ray, tobacco cessation counseling ordered, nicotine patch daily, continue home psychotropic regiment and antihypertensives, hold Coumadin for now in the event patient may require operative intervention-check PT/INR now as well as every a.m., and continue close medical monitoring     All the records are reviewed and case discussed with ED provider. Management plans discussed with the patient, family and they are in agreement.  CODE STATUS:full    TOTAL TIME TAKING CARE OF THIS PATIENT: 45 minutes.    Evelena Asa Jomel Whittlesey M.D on 08/23/2018   Between 7am to 6pm - Pager - [phone]  After 6pm go to [email or web address] - password [email or web address]  [facility]  Office  [phone]  CC: Primary care physician; Dortha Kern, MD   Note: This dictation was prepared with Reubin Milan  dictation along with smaller phrase technology. Any transcriptional errors that result from this process are unintentional.

## 2018-08-23 NOTE — ED Notes (Signed)
Pt reporting increased dyspnea. Hypoxia noted on monitor. Pts oxygen saturation dropped to 86%. Pt denies wearing home oxygen. Pt placed on 2L.

## 2018-08-23 NOTE — Progress Notes (Signed)
Family Meeting Note  Advance Directive:yes  Today a meeting took place with the Patient.  Patient is able to participate   The following clinical team members were present during this meeting:MD  The following were discussed:Patient's diagnosis: sbo, Patient's progosis: Unable to determine and Goals for treatment: Full Code  Additional follow-up to be provided: prn  Time spent during discussion:20 minutes  Baruch Lewers D Waqas Bruhl, MD  

## 2018-08-23 NOTE — ED Triage Notes (Signed)
Nausea, vomiting and diarrhea x 3 days

## 2018-08-23 NOTE — ED Notes (Signed)
51fr ng tube inserted into left nares.  Pt tolerated without diff.  Connected to low suction.  approx 400cc of light green emesis.  Family in with pt.  Pt waiting on admission.

## 2018-08-24 DIAGNOSIS — K56609 Unspecified intestinal obstruction, unspecified as to partial versus complete obstruction: Secondary | ICD-10-CM

## 2018-08-24 LAB — CBC
HEMATOCRIT: 43.7 % (ref 39.0–52.0)
Hemoglobin: 14.1 g/dL (ref 13.0–17.0)
MCH: 31.1 pg (ref 26.0–34.0)
MCHC: 32.3 g/dL (ref 30.0–36.0)
MCV: 96.3 fL (ref 80.0–100.0)
Platelets: 286 10*3/uL (ref 150–400)
RBC: 4.54 MIL/uL (ref 4.22–5.81)
RDW: 14.1 % (ref 11.5–15.5)
WBC: 14 10*3/uL — ABNORMAL HIGH (ref 4.0–10.5)
nRBC: 0 % (ref 0.0–0.2)

## 2018-08-24 LAB — BASIC METABOLIC PANEL
Anion gap: 8 (ref 5–15)
BUN: 19 mg/dL (ref 6–20)
CHLORIDE: 103 mmol/L (ref 98–111)
CO2: 30 mmol/L (ref 22–32)
Calcium: 8.3 mg/dL — ABNORMAL LOW (ref 8.9–10.3)
Creatinine, Ser: 0.99 mg/dL (ref 0.61–1.24)
GFR calc Af Amer: >60 mL/min (ref >60)
GFR calc non Af Amer: >60 mL/min (ref >60)
Glucose, Bld: 116 mg/dL — ABNORMAL HIGH (ref 70–99)
POTASSIUM: 3.2 mmol/L — AB (ref 3.5–5.1)
Sodium: 141 mmol/L (ref 135–145)

## 2018-08-24 LAB — PROTIME-INR
INR: 1.05
Prothrombin Time: 13.6 seconds (ref 11.4–15.2)

## 2018-08-24 MED ORDER — METOPROLOL SUCCINATE ER 25 MG PO TB24
12.5000 mg | ORAL_TABLET | Freq: Every day | ORAL | Status: DC
  Administered 2018-08-24 – 2018-08-29 (×5): 12.5 mg via ORAL
  Filled 2018-08-24 (×5): qty 1

## 2018-08-24 MED ORDER — LACTATED RINGERS IV BOLUS
1000.0000 mL | Freq: Once | INTRAVENOUS | Status: AC
  Administered 2018-08-24: 1000 mL via INTRAVENOUS

## 2018-08-24 MED ORDER — POTASSIUM CHLORIDE 10 MEQ/100ML IV SOLN
10.0000 meq | INTRAVENOUS | Status: AC
  Administered 2018-08-24 (×2): 10 meq via INTRAVENOUS
  Filled 2018-08-24 (×2): qty 100

## 2018-08-24 MED ORDER — POTASSIUM CHLORIDE 10 MEQ/100ML IV SOLN
10.0000 meq | Freq: Once | INTRAVENOUS | Status: AC
  Administered 2018-08-24: 10 meq via INTRAVENOUS
  Filled 2018-08-24: qty 100

## 2018-08-24 MED ORDER — HEPARIN SODIUM (PORCINE) 5000 UNIT/ML IJ SOLN
5000.0000 [IU] | Freq: Three times a day (TID) | INTRAMUSCULAR | Status: DC
  Administered 2018-08-24 – 2018-08-29 (×15): 5000 [IU] via SUBCUTANEOUS
  Filled 2018-08-24 (×15): qty 1

## 2018-08-24 NOTE — Progress Notes (Signed)
[facility] - [place] at [facility]   PATIENT NAME: Brett Fernandez    MR#:  [id number]  DATE OF BIRTH:  05-Sep-1961  SUBJECTIVE:   Says abdominal pain has improved.  No longer having any nausea or vomiting.  Still having some mild shortness of breath, cough, wheezing.  REVIEW OF SYSTEMS:  Review of Systems  Constitutional: Negative for chills and fever.  HENT: Negative for congestion and sore throat.   Eyes: Negative for blurred vision and double vision.  Respiratory: Positive for cough, shortness of breath and wheezing.   Cardiovascular: Negative for chest pain, palpitations and leg swelling.  Gastrointestinal: Positive for abdominal pain. Negative for nausea and vomiting.  Genitourinary: Negative for dysuria and urgency.  Musculoskeletal: Negative for back pain and neck pain.  Neurological: Negative for dizziness and headaches.  Psychiatric/Behavioral: Negative for depression. The patient is not nervous/anxious.     DRUG ALLERGIES:   Allergies  Allergen Reactions  . Aspirin Other (See Comments)    Other Reaction: steven-johnson reaction [name]  . Bee Venom Anaphylaxis  . Sulfa Antibiotics Swelling   VITALS:  Blood pressure 105/63, pulse 99, temperature 98.4 F (36.9 C), temperature source Oral, resp. rate 16, height 5\' 7"  (1.702 m), weight 77.3 kg, SpO2 93 %. PHYSICAL EXAMINATION:  Physical Exam  GENERAL:  57 y.o.-year-old patient lying in the bed with no acute distress.  EYES: Pupils equal, round, reactive to light and accommodation. No scleral icterus. Extraocular muscles intact.  HEENT: Head atraumatic, normocephalic. Oropharynx and nasopharynx clear. + NG tube in place. NECK:  Supple, no jugular venous distention. No thyroid enlargement, no tenderness.  LUNGS: + Diffuse rhonchi present throughout all lung fields. No use of accessory muscles of respiration.  Nasal cannula in place. CARDIOVASCULAR: RRR, S1, S2 normal. No murmurs,  rubs, or gallops.  ABDOMEN: Soft, + generalized tenderness to palpation, nondistended. Bowel sounds present. No organomegaly or mass.  EXTREMITIES: No pedal edema, cyanosis, or clubbing.  NEUROLOGIC: Cranial nerves II through XII are intact. Muscle strength 5/5 in all extremities. Sensation intact. Gait not checked.  PSYCHIATRIC: The patient is alert and oriented x 3.  SKIN: No obvious rash, lesion, or ulcer.  LABORATORY PANEL:  Male CBC Recent Labs  Lab 08/24/18 0504  WBC 14.0*  HGB 14.1  HCT 43.7  PLT 286   ------------------------------------------------------------------------------------------------------------------ Chemistries  Recent Labs  Lab 08/23/18 1610 08/23/18 2151 08/24/18 0504  NA 140  --  141  K 3.1*  --  3.2*  CL 98  --  103  CO2 29  --  30  GLUCOSE 142*  --  116*  BUN 20  --  19  CREATININE 1.12  --  0.99  CALCIUM 9.8  --  8.3*  MG  --  2.0  --   AST 29  --   --   ALT 26  --   --   ALKPHOS 64  --   --   BILITOT 0.8  --   --    RADIOLOGY:  Dg Chest 1 View  Result Date: 08/23/2018 CLINICAL DATA:  Hypoxia EXAM: CHEST  1 VIEW COMPARISON:  01/03/2016 FINDINGS: Esophageal tube tip is in the left upper quadrant. Atelectasis at the left base. No acute consolidation or effusion. Normal heart size. No pneumothorax. Apical blebs. IMPRESSION: Mild subsegmental atelectasis at the left base. Electronically Signed   By: 01/05/2016 M.D.   On: 08/23/2018 20:24   Dg Abdomen 1 View  Result Date: 08/23/2018  CLINICAL DATA:  Nasogastric tube placement. EXAM: ABDOMEN - 1 VIEW COMPARISON:  CT abdomen and pelvis August 23, 2018 FINDINGS: Nasogastric tube tip projects in proximal stomach, side-port at GE junction region. Multiple loops of gas distended small bowel, incompletely evaluated. LEFT lung base atelectasis/scarring. Soft tissue planes and included osseous structures are unchanged. IMPRESSION: Nasogastric tube tip projects in proximal stomach. Electronically Signed    By: Awilda Metro M.D.   On: 08/23/2018 19:54   Ct Abdomen Pelvis W Contrast  Result Date: 08/23/2018 CLINICAL DATA:  Nausea vomiting and diarrhea EXAM: CT ABDOMEN AND PELVIS WITH CONTRAST TECHNIQUE: Multidetector CT imaging of the abdomen and pelvis was performed using the standard protocol following bolus administration of intravenous contrast. CONTRAST:  ISOVUE-300 IOPAMIDOL (ISOVUE-300) INJECTION 61% COMPARISON:  CT 12/27/2017 FINDINGS: Lower chest: Mild atelectasis or scarring at the lingula. No acute consolidation or effusion. Stable pleural nodularity at the right central lung base. Heart size within normal limits. Fat density at the hiatus with mild mass effect on the IVC. Hepatobiliary: No focal liver abnormality is seen. No gallstones, gallbladder wall thickening, or biliary dilatation. Pancreas: Unremarkable. No pancreatic ductal dilatation or surrounding inflammatory changes. Spleen: Normal in size without focal abnormality. Adrenals/Urinary Tract: Adrenal glands are normal. No hydronephrosis. Multiple intrarenal stones bilaterally. The largest is seen on the left and measures up to 9 mm in size. There are cysts within the kidneys. The bladder is normal Stomach/Bowel: The stomach is slightly fluid-filled and dilated. Multiple loops of fluid-filled dilated small bowel measuring up to 3.9 cm. Transition points slightly to the right of midline near surgical sutures within the central abdomen. Additional transition to non dilated distal small bowel in the right lower quadrant, possible adhesion. No bowel wall thickening. Sigmoid colon diverticula without acute inflammatory change. Vascular/Lymphatic: Moderate aortic atherosclerosis. No aneurysm. No significantly enlarged lymph nodes. Reproductive: Negative Other: Negative for free air or free fluid. Small fat in the left inguinal canal. Musculoskeletal: No acute or significant osseous findings. IMPRESSION: 1. Multiple loops of dilated  fluid-filled small bowel with transition points visualized within the right central abdomen and right lower quadrant, consistent with mechanical small bowel obstruction. Negative for free air or free fluid. 2. Multiple nonobstructing kidney stones 3. Descending and sigmoid colon diverticula without acute inflammatory change. Electronically Signed   By: Jasmine Pang M.D.   On: 08/23/2018 18:40   ASSESSMENT AND PLAN:   Acute recurrent small bowel obstruction with transition point-nausea, vomiting, and abdominal pain are improving. -Continue NG tube -Surgery following -IV antiemetics and IV pain meds -Continue IV fluids  Acute hypokalemia-K 3.2, mag 2.0 -Replete -Recheck in the morning  Mild COPD exacerbation- diffuse rhonchi present on exam.  Requiring 2 L O2. -Continue duo nebs and albuterol -Continue home inhalers -Wean O2 as able  Hypertension- BPs soft -Decrease metoprolol from 50 mg daily to 12.5 mg daily -Hydralazine PRN  Chronic diastolic heart failure-no signs of acute exacerbation -Continue metoprolol at decreased dose  Tobacco abuse-current smoker -Nicotine patch -Tobacco cessation counseling provided this admission  Bipolar disorder-stable -Continue Cymbalta, Klonopin, Remeron, Geodon  History of DVT- INR 1.05 -Holding Coumadin in case of surgery -DVT prophylaxis with heparin  All the records are reviewed and case discussed with Care Management/Social Worker. Management plans discussed with the patient, family and they are in agreement.  CODE STATUS: Full Code  TOTAL TIME TAKING CARE OF THIS PATIENT: 40 minutes.   More than 50% of the time was spent in counseling/coordination of care: YES  POSSIBLE D/C IN 3-4 DAYS, DEPENDING ON CLINICAL CONDITION.   Jinny Blossom Starlyn Droge M.D on 08/24/2018 at 1:57 PM  Between 7am to 6pm - Pager - [phone]  After 6pm go to [email or web address] - Social research officer, government  [facility]  Office  [phone]  CC:  Primary care physician; Dortha Kern, MD  Note: This dictation was prepared with Dragon dictation along with smaller phrase technology. Any transcriptional errors that result from this process are unintentional.

## 2018-08-24 NOTE — Consult Note (Signed)
[facility] Consult Note  Brett Fernandez [birth year]  [id number].    Requesting MD: Dr Jene Every, MD Chief Complaint/Reason for Consult: Small Bowel Obstruction  HPI:  Brett Fernandez is a 57 y.o. male who presented to [facility] ED on 08/23/18 with complaints of abdominal pain. He reported about 3 days of worsening upper abdominal pain with associated nausea and emesis. He has a history of small bowel obstructions in the past, he believes his last one may have been a few years ago and he believes Dr Renda Rolls preformed his last procedure.  This feels similar to his previous presentations for SBO but less severe. He denied any associated fever, chills, chest pain, SOB, or urinary changes. Nothing seemed to make the symptoms better. He does report a few stools last being yesterday. Of note his abdominal surgeries are significant for appendectomy, hernia repair, and exploratory laparotomy for SBO.  This morning, he reports that his is not feeling much better. He endorses diffuse abdominal pain and persistent nausea despite NGT being placed.    ROS: Review of Systems  Constitutional: Negative for chills and fever.  Respiratory: Negative for cough and shortness of breath.   Cardiovascular: Negative for chest pain and leg swelling.  Gastrointestinal: Positive for abdominal pain, nausea and vomiting. Negative for blood in stool, constipation and diarrhea.  Genitourinary: Negative for dysuria and urgency.  All other systems reviewed and are negative.   Family History  Problem Relation Age of Onset  . Depression Mother   . Diabetes Mother   . Heart disease Mother   . COPD Mother   . Heart disease Father   . Cancer Father     Past Medical History:  Diagnosis Date  . Allergy   . Anginal pain (HCC)   . Bipolar disorder (HCC)   . BPH (benign prostatic hypertrophy)   . CHF (congestive heart failure) (HCC)   . COPD (chronic obstructive pulmonary disease) (HCC)   . COPD  exacerbation (HCC) 11/02/2015  . Coronary artery disease   . Deep vein thrombosis (DVT) (HCC)   . DJD (degenerative joint disease)   . Gastric ulcer   . GERD (gastroesophageal reflux disease)   . History of hiatal hernia   . Hypertension   . Hypothyroidism   . Kidney stones   . Lethargy 11/02/2015  . Neuromuscular disorder (HCC)   . PTSD (post-traumatic stress disorder)     Past Surgical History:  Procedure Laterality Date  . APPENDECTOMY    . colon polyps    . HEMORRHOIDECTOMY WITH HEMORRHOID BANDING    . HERNIA REPAIR    . INNER EAR SURGERY    . JOINT REPLACEMENT Left   . TOTAL KNEE ARTHROPLASTY Right 11/01/2015   Procedure: TOTAL KNEE ARTHROPLASTY;  Surgeon: Juanell Fairly, MD;  Location: [facility] ORS;  Service: Orthopedics;  Laterality: Right;    Social History:  reports that he has been smoking cigarettes. He has been smoking about 0.50 packs per day. He has never used smokeless tobacco. He reports that he does not drink alcohol or use drugs.  Allergies:  Allergies  Allergen Reactions  . Aspirin Other (See Comments)    Other Reaction: steven-johnson reaction [name]  . Bee Venom Anaphylaxis  . Sulfa Antibiotics Swelling    Medications Prior to Admission  Medication Sig Dispense Refill  . atomoxetine (STRATTERA) 40 MG capsule Take 80 mg by mouth daily.    . DULoxetine (CYMBALTA) 60 MG capsule Take 120 mg by mouth  daily.     . finasteride (PROSCAR) 5 MG tablet Take 5 mg by mouth daily.     . fluticasone (FLONASE) 50 MCG/ACT nasal spray Place 2 sprays into both nostrils daily.     Marland Kitchen levothyroxine (SYNTHROID, LEVOTHROID) 50 MCG tablet Take 50 mcg by mouth daily before breakfast.     . loratadine (CLARITIN) 10 MG tablet Take 10 mg by mouth daily.     . metoprolol succinate (TOPROL-XL) 50 MG 24 hr tablet Take 50 mg by mouth daily.     . mirtazapine (REMERON) 15 MG tablet Take 15 mg by mouth daily.    . nitroGLYCERIN (NITROSTAT) 0.4 MG SL tablet Place 0.4 mg  under the tongue every 5 (five) minutes as needed for chest pain.     Marland Kitchen omeprazole (PRILOSEC) 40 MG capsule Take 40 mg by mouth daily.     . potassium citrate (UROCIT-K) 10 MEQ (1080 MG) SR tablet Take 20 mEq by mouth 2 (two) times daily.     . pravastatin (PRAVACHOL) 80 MG tablet Take 80 mg by mouth at bedtime.     . pregabalin (LYRICA) 75 MG capsule Take 150 mg by mouth 2 (two) times daily.    . tamsulosin (FLOMAX) 0.4 MG CAPS capsule Take 0.4 mg by mouth daily.     . traZODone (DESYREL) 100 MG tablet Take 300 mg by mouth at bedtime.     Marland Kitchen warfarin (COUMADIN) 4 MG tablet Take 4 mg by mouth every evening.     . ziprasidone (GEODON) 60 MG capsule Take 60 mg by mouth 2 (two) times daily with a meal.     . isosorbide mononitrate (IMDUR) 30 MG 24 hr tablet Take 30 mg by mouth daily.     Marland Kitchen oxyCODONE (OXY IR/ROXICODONE) 5 MG immediate release tablet Take 1 tablet (5 mg total) by mouth every 6 (six) hours as needed for severe pain. 120 tablet 0  . oxyCODONE (OXY IR/ROXICODONE) 5 MG immediate release tablet Take 1 tablet (5 mg total) by mouth 4 (four) times daily. Max: 4/day 28 tablet 0  . oxyCODONE (OXY IR/ROXICODONE) 5 MG immediate release tablet Take 1 tablet (5 mg total) by mouth 3 (three) times daily. Max: 3/day 21 tablet 0  . oxyCODONE (OXY IR/ROXICODONE) 5 MG immediate release tablet Take 1 tablet (5 mg total) by mouth 2 (two) times daily. Max: 2/day 14 tablet 0  . oxyCODONE (OXY IR/ROXICODONE) 5 MG immediate release tablet Take 1 tablet (5 mg total) by mouth daily. Max: 1/day 7 tablet 0  . pregabalin (LYRICA) 75 MG capsule Take 1 capsule (75 mg total) by mouth 3 (three) times daily. 90 capsule 0    Blood pressure 100/67, pulse 96, temperature 98.5 F (36.9 C), resp. rate 16, height 5\' 7"  (1.702 m), weight 77.3 kg, SpO2 98 %.   Physical Exam: Physical Exam  Constitutional: He is oriented to person, place, and time. He appears well-developed and well-nourished. No distress.  HENT:  Head:  Normocephalic and atraumatic.  NGT in place  Eyes: No scleral icterus.  Cardiovascular: Normal rate and regular rhythm. Exam reveals no gallop.  No murmur heard. Pulmonary/Chest: Effort normal and breath sounds normal. No respiratory distress. He has no wheezes. He has no rhonchi. He has no rales.  Abdominal: Soft. Normal appearance. There is tenderness in the right lower quadrant, periumbilical area and suprapubic area. There is no rigidity, no rebound and no guarding.  Genitourinary:  Genitourinary Comments: Deferred  Neurological: He is alert and  oriented to person, place, and time.  Skin: Skin is warm and dry. No rash noted. He is not diaphoretic. No erythema.  Psychiatric: He has a normal mood and affect.    Results for orders placed or performed during the hospital encounter of 08/23/18 (from the past 48 hour(s))  Lipase, blood     Status: Abnormal   Collection Time: 08/23/18  4:10 PM  Result Value Ref Range   Lipase 72 (H) 11 - 51 U/L    Comment: Performed at [facility], [address] [place], [place] [address]  Comprehensive metabolic panel     Status: Abnormal   Collection Time: 08/23/18  4:10 PM  Result Value Ref Range   Sodium 140 135 - 145 mmol/L   Potassium 3.1 (L) 3.5 - 5.1 mmol/L   Chloride 98 98 - 111 mmol/L   CO2 29 22 - 32 mmol/L   Glucose, Bld 142 (H) 70 - 99 mg/dL   BUN 20 6 - 20 mg/dL   Creatinine, Ser 0.17 0.61 - 1.24 mg/dL   Calcium 9.8 8.9 - 51.0 mg/dL   Total Protein 7.6 6.5 - 8.1 g/dL   Albumin 4.2 3.5 - 5.0 g/dL   AST 29 15 - 41 U/L   ALT 26 0 - 44 U/L   Alkaline Phosphatase 64 38 - 126 U/L   Total Bilirubin 0.8 0.3 - 1.2 mg/dL   GFR calc non Af Amer >60 >60 mL/min   GFR calc Af Amer >60 >60 mL/min   Anion gap 13 5 - 15    Comment: Performed at [facility], [address] [place], [place] [address]  CBC     Status: Abnormal   Collection Time: 08/23/18  4:10 PM  Result Value Ref Range   WBC 15.1 (H) 4.0 - 10.5 K/uL   RBC  5.59 4.22 - 5.81 MIL/uL   Hemoglobin 17.3 (H) 13.0 - 17.0 g/dL   HCT 77.8 (H) 24.2 - 35.3 %   MCV 93.2 80.0 - 100.0 fL   MCH 30.9 26.0 - 34.0 pg   MCHC 33.2 30.0 - 36.0 g/dL   RDW 61.4 43.1 - 54.0 %   Platelets 376 150 - 400 K/uL   nRBC 0.0 0.0 - 0.2 %    Comment: Performed at [facility], [address] [place], [place] [address]  Urinalysis, Complete w Microscopic     Status: Abnormal   Collection Time: 08/23/18  4:10 PM  Result Value Ref Range   Color, Urine AMBER (A) YELLOW    Comment: BIOCHEMICALS MAY BE AFFECTED BY COLOR   APPearance CLEAR (A) CLEAR   Specific Gravity, Urine 1.024 1.005 - 1.030   pH 7.0 5.0 - 8.0   Glucose, UA NEGATIVE NEGATIVE mg/dL   Hgb urine dipstick NEGATIVE NEGATIVE   Bilirubin Urine NEGATIVE NEGATIVE   Ketones, ur 5 (A) NEGATIVE mg/dL   Protein, ur 30 (A) NEGATIVE mg/dL   Nitrite NEGATIVE NEGATIVE   Leukocytes, UA NEGATIVE NEGATIVE   RBC / HPF 0-5 0 - 5 RBC/hpf   WBC, UA 0-5 0 - 5 WBC/hpf   Bacteria, UA NONE SEEN NONE SEEN   Squamous Epithelial / LPF 0-5 0 - 5   Mucus PRESENT    Hyaline Casts, UA PRESENT     Comment: Performed at [facility], [address], [place], [place] [address]  Protime-INR     Status: None   Collection Time: 08/23/18  9:51 PM  Result Value Ref Range  Prothrombin Time 13.2 11.4 - 15.2 seconds   INR 1.01     Comment: Performed at [facility], [address] [place], [place] [address]  Magnesium     Status: None   Collection Time: 08/23/18  9:51 PM  Result Value Ref Range   Magnesium 2.0 1.7 - 2.4 mg/dL    Comment: Performed at [facility], [address] [place], [place] [address]  Basic metabolic panel     Status: Abnormal   Collection Time: 08/24/18  5:04 AM  Result Value Ref Range   Sodium 141 135 - 145 mmol/L   Potassium 3.2 (L) 3.5 - 5.1 mmol/L   Chloride 103 98 - 111 mmol/L   CO2 30 22 - 32 mmol/L   Glucose, Bld 116 (H) 70 - 99 mg/dL   BUN 19 6 - 20 mg/dL    Creatinine, Ser 0.98 0.61 - 1.24 mg/dL   Calcium 8.3 (L) 8.9 - 10.3 mg/dL   GFR calc non Af Amer >60 >60 mL/min   GFR calc Af Amer >60 >60 mL/min   Anion gap 8 5 - 15    Comment: Performed at [facility], [address] [place], [place] [address]  CBC     Status: Abnormal   Collection Time: 08/24/18  5:04 AM  Result Value Ref Range   WBC 14.0 (H) 4.0 - 10.5 K/uL   RBC 4.54 4.22 - 5.81 MIL/uL   Hemoglobin 14.1 13.0 - 17.0 g/dL   HCT 78.2 95.6 - 21.3 %   MCV 96.3 80.0 - 100.0 fL   MCH 31.1 26.0 - 34.0 pg   MCHC 32.3 30.0 - 36.0 g/dL   RDW 08.6 57.8 - 46.9 %   Platelets 286 150 - 400 K/uL   nRBC 0.0 0.0 - 0.2 %    Comment: Performed at [facility], [address] [place], [place] [address]  Protime-INR     Status: None   Collection Time: 08/24/18  5:04 AM  Result Value Ref Range   Prothrombin Time 13.6 11.4 - 15.2 seconds   INR 1.05     Comment: Performed at [facility], [address], [place], [place] [address]   Dg Chest 1 View  Result Date: 08/23/2018 CLINICAL DATA:  Hypoxia EXAM: CHEST  1 VIEW COMPARISON:  01/03/2016 FINDINGS: Esophageal tube tip is in the left upper quadrant. Atelectasis at the left base. No acute consolidation or effusion. Normal heart size. No pneumothorax. Apical blebs. IMPRESSION: Mild subsegmental atelectasis at the left base. Electronically Signed   By: Jasmine Pang M.D.   On: 08/23/2018 20:24   Dg Abdomen 1 View  Result Date: 08/23/2018 CLINICAL DATA:  Nasogastric tube placement. EXAM: ABDOMEN - 1 VIEW COMPARISON:  CT abdomen and pelvis August 23, 2018 FINDINGS: Nasogastric tube tip projects in proximal stomach, side-port at GE junction region. Multiple loops of gas distended small bowel, incompletely evaluated. LEFT lung base atelectasis/scarring. Soft tissue planes and included osseous structures are unchanged. IMPRESSION: Nasogastric tube tip projects in proximal stomach. Electronically Signed   By: Awilda Metro  M.D.   On: 08/23/2018 19:54   Ct Abdomen Pelvis W Contrast  Result Date: 08/23/2018 CLINICAL DATA:  Nausea vomiting and diarrhea EXAM: CT ABDOMEN AND PELVIS WITH CONTRAST TECHNIQUE: Multidetector CT imaging of the abdomen and pelvis was performed using the standard protocol following bolus administration of intravenous contrast. CONTRAST:  ISOVUE-300 IOPAMIDOL (ISOVUE-300) INJECTION 61% COMPARISON:  CT 12/27/2017 FINDINGS: Lower chest: Mild atelectasis  or scarring at the lingula. No acute consolidation or effusion. Stable pleural nodularity at the right central lung base. Heart size within normal limits. Fat density at the hiatus with mild mass effect on the IVC. Hepatobiliary: No focal liver abnormality is seen. No gallstones, gallbladder wall thickening, or biliary dilatation. Pancreas: Unremarkable. No pancreatic ductal dilatation or surrounding inflammatory changes. Spleen: Normal in size without focal abnormality. Adrenals/Urinary Tract: Adrenal glands are normal. No hydronephrosis. Multiple intrarenal stones bilaterally. The largest is seen on the left and measures up to 9 mm in size. There are cysts within the kidneys. The bladder is normal Stomach/Bowel: The stomach is slightly fluid-filled and dilated. Multiple loops of fluid-filled dilated small bowel measuring up to 3.9 cm. Transition points slightly to the right of midline near surgical sutures within the central abdomen. Additional transition to non dilated distal small bowel in the right lower quadrant, possible adhesion. No bowel wall thickening. Sigmoid colon diverticula without acute inflammatory change. Vascular/Lymphatic: Moderate aortic atherosclerosis. No aneurysm. No significantly enlarged lymph nodes. Reproductive: Negative Other: Negative for free air or free fluid. Small fat in the left inguinal canal. Musculoskeletal: No acute or significant osseous findings. IMPRESSION: 1. Multiple loops of dilated fluid-filled small bowel with  transition points visualized within the right central abdomen and right lower quadrant, consistent with mechanical small bowel obstruction. Negative for free air or free fluid. 2. Multiple nonobstructing kidney stones 3. Descending and sigmoid colon diverticula without acute inflammatory change. Electronically Signed   By: Jasmine Pang M.D.   On: 08/23/2018 18:40      Assessment/Plan  Small Bowel Obstruction  ARDA KEADLE is a 57 y.o. male with recurrent small bowel obstruction which is complicated by bipolar, CHF, COPD, CAD, history of DVT, HTN, Hypothyroidism, and tobacco abuse (smoking)    - NGT to low intermittent suction   - NPO + IVF   - Continue to monitor abdominal exam and ongoing bowel function   - Will continue to monitor. Discussed the possibility that is the patient does not improve we will need to consider surgical intervention which he understands.    - Mobilize as tolerates   - Medical management of comorbidities.   -- Lynden Oxford, PA-C [facility] 08/24/2018, 7:22 AM [phone] M-F: 7am - 4pm

## 2018-08-25 ENCOUNTER — Inpatient Hospital Stay: Payer: Medicaid Other

## 2018-08-25 LAB — BASIC METABOLIC PANEL
Anion gap: 7 (ref 5–15)
BUN: 13 mg/dL (ref 6–20)
CALCIUM: 8.2 mg/dL — AB (ref 8.9–10.3)
CO2: 30 mmol/L (ref 22–32)
Chloride: 101 mmol/L (ref 98–111)
Creatinine, Ser: 1.03 mg/dL (ref 0.61–1.24)
GFR calc Af Amer: >60 mL/min (ref >60)
GFR calc non Af Amer: >60 mL/min (ref >60)
Glucose, Bld: 111 mg/dL — ABNORMAL HIGH (ref 70–99)
Potassium: 3.9 mmol/L (ref 3.5–5.1)
Sodium: 138 mmol/L (ref 135–145)

## 2018-08-25 LAB — CBC
HCT: 43.4 % (ref 39.0–52.0)
HEMOGLOBIN: 13.7 g/dL (ref 13.0–17.0)
MCH: 31.5 pg (ref 26.0–34.0)
MCHC: 31.6 g/dL (ref 30.0–36.0)
MCV: 99.8 fL (ref 80.0–100.0)
PLATELETS: 245 10*3/uL (ref 150–400)
RBC: 4.35 MIL/uL (ref 4.22–5.81)
RDW: 13.7 % (ref 11.5–15.5)
WBC: 11.7 10*3/uL — ABNORMAL HIGH (ref 4.0–10.5)
nRBC: 0 % (ref 0.0–0.2)

## 2018-08-25 LAB — HIV ANTIBODY (ROUTINE TESTING W REFLEX): HIV SCREEN 4TH GENERATION: NONREACTIVE

## 2018-08-25 LAB — PROTIME-INR
INR: 0.97
Prothrombin Time: 12.8 seconds (ref 11.4–15.2)

## 2018-08-25 MED ORDER — MENTHOL 3 MG MT LOZG
1.0000 | LOZENGE | OROMUCOSAL | Status: DC | PRN
  Administered 2018-08-25 – 2018-08-26 (×2): 3 mg via ORAL
  Filled 2018-08-25: qty 9

## 2018-08-25 NOTE — Progress Notes (Signed)
[facility] Progress Note     Subjective: No acute events overnight. This morning, he reports he is still having abdominal soreness in his umbilical and epigastric region which is exacerbated with coughing. No complaints of nausea or emesis. He does report that he had a loose bowel movement this morning.   NGT with 850 ccs out in last 24 hours per chart review.   Objective: Vital signs in last 24 hours: Temp:  [97.6 F (36.4 C)-98.4 F (36.9 C)] 98.4 F (36.9 C) (12/11 0800) Pulse Rate:  [91-103] 96 (12/11 0800) Resp:  [17-18] 18 (12/11 0800) BP: (105-119)/(63-82) 113/79 (12/11 0800) SpO2:  [93 %-98 %] 94 % (12/11 0800) Weight:  [69.4 kg] 69.4 kg (12/11 0500) Last BM Date: 08/23/18(per patient report)  Intake/Output from previous day: 12/10 0701 - 12/11 0700 In: 1072.9 [I.V.:972.9; IV Piggyback:100] Out: 1150 [Urine:300; Emesis/NG output:850] Intake/Output this shift: No intake/output data recorded.  PE: Gen:  Alert, NAD, pleasant HEENT: NGT in place Card:   Pulm:  Normal effort Abd: Soft, tenderness periumbilical, non-distended. Previous well healed midline laparotomy incision Skin: warm and dry, no rashes  Psych: A&Ox3   Lab Results:  Recent Labs    08/24/18 0504 08/25/18 0447  WBC 14.0* 11.7*  HGB 14.1 13.7  HCT 43.7 43.4  PLT 286 245   BMET Recent Labs    08/24/18 0504 08/25/18 0447  NA 141 138  K 3.2* 3.9  CL 103 101  CO2 30 30  GLUCOSE 116* 111*  BUN 19 13  CREATININE 0.99 1.03  CALCIUM 8.3* 8.2*   PT/INR Recent Labs    08/24/18 0504 08/25/18 0447  LABPROT 13.6 12.8  INR 1.05 0.97   CMP     Component Value Date/Time   NA 138 08/25/2018 0447   NA 141 10/13/2014 0518   K 3.9 08/25/2018 0447   K 4.1 10/13/2014 0518   CL 101 08/25/2018 0447   CL 109 (H) 10/13/2014 0518   CO2 30 08/25/2018 0447   CO2 27 10/13/2014 0518   GLUCOSE 111 (H) 08/25/2018 0447   GLUCOSE 125 (H) 10/13/2014 0518   BUN 13 08/25/2018 0447   BUN  8 10/13/2014 0518   CREATININE 1.03 08/25/2018 0447   CREATININE 1.04 10/13/2014 0518   CALCIUM 8.2 (L) 08/25/2018 0447   CALCIUM 8.3 (L) 10/13/2014 0518   PROT 7.6 08/23/2018 1610   ALBUMIN 4.2 08/23/2018 1610   AST 29 08/23/2018 1610   ALT 26 08/23/2018 1610   ALKPHOS 64 08/23/2018 1610   BILITOT 0.8 08/23/2018 1610   GFRNONAA >60 08/25/2018 0447   GFRNONAA >60 10/13/2014 0518   GFRNONAA >60 02/20/2012 1257   GFRAA >60 08/25/2018 0447   GFRAA >60 10/13/2014 0518   GFRAA >60 02/20/2012 1257   Lipase     Component Value Date/Time   LIPASE 72 (H) 08/23/2018 1610       Studies/Results: Dg Chest 1 View  Result Date: 08/23/2018 CLINICAL DATA:  Hypoxia EXAM: CHEST  1 VIEW COMPARISON:  01/03/2016 FINDINGS: Esophageal tube tip is in the left upper quadrant. Atelectasis at the left base. No acute consolidation or effusion. Normal heart size. No pneumothorax. Apical blebs. IMPRESSION: Mild subsegmental atelectasis at the left base. Electronically Signed   By: Jasmine Pang M.D.   On: 08/23/2018 20:24   Dg Abdomen 1 View  Result Date: 08/23/2018 CLINICAL DATA:  Nasogastric tube placement. EXAM: ABDOMEN - 1 VIEW COMPARISON:  CT abdomen and pelvis August 23, 2018 FINDINGS: Nasogastric  tube tip projects in proximal stomach, side-port at GE junction region. Multiple loops of gas distended small bowel, incompletely evaluated. LEFT lung base atelectasis/scarring. Soft tissue planes and included osseous structures are unchanged. IMPRESSION: Nasogastric tube tip projects in proximal stomach. Electronically Signed   By: Awilda Metro M.D.   On: 08/23/2018 19:54   Ct Abdomen Pelvis W Contrast  Result Date: 08/23/2018 CLINICAL DATA:  Nausea vomiting and diarrhea EXAM: CT ABDOMEN AND PELVIS WITH CONTRAST TECHNIQUE: Multidetector CT imaging of the abdomen and pelvis was performed using the standard protocol following bolus administration of intravenous contrast. CONTRAST:  ISOVUE-300  IOPAMIDOL (ISOVUE-300) INJECTION 61% COMPARISON:  CT 12/27/2017 FINDINGS: Lower chest: Mild atelectasis or scarring at the lingula. No acute consolidation or effusion. Stable pleural nodularity at the right central lung base. Heart size within normal limits. Fat density at the hiatus with mild mass effect on the IVC. Hepatobiliary: No focal liver abnormality is seen. No gallstones, gallbladder wall thickening, or biliary dilatation. Pancreas: Unremarkable. No pancreatic ductal dilatation or surrounding inflammatory changes. Spleen: Normal in size without focal abnormality. Adrenals/Urinary Tract: Adrenal glands are normal. No hydronephrosis. Multiple intrarenal stones bilaterally. The largest is seen on the left and measures up to 9 mm in size. There are cysts within the kidneys. The bladder is normal Stomach/Bowel: The stomach is slightly fluid-filled and dilated. Multiple loops of fluid-filled dilated small bowel measuring up to 3.9 cm. Transition points slightly to the right of midline near surgical sutures within the central abdomen. Additional transition to non dilated distal small bowel in the right lower quadrant, possible adhesion. No bowel wall thickening. Sigmoid colon diverticula without acute inflammatory change. Vascular/Lymphatic: Moderate aortic atherosclerosis. No aneurysm. No significantly enlarged lymph nodes. Reproductive: Negative Other: Negative for free air or free fluid. Small fat in the left inguinal canal. Musculoskeletal: No acute or significant osseous findings. IMPRESSION: 1. Multiple loops of dilated fluid-filled small bowel with transition points visualized within the right central abdomen and right lower quadrant, consistent with mechanical small bowel obstruction. Negative for free air or free fluid. 2. Multiple nonobstructing kidney stones 3. Descending and sigmoid colon diverticula without acute inflammatory change. Electronically Signed   By: Jasmine Pang M.D.   On: 08/23/2018  18:40    Anti-infectives: Anti-infectives (From admission, onward)   None       Assessment/Plan  Small Bowel Obstruction  CAI PHIPPS is a 57 y.o. male with recurrent small bowel obstruction most likely attributable to post-surgical adhesions given his significant abdominal surgical history which is complicated by bipolar, CHF, COPD, CAD, history of DVT, HTN, Hypothyroidism, and tobacco abuse (smoking).                          - Continue NGT to low intermittent suction, monitor output                         - NPO + IVF                         - Continue to monitor abdominal exam and ongoing bowel function   - Will obtain KUB to assess distension this morning.                          - If he continues to not show evidence of clinical improvement or KUB looks worse, discussed with the  patient that we may need to consider surgical intervention which he understands.                          - Mobilize as tolerates                         - Medical management of comorbidities, appreciate hospitalist help.   -- Lynden Oxford , PA-C [facility] 08/25/2018, 8:02 AM [phone] M-F: 7am - 4pm

## 2018-08-25 NOTE — Progress Notes (Signed)
[facility] - [place] at [facility]   PATIENT NAME: Brett Fernandez    MR#:  [id number]  DATE OF BIRTH:  1960/10/11  SUBJECTIVE:   Feeling little bit better today.  Had a runny bowel movement this morning. Passing flatus. Endorsing some increased "abdominal soreness" this morning.  REVIEW OF SYSTEMS:  Review of Systems  Constitutional: Negative for chills and fever.  HENT: Negative for congestion and sore throat.   Eyes: Negative for blurred vision and double vision.  Respiratory: Positive for cough, shortness of breath and wheezing.   Cardiovascular: Negative for chest pain, palpitations and leg swelling.  Gastrointestinal: Positive for abdominal pain. Negative for nausea and vomiting.  Genitourinary: Negative for dysuria and urgency.  Musculoskeletal: Negative for back pain and neck pain.  Neurological: Negative for dizziness and headaches.  Psychiatric/Behavioral: Negative for depression. The patient is not nervous/anxious.     DRUG ALLERGIES:   Allergies  Allergen Reactions  . Aspirin Other (See Comments)    Other Reaction: steven-johnson reaction [name]  . Bee Venom Anaphylaxis  . Sulfa Antibiotics Swelling   VITALS:  Blood pressure 113/79, pulse 96, temperature 98.4 F (36.9 C), temperature source Oral, resp. rate 18, height 5\' 7"  (1.702 m), weight 69.4 kg, SpO2 (!) 88 %. PHYSICAL EXAMINATION:  Physical Exam  GENERAL:  57 y.o.-year-old patient lying in the bed with no acute distress.  EYES: Pupils equal, round, reactive to light and accommodation. No scleral icterus. Extraocular muscles intact.  HEENT: Head atraumatic, normocephalic. Oropharynx and nasopharynx clear. + NG tube in place. NECK:  Supple, no jugular venous distention. No thyroid enlargement, no tenderness.  LUNGS: + Diffuse rhonchi present throughout all lung fields. No use of accessory muscles of respiration.  Nasal cannula in place. CARDIOVASCULAR: RRR, S1, S2  normal. No murmurs, rubs, or gallops.  ABDOMEN: Soft, + generalized tenderness to palpation, nondistended. Bowel sounds present. No organomegaly or mass.  EXTREMITIES: No pedal edema, cyanosis, or clubbing.  NEUROLOGIC: Cranial nerves II through XII are intact. Muscle strength 5/5 in all extremities. Sensation intact. Gait not checked.  PSYCHIATRIC: The patient is alert and oriented x 3.  SKIN: No obvious rash, lesion, or ulcer.  LABORATORY PANEL:  Male CBC Recent Labs  Lab 08/25/18 0447  WBC 11.7*  HGB 13.7  HCT 43.4  PLT 245   ------------------------------------------------------------------------------------------------------------------ Chemistries  Recent Labs  Lab 08/23/18 1610 08/23/18 2151  08/25/18 0447  NA 140  --    < > 138  K 3.1*  --    < > 3.9  CL 98  --    < > 101  CO2 29  --    < > 30  GLUCOSE 142*  --    < > 111*  BUN 20  --    < > 13  CREATININE 1.12  --    < > 1.03  CALCIUM 9.8  --    < > 8.2*  MG  --  2.0  --   --   AST 29  --   --   --   ALT 26  --   --   --   ALKPHOS 64  --   --   --   BILITOT 0.8  --   --   --    < > = values in this interval not displayed.   RADIOLOGY:  Dg Abd 2 Views  Result Date: 08/25/2018 CLINICAL DATA:  Small-bowel obstruction EXAM: ABDOMEN - 2 VIEW COMPARISON:  08/23/2018 CT abdomen and pelvis FINDINGS: Gas throughout colon to the sigmoid level. Few mildly dilated small bowel loops in abdomen. No bowel wall thickening or free air. Bibasilar atelectasis. Nasogastric tube tip is within the distal esophagus, with proximal side-port at the level of the inferior aspect of the aortic arch, recommend advancing tube 20 cm to place proximal side-port at stomach. No urinary tract calcification. IMPRESSION: Decreased small bowel distention since previous exam. Recommend advancing nasogastric 20 cm to place proximal side-port within the stomach. Findings called to [facility] RN on 08/25/2018 at 0954 hrs. Electronically Signed   By: Ulyses Southward  M.D.   On: 08/25/2018 09:54   ASSESSMENT AND PLAN:   Acute recurrent small bowel obstruction with transition point- having some increased abdominal "soreness" this morning.  Repeat abdominal x-ray this morning with improvement in small bowel distention. -Continue NG tube, may be heading towards clamping trial. -Surgery following -IV antiemetics and IV pain meds -Continue IV fluids  Acute hypokalemia- resolved -Recheck in the morning  Mild COPD exacerbation- diffuse rhonchi present on exam.  Requiring 2 L O2.  Became hypoxic on room air this morning. -Continue duo nebs and albuterol -Continue home inhalers -Wean O2 as able  Hypertension- normotensive this morning. -Continue metoprolol at decreased dose -Hydralazine PRN  Chronic diastolic heart failure-no signs of acute exacerbation -Continue metoprolol at decreased dose  Tobacco abuse-current smoker -Nicotine patch -Tobacco cessation counseling provided this admission  Bipolar disorder-stable -Continue Cymbalta, Klonopin, Remeron, Geodon  History of DVT- INR 1.05 -Holding Coumadin in case of surgery -DVT prophylaxis with heparin  All the records are reviewed and case discussed with Care Management/Social Worker. Management plans discussed with the patient, family and they are in agreement.  CODE STATUS: Full Code  TOTAL TIME TAKING CARE OF THIS PATIENT: 35 minutes.   More than 50% of the time was spent in counseling/coordination of care: YES  POSSIBLE D/C IN 2-3 DAYS, DEPENDING ON CLINICAL CONDITION.   Jinny Blossom Ranie Chinchilla M.D on 08/25/2018 at 2:25 PM  Between 7am to 6pm - Pager - [phone]  After 6pm go to [email or web address] - Social research officer, government  [facility] [place] [facility]  Office  [phone]  CC: Primary care physician; Dortha Kern, MD  Note: This dictation was prepared with Dragon dictation along with smaller phrase technology. Any transcriptional errors that result from this process are  unintentional.

## 2018-08-25 NOTE — Progress Notes (Signed)
Received a call from radiology to advance NG 20cm,  Informed patient and advanced NG. Patient tolerated well.

## 2018-08-25 NOTE — Progress Notes (Signed)
Received update on NG order. Clamped NG at 1200.

## 2018-08-26 LAB — PROTIME-INR
INR: 0.94
Prothrombin Time: 12.5 seconds (ref 11.4–15.2)

## 2018-08-26 LAB — BASIC METABOLIC PANEL
Anion gap: 6 (ref 5–15)
BUN: 10 mg/dL (ref 6–20)
CALCIUM: 8 mg/dL — AB (ref 8.9–10.3)
CO2: 24 mmol/L (ref 22–32)
Chloride: 109 mmol/L (ref 98–111)
Creatinine, Ser: 0.87 mg/dL (ref 0.61–1.24)
GFR calc Af Amer: >60 mL/min (ref >60)
Glucose, Bld: 103 mg/dL — ABNORMAL HIGH (ref 70–99)
Potassium: 4.1 mmol/L (ref 3.5–5.1)
Sodium: 139 mmol/L (ref 135–145)

## 2018-08-26 LAB — CBC
HCT: 40.8 % (ref 39.0–52.0)
Hemoglobin: 13 g/dL (ref 13.0–17.0)
MCH: 31.4 pg (ref 26.0–34.0)
MCHC: 31.9 g/dL (ref 30.0–36.0)
MCV: 98.6 fL (ref 80.0–100.0)
Platelets: 246 10*3/uL (ref 150–400)
RBC: 4.14 MIL/uL — ABNORMAL LOW (ref 4.22–5.81)
RDW: 13.5 % (ref 11.5–15.5)
WBC: 11.9 10*3/uL — ABNORMAL HIGH (ref 4.0–10.5)
nRBC: 0 % (ref 0.0–0.2)

## 2018-08-26 MED ORDER — IPRATROPIUM-ALBUTEROL 0.5-2.5 (3) MG/3ML IN SOLN: 3.0000 mL | Freq: Three times a day (TID) | RESPIRATORY_TRACT | Status: DC

## 2018-08-26 MED ORDER — SIMETHICONE 80 MG PO CHEW
80.0000 mg | CHEWABLE_TABLET | Freq: Four times a day (QID) | ORAL | Status: DC | PRN
  Administered 2018-08-26: 80 mg via ORAL
  Filled 2018-08-26 (×2): qty 1

## 2018-08-26 NOTE — Progress Notes (Signed)
Brett Fernandez Progress Note     Subjective: NGT out last night after clamping trial however remained NPO. This morning, Brett Fernandez reports that his abdominal pain has resolved and Brett Fernandez denied any complaints of nausea or emesis. Brett Fernandez does endorse flatus and a BM yesterday. No additional complaints this morning.   Objective: Vital signs in last 24 hours: Temp:  [97.9 F (36.6 C)-98.1 F (36.7 C)] 97.9 F (36.6 C) (12/11 2341) Pulse Rate:  [86-87] 86 (12/11 2341) Resp:  [18] 18 (12/11 2341) BP: (116-121)/(75-86) 121/86 (12/11 2341) SpO2:  [95 %] 95 % (12/11 2341) Weight:  [77 kg] 77 kg (12/12 0500) Last BM Date: 08/25/18  Intake/Output from previous day: 12/11 0701 - 12/12 0700 In: 1901.1 [I.V.:1901.1] Out: -  Intake/Output this shift: No intake/output data recorded.  PE: Gen:  Alert, NAD, pleasant HEENT: NGT in place Card:   Pulm:  Normal effort Abd: Soft, non-tender, non-distended. Previous well healed midline laparotomy incision Skin: warm and dry, no rashes  Psych: A&Ox3   Lab Results:  Recent Labs    08/25/18 0447 08/26/18 0436  WBC 11.7* 11.9*  HGB 13.7 13.0  HCT 43.4 40.8  PLT 245 246   BMET Recent Labs    08/25/18 0447 08/26/18 0436  NA 138 139  K 3.9 4.1  CL 101 109  CO2 30 24  GLUCOSE 111* 103*  BUN 13 10  CREATININE 1.03 0.87  CALCIUM 8.2* 8.0*   PT/INR Recent Labs    08/25/18 0447 08/26/18 0436  LABPROT 12.8 12.5  INR 0.97 0.94   CMP     Component Value Date/Time   NA 139 08/26/2018 0436   NA 141 10/13/2014 0518   K 4.1 08/26/2018 0436   K 4.1 10/13/2014 0518   CL 109 08/26/2018 0436   CL 109 (H) 10/13/2014 0518   CO2 24 08/26/2018 0436   CO2 27 10/13/2014 0518   GLUCOSE 103 (H) 08/26/2018 0436   GLUCOSE 125 (H) 10/13/2014 0518   BUN 10 08/26/2018 0436   BUN 8 10/13/2014 0518   CREATININE 0.87 08/26/2018 0436   CREATININE 1.04 10/13/2014 0518   CALCIUM 8.0 (L) 08/26/2018 0436   CALCIUM 8.3 (L) 10/13/2014 0518   PROT  7.6 08/23/2018 1610   ALBUMIN 4.2 08/23/2018 1610   AST 29 08/23/2018 1610   ALT 26 08/23/2018 1610   ALKPHOS 64 08/23/2018 1610   BILITOT 0.8 08/23/2018 1610   GFRNONAA >60 08/26/2018 0436   GFRNONAA >60 10/13/2014 0518   GFRNONAA >60 02/20/2012 1257   GFRAA >60 08/26/2018 0436   GFRAA >60 10/13/2014 0518   GFRAA >60 02/20/2012 1257   Lipase     Component Value Date/Time   LIPASE 72 (H) 08/23/2018 1610       Studies/Results: Dg Abd 2 Views  Result Date: 08/25/2018 CLINICAL DATA:  Small-bowel obstruction EXAM: ABDOMEN - 2 VIEW COMPARISON:  08/23/2018 CT abdomen and pelvis FINDINGS: Gas throughout colon to the sigmoid level. Few mildly dilated small bowel loops in abdomen. No bowel wall thickening or free air. Bibasilar atelectasis. Nasogastric tube tip is within the distal esophagus, with proximal side-port at the level of the inferior aspect of the aortic arch, recommend advancing tube 20 cm to place proximal side-port at stomach. No urinary tract calcification. IMPRESSION: Decreased small bowel distention since previous exam. Recommend advancing nasogastric 20 cm to place proximal side-port within the stomach. Findings called to [facility] RN on 08/25/2018 at 0954 hrs. Electronically Signed   By: Loraine Leriche  Tyron Russell M.D.   On: 08/25/2018 09:54    Anti-infectives: Anti-infectives (From admission, onward)   None       Assessment/Plan  Small Bowel Obstruction Lajean Silvius y.o.malewith clinically improving recurrent small bowel obstruction most likely attributable to post-surgical adhesions given his significant abdominal surgical history which is complicated by bipolar, CHF, COPD, CAD, history of DVT, HTN, Hypothyroidism, and tobacco abuse (smoking).  - Advanced to clear liquid diet + IVF (can wean as diet advances)   - If tolerates clear for breakfast lunch without increase in pain, nausea, or emesis then can advance to full liquids for dinner.   - Continue to monitor abdominal exam and ongoing bowel function - No indication for surgical intervention at this time.  - Mobilize as tolerates - Medical management of comorbidities, appreciate hospitalist help.   -- Brett Fernandez , PA-C  Surgical Fernandez 08/26/2018, 9:05 AM [phone] M-F: 7am - 4pm

## 2018-08-26 NOTE — Progress Notes (Signed)
[facility] - [place] at [facility]   PATIENT NAME: Saintclair Schroader    MR#:  [id number]  DATE OF BIRTH:  [birth year]  SUBJECTIVE:   NG tube removed last night.  He did have an episode of nausea last night after eating ice chips, but this is resolved this morning.  He endorses continued abdominal soreness.  He did have a bowel movement this morning and has been passing flatus.  REVIEW OF SYSTEMS:  Review of Systems  Constitutional: Negative for chills and fever.  HENT: Negative for congestion and sore throat.   Eyes: Negative for blurred vision and double vision.  Respiratory: Positive for cough. Negative for shortness of breath and wheezing.   Cardiovascular: Negative for chest pain, palpitations and leg swelling.  Gastrointestinal: Positive for abdominal pain. Negative for nausea and vomiting.  Genitourinary: Negative for dysuria and urgency.  Musculoskeletal: Negative for back pain and neck pain.  Neurological: Negative for dizziness and headaches.  Psychiatric/Behavioral: Negative for depression. The patient is not nervous/anxious.     DRUG ALLERGIES:   Allergies  Allergen Reactions  . Aspirin Other (See Comments)    Other Reaction: steven-johnson reaction [name]  . Bee Venom Anaphylaxis  . Sulfa Antibiotics Swelling   VITALS:  Blood pressure 113/76, pulse 93, temperature 97.9 F (36.6 C), temperature source Oral, resp. rate 18, height 5\' 7"  (1.702 m), weight 77 kg, SpO2 93 %. PHYSICAL EXAMINATION:  Physical Exam  GENERAL:  57 y.o.-year-old patient lying in the bed with no acute distress.  EYES: Pupils equal, round, reactive to light and accommodation. No scleral icterus. Extraocular muscles intact.  HEENT: Head atraumatic, normocephalic. Oropharynx and nasopharynx clear. NECK:  Supple, no jugular venous distention. No thyroid enlargement, no tenderness.  LUNGS: CTAB. No use of accessory muscles of respiration.  CARDIOVASCULAR: RRR,  S1, S2 normal. No murmurs, rubs, or gallops.  ABDOMEN: Soft,  nontender, nondistended. Bowel sounds present. No organomegaly or mass.  EXTREMITIES: No pedal edema, cyanosis, or clubbing.  NEUROLOGIC: Cranial nerves II through XII are intact. + global weakness. Sensation intact. Gait not checked.  PSYCHIATRIC: The patient is alert and oriented x 3.  SKIN: No obvious rash, lesion, or ulcer.  LABORATORY PANEL:  Male CBC Recent Labs  Lab 08/26/18 0436  WBC 11.9*  HGB 13.0  HCT 40.8  PLT 246   ------------------------------------------------------------------------------------------------------------------ Chemistries  Recent Labs  Lab 08/23/18 1610 08/23/18 2151  08/26/18 0436  NA 140  --    < > 139  K 3.1*  --    < > 4.1  CL 98  --    < > 109  CO2 29  --    < > 24  GLUCOSE 142*  --    < > 103*  BUN 20  --    < > 10  CREATININE 1.12  --    < > 0.87  CALCIUM 9.8  --    < > 8.0*  MG  --  2.0  --   --   AST 29  --   --   --   ALT 26  --   --   --   ALKPHOS 64  --   --   --   BILITOT 0.8  --   --   --    < > = values in this interval not displayed.   RADIOLOGY:  No results found. ASSESSMENT AND PLAN:   Acute recurrent small bowel obstruction with transition point- NG tube  removed last night -Clear liquid diet today, if tolerates will advance to full liquid diet for dinner -Surgery following -IV antiemetics and IV pain meds -We will DC IV fluids if good p.o. intake today  Mild COPD exacerbation-improving.  Stable on room air this morning.  -Continue duo nebs and albuterol -Continue home inhalers -Wean O2 as able  Hypertension- normotensive this morning. -Continue metoprolol at decreased dose -Hydralazine PRN  Chronic diastolic heart failure-no signs of acute exacerbation -Continue metoprolol at decreased dose  Tobacco abuse- current smoker -Nicotine patch -Tobacco cessation counseling provided this admission  Bipolar disorder-stable -Continue Cymbalta,  Klonopin, Remeron, Geodon  History of DVT- INR 1.05 -Holding Coumadin in case of surgery, may be able to restart Coumadin tomorrow -DVT prophylaxis with heparin  All the records are reviewed and case discussed with Care Management/Social Worker. Management plans discussed with the patient, family and they are in agreement.  CODE STATUS: Full Code  TOTAL TIME TAKING CARE OF THIS PATIENT: 35 minutes.   More than 50% of the time was spent in counseling/coordination of care: YES  POSSIBLE D/C IN 2-3 DAYS, DEPENDING ON CLINICAL CONDITION.   Jinny Blossom  M.D on 08/26/2018 at 1:40 PM  Between 7am to 6pm - Pager - [phone]  After 6pm go to [email or web address] - Social research officer, government  [facility] [place] [facility]  Office  [phone]  CC: Primary care physician; Dortha Kern, MD  Note: This dictation was prepared with Dragon dictation along with smaller phrase technology. Any transcriptional errors that result from this process are unintentional.

## 2018-08-27 ENCOUNTER — Inpatient Hospital Stay: Payer: Medicaid Other

## 2018-08-27 LAB — BASIC METABOLIC PANEL
Anion gap: 3 — ABNORMAL LOW (ref 5–15)
BUN: 8 mg/dL (ref 6–20)
CHLORIDE: 109 mmol/L (ref 98–111)
CO2: 25 mmol/L (ref 22–32)
Calcium: 8.5 mg/dL — ABNORMAL LOW (ref 8.9–10.3)
Creatinine, Ser: 0.9 mg/dL (ref 0.61–1.24)
GFR calc Af Amer: >60 mL/min (ref >60)
GFR calc non Af Amer: >60 mL/min (ref >60)
Glucose, Bld: 106 mg/dL — ABNORMAL HIGH (ref 70–99)
POTASSIUM: 4.4 mmol/L (ref 3.5–5.1)
Sodium: 137 mmol/L (ref 135–145)

## 2018-08-27 LAB — CBC
HCT: 40.7 % (ref 39.0–52.0)
Hemoglobin: 12.8 g/dL — ABNORMAL LOW (ref 13.0–17.0)
MCH: 31.5 pg (ref 26.0–34.0)
MCHC: 31.4 g/dL (ref 30.0–36.0)
MCV: 100.2 fL — ABNORMAL HIGH (ref 80.0–100.0)
NRBC: 0 % (ref 0.0–0.2)
Platelets: 251 10*3/uL (ref 150–400)
RBC: 4.06 MIL/uL — AB (ref 4.22–5.81)
RDW: 13.2 % (ref 11.5–15.5)
WBC: 9.4 10*3/uL (ref 4.0–10.5)

## 2018-08-27 LAB — PROTIME-INR
INR: 0.96
Prothrombin Time: 12.7 seconds (ref 11.4–15.2)

## 2018-08-27 MED ORDER — LACTATED RINGERS IV SOLN
INTRAVENOUS | Status: DC
  Administered 2018-08-27 – 2018-08-28 (×3): via INTRAVENOUS

## 2018-08-27 NOTE — Progress Notes (Signed)
Patient's BP 99/87, HR 91. Patient is sleepy, able to arouse. ABD is semi-firm and has no appetite. ABD pain is higher now than this am. Found two tins of snuff.  Opened containers and it appears to have said consents in container. Notified MD. New orders for ABD X-ray ordered.

## 2018-08-27 NOTE — Progress Notes (Signed)
[facility] - [place] at [facility]   PATIENT NAME: Brett Fernandez    MR#:  [id number]  DATE OF BIRTH:  01-25-1961  SUBJECTIVE:   Feeling better this morning and able to tolerate a liquid diet.  Still endorsing abdominal "soreness".  Throughout the day today, he had worsening abdominal pain and distention.  REVIEW OF SYSTEMS:  Review of Systems  Constitutional: Negative for chills and fever.  HENT: Negative for congestion and sore throat.   Eyes: Negative for blurred vision and double vision.  Respiratory: Negative for cough, shortness of breath and wheezing.   Cardiovascular: Negative for chest pain, palpitations and leg swelling.  Gastrointestinal: Positive for abdominal pain. Negative for nausea and vomiting.  Genitourinary: Negative for dysuria and urgency.  Musculoskeletal: Negative for back pain and neck pain.  Neurological: Negative for dizziness and headaches.  Psychiatric/Behavioral: Negative for depression. The patient is not nervous/anxious.     DRUG ALLERGIES:   Allergies  Allergen Reactions  . Aspirin Other (See Comments)    Other Reaction: steven-johnson reaction [name]  . Bee Venom Anaphylaxis  . Sulfa Antibiotics Swelling   VITALS:  Blood pressure 99/87, pulse 92, temperature 98.3 F (36.8 C), temperature source Oral, resp. rate 18, height 5\' 7"  (1.702 m), weight 78 kg, SpO2 92 %. PHYSICAL EXAMINATION:  Physical Exam  GENERAL:  57 y.o.-year-old patient lying in the bed with no acute distress.  EYES: Pupils equal, round, reactive to light and accommodation. No scleral icterus. Extraocular muscles intact.  HEENT: Head atraumatic, normocephalic. Oropharynx and nasopharynx clear. NECK:  Supple, no jugular venous distention. No thyroid enlargement, no tenderness.  LUNGS: CTAB. No use of accessory muscles of respiration.  CARDIOVASCULAR: RRR, S1, S2 normal. No murmurs, rubs, or gallops.  ABDOMEN: Soft,  nontender. + Mild  distention.  + Hypoactive bowel sounds.  No rebound or guarding.  No organomegaly or mass.  EXTREMITIES: No pedal edema, cyanosis, or clubbing.  NEUROLOGIC: Cranial nerves II through XII are intact. + global weakness. Sensation intact. Gait not checked.  PSYCHIATRIC: The patient is alert and oriented x 3.  SKIN: No obvious rash, lesion, or ulcer.  LABORATORY PANEL:  Male CBC Recent Labs  Lab 08/27/18 0444  WBC 9.4  HGB 12.8*  HCT 40.7  PLT 251   ------------------------------------------------------------------------------------------------------------------ Chemistries  Recent Labs  Lab 08/23/18 1610 08/23/18 2151  08/27/18 0444  NA 140  --    < > 137  K 3.1*  --    < > 4.4  CL 98  --    < > 109  CO2 29  --    < > 25  GLUCOSE 142*  --    < > 106*  BUN 20  --    < > 8  CREATININE 1.12  --    < > 0.90  CALCIUM 9.8  --    < > 8.5*  MG  --  2.0  --   --   AST 29  --   --   --   ALT 26  --   --   --   ALKPHOS 64  --   --   --   BILITOT 0.8  --   --   --    < > = values in this interval not displayed.   RADIOLOGY:  Dg Abd 1 View  Result Date: 08/27/2018 CLINICAL DATA:  Abdominal pain EXAM: ABDOMEN - 1 VIEW COMPARISON:  08/25/18 FINDINGS: Scattered large and small bowel  gas is noted. The stomach is distended with air as the nasogastric catheter has been removed. Stable mild small bowel dilatation is noted. Scattered calcifications are noted the left consistent with renal calculi. These are stable from recent CT examination. No ureteral stones are seen. No free air is noted. IMPRESSION: Stable small bowel dilatation. The stomach is distended with air as the nasogastric catheter has been removed. Electronically Signed   By: Alcide Clever M.D.   On: 08/27/2018 14:25   ASSESSMENT AND PLAN:   Acute recurrent small bowel obstruction- NG tube removed 12/11.  Having some worsening abdominal pain and distention during the day today.  Repeat abdominal x-ray today with stomach  distention. -Was on GI soft diet, will transition back to clear liquid diet -Restart IV fluids -Surgery following -Plan for repeat KUB in the morning -IV antiemetics and IV pain meds -If he has worsening abdominal symptoms, will need to reinsert NG tube  Mild COPD exacerbation- resolved.  Stable on room air. -Continue duo nebs and albuterol -Continue home inhalers  Hypertension- normotensive this morning. -Continue metoprolol at decreased dose -Hydralazine PRN  Chronic diastolic heart failure- no signs of acute exacerbation -Continue metoprolol at decreased dose  Tobacco abuse- current smoker -Nicotine patch -Tobacco cessation counseling provided this admission  Bipolar disorder- stable -Continue Cymbalta, Klonopin, Remeron, Geodon  History of DVT -Holding Coumadin in case of surgery -DVT prophylaxis with heparin  All the records are reviewed and case discussed with Care Management/Social Worker. Management plans discussed with the patient, family and they are in agreement.  CODE STATUS: Full Code  TOTAL TIME TAKING CARE OF THIS PATIENT: 45 minutes.   More than 50% of the time was spent in counseling/coordination of care: YES  POSSIBLE D/C IN 2-3 DAYS, DEPENDING ON CLINICAL CONDITION.   Jinny Blossom Kamilya Wakeman M.D on 08/27/2018 at 3:49 PM  Between 7am to 6pm - Pager - [phone]  After 6pm go to [email or web address] - Social research officer, government  [facility] [place] [facility]  Office  [phone]  CC: Primary care physician; Dortha Kern, MD  Note: This dictation was prepared with Dragon dictation along with smaller phrase technology. Any transcriptional errors that result from this process are unintentional.

## 2018-08-27 NOTE — Progress Notes (Signed)
[facility] SURGICAL PROGRESS NOTE (cpt [phone])  Hospital Day(s): 4.   Post op day(s):  Marland Kitchen   Interval History: Patient seen and examined, no acute events or new complaints overnight. Patient reports that he continues to have abdominal soreness which is exacerbated with coughing. No complaints of nausea or emesis. He continues to endorse passing flatus and having bowel movements which he described as diarrhea. Tolerated a full liquid diet and is asking for something more solid to eat. Mobilizing well. No additional complaints.   Review of Systems:  Constitutional: denies fever, chills  HEENT: denies congestion, + cough Respiratory: denies any shortness of breath  Cardiovascular: denies chest pain or palpitations  Gastrointestinal: + Abdominal soreness, denied N/V, or diarrhea/and bowel function as per interval history Genitourinary: denies burning with urination or urinary frequency Musculoskeletal: denies pain, decreased motor or sensation Integumentary: denies any other rashes or skin discolorations Neurological: denies HA or vision/hearing changes   Vital signs in last 24 hours: [min-max] current  Temp:  [98.1 F (36.7 C)-98.6 F (37 C)] 98.3 F (36.8 C) (12/13 0730) Pulse Rate:  [78-84] 78 (12/13 0730) Resp:  [16-17] 17 (12/13 0730) BP: (103-123)/(73-80) 123/73 (12/13 0730) SpO2:  [93 %-96 %] 93 % (12/13 0730) Weight:  [78 kg] 78 kg (12/13 0500)     Height: 5\' 7"  (170.2 cm) Weight: 78 kg BMI (Calculated): 26.93   Intake/Output this shift:  No intake/output data recorded.   Intake/Output last 2 shifts:  @IOLAST2SHIFTS @   Physical Exam:  Constitutional: alert, cooperative and no distress  HENT: normocephalic without obvious abnormality  Eyes: EOM's grossly intact and symmetric  Respiratory: breathing non-labored at rest  Gastrointestinal: soft, non-tender, and non-distended Musculoskeletal: no edema or wounds, motor and sensation grossly intact, NT     Labs:  CBC Latest Ref Rng & Units 08/27/2018 08/26/2018 08/25/2018  WBC 4.0 - 10.5 K/uL 9.4 11.9(H) 11.7(H)  Hemoglobin 13.0 - 17.0 g/dL 12.8(L) 13.0 13.7  Hematocrit 39.0 - 52.0 % 40.7 40.8 43.4  Platelets 150 - 400 K/uL 251 246 245   CMP Latest Ref Rng & Units 08/27/2018 08/26/2018 08/25/2018  Glucose 70 - 99 mg/dL 14/08/2018) 14/07/2018) 119(E)  BUN 6 - 20 mg/dL 8 10 13   Creatinine 0.61 - 1.24 mg/dL 174(Y 814(G  Sodium 135 - 145 mmol/L 137 139 138  Potassium 3.5 - 5.1 mmol/L 4.4 4.1 3.9  Chloride 98 - 111 mmol/L 109 109 101  CO2 22 - 32 mmol/L 25 24 30   Calcium 8.9 - 10.3 mg/dL 8.18) 5.63) 1.49)  Total Protein 6.5 - 8.1 g/dL - - -  Total Bilirubin 0.3 - 1.2 mg/dL - - -  Alkaline Phos 38 - 126 U/L - - -  AST 15 - 41 U/L - - -  ALT 0 - 44 U/L - - -     Imaging studies: No new pertinent imaging studies   Assessment/Plan:  ERYC BODEY a57 y.o.malewith clinically improving recurrent small bowel obstructionmost likely attributable to post-surgical adhesions given his significant abdominal surgical historywhich is complicated by bipolar, CHF, COPD, CAD, history of DVT, HTN, Hypothyroidism, and tobacco abuse (smoking).   - Advance to soft diet, if tolerating consider advancing to regular diet for dinner or breakfast tomorrow   - Wean IVF as tolerating diet  - Continue to monitor abdominal exam and ongoing bowel function -No indication for surgical intervention at this time.  - Mobilize as tolerates - Medical management of comorbidities, appreciate hospitalist help.   -- 7.0(Y  Gaynell Face [facility] 08/27/2018, 8:45 AM [phone] M-F: 7am - 4pm

## 2018-08-27 NOTE — Progress Notes (Signed)
Brief Progress Note  Subjective:  Patient discussed with RN and Hospitalist physician. Patient reporting increasing abdominal "soreness" in his periumbical region which he endorses is slightly worse from evaluation this morning. No complaints of nausea or emesis, but he and nursing staff report decreased appetite and no flatus/BM today. In addition, patient appears more sedated than this morning despite not requiring any pain medications or sedative medications.   Objective Blood pressure 99/87, pulse 92, temperature 98.3 F (36.8 C), temperature source Oral, resp. rate 18, height 5\' 7"  (1.702 m), weight 78 kg, SpO2 92 %.  Constitutional: Somnolent, arouses to verbal stimuli Pulmonary: No respiratory distress, normal effort Gastrointestinal: Soft, Obese, Tenderness worse periumbilically, non-distended, no rebound, guarding, or rigidity   Assessment/Plan:  Patient appears to have signs of unresolved vs recurrent xmall bowel obstruction.     - Back down to clear liquid diet   - Restart IVF (LR at 75 ml/hr)   - Will re-check AM Labs (CBC/BMP)   - Will re-check morning KUB   - Continue to monitor abdominal examination and on-going bowel function   - If patient develops worsening abdominal pain, distension, or nausea/emesis will re-insert NGT and consider surgical intervention, which the patient was understanding of.    - Mobilize as tolerates  -- , PA-C [facility] 08/27/2018, 3:39 PM [phone] M-F: 7am - 4pm

## 2018-08-27 NOTE — Care Management (Signed)
RNCM attempted to assess patient for discharge planning however during my rounds he was sleeping and I did not awaken him. RNCM to follow.

## 2018-08-28 ENCOUNTER — Inpatient Hospital Stay: Payer: Medicaid Other

## 2018-08-28 LAB — CBC
HCT: 39.9 % (ref 39.0–52.0)
Hemoglobin: 12.5 g/dL — ABNORMAL LOW (ref 13.0–17.0)
MCH: 31.1 pg (ref 26.0–34.0)
MCHC: 31.3 g/dL (ref 30.0–36.0)
MCV: 99.3 fL (ref 80.0–100.0)
Platelets: 282 10*3/uL (ref 150–400)
RBC: 4.02 MIL/uL — ABNORMAL LOW (ref 4.22–5.81)
RDW: 13 % (ref 11.5–15.5)
WBC: 10.7 10*3/uL — ABNORMAL HIGH (ref 4.0–10.5)
nRBC: 0 % (ref 0.0–0.2)

## 2018-08-28 LAB — BASIC METABOLIC PANEL
ANION GAP: 5 (ref 5–15)
BUN: 13 mg/dL (ref 6–20)
CALCIUM: 8.7 mg/dL — AB (ref 8.9–10.3)
CO2: 26 mmol/L (ref 22–32)
Chloride: 107 mmol/L (ref 98–111)
Creatinine, Ser: 0.87 mg/dL (ref 0.61–1.24)
GFR calc Af Amer: >60 mL/min (ref >60)
Glucose, Bld: 108 mg/dL — ABNORMAL HIGH (ref 70–99)
Potassium: 4.2 mmol/L (ref 3.5–5.1)
Sodium: 138 mmol/L (ref 135–145)

## 2018-08-28 LAB — PROTIME-INR
INR: 0.91
Prothrombin Time: 12.2 seconds (ref 11.4–15.2)

## 2018-08-28 MED ORDER — BISACODYL 10 MG RE SUPP
10.0000 mg | Freq: Once | RECTAL | Status: AC
  Administered 2018-08-28: 10 mg via RECTAL
  Filled 2018-08-28: qty 1

## 2018-08-28 MED ORDER — WARFARIN SODIUM 6 MG PO TABS
6.0000 mg | ORAL_TABLET | Freq: Once | ORAL | Status: AC
  Administered 2018-08-28: 6 mg via ORAL
  Filled 2018-08-28: qty 1

## 2018-08-28 MED ORDER — WARFARIN - PHARMACIST DOSING INPATIENT: Freq: Every day | Status: DC

## 2018-08-28 NOTE — Progress Notes (Addendum)
ANTICOAGULATION CONSULT NOTE  Pharmacy Consult for warfarin Indication: VTE prophylaxis/treatment  Allergies  Allergen Reactions  . Aspirin Other (See Comments)    Other Reaction: steven-johnson reaction [name]  . Bee Venom Anaphylaxis  . Sulfa Antibiotics Swelling    Patient Measurements: Height: 5\' 7"  (170.2 cm) Weight: 173 lb 4.5 oz (78.6 kg) IBW/kg (Calculated) : 66.1  Vital Signs: Temp: 98.7 F (37.1 C) (12/14 0740) Temp Source: Oral (12/14 0740) BP: 91/76 (12/14 0740) Pulse Rate: 86 (12/14 0740)  Labs: Recent Labs    08/26/18 0436 08/27/18 0444 08/28/18 0348  HGB 13.0 12.8* 12.5*  HCT 40.8 40.7 39.9  PLT 246 251 282  LABPROT 12.5 12.7 12.2  INR 0.94 0.96 0.91  CREATININE 0.87 0.90 0.87    Estimated Creatinine Clearance: 87.6 mL/min (by C-G formula based on SCr of 0.87 mg/dL).   Medical History: Past Medical History:  Diagnosis Date  . Allergy   . Anginal pain (HCC)   . Bipolar disorder (HCC)   . BPH (benign prostatic hypertrophy)   . CHF (congestive heart failure) (HCC)   . COPD (chronic obstructive pulmonary disease) (HCC)   . COPD exacerbation (HCC) 11/02/2015  . Coronary artery disease   . Deep vein thrombosis (DVT) (HCC)   . DJD (degenerative joint disease)   . Gastric ulcer   . GERD (gastroesophageal reflux disease)   . History of hiatal hernia   . Hypertension   . Hypothyroidism   . Kidney stones   . Lethargy 11/02/2015  . Neuromuscular disorder (HCC)   . PTSD (post-traumatic stress disorder)     Assessment: 57 year old male on warfarin PTA for history of DVT. Patient's home regimen is 4 mg daily. Warfarin has been on hold for r/o of possible surgical intervention. Pharmacy has been consulted to restart warfarin.  Per MD, will continue subq heparin and omit therapeutic bridging.  Date INR Dose 12/14 0.91   Goal of Therapy:  INR 2-3 Monitor platelets by anticoagulation protocol: Yes   Plan:  Will give one time  dose of warfarin 6 mg tonight at 1800. INR ordered with morning labs.  1/15, PharmD Pharmacy Resident  08/28/2018 1:52 PM

## 2018-08-28 NOTE — Progress Notes (Signed)
SURGICAL PROGRESS NOTE   Hospital Day(s): 5.   Post op day(s):  Marland Kitchen   Interval History: Patient seen and examined, no acute events or new complaints overnight. Patient reports mild abdominal soreness, denies nausea or vomiting. Refers continue passing gas. He does not want liquid diet anymore, wants soft diet.   Vital signs in last 24 hours: [min-max] current  Temp:  [98 F (36.7 C)-98.7 F (37.1 C)] 98.7 F (37.1 C) (12/14 0740) Pulse Rate:  [79-92] 86 (12/14 0740) Resp:  [16-18] 16 (12/13 2334) BP: (91-136)/(75-87) 91/76 (12/14 0740) SpO2:  [90 %-95 %] 90 % (12/14 0740) Weight:  [78.6 kg] 78.6 kg (12/14 0445)     Height: 5\' 7"  (170.2 cm) Weight: 78.6 kg BMI (Calculated): 27.13   Physical Exam:  Constitutional: alert, cooperative and no distress  Respiratory: breathing non-labored at rest  Cardiovascular: regular rate and sinus rhythm  Gastrointestinal: soft, non-tender, and non-distended  Labs:  CBC Latest Ref Rng & Units 08/28/2018 08/27/2018 08/26/2018  WBC 4.0 - 10.5 K/uL 10.7(H) 9.4 11.9(H)  Hemoglobin 13.0 - 17.0 g/dL 12.5(L) 12.8(L) 13.0  Hematocrit 39.0 - 52.0 % 39.9 40.7 40.8  Platelets 150 - 400 K/uL 282 251 246   CMP Latest Ref Rng & Units 08/28/2018 08/27/2018 08/26/2018  Glucose 70 - 99 mg/dL 14/08/2018) 716(R) 678(L)  BUN 6 - 20 mg/dL 13 8 10   Creatinine 0.61 - 1.24 mg/dL 381(O 1.75  Sodium 135 - 145 mmol/L 138 137 139  Potassium 3.5 - 5.1 mmol/L 4.2 4.4 4.1  Chloride 98 - 111 mmol/L 107 109 109  CO2 22 - 32 mmol/L 26 25 24   Calcium 8.9 - 10.3 mg/dL 1.02) 5.85) )  Total Protein 6.5 - 8.1 g/dL - - -  Total Bilirubin 0.3 - 1.2 mg/dL - - -  Alkaline Phos 38 - 126 U/L - - -  AST 15 - 41 U/L - - -  ALT 0 - 44 U/L - - -    Imaging studies:  EXAM: ABDOMEN - 2 VIEW  COMPARISON:  Radiographs of August 27, 2018.  FINDINGS: The bowel gas pattern is normal. There is no evidence of free air. No radio-opaque calculi or other significant  radiographic abnormality is seen.  IMPRESSION: No evidence of bowel obstruction or ileus.   Electronically Signed   By: 8.2(U, M.D.   On: 08/28/2018 08:43   Assessment/Plan:  A57 y.o.malewithresolving small bowel obstruction complicated by bipolar, CHF, COPD, CAD, history of DVT, HTN, Hypothyroidism, and tobacco abuse (smoking). Patient today feeling better than yesterday with mild "soreness". When told to advance diet he wants soft diet. Will try soft diet again and assess if tolerates. Personally reviewed the xray and there is normal gas pattern. Patient passing gas. Will follow.   August 29, 2018, MD

## 2018-08-28 NOTE — Progress Notes (Signed)
[facility] [place] [facility]   PATIENT NAME: Brett Fernandez    MR#:  [id number]  DATE OF BIRTH:  09-Apr-1961  SUBJECTIVE:   Patient continues to have abdominal discomfort has some gas no bowel movement  REVIEW OF SYSTEMS:  Review of Systems  Constitutional: Negative for chills and fever.  HENT: Negative for congestion and sore throat.   Eyes: Negative for blurred vision and double vision.  Respiratory: Positive for cough. Negative for shortness of breath and wheezing.   Cardiovascular: Negative for chest pain, palpitations and leg swelling.  Gastrointestinal: Positive for abdominal pain. Negative for nausea and vomiting.  Genitourinary: Negative for dysuria and urgency.  Musculoskeletal: Negative for back pain and neck pain.  Neurological: Negative for dizziness and headaches.  Psychiatric/Behavioral: Negative for depression. The patient is not nervous/anxious.     DRUG ALLERGIES:   Allergies  Allergen Reactions  . Aspirin Other (See Comments)    Other Reaction: steven-johnson reaction [name]  . Bee Venom Anaphylaxis  . Sulfa Antibiotics Swelling   VITALS:  Blood pressure 91/76, pulse 86, temperature 98.7 F (37.1 C), temperature source Oral, resp. rate 16, height 5\' 7"  (1.702 m), weight 78.6 kg, SpO2 90 %. PHYSICAL EXAMINATION:  Physical Exam  GENERAL:  57 y.o.-year-old patient lying in the bed with no acute distress.  EYES: Pupils equal, round, reactive to light and accommodation. No scleral icterus. Extraocular muscles intact.  HEENT: Head atraumatic, normocephalic. Oropharynx and nasopharynx clear. NECK:  Supple, no jugular venous distention. No thyroid enlargement, no tenderness.  LUNGS: CTAB. No use of accessory muscles of respiration.  CARDIOVASCULAR: RRR, S1, S2 normal. No murmurs, rubs, or gallops.  ABDOMEN: Soft,  nontender, nondistended. Bowel sounds present. No organomegaly or mass.  EXTREMITIES: No pedal edema,  cyanosis, or clubbing.  NEUROLOGIC: Cranial nerves II through XII are intact. + global weakness. Sensation intact. Gait not checked.  PSYCHIATRIC: The patient is alert and oriented x 3.  SKIN: No obvious rash, lesion, or ulcer.  LABORATORY PANEL:  Male CBC Recent Labs  Lab 08/28/18 0348  WBC 10.7*  HGB 12.5*  HCT 39.9  PLT 282   ------------------------------------------------------------------------------------------------------------------ Chemistries  Recent Labs  Lab 08/23/18 1610 08/23/18 2151  08/28/18 0348  NA 140  --    < > 138  K 3.1*  --    < > 4.2  CL 98  --    < > 107  CO2 29  --    < > 26  GLUCOSE 142*  --    < > 108*  BUN 20  --    < > 13  CREATININE 1.12  --    < > 0.87  CALCIUM 9.8  --    < > 8.7*  MG  --  2.0  --   --   AST 29  --   --   --   ALT 26  --   --   --   ALKPHOS 64  --   --   --   BILITOT 0.8  --   --   --    < > = values in this interval not displayed.   RADIOLOGY:  Dg Abd 1 View  Result Date: 08/27/2018 CLINICAL DATA:  Abdominal pain EXAM: ABDOMEN - 1 VIEW COMPARISON:  08/25/18 FINDINGS: Scattered large and small bowel gas is noted. The stomach is distended with air as the nasogastric catheter has been removed. Stable mild small bowel dilatation is noted. Scattered  calcifications are noted the left consistent with renal calculi. These are stable from recent CT examination. No ureteral stones are seen. No free air is noted. IMPRESSION: Stable small bowel dilatation. The stomach is distended with air as the nasogastric catheter has been removed. Electronically Signed   By: Alcide Clever M.D.   On: 08/27/2018 14:25   Dg Abd 2 Views  Result Date: 08/28/2018 CLINICAL DATA:  Small bowel obstruction, abdominal distention. EXAM: ABDOMEN - 2 VIEW COMPARISON:  Radiographs of August 27, 2018. FINDINGS: The bowel gas pattern is normal. There is no evidence of free air. No radio-opaque calculi or other significant radiographic abnormality is seen.  IMPRESSION: No evidence of bowel obstruction or ileus. Electronically Signed   By: Lupita Raider, M.D.   On: 08/28/2018 08:43   ASSESSMENT AND PLAN:   Acute recurrent small bowel obstruction with transition point- NG tube removed 2 days ago -Abdominal x-ray shows no further small bowel obstruction -Soft diet for today -Appreciate surgery input -I will give patient a suppository today   Mild COPD exacerbation-improving.  Stable on room air this morning.  -Continue duo nebs and albuterol -Continue home inhalers -Wean O2 as able  Hypertension- normotensive this morning. -Continue metoprolol at decreased dose -Hydralazine PRN  Chronic diastolic heart failure-no signs of acute exacerbation -Continue metoprolol at decreased dose  Tobacco abuse- current smoker -Nicotine patch -Tobacco cessation counseling provided this admission  Bipolar disorder-stable -Continue Cymbalta, Klonopin, Remeron, Geodon  History of DVT- INR 1.05 -Resume Coumadin -DVT prophylaxis with heparin  All the records are reviewed and case discussed with Care Management/Social Worker. Management plans discussed with the patient, family and they are in agreement.  CODE STATUS: Full Code  TOTAL TIME TAKING CARE OF THIS PATIENT: 35 minutes.   More than 50% of the time was spent in counseling/coordination of care: YES  POSSIBLE D/C IN 2-3 DAYS, DEPENDING ON CLINICAL CONDITION.   Auburn Bilberry M.D on 08/28/2018 at 1:24 PM  Between 7am to 6pm - Pager - [phone]  After 6pm go to [email or web address] - Social research officer, government  [facility]  Office  [phone]  CC: Primary care physician; Dortha Kern, MD  Note: This dictation was prepared with Dragon dictation along with smaller phrase technology. Any transcriptional errors that result from this process are unintentional.

## 2018-08-29 LAB — PROTIME-INR
INR: 0.96
Prothrombin Time: 12.7 seconds (ref 11.4–15.2)

## 2018-08-29 MED ORDER — POLYETHYLENE GLYCOL 3350 17 G PO PACK: 17.0000 g | PACK | Freq: Every day | ORAL | Status: DC

## 2018-08-29 MED ORDER — DOCUSATE SODIUM 100 MG PO CAPS: 100.0000 mg | ORAL_CAPSULE | Freq: Two times a day (BID) | ORAL | 11 refills | Status: AC

## 2018-08-29 MED ORDER — POLYETHYLENE GLYCOL 3350 17 G PO PACK: 17.0000 g | PACK | Freq: Every day | ORAL | 0 refills | Status: DC

## 2018-08-29 MED ORDER — WARFARIN SODIUM 6 MG PO TABS
6.0000 mg | ORAL_TABLET | Freq: Once | ORAL | Status: DC
  Filled 2018-08-29: qty 1

## 2018-08-29 NOTE — Progress Notes (Signed)
ANTICOAGULATION CONSULT NOTE  Pharmacy Consult for warfarin Indication: VTE prophylaxis/treatment  Allergies  Allergen Reactions  . Aspirin Other (See Comments)    Other Reaction: steven-johnson reaction [name]  . Bee Venom Anaphylaxis  . Sulfa Antibiotics Swelling    Patient Measurements: Height: 5\' 7"  (170.2 cm) Weight: 173 lb 4.5 oz (78.6 kg) IBW/kg (Calculated) : 66.1  Vital Signs: Temp: 97.7 F (36.5 C) (12/14 2312) Temp Source: Oral (12/14 2312) BP: 148/90 (12/14 2312) Pulse Rate: 81 (12/14 2312)  Labs: Recent Labs    08/27/18 0444 08/28/18 0348 08/29/18 0400  HGB 12.8* 12.5*  --   HCT 40.7 39.9  --   PLT 251 282  --   LABPROT 12.7 12.2 12.7  INR 0.96 0.91 0.96  CREATININE 0.90 0.87  --     Estimated Creatinine Clearance: 87.6 mL/min (by C-G formula based on SCr of 0.87 mg/dL).   Medical History: Past Medical History:  Diagnosis Date  . Allergy   . Anginal pain (HCC)   . Bipolar disorder (HCC)   . BPH (benign prostatic hypertrophy)   . CHF (congestive heart failure) (HCC)   . COPD (chronic obstructive pulmonary disease) (HCC)   . COPD exacerbation (HCC) 11/02/2015  . Coronary artery disease   . Deep vein thrombosis (DVT) (HCC)   . DJD (degenerative joint disease)   . Gastric ulcer   . GERD (gastroesophageal reflux disease)   . History of hiatal hernia   . Hypertension   . Hypothyroidism   . Kidney stones   . Lethargy 11/02/2015  . Neuromuscular disorder (HCC)   . PTSD (post-traumatic stress disorder)     Assessment: 57 year old male on warfarin PTA for history of DVT. Patient's home regimen is 4 mg daily. Warfarin has been on hold for r/o of possible surgical intervention. Pharmacy has been consulted to restart warfarin.  Per MD, will continue subq heparin and omit therapeutic bridging.  Date INR Dose 12/14 0.91 6mg  12/15   0.96       Goal of Therapy:  INR 2-3 Monitor platelets by anticoagulation protocol: Yes    Plan:  Will give one time dose of warfarin 6 mg tonight at 1800. INR ordered with morning labs.  , [facility] 08/29/2018 7:37 AM

## 2018-08-29 NOTE — Progress Notes (Signed)
Pt. Discharged to home via family vehicle. Discharge instructions and medication regimen reviewed at bedside with patient. Pt. verbalizes understanding of instructions and medication regimen. Prescriptions sent to pharmacy. Patient assessment unchanged from this morning. IV discontinued per policy.

## 2018-08-29 NOTE — Discharge Summary (Signed)
[facility] - [place] at [facility], 57 y.o., DOB 01-03-1961, MRN [id number]. Admission date: 08/23/2018 Discharge Date 08/29/2018 Primary MD Dortha Kern, MD Admitting Physician Bertrum Sol, MD  Admission Diagnosis  Small bowel obstruction [facility]) [K56.609] Hypoxia [R09.02]  Discharge Diagnosis   Active Problems: Acute recurrent SBO (small bowel obstruction) (HCC) Mild COPD exacerbation resolved Essential hypertension Chronic diastolic CHF Tobacco Bipolar disorder History of DVT      Hospital Course  Brett Fernandez  is a 57 y.o. male with a known history per below presenting with nausea, vomiting, loose stools for 3 days, associated with shortness of breath, O2 saturation 86 percent on room air, potassium was 3.1, urinalysis noted for ketones, white count 15,000, chest x-ray pending, CT abdomen noted for small bowel obstruction with transition point, general surgery notified per ED attending/Dr. Aleen Campi, patient evaluated in the emergency room, noted to be in mild distress, family at the bedside, patient is now been admitted for acute recurrent small bowel obstruction.  Patient was followed by surgery and he did not require any surgical intervention.  His small bowel is resolved.  Patient had a history of pulmonary embolism in 2016 and is on chronic Coumadin his Coumadin was held for possible surgery now is resumed.          Consults  general surgery  Significant Tests:  See full reports for all details     Dg Chest 1 View  Result Date: 08/23/2018 CLINICAL DATA:  Hypoxia EXAM: CHEST  1 VIEW COMPARISON:  01/03/2016 FINDINGS: Esophageal tube tip is in the left upper quadrant. Atelectasis at the left base. No acute consolidation or effusion. Normal heart size. No pneumothorax. Apical blebs. IMPRESSION: Mild subsegmental atelectasis at the left base. Electronically Signed   By: Jasmine Pang M.D.   On: 08/23/2018 20:24   Dg Abd 1  View  Result Date: 08/27/2018 CLINICAL DATA:  Abdominal pain EXAM: ABDOMEN - 1 VIEW COMPARISON:  08/25/18 FINDINGS: Scattered large and small bowel gas is noted. The stomach is distended with air as the nasogastric catheter has been removed. Stable mild small bowel dilatation is noted. Scattered calcifications are noted the left consistent with renal calculi. These are stable from recent CT examination. No ureteral stones are seen. No free air is noted. IMPRESSION: Stable small bowel dilatation. The stomach is distended with air as the nasogastric catheter has been removed. Electronically Signed   By: Alcide Clever M.D.   On: 08/27/2018 14:25   Dg Abdomen 1 View  Result Date: 08/23/2018 CLINICAL DATA:  Nasogastric tube placement. EXAM: ABDOMEN - 1 VIEW COMPARISON:  CT abdomen and pelvis August 23, 2018 FINDINGS: Nasogastric tube tip projects in proximal stomach, side-port at GE junction region. Multiple loops of gas distended small bowel, incompletely evaluated. LEFT lung base atelectasis/scarring. Soft tissue planes and included osseous structures are unchanged. IMPRESSION: Nasogastric tube tip projects in proximal stomach. Electronically Signed   By: Awilda Metro M.D.   On: 08/23/2018 19:54   Ct Abdomen Pelvis W Contrast  Result Date: 08/23/2018 CLINICAL DATA:  Nausea vomiting and diarrhea EXAM: CT ABDOMEN AND PELVIS WITH CONTRAST TECHNIQUE: Multidetector CT imaging of the abdomen and pelvis was performed using the standard protocol following bolus administration of intravenous contrast. CONTRAST:  ISOVUE-300 IOPAMIDOL (ISOVUE-300) INJECTION 61% COMPARISON:  CT 12/27/2017 FINDINGS: Lower chest: Mild atelectasis or scarring at the lingula. No acute consolidation or effusion. Stable pleural nodularity at the right central lung base. Heart size  within normal limits. Fat density at the hiatus with mild mass effect on the IVC. Hepatobiliary: No focal liver abnormality is seen. No gallstones,  gallbladder wall thickening, or biliary dilatation. Pancreas: Unremarkable. No pancreatic ductal dilatation or surrounding inflammatory changes. Spleen: Normal in size without focal abnormality. Adrenals/Urinary Tract: Adrenal glands are normal. No hydronephrosis. Multiple intrarenal stones bilaterally. The largest is seen on the left and measures up to 9 mm in size. There are cysts within the kidneys. The bladder is normal Stomach/Bowel: The stomach is slightly fluid-filled and dilated. Multiple loops of fluid-filled dilated small bowel measuring up to 3.9 cm. Transition points slightly to the right of midline near surgical sutures within the central abdomen. Additional transition to non dilated distal small bowel in the right lower quadrant, possible adhesion. No bowel wall thickening. Sigmoid colon diverticula without acute inflammatory change. Vascular/Lymphatic: Moderate aortic atherosclerosis. No aneurysm. No significantly enlarged lymph nodes. Reproductive: Negative Other: Negative for free air or free fluid. Small fat in the left inguinal canal. Musculoskeletal: No acute or significant osseous findings. IMPRESSION: 1. Multiple loops of dilated fluid-filled small bowel with transition points visualized within the right central abdomen and right lower quadrant, consistent with mechanical small bowel obstruction. Negative for free air or free fluid. 2. Multiple nonobstructing kidney stones 3. Descending and sigmoid colon diverticula without acute inflammatory change. Electronically Signed   By: Jasmine Pang M.D.   On: 08/23/2018 18:40   Dg Abd 2 Views  Result Date: 08/28/2018 CLINICAL DATA:  Small bowel obstruction, abdominal distention. EXAM: ABDOMEN - 2 VIEW COMPARISON:  Radiographs of August 27, 2018. FINDINGS: The bowel gas pattern is normal. There is no evidence of free air. No radio-opaque calculi or other significant radiographic abnormality is seen. IMPRESSION: No evidence of bowel obstruction  or ileus. Electronically Signed   By: Lupita Raider, M.D.   On: 08/28/2018 08:43   Dg Abd 2 Views  Result Date: 08/25/2018 CLINICAL DATA:  Small-bowel obstruction EXAM: ABDOMEN - 2 VIEW COMPARISON:  08/23/2018 CT abdomen and pelvis FINDINGS: Gas throughout colon to the sigmoid level. Few mildly dilated small bowel loops in abdomen. No bowel wall thickening or free air. Bibasilar atelectasis. Nasogastric tube tip is within the distal esophagus, with proximal side-port at the level of the inferior aspect of the aortic arch, recommend advancing tube 20 cm to place proximal side-port at stomach. No urinary tract calcification. IMPRESSION: Decreased small bowel distention since previous exam. Recommend advancing nasogastric 20 cm to place proximal side-port within the stomach. Findings called to [facility] RN on 08/25/2018 at 0954 hrs. Electronically Signed   By: Ulyses Southward M.D.   On: 08/25/2018 09:54       Today   Subjective:   Brett Fernandez patient denying any current symptoms  Objective:   Blood pressure 110/85, pulse (!) 103, temperature 98.5 F (36.9 C), resp. rate 16, height 5\' 7"  (1.702 m), weight 78.6 kg, SpO2 94 %.  .  Intake/Output Summary (Last 24 hours) at 08/29/2018 1503 Last data filed at 08/29/2018 1304 Gross per 24 hour  Intake 3008.27 ml  Output 600 ml  Net 2408.27 ml    Exam VITAL SIGNS: Blood pressure 110/85, pulse (!) 103, temperature 98.5 F (36.9 C), resp. rate 16, height 5\' 7"  (1.702 m), weight 78.6 kg, SpO2 94 %.  GENERAL:  57 y.o.-year-old patient lying in the bed with no acute distress.  EYES: Pupils equal, round, reactive to light and accommodation. No scleral icterus. Extraocular muscles intact.  HEENT:  Head atraumatic, normocephalic. Oropharynx and nasopharynx clear.  NECK:  Supple, no jugular venous distention. No thyroid enlargement, no tenderness.  LUNGS: Normal breath sounds bilaterally, no wheezing, rales,rhonchi or crepitation. No use of accessory  muscles of respiration.  CARDIOVASCULAR: S1, S2 normal. No murmurs, rubs, or gallops.  ABDOMEN: Soft, nontender, nondistended. Bowel sounds present. No organomegaly or mass.  EXTREMITIES: No pedal edema, cyanosis, or clubbing.  NEUROLOGIC: Cranial nerves II through XII are intact. Muscle strength 5/5 in all extremities. Sensation intact. Gait not checked.  PSYCHIATRIC: The patient is alert and oriented x 3.  SKIN: No obvious rash, lesion, or ulcer.   Data Review     CBC w Diff:  Lab Results  Component Value Date   WBC 10.7 (H) 08/28/2018   HGB 12.5 (L) 08/28/2018   HGB 10.0 (L) 10/13/2014   HCT 39.9 08/28/2018   HCT 30.3 (L) 10/13/2014   PLT 282 08/28/2018   PLT 144 (L) 10/13/2014   LYMPHOPCT 8 11/02/2015   LYMPHOPCT 12.9 10/13/2014   MONOPCT 8 11/02/2015   MONOPCT 12.8 10/13/2014   EOSPCT 3 11/02/2015   EOSPCT 1.2 10/13/2014   BASOPCT 0 11/02/2015   BASOPCT 0.5 10/13/2014   CMP:  Lab Results  Component Value Date   NA 138 08/28/2018   NA 141 10/13/2014   K 4.2 08/28/2018   K 4.1 10/13/2014   CL 107 08/28/2018   CL 109 (H) 10/13/2014   CO2 26 08/28/2018   CO2 27 10/13/2014   BUN 13 08/28/2018   BUN 8 10/13/2014   CREATININE 0.87 08/28/2018   CREATININE 1.04 10/13/2014   PROT 7.6 08/23/2018   ALBUMIN 4.2 08/23/2018   BILITOT 0.8 08/23/2018   ALKPHOS 64 08/23/2018   AST 29 08/23/2018   ALT 26 08/23/2018  .  Micro Results No results found for this or any previous visit (from the past 240 hour(s)).      Code Status Orders  (From admission, onward)         Start     Ordered   08/23/18 2133  Full code  Continuous     08/23/18 2132        Code Status History    This patient has a current code status but no historical code status.    Advance Directive Documentation     Most Recent Value  Type of Advance Directive  Healthcare Power of Attorney  Pre-existing out of facility DNR order (yellow form or pink MOST form)  -  "MOST" Form in Place?  -           Follow-up Information    Dortha Kern, MD Follow up in 6 day(s).   Specialty:  Family Medicine Contact information: [address] [place] [address] [phone]           Discharge Medications   Allergies as of 08/29/2018      Reactions   Aspirin Other (See Comments)   Other Reaction: steven-johnson reaction [name]   Bee Venom Anaphylaxis   Sulfa Antibiotics Swelling      Medication List    TAKE these medications   atomoxetine 40 MG capsule Commonly known as:  STRATTERA Take 80 mg by mouth daily.   docusate sodium 100 MG capsule Commonly known as:  COLACE Take 1 capsule (100 mg total) by mouth 2 (two) times daily.   DULoxetine 60 MG capsule Commonly known as:  CYMBALTA Take 120 mg by mouth daily.   finasteride 5 MG  tablet Commonly known as:  PROSCAR Take 5 mg by mouth daily.   fluticasone 50 MCG/ACT nasal spray Commonly known as:  FLONASE Place 2 sprays into both nostrils daily.   isosorbide mononitrate 30 MG 24 hr tablet Commonly known as:  IMDUR Take 30 mg by mouth daily.   levothyroxine 50 MCG tablet Commonly known as:  SYNTHROID, LEVOTHROID Take 50 mcg by mouth daily before breakfast.   loratadine 10 MG tablet Commonly known as:  CLARITIN Take 10 mg by mouth daily.   metoprolol succinate 50 MG 24 hr tablet Commonly known as:  TOPROL-XL Take 50 mg by mouth daily.   mirtazapine 15 MG tablet Commonly known as:  REMERON Take 15 mg by mouth daily.   nitroGLYCERIN 0.4 MG SL tablet Commonly known as:  NITROSTAT Place 0.4 mg under the tongue every 5 (five) minutes as needed for chest pain.   omeprazole 40 MG capsule Commonly known as:  PRILOSEC Take 40 mg by mouth daily.   oxyCODONE 5 MG immediate release tablet Commonly known as:  Oxy IR/ROXICODONE Take 1 tablet (5 mg total) by mouth every 6 (six) hours as needed for severe pain.   oxyCODONE 5 MG immediate release tablet Commonly known as:  Oxy  IR/ROXICODONE Take 1 tablet (5 mg total) by mouth 4 (four) times daily. Max: 4/day   oxyCODONE 5 MG immediate release tablet Commonly known as:  Oxy IR/ROXICODONE Take 1 tablet (5 mg total) by mouth 3 (three) times daily. Max: 3/day   oxyCODONE 5 MG immediate release tablet Commonly known as:  Oxy IR/ROXICODONE Take 1 tablet (5 mg total) by mouth 2 (two) times daily. Max: 2/day   oxyCODONE 5 MG immediate release tablet Commonly known as:  Oxy IR/ROXICODONE Take 1 tablet (5 mg total) by mouth daily. Max: 1/day   polyethylene glycol packet Commonly known as:  MIRALAX / GLYCOLAX Take 17 g by mouth daily.   potassium citrate 10 MEQ (1080 MG) SR tablet Commonly known as:  UROCIT-K Take 20 mEq by mouth 2 (two) times daily.   pravastatin 80 MG tablet Commonly known as:  PRAVACHOL Take 80 mg by mouth at bedtime.   pregabalin 75 MG capsule Commonly known as:  LYRICA Take 150 mg by mouth 2 (two) times daily.   pregabalin 75 MG capsule Commonly known as:  LYRICA Take 1 capsule (75 mg total) by mouth 3 (three) times daily.   tamsulosin 0.4 MG Caps capsule Commonly known as:  FLOMAX Take 0.4 mg by mouth daily.   traZODone 100 MG tablet Commonly known as:  DESYREL Take 300 mg by mouth at bedtime.   warfarin 4 MG tablet Commonly known as:  COUMADIN Take 4 mg by mouth every evening.   ziprasidone 60 MG capsule Commonly known as:  GEODON Take 60 mg by mouth 2 (two) times daily with a meal.          Total Time in preparing paper work, data evaluation and todays exam - 35 minutes  Auburn Bilberry M.D on 08/29/2018 at 3:03 PM [facility]   Office  [phone]

## 2018-08-29 NOTE — Progress Notes (Signed)
SURGICAL PROGRESS NOTE   Hospital Day(s): 6.   Post op day(s):  Marland Kitchen   Interval History: Patient seen and examined, no acute events or new complaints overnight. Patient reports tolerated the soft diet without pain. Refers feeling better today and had a large bowel movement. Denies nausea or vomiting.  Vital signs in last 24 hours: [min-max] current  Temp:  [97.7 F (36.5 C)-98.3 F (36.8 C)] 97.7 F (36.5 C) (12/14 2312) Pulse Rate:  [74-81] 81 (12/14 2312) Resp:  [18] 18 (12/14 2312) BP: (109-148)/(74-90) 148/90 (12/14 2312) SpO2:  [97 %] 97 % (12/14 2312)     Height: 5\' 7"  (170.2 cm) Weight: 78.6 kg BMI (Calculated): 27.13   Physical Exam:  Constitutional: alert, cooperative and no distress  Respiratory: breathing non-labored at rest  Cardiovascular: regular rate and sinus rhythm  Gastrointestinal: soft, non-tender, and non-distended  Labs:  CBC Latest Ref Rng & Units 08/28/2018 08/27/2018 08/26/2018  WBC 4.0 - 10.5 K/uL 10.7(H) 9.4 11.9(H)  Hemoglobin 13.0 - 17.0 g/dL 12.5(L) 12.8(L) 13.0  Hematocrit 39.0 - 52.0 % 39.9 40.7 40.8  Platelets 150 - 400 K/uL 282 251 246   CMP Latest Ref Rng & Units 08/28/2018 08/27/2018 08/26/2018  Glucose 70 - 99 mg/dL 14/08/2018) 440(H) 474(Q)  BUN 6 - 20 mg/dL 13 8 10   Creatinine 0.61 - 1.24 mg/dL 595(G 3.87  Sodium 135 - 145 mmol/L 138 137 139  Potassium 3.5 - 5.1 mmol/L 4.2 4.4 4.1  Chloride 98 - 111 mmol/L 107 109 109  CO2 22 - 32 mmol/L 26 25 24   Calcium 8.9 - 10.3 mg/dL 5.64) 3.32) )  Total Protein 6.5 - 8.1 g/dL - - -  Total Bilirubin 0.3 - 1.2 mg/dL - - -  Alkaline Phos 38 - 126 U/L - - -  AST 15 - 41 U/L - - -  ALT 0 - 44 U/L - - -    Imaging studies: No new pertinent imaging studies   Assessment/Plan:  A57 y.o.malewithresolving small bowel obstruction complicated by bipolar, CHF, COPD, CAD, history of DVT, HTN, Hypothyroidism, and tobacco abuse (smoking). Patient tolerated the soft diet and there is improved  of abdominal pian. Patient continue passing gas and had bowel movement. This are sign that bowel obstruction resolved. Continue with soft diet and advance as tolerated. May be discharged from surgery standpoint.   9.5(J, MD

## 2018-09-01 ENCOUNTER — Telehealth: Payer: Self-pay

## 2018-09-01 NOTE — Telephone Encounter (Signed)
EMMI Follow-up: Noted on the report that the patient had a question about his discharge paperwork and had some unfilled Rx's.  I talked with Brett Fernandez and he says he has a follow-up appointment on Monday with Dr. Quillian Quince and he would discuss his medications with her then and had not other questions. I let him know there would be another automated call in a day or two with a different series of questions and to let us know if he had any concerns at that time.

## 2019-08-16 ENCOUNTER — Emergency Department
Admission: EM | Admit: 2019-08-16 | Discharge: 2019-08-16 | Disposition: A | Discharge status: Home / Self Care | Payer: Medicaid Other | Attending: Student | Admitting: Student

## 2019-08-16 ENCOUNTER — Other Ambulatory Visit: Payer: Self-pay

## 2019-08-16 ENCOUNTER — Emergency Department: Payer: Medicaid Other

## 2019-08-16 ENCOUNTER — Encounter: Payer: Self-pay | Admitting: Emergency Medicine

## 2019-08-16 DIAGNOSIS — Y939 Activity, unspecified: Secondary | ICD-10-CM | POA: Diagnosis not present

## 2019-08-16 DIAGNOSIS — S299XXA Unspecified injury of thorax, initial encounter: Secondary | ICD-10-CM | POA: Diagnosis present

## 2019-08-16 DIAGNOSIS — Z79899 Other long term (current) drug therapy: Secondary | ICD-10-CM | POA: Diagnosis not present

## 2019-08-16 DIAGNOSIS — J449 Chronic obstructive pulmonary disease, unspecified: Secondary | ICD-10-CM | POA: Diagnosis not present

## 2019-08-16 DIAGNOSIS — I509 Heart failure, unspecified: Secondary | ICD-10-CM | POA: Insufficient documentation

## 2019-08-16 DIAGNOSIS — S2241XA Multiple fractures of ribs, right side, initial encounter for closed fracture: Secondary | ICD-10-CM | POA: Diagnosis not present

## 2019-08-16 DIAGNOSIS — W01198A Fall on same level from slipping, tripping and stumbling with subsequent striking against other object, initial encounter: Secondary | ICD-10-CM | POA: Insufficient documentation

## 2019-08-16 DIAGNOSIS — Z96651 Presence of right artificial knee joint: Secondary | ICD-10-CM | POA: Diagnosis not present

## 2019-08-16 DIAGNOSIS — I110 Hypertensive heart disease with heart failure: Secondary | ICD-10-CM | POA: Insufficient documentation

## 2019-08-16 DIAGNOSIS — I2510 Atherosclerotic heart disease of native coronary artery without angina pectoris: Secondary | ICD-10-CM | POA: Insufficient documentation

## 2019-08-16 DIAGNOSIS — F17210 Nicotine dependence, cigarettes, uncomplicated: Secondary | ICD-10-CM | POA: Insufficient documentation

## 2019-08-16 DIAGNOSIS — Y999 Unspecified external cause status: Secondary | ICD-10-CM | POA: Diagnosis not present

## 2019-08-16 DIAGNOSIS — W19XXXA Unspecified fall, initial encounter: Secondary | ICD-10-CM

## 2019-08-16 DIAGNOSIS — E039 Hypothyroidism, unspecified: Secondary | ICD-10-CM | POA: Insufficient documentation

## 2019-08-16 DIAGNOSIS — Y929 Unspecified place or not applicable: Secondary | ICD-10-CM | POA: Insufficient documentation

## 2019-08-16 MED ORDER — MELOXICAM 15 MG PO TABS: 15.0000 mg | ORAL_TABLET | Freq: Every day | ORAL | 0 refills | Status: DC

## 2019-08-16 MED ORDER — HYDROCODONE-ACETAMINOPHEN 5-325 MG PO TABS: 1.0000 | ORAL_TABLET | ORAL | 0 refills | Status: DC | PRN

## 2019-08-16 MED ORDER — OXYCODONE-ACETAMINOPHEN 5-325 MG PO TABS
1.0000 | ORAL_TABLET | Freq: Once | ORAL | Status: AC
  Administered 2019-08-16: 1 via ORAL
  Filled 2019-08-16: qty 1

## 2019-08-16 NOTE — ED Triage Notes (Signed)
Pt arrived via POV with reports of right side rib cage pain states he fell out of a pick up truck last Wednesday. PT states he got on the bumper to lower the truck down to hook up to a trailer, pt states he fell to the cement block on his right side.  States painful to take in deep breath. Pt states he is taking tylenol and using ice and lidocaine patches without relief.
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
## 2019-08-16 NOTE — ED Notes (Signed)
Pt provided coffee per his request.  

## 2019-08-17 NOTE — ED Notes (Signed)
Pharmacy called to clarify meloxicam rx as patient is on coumadin.  Per dr Jimmye Norman cancel prescription for meloxicam.

## 2019-08-20 ENCOUNTER — Encounter: Payer: Self-pay | Admitting: Emergency Medicine

## 2019-08-20 ENCOUNTER — Other Ambulatory Visit: Payer: Self-pay

## 2019-08-20 ENCOUNTER — Emergency Department
Admission: EM | Admit: 2019-08-20 | Discharge: 2019-08-20 | Disposition: A | Discharge status: Home / Self Care | Payer: Medicaid Other | Attending: Emergency Medicine | Admitting: Emergency Medicine

## 2019-08-20 DIAGNOSIS — I509 Heart failure, unspecified: Secondary | ICD-10-CM | POA: Diagnosis not present

## 2019-08-20 DIAGNOSIS — I110 Hypertensive heart disease with heart failure: Secondary | ICD-10-CM | POA: Diagnosis not present

## 2019-08-20 DIAGNOSIS — Z760 Encounter for issue of repeat prescription: Secondary | ICD-10-CM | POA: Insufficient documentation

## 2019-08-20 DIAGNOSIS — M069 Rheumatoid arthritis, unspecified: Secondary | ICD-10-CM | POA: Diagnosis not present

## 2019-08-20 DIAGNOSIS — I2510 Atherosclerotic heart disease of native coronary artery without angina pectoris: Secondary | ICD-10-CM | POA: Insufficient documentation

## 2019-08-20 DIAGNOSIS — I252 Old myocardial infarction: Secondary | ICD-10-CM | POA: Diagnosis not present

## 2019-08-20 DIAGNOSIS — J449 Chronic obstructive pulmonary disease, unspecified: Secondary | ICD-10-CM | POA: Diagnosis not present

## 2019-08-20 DIAGNOSIS — E039 Hypothyroidism, unspecified: Secondary | ICD-10-CM | POA: Diagnosis not present

## 2019-08-20 DIAGNOSIS — Z7901 Long term (current) use of anticoagulants: Secondary | ICD-10-CM | POA: Diagnosis not present

## 2019-08-20 DIAGNOSIS — G35 Multiple sclerosis: Secondary | ICD-10-CM | POA: Insufficient documentation

## 2019-08-20 DIAGNOSIS — F17210 Nicotine dependence, cigarettes, uncomplicated: Secondary | ICD-10-CM | POA: Insufficient documentation

## 2019-08-20 DIAGNOSIS — Z79899 Other long term (current) drug therapy: Secondary | ICD-10-CM | POA: Diagnosis not present

## 2019-08-20 MED ORDER — ONDANSETRON HCL 4 MG PO TABS: 4.0000 mg | ORAL_TABLET | Freq: Three times a day (TID) | ORAL | 0 refills | Status: AC | PRN

## 2019-08-20 MED ORDER — HYDROCODONE-ACETAMINOPHEN 5-325 MG PO TABS: 1.0000 | ORAL_TABLET | Freq: Four times a day (QID) | ORAL | 0 refills | Status: AC | PRN

## 2019-08-20 MED ORDER — OXYCODONE-ACETAMINOPHEN 5-325 MG PO TABS
1.0000 | ORAL_TABLET | Freq: Once | ORAL | Status: AC
  Administered 2019-08-20: 1 via ORAL
  Filled 2019-08-20: qty 1

## 2019-08-20 MED ORDER — ONDANSETRON 4 MG PO TBDP
4.0000 mg | ORAL_TABLET | Freq: Once | ORAL | Status: AC
  Administered 2019-08-20: 4 mg via ORAL
  Filled 2019-08-20: qty 1
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
## 2019-08-20 NOTE — ED Notes (Signed)
Pt reports he is out of the pain medication prescribed last week after he fell and broke ribs. Pt states symptoms no worse but still having pain. Would like more pain medication to hold him over until he sees his PCP Monday afternoon.

## 2019-08-20 NOTE — ED Triage Notes (Signed)
Patient ambulatory to triage with complaints of pain in right flank since fall on 25 Nov and "breaking two ribs".  Pt requesting additional pain meds d/t multiple labors at home.  Constant Sharp pain.  Pt reports cough at home making pain worse.  Last tylenol at 1800.  Pt reports being advised by [facility] case worker to come to ED or urgent care.  Pt reports PCP visit is scheduled for Monday.  Speaking in complete coherent sentences. No acute breathing distress noted.

## 2019-09-05 ENCOUNTER — Encounter: Payer: Self-pay | Admitting: Emergency Medicine

## 2019-09-05 ENCOUNTER — Inpatient Hospital Stay
Admission: EM | Admit: 2019-09-05 | Discharge: 2019-09-12 | DRG: 389 | Disposition: A | Discharge status: Home / Self Care | Payer: Medicaid Other | Attending: Internal Medicine | Admitting: Internal Medicine

## 2019-09-05 ENCOUNTER — Emergency Department: Payer: Medicaid Other

## 2019-09-05 ENCOUNTER — Other Ambulatory Visit: Payer: Self-pay

## 2019-09-05 DIAGNOSIS — K56600 Partial intestinal obstruction, unspecified as to cause: Principal | ICD-10-CM | POA: Diagnosis present

## 2019-09-05 DIAGNOSIS — Z833 Family history of diabetes mellitus: Secondary | ICD-10-CM

## 2019-09-05 DIAGNOSIS — Z7901 Long term (current) use of anticoagulants: Secondary | ICD-10-CM

## 2019-09-05 DIAGNOSIS — R6510 Systemic inflammatory response syndrome (SIRS) of non-infectious origin without acute organ dysfunction: Secondary | ICD-10-CM | POA: Diagnosis present

## 2019-09-05 DIAGNOSIS — M797 Fibromyalgia: Secondary | ICD-10-CM | POA: Diagnosis present

## 2019-09-05 DIAGNOSIS — K567 Ileus, unspecified: Secondary | ICD-10-CM | POA: Diagnosis present

## 2019-09-05 DIAGNOSIS — Z915 Personal history of self-harm: Secondary | ICD-10-CM

## 2019-09-05 DIAGNOSIS — K219 Gastro-esophageal reflux disease without esophagitis: Secondary | ICD-10-CM | POA: Diagnosis present

## 2019-09-05 DIAGNOSIS — I82409 Acute embolism and thrombosis of unspecified deep veins of unspecified lower extremity: Secondary | ICD-10-CM

## 2019-09-05 DIAGNOSIS — R1110 Vomiting, unspecified: Secondary | ICD-10-CM | POA: Diagnosis present

## 2019-09-05 DIAGNOSIS — E785 Hyperlipidemia, unspecified: Secondary | ICD-10-CM | POA: Diagnosis present

## 2019-09-05 DIAGNOSIS — E162 Hypoglycemia, unspecified: Secondary | ICD-10-CM | POA: Diagnosis not present

## 2019-09-05 DIAGNOSIS — J449 Chronic obstructive pulmonary disease, unspecified: Secondary | ICD-10-CM | POA: Diagnosis present

## 2019-09-05 DIAGNOSIS — I110 Hypertensive heart disease with heart failure: Secondary | ICD-10-CM | POA: Diagnosis present

## 2019-09-05 DIAGNOSIS — Z20828 Contact with and (suspected) exposure to other viral communicable diseases: Secondary | ICD-10-CM | POA: Diagnosis present

## 2019-09-05 DIAGNOSIS — I2510 Atherosclerotic heart disease of native coronary artery without angina pectoris: Secondary | ICD-10-CM | POA: Diagnosis present

## 2019-09-05 DIAGNOSIS — I252 Old myocardial infarction: Secondary | ICD-10-CM | POA: Diagnosis not present

## 2019-09-05 DIAGNOSIS — Z79891 Long term (current) use of opiate analgesic: Secondary | ICD-10-CM

## 2019-09-05 DIAGNOSIS — N400 Enlarged prostate without lower urinary tract symptoms: Secondary | ICD-10-CM | POA: Diagnosis present

## 2019-09-05 DIAGNOSIS — I700 Atherosclerosis of aorta: Secondary | ICD-10-CM | POA: Diagnosis present

## 2019-09-05 DIAGNOSIS — Z825 Family history of asthma and other chronic lower respiratory diseases: Secondary | ICD-10-CM

## 2019-09-05 DIAGNOSIS — G894 Chronic pain syndrome: Secondary | ICD-10-CM | POA: Diagnosis present

## 2019-09-05 DIAGNOSIS — K5730 Diverticulosis of large intestine without perforation or abscess without bleeding: Secondary | ICD-10-CM | POA: Diagnosis present

## 2019-09-05 DIAGNOSIS — K589 Irritable bowel syndrome without diarrhea: Secondary | ICD-10-CM | POA: Diagnosis present

## 2019-09-05 DIAGNOSIS — F329 Major depressive disorder, single episode, unspecified: Secondary | ICD-10-CM | POA: Diagnosis present

## 2019-09-05 DIAGNOSIS — I10 Essential (primary) hypertension: Secondary | ICD-10-CM

## 2019-09-05 DIAGNOSIS — Z9189 Other specified personal risk factors, not elsewhere classified: Secondary | ICD-10-CM | POA: Diagnosis not present

## 2019-09-05 DIAGNOSIS — Z8673 Personal history of transient ischemic attack (TIA), and cerebral infarction without residual deficits: Secondary | ICD-10-CM

## 2019-09-05 DIAGNOSIS — G35 Multiple sclerosis: Secondary | ICD-10-CM | POA: Diagnosis present

## 2019-09-05 DIAGNOSIS — E039 Hypothyroidism, unspecified: Secondary | ICD-10-CM | POA: Diagnosis present

## 2019-09-05 DIAGNOSIS — D869 Sarcoidosis, unspecified: Secondary | ICD-10-CM | POA: Diagnosis present

## 2019-09-05 DIAGNOSIS — F17210 Nicotine dependence, cigarettes, uncomplicated: Secondary | ICD-10-CM | POA: Diagnosis present

## 2019-09-05 DIAGNOSIS — Z882 Allergy status to sulfonamides status: Secondary | ICD-10-CM

## 2019-09-05 DIAGNOSIS — N200 Calculus of kidney: Secondary | ICD-10-CM | POA: Diagnosis present

## 2019-09-05 DIAGNOSIS — Z86010 Personal history of colonic polyps: Secondary | ICD-10-CM

## 2019-09-05 DIAGNOSIS — Z886 Allergy status to analgesic agent status: Secondary | ICD-10-CM

## 2019-09-05 DIAGNOSIS — Z791 Long term (current) use of non-steroidal anti-inflammatories (NSAID): Secondary | ICD-10-CM

## 2019-09-05 DIAGNOSIS — G629 Polyneuropathy, unspecified: Secondary | ICD-10-CM | POA: Diagnosis present

## 2019-09-05 DIAGNOSIS — Z8711 Personal history of peptic ulcer disease: Secondary | ICD-10-CM

## 2019-09-05 DIAGNOSIS — R7301 Impaired fasting glucose: Secondary | ICD-10-CM

## 2019-09-05 DIAGNOSIS — R197 Diarrhea, unspecified: Secondary | ICD-10-CM | POA: Diagnosis not present

## 2019-09-05 DIAGNOSIS — Z79899 Other long term (current) drug therapy: Secondary | ICD-10-CM

## 2019-09-05 DIAGNOSIS — Z87442 Personal history of urinary calculi: Secondary | ICD-10-CM

## 2019-09-05 DIAGNOSIS — Z86718 Personal history of other venous thrombosis and embolism: Secondary | ICD-10-CM

## 2019-09-05 DIAGNOSIS — F319 Bipolar disorder, unspecified: Secondary | ICD-10-CM | POA: Diagnosis present

## 2019-09-05 DIAGNOSIS — Z79890 Hormone replacement therapy (postmenopausal): Secondary | ICD-10-CM

## 2019-09-05 DIAGNOSIS — Z818 Family history of other mental and behavioral disorders: Secondary | ICD-10-CM

## 2019-09-05 DIAGNOSIS — K56609 Unspecified intestinal obstruction, unspecified as to partial versus complete obstruction: Secondary | ICD-10-CM | POA: Diagnosis not present

## 2019-09-05 DIAGNOSIS — I509 Heart failure, unspecified: Secondary | ICD-10-CM | POA: Diagnosis present

## 2019-09-05 DIAGNOSIS — M0589 Other rheumatoid arthritis with rheumatoid factor of multiple sites: Secondary | ICD-10-CM | POA: Diagnosis present

## 2019-09-05 DIAGNOSIS — R112 Nausea with vomiting, unspecified: Secondary | ICD-10-CM

## 2019-09-05 DIAGNOSIS — M4728 Other spondylosis with radiculopathy, sacral and sacrococcygeal region: Secondary | ICD-10-CM | POA: Diagnosis present

## 2019-09-05 DIAGNOSIS — J42 Unspecified chronic bronchitis: Secondary | ICD-10-CM | POA: Diagnosis present

## 2019-09-05 DIAGNOSIS — Z62819 Personal history of unspecified abuse in childhood: Secondary | ICD-10-CM | POA: Diagnosis present

## 2019-09-05 DIAGNOSIS — I272 Pulmonary hypertension, unspecified: Secondary | ICD-10-CM | POA: Diagnosis present

## 2019-09-05 DIAGNOSIS — F411 Generalized anxiety disorder: Secondary | ICD-10-CM | POA: Diagnosis present

## 2019-09-05 DIAGNOSIS — Z8249 Family history of ischemic heart disease and other diseases of the circulatory system: Secondary | ICD-10-CM

## 2019-09-05 DIAGNOSIS — Z96653 Presence of artificial knee joint, bilateral: Secondary | ICD-10-CM | POA: Diagnosis present

## 2019-09-05 DIAGNOSIS — F32A Depression, unspecified: Secondary | ICD-10-CM | POA: Diagnosis present

## 2019-09-05 DIAGNOSIS — Z9049 Acquired absence of other specified parts of digestive tract: Secondary | ICD-10-CM

## 2019-09-05 LAB — COMPREHENSIVE METABOLIC PANEL
ALT: 22 U/L (ref 0–44)
AST: 23 U/L (ref 15–41)
Albumin: 4.6 g/dL (ref 3.5–5.0)
Alkaline Phosphatase: 87 U/L (ref 38–126)
Anion gap: 18 — ABNORMAL HIGH (ref 5–15)
BUN: 13 mg/dL (ref 6–20)
CO2: 20 mmol/L — ABNORMAL LOW (ref 22–32)
Calcium: 10.3 mg/dL (ref 8.9–10.3)
Chloride: 101 mmol/L (ref 98–111)
Creatinine, Ser: 1.2 mg/dL (ref 0.61–1.24)
GFR calc Af Amer: >60 mL/min (ref >60)
GFR calc non Af Amer: >60 mL/min (ref >60)
Glucose, Bld: 133 mg/dL — ABNORMAL HIGH (ref 70–99)
Potassium: 4.1 mmol/L (ref 3.5–5.1)
Sodium: 139 mmol/L (ref 135–145)
Total Bilirubin: 1 mg/dL (ref 0.3–1.2)
Total Protein: 9.2 g/dL — ABNORMAL HIGH (ref 6.5–8.1)

## 2019-09-05 LAB — URINALYSIS, COMPLETE (UACMP) WITH MICROSCOPIC
Bilirubin Urine: NEGATIVE
Glucose, UA: NEGATIVE mg/dL
Ketones, ur: 20 mg/dL — AB
Leukocytes,Ua: NEGATIVE
Nitrite: NEGATIVE
Protein, ur: 100 mg/dL — AB
Specific Gravity, Urine: 1.021 (ref 1.005–1.030)
pH: 6 (ref 5.0–8.0)

## 2019-09-05 LAB — LIPASE, BLOOD: Lipase: 25 U/L (ref 11–51)

## 2019-09-05 LAB — CBC
HCT: 56 % — ABNORMAL HIGH (ref 39.0–52.0)
Hemoglobin: 18.6 g/dL — ABNORMAL HIGH (ref 13.0–17.0)
MCH: 31.3 pg (ref 26.0–34.0)
MCHC: 33.2 g/dL (ref 30.0–36.0)
MCV: 94.3 fL (ref 80.0–100.0)
Platelets: 271 10*3/uL (ref 150–400)
RBC: 5.94 MIL/uL — ABNORMAL HIGH (ref 4.22–5.81)
RDW: 13.2 % (ref 11.5–15.5)
WBC: 10.7 10*3/uL — ABNORMAL HIGH (ref 4.0–10.5)
nRBC: 0 % (ref 0.0–0.2)

## 2019-09-05 LAB — RESPIRATORY PANEL BY RT PCR (FLU A&B, COVID)
Influenza A by PCR: NEGATIVE
Influenza B by PCR: NEGATIVE
SARS Coronavirus 2 by RT PCR: NEGATIVE

## 2019-09-05 LAB — POC SARS CORONAVIRUS 2 AG: SARS Coronavirus 2 Ag: NEGATIVE

## 2019-09-05 MED ORDER — SODIUM CHLORIDE 0.9% FLUSH: 3.0000 mL | Freq: Once | INTRAVENOUS | Status: DC

## 2019-09-05 MED ORDER — PANTOPRAZOLE SODIUM 40 MG PO TBEC
40.0000 mg | DELAYED_RELEASE_TABLET | Freq: Every day | ORAL | Status: DC
  Administered 2019-09-06 – 2019-09-12 (×7): 40 mg via ORAL
  Filled 2019-09-05 (×7): qty 1

## 2019-09-05 MED ORDER — ONDANSETRON HCL 4 MG/2ML IJ SOLN
4.0000 mg | Freq: Four times a day (QID) | INTRAMUSCULAR | Status: DC | PRN
  Administered 2019-09-06: 4 mg via INTRAVENOUS
  Filled 2019-09-05: qty 2

## 2019-09-05 MED ORDER — ONDANSETRON HCL 4 MG PO TABS
4.0000 mg | ORAL_TABLET | Freq: Four times a day (QID) | ORAL | Status: DC | PRN
  Filled 2019-09-05: qty 1

## 2019-09-05 MED ORDER — FENTANYL CITRATE (PF) 100 MCG/2ML IJ SOLN
50.0000 ug | Freq: Once | INTRAMUSCULAR | Status: AC
  Administered 2019-09-05: 22:00:00 50 ug via INTRAVENOUS
  Filled 2019-09-05: qty 2

## 2019-09-05 MED ORDER — SILDENAFIL CITRATE 20 MG PO TABS
60.0000 mg | ORAL_TABLET | Freq: Three times a day (TID) | ORAL | Status: DC
  Administered 2019-09-06: 60 mg via ORAL
  Filled 2019-09-05 (×2): qty 3

## 2019-09-05 MED ORDER — NITROGLYCERIN 0.4 MG SL SUBL: 0.4000 mg | SUBLINGUAL_TABLET | SUBLINGUAL | Status: DC | PRN

## 2019-09-05 MED ORDER — ATOMOXETINE HCL 60 MG PO CAPS
80.0000 mg | ORAL_CAPSULE | Freq: Every day | ORAL | Status: DC
  Administered 2019-09-06 – 2019-09-12 (×7): 80 mg via ORAL
  Filled 2019-09-05 (×7): qty 2

## 2019-09-05 MED ORDER — ZIPRASIDONE HCL 40 MG PO CAPS
60.0000 mg | ORAL_CAPSULE | Freq: Two times a day (BID) | ORAL | Status: DC
  Administered 2019-09-06 – 2019-09-12 (×13): 60 mg via ORAL
  Filled 2019-09-05 (×16): qty 1

## 2019-09-05 MED ORDER — ACETAMINOPHEN 325 MG PO TABS
650.0000 mg | ORAL_TABLET | Freq: Four times a day (QID) | ORAL | Status: DC | PRN
  Administered 2019-09-06 – 2019-09-08 (×3): 650 mg via ORAL
  Filled 2019-09-05 (×3): qty 2

## 2019-09-05 MED ORDER — DULOXETINE HCL 30 MG PO CPEP
120.0000 mg | ORAL_CAPSULE | Freq: Every day | ORAL | Status: DC
  Administered 2019-09-06 – 2019-09-12 (×7): 120 mg via ORAL
  Filled 2019-09-05 (×6): qty 4
  Filled 2019-09-05: qty 2
  Filled 2019-09-05: qty 4

## 2019-09-05 MED ORDER — PRAVASTATIN SODIUM 20 MG PO TABS
80.0000 mg | ORAL_TABLET | Freq: Every day | ORAL | Status: DC
  Administered 2019-09-06 – 2019-09-11 (×6): 80 mg via ORAL
  Filled 2019-09-05: qty 2
  Filled 2019-09-05 (×6): qty 4

## 2019-09-05 MED ORDER — SODIUM CHLORIDE 0.9 % IV SOLN: INTRAVENOUS | Status: DC

## 2019-09-05 MED ORDER — MIRTAZAPINE 15 MG PO TABS
15.0000 mg | ORAL_TABLET | Freq: Every day | ORAL | Status: DC
  Administered 2019-09-06 – 2019-09-12 (×7): 15 mg via ORAL
  Filled 2019-09-05 (×8): qty 1

## 2019-09-05 MED ORDER — TAMSULOSIN HCL 0.4 MG PO CAPS
0.4000 mg | ORAL_CAPSULE | Freq: Every day | ORAL | Status: DC
  Administered 2019-09-06 – 2019-09-12 (×7): 0.4 mg via ORAL
  Filled 2019-09-05 (×8): qty 1

## 2019-09-05 MED ORDER — WARFARIN SODIUM 4 MG PO TABS: 4.0000 mg | ORAL_TABLET | Freq: Every evening | ORAL | Status: DC

## 2019-09-05 MED ORDER — TRAZODONE HCL 100 MG PO TABS
300.0000 mg | ORAL_TABLET | Freq: Every day | ORAL | Status: DC
  Administered 2019-09-06 – 2019-09-11 (×7): 300 mg via ORAL
  Filled 2019-09-05 (×7): qty 3

## 2019-09-05 MED ORDER — IOHEXOL 300 MG/ML  SOLN
100.0000 mL | Freq: Once | INTRAMUSCULAR | Status: AC | PRN
  Administered 2019-09-05: 100 mL via INTRAVENOUS

## 2019-09-05 MED ORDER — ACETAMINOPHEN 650 MG RE SUPP: 650.0000 mg | Freq: Four times a day (QID) | RECTAL | Status: DC | PRN

## 2019-09-05 MED ORDER — LEVOTHYROXINE SODIUM 50 MCG PO TABS
50.0000 ug | ORAL_TABLET | Freq: Every day | ORAL | Status: DC
  Administered 2019-09-06 – 2019-09-12 (×7): 50 ug via ORAL
  Filled 2019-09-05 (×8): qty 1

## 2019-09-05 MED ORDER — ONDANSETRON HCL 4 MG/2ML IJ SOLN
4.0000 mg | Freq: Once | INTRAMUSCULAR | Status: AC
  Administered 2019-09-05: 4 mg via INTRAVENOUS
  Filled 2019-09-05: qty 2

## 2019-09-05 MED ORDER — ISOSORBIDE MONONITRATE ER 30 MG PO TB24
30.0000 mg | ORAL_TABLET | Freq: Every day | ORAL | Status: DC
  Administered 2019-09-06 – 2019-09-12 (×7): 30 mg via ORAL
  Filled 2019-09-05 (×8): qty 1

## 2019-09-05 MED ORDER — PREGABALIN 75 MG PO CAPS
300.0000 mg | ORAL_CAPSULE | Freq: Two times a day (BID) | ORAL | Status: DC
  Administered 2019-09-06 – 2019-09-12 (×14): 300 mg via ORAL
  Filled 2019-09-05 (×3): qty 4
  Filled 2019-09-05: qty 6
  Filled 2019-09-05 (×10): qty 4

## 2019-09-05 MED ORDER — MAGNESIUM HYDROXIDE 400 MG/5ML PO SUSP: 30.0000 mL | Freq: Every day | ORAL | Status: DC | PRN

## 2019-09-05 MED ORDER — POLYETHYLENE GLYCOL 3350 17 G PO PACK
17.0000 g | PACK | Freq: Every day | ORAL | Status: DC
  Administered 2019-09-06 – 2019-09-10 (×5): 17 g via ORAL
  Filled 2019-09-05 (×6): qty 1

## 2019-09-05 MED ORDER — LORATADINE 10 MG PO TABS
10.0000 mg | ORAL_TABLET | Freq: Every day | ORAL | Status: DC
  Administered 2019-09-06 – 2019-09-12 (×7): 10 mg via ORAL
  Filled 2019-09-05 (×7): qty 1

## 2019-09-05 MED ORDER — ENOXAPARIN SODIUM 40 MG/0.4ML ~~LOC~~ SOLN: 40.0000 mg | SUBCUTANEOUS | Status: DC

## 2019-09-05 MED ORDER — FINASTERIDE 5 MG PO TABS
5.0000 mg | ORAL_TABLET | Freq: Every day | ORAL | Status: DC
  Administered 2019-09-06 – 2019-09-12 (×7): 5 mg via ORAL
  Filled 2019-09-05 (×7): qty 1

## 2019-09-05 MED ORDER — METOPROLOL SUCCINATE ER 50 MG PO TB24
50.0000 mg | ORAL_TABLET | Freq: Every day | ORAL | Status: DC
  Administered 2019-09-06 – 2019-09-12 (×7): 50 mg via ORAL
  Filled 2019-09-05: qty 2
  Filled 2019-09-05 (×7): qty 1

## 2019-09-05 MED ORDER — ONDANSETRON 4 MG PO TBDP: 4.0000 mg | ORAL_TABLET | Freq: Once | ORAL | Status: DC | PRN

## 2019-09-05 MED ORDER — TRAZODONE HCL 50 MG PO TABS
25.0000 mg | ORAL_TABLET | Freq: Every evening | ORAL | Status: DC | PRN
  Filled 2019-09-05: qty 0.5

## 2019-09-05 MED ORDER — POTASSIUM CITRATE ER 10 MEQ (1080 MG) PO TBCR
20.0000 meq | EXTENDED_RELEASE_TABLET | Freq: Two times a day (BID) | ORAL | Status: DC
  Administered 2019-09-06 – 2019-09-09 (×8): 20 meq via ORAL
  Filled 2019-09-05 (×11): qty 2

## 2019-09-05 MED ORDER — FLUTICASONE PROPIONATE 50 MCG/ACT NA SUSP
2.0000 | Freq: Every day | NASAL | Status: DC
  Administered 2019-09-06 – 2019-09-12 (×7): 2 via NASAL
  Filled 2019-09-05: qty 16

## 2019-09-05 MED ORDER — METOCLOPRAMIDE HCL 5 MG/ML IJ SOLN: 10.0000 mg | Freq: Four times a day (QID) | INTRAMUSCULAR | Status: DC | PRN

## 2019-09-05 MED ORDER — SODIUM CHLORIDE 0.9 % IV BOLUS
1000.0000 mL | Freq: Once | INTRAVENOUS | Status: AC
  Administered 2019-09-05: 20:00:00 1000 mL via INTRAVENOUS

## 2019-09-05 NOTE — ED Notes (Signed)
Pt from home with n/v/d x 2 days. Pt states he has had numerous bouts of diarrhea and emesis today. Pt states he has CHF, COPD, and has not been able to take his meds for three days because he can't keep water down. Pt alert & oriented, sweaty during assessment.

## 2019-09-05 NOTE — ED Triage Notes (Signed)
Patient presents to the ED with nausea, vomiting and diarrhea x 3 days.  Patient also reports fever, cough, shortness of breath and loss of taste.  Patient denies any known covid19 contacts.  Patient reports vomiting >10 times today and diarrhea approx. 6 times today.  Patient dry heaving in triage.

## 2019-09-05 NOTE — ED Notes (Signed)
Pt returned from xray

## 2019-09-05 NOTE — ED Notes (Signed)
Pt to CT

## 2019-09-05 NOTE — ED Notes (Signed)
Pt incontinent of loose stool. Assisted to restroom and provided with wet wipes and new undergarments. Tolerated well.
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
## 2019-09-05 NOTE — H&P (Addendum)
[place] at [facility]   PATIENT NAME: Brett Fernandez    MR#:  [id number]  DATE OF BIRTH:  [birth year]  DATE OF ADMISSION:  09/05/2019  PRIMARY CARE PHYSICIAN: Dortha Kern, MD   REQUESTING/REFERRING PHYSICIAN: Phineas Semen, MD CHIEF COMPLAINT:   Chief Complaint  Patient presents with  . Emesis  . Fever    HISTORY OF PRESENT ILLNESS:  Brett Fernandez  is a 58 y.o. Caucasian male with a known history of CHF, BPH, COPD, GERD and peptic ulcer disease as well as hypertension and hypothyroidism, presented to the emergency room with acute onset of diarrhea that started 4 days ago as well as nausea and vomiting that started yesterday with associated abdominal pain in the left upper quadrant as well as the infraumbilical area and right lower quadrant.  He has been vomiting even fluids.  He has been having tactile fever and chills.  He admits to body aches as well as rhinorrhea over the last couple of days.  He denies any loss of taste or smell.  No recent COVID-19 exposure.  He states that his urine has been dark with diminished urine output.  He stated that he had a fall on 12/1 with subsequent right-sided ninth and 10th rib fractures which has been associated with cough worsening his pain and expectoration of greenish sputum.  No dyspnea or hemoptysis or palpitations.  Upon presentation to the emergency room, vital signs were within normal except for blood pressure 147/80 and has been as high as 149/109 and heart rate later on was 120.  Labs revealed a BUN of 13 and creatinine 1.2 up from 0.87 on 08/28/2018.  Serum lipase was only 25.  CBC showed significant concentration with hemoglobin of 18.6 and hematocrit 56 and his WBC was 10.7.  Influenza AMB antigen came back negative.  COVID-19 PCR came back negative.  Urinalysis showed 6-10 RBCs with 0-5 WBCs and rare bacteria with 20 ketones, 100 protein and specific gravity 1021.  Two-view abdomen x-ray initially showed findings  concerning for small bowel obstruction or ileus with probable ascites, emphysema with no acute cardiopulmonary disease.  Abdominal pelvic CT scan revealed dilated small bowel loops without definite transition, likely representing ileus versus less likely a small bowel obstruction.  Acute sigmoid diverticulosis with possible mild acute diverticulitis.  It also showed bilateral nonobstructing renal calculi with no hydronephrosis and aortic atherosclerosis..  Patient was given 50 mcg of IV fentanyl, 4 mg of IV Zofran twice and 4 mg p.o. as well as 1 L bolus of IV normal saline.  An NG tube was placed.  X-ray initially showed the tip to be in the distal esophagus and after being pushed repeat x-ray showed in the side port projecting over the stomach.  He will be admitted to a medical bed for further evaluation and management. PAST MEDICAL HISTORY:   Past Medical History:  Diagnosis Date  . Allergy   . Anginal pain (HCC)   . Bipolar disorder (HCC)   . BPH (benign prostatic hypertrophy)   . CHF (congestive heart failure) (HCC)   . COPD (chronic obstructive pulmonary disease) (HCC)   . COPD exacerbation (HCC) 11/02/2015  . Coronary artery disease   . Deep vein thrombosis (DVT) (HCC)   . DJD (degenerative joint disease)   . Gastric ulcer   . GERD (gastroesophageal reflux disease)   . History of hiatal hernia   . Hypertension   . Hypothyroidism   . Kidney stones   . Lethargy  11/02/2015  . Neuromuscular disorder ([place])   . PTSD (post-traumatic stress disorder)     PAST SURGICAL HISTORY:   Past Surgical History:  Procedure Laterality Date  . APPENDECTOMY    . colon polyps    . HEMORRHOIDECTOMY WITH HEMORRHOID BANDING    . HERNIA REPAIR    . INNER EAR SURGERY    . JOINT REPLACEMENT Left   . TOTAL KNEE ARTHROPLASTY Right 11/01/2015   Procedure: TOTAL KNEE ARTHROPLASTY;  Surgeon: Thornton Park, MD;  Location: [facility] ORS;  Service: Orthopedics;  Laterality: Right;    SOCIAL HISTORY:   Social  History   Tobacco Use  . Smoking status: Current Every Day Smoker    Packs/day: 1.50    Types: Cigarettes  . Smokeless tobacco: Never Used  . Tobacco comment: cutting down, not ready to quit  Substance Use Topics  . Alcohol use: No    FAMILY HISTORY:   Family History  Problem Relation Age of Onset  . Depression Mother   . Diabetes Mother   . Heart disease Mother   . COPD Mother   . Heart disease Father   . Cancer Father     DRUG ALLERGIES:   Allergies  Allergen Reactions  . Aspirin Other (See Comments)    Other Reaction: steven-johnson reaction [name] Syndrome  . Bee Venom Anaphylaxis  . Sulfa Antibiotics Swelling    Other reaction(s): Unknown    REVIEW OF SYSTEMS:   ROS As per history of present illness. All pertinent systems were reviewed above. Constitutional,  HEENT, cardiovascular, respiratory, GI, GU, musculoskeletal, neuro, psychiatric, endocrine,  integumentary and hematologic systems were reviewed and are otherwise  negative/unremarkable except for positive findings mentioned above in the HPI.   MEDICATIONS AT HOME:   Prior to Admission medications   Medication Sig Start Date End Date Taking? Authorizing Provider  atomoxetine (STRATTERA) 40 MG capsule Take 80 mg by mouth daily.   Yes [provider]  DULoxetine (CYMBALTA) 60 MG capsule Take 120 mg by mouth daily.    Yes [provider]  finasteride (PROSCAR) 5 MG tablet Take 5 mg by mouth daily.    Yes [provider]  fluticasone (FLONASE) 50 MCG/ACT nasal spray Place 2 sprays into both nostrils daily.    Yes [provider]  isosorbide mononitrate (IMDUR) 30 MG 24 hr tablet Take 30 mg by mouth daily.  01/23/16 09/05/19 Yes [provider]  levothyroxine (SYNTHROID, LEVOTHROID) 50 MCG tablet Take 50 mcg by mouth daily before breakfast.    Yes [provider]  loratadine (CLARITIN) 10 MG tablet Take 10 mg by mouth daily.    Yes [provider]  meloxicam (MOBIC) 15 MG tablet Take 1 tablet (15 mg total) by mouth daily. 08/16/19  Yes Cuthriell, Charline Bills, PA-C  metoprolol succinate (TOPROL-XL) 50 MG 24 hr tablet Take 50 mg by mouth daily.    Yes [provider]  mirtazapine (REMERON) 15 MG tablet Take 15 mg by mouth daily.   Yes [provider]  omeprazole (PRILOSEC) 40 MG capsule Take 40 mg by mouth daily.    Yes [provider]  oxyCODONE-acetaminophen (PERCOCET) 10-325 MG tablet Take 1 tablet by mouth every 4 (four) hours as needed for pain.   Yes [provider]  polyethylene glycol (MIRALAX / GLYCOLAX) packet Take 17 g by mouth daily. 08/29/18  Yes Dustin Flock, MD  potassium citrate (UROCIT-K) 10 MEQ (1080 MG) SR tablet Take 20 mEq by mouth 2 (two)  times daily.    Yes [provider]  pravastatin (PRAVACHOL) 80 MG tablet Take 80 mg by mouth at bedtime.    Yes [provider]  pregabalin (LYRICA) 75 MG capsule Take 1 capsule (75 mg total) by mouth 3 (three) times daily. Patient taking differently: Take 300 mg by mouth 2 (two) times daily.  02/05/17 09/05/19 Yes Delano MetzNaveira, Francisco, MD  pregabalin (LYRICA) 75 MG capsule Take 150 mg by mouth 2 (two) times daily.   Yes [provider]  sildenafil (REVATIO) 20 MG tablet Take 3 tablets by mouth as needed. 08/27/19  Yes [provider]  tamsulosin (FLOMAX) 0.4 MG CAPS capsule Take 0.4 mg by mouth daily.    Yes [provider]  traZODone (DESYREL) 100 MG tablet Take 300 mg by mouth at bedtime.    Yes [provider]  warfarin (COUMADIN) 4 MG tablet Take 4 mg by mouth every evening.    Yes [provider]  ziprasidone (GEODON) 60 MG capsule Take 60 mg by mouth 2 (two) times daily with a meal.    Yes [provider]  nitroGLYCERIN (NITROSTAT) 0.4 MG SL tablet Place 0.4 mg under the tongue every 5 (five) minutes as needed for chest pain.     [provider]    oxyCODONE (OXY IR/ROXICODONE) 5 MG immediate release tablet Take 1 tablet (5 mg total) by mouth every 6 (six) hours as needed for severe pain. 01/08/17 02/07/17  Delano MetzNaveira, Francisco, MD  oxyCODONE (OXY IR/ROXICODONE) 5 MG immediate release tablet Take 1 tablet (5 mg total) by mouth 4 (four) times daily. Max: 4/day 02/07/17 02/14/17  Delano MetzNaveira, Francisco, MD  oxyCODONE (OXY IR/ROXICODONE) 5 MG immediate release tablet Take 1 tablet (5 mg total) by mouth 3 (three) times daily. Max: 3/day 02/14/17 02/21/17  Delano MetzNaveira, Francisco, MD  oxyCODONE (OXY IR/ROXICODONE) 5 MG immediate release tablet Take 1 tablet (5 mg total) by mouth 2 (two) times daily. Max: 2/day 02/21/17 02/28/17  Delano MetzNaveira, Francisco, MD  oxyCODONE (OXY IR/ROXICODONE) 5 MG immediate release tablet Take 1 tablet (5 mg total) by mouth daily. Max: 1/day 02/28/17 03/07/17  Delano MetzNaveira, Francisco, MD      VITAL SIGNS:  Blood pressure (!) 147/105, pulse (!) 101, temperature 97.6 F (36.4 C), temperature source Axillary, resp. rate 14, height 5\' 8"  (1.727 m), weight 81.6 kg, SpO2 95 %.  PHYSICAL EXAMINATION:  Physical Exam  GENERAL:  58 y.o.-year-old Caucasian male patient lying in the bed with no acute distress.  EYES: Pupils equal, round, reactive to light and accommodation. No scleral icterus. Extraocular muscles intact.  HEENT: Head atraumatic, normocephalic. Oropharynx and nasopharynx clear.  NECK:  Supple, no jugular venous distention. No thyroid enlargement, no tenderness.  LUNGS: Normal breath sounds bilaterally, no wheezing, rales,rhonchi or crepitation. No use of accessory muscles of respiration.  CARDIOVASCULAR: Regular rate and rhythm, S1, S2 normal. No murmurs, rubs, or gallops.  ABDOMEN: Soft with left upper and lower quadrant tenderness as well as suprapubic tenderness without rebound tenderness guarding or rigidity.. Bowel sounds were diminished.  No organomegaly or mass.  EXTREMITIES: No pedal edema, cyanosis, or clubbing.  NEUROLOGIC: Cranial  nerves II through XII are intact. Muscle strength 5/5 in all extremities. Sensation intact. Gait not checked.  PSYCHIATRIC: The patient is alert and oriented x 3.  Normal affect and good eye contact. SKIN: No obvious rash, lesion, or ulcer.   LABORATORY PANEL:   CBC Recent Labs  Lab 09/05/19 1637  WBC 10.7*  HGB 18.6*  HCT  56.0*  PLT 271   ------------------------------------------------------------------------------------------------------------------  Chemistries  Recent Labs  Lab 09/05/19 1637  NA 139  K 4.1  CL 101  CO2 20*  GLUCOSE 133*  BUN 13  CREATININE 1.20  CALCIUM 10.3  AST 23  ALT 22  ALKPHOS 87  BILITOT 1.0   ------------------------------------------------------------------------------------------------------------------  Cardiac Enzymes No results for input(s): TROPONINI in the last 168 hours. ------------------------------------------------------------------------------------------------------------------  RADIOLOGY:  CT ABDOMEN PELVIS W CONTRAST  Result Date: 09/05/2019 CLINICAL DATA:  58 year old male with abdominal pain, nausea and vomiting. EXAM: CT ABDOMEN AND PELVIS WITH CONTRAST TECHNIQUE: Multidetector CT imaging of the abdomen and pelvis was performed using the standard protocol following bolus administration of intravenous contrast. CONTRAST:  100mL OMNIPAQUE IOHEXOL 300 MG/ML  SOLN COMPARISON:  CT of the abdomen pelvis dated 08/23/2018. FINDINGS: Lower chest: Mild emphysema. The visualized lung bases are otherwise clear. No intra-abdominal free air or free fluid. Hepatobiliary: The liver is unremarkable. No intrahepatic biliary ductal dilatation. No calcified gallstone or pericholecystic fluid. Pancreas: Unremarkable. No pancreatic ductal dilatation or surrounding inflammatory changes. Spleen: Normal in size without focal abnormality. Adrenals/Urinary Tract: The adrenal glands are unremarkable. Multiple bilateral nonobstructing renal calculi  measure up to 3-4 mm in the left kidney. There is no hydronephrosis on either side. There is symmetric enhancement and excretion of contrast by both kidneys. There is a 1 cm left renal interpolar cyst. Additional subcentimeter hypodense lesions are too small to characterize. The visualized ureters appear unremarkable. There is apparent diffuse thickening of the bladder wall which may be partly related to underdistention. Cystitis is not excluded. Correlation with urinalysis recommended. Stomach/Bowel: There is sigmoid diverticulosis. There is minimal perisigmoid stranding and haziness which may be chronic. Mild acute diverticulitis is not excluded. Clinical correlation recommended. Multiple mildly dilated loops of small bowel containing air and fluid measure up to 4 cm in caliber. There is gradual tapering of the distal small bowel loops without a definite transition. The terminal ileum demonstrate a normal caliber. Findings may represent an ileus rather than a small-bowel obstruction. However, an apparent transition focus may be present in the mid abdomen (series 2, image 60 and coronal series 5, image 40) which may represent an area of adhesion. Small-bowel series may provide better evaluation. There is postsurgical changes of partial small bowel resection with anastomotic suture in the left hemiabdomen. Vascular/Lymphatic: Moderate aortoiliac atherosclerotic disease. The IVC is unremarkable. No portal venous gas. There is no adenopathy. Reproductive: The prostate and seminal vesicles are grossly unremarkable. No pelvic mass. Partially visualized small right hydrocele. Other: None Musculoskeletal: No acute or significant osseous findings. IMPRESSION: 1. Dilated loops of small bowel without a definite transition, likely represent ileus versus less likely a small-bowel obstruction. Small-bowel series may provide better evaluation. 2. Sigmoid diverticulosis with possible mild acute diverticulitis. Clinical  correlation recommended. No abscess. 3. Bilateral nonobstructing renal calculi. No hydronephrosis. 4. Aortic Atherosclerosis (ICD10-I70.0). Electronically Signed   By: Elgie CollardArash  Radparvar M.D.   On: 09/05/2019 21:45   DG Abdomen Acute W/Chest  Result Date: 09/05/2019 CLINICAL DATA:  58 year old male with abdominal pain, nausea vomiting. EXAM: DG ABDOMEN ACUTE W/ 1V CHEST COMPARISON:  Radiograph dated 08/28/2018. FINDINGS: There is background of emphysema and chronic bronchitic changes. No focal consolidation, pleural effusion, pneumothorax. The cardiac silhouette is within normal limits. There is atherosclerotic calcification of the aorta. Multiple mildly dilated loops of small bowel with air-fluid level measure up to 4 cm in caliber most consistent with obstruction or ileus. There is distention of the  abdomen with central positioning of the small bowel suggestive of ascites. No free air identified. The osseous structures and soft tissues are grossly unremarkable. IMPRESSION: 1. Findings concerning for small bowel obstruction or ileus. 2. Probable ascites. 3. No acute cardiopulmonary process. 4. Emphysema. Electronically Signed   By: Elgie Collard M.D.   On: 09/05/2019 20:07   DG Abd Portable 1 View  Result Date: 09/05/2019 CLINICAL DATA:  NG tube EXAM: PORTABLE ABDOMEN - 1 VIEW COMPARISON:  None. FINDINGS: Tip the NG tube is seen projecting into the distal esophagus. There is air seen in mildly dilated loops of small bowel in the mid abdomen. IMPRESSION: Tip the NG tube in the distal esophagus. Electronically Signed   By: Jonna Clark M.D.   On: 09/05/2019 23:32      IMPRESSION AND PLAN:   1.  Ileus and possibly small bowel obstruction.  The patient will be admitted to a medical bed.  We will keep him n.p.o.  Pain management will be provided.  As needed antiemetics will be given.  Will obtain follow-up two-view abdomen in a.m.  An NG tube was placed and will be continued at intermittent suction for  now.  A general surgery consultation will be obtained by Dr. Maia Plan.  I notified him and he is aware about the patient.  2.  Hypertension.  His Toprol-XL be continued and will place on as needed IV labetalol  3.  Hypothyroidism.  We will check TSH and continue Synthroid.  4.  Coronary artery disease.  Continue Imdur and as needed sublingual glycerin.  5.  Dyslipidemia.  We will continue statin therapy.  6.  Pulmonary hypertension.  We will continue Revatio.  7.  Lower extremity radiculopathy.  Lyrica will be resumed.  8.  Ongoing heavy tobacco abuse.  Counseled the patient for smoking cessation and he will receive further counseling here.  We can utilize NicoDerm CQ patch if he has any nicotine withdrawal.  9.  DVT prophylaxis.  Subcutaneous Lovenox.  All the records are reviewed and case discussed with ED provider. The plan of care was discussed in details with the patient (and family). I answered all questions. The patient agreed to proceed with the above mentioned plan. Further management will depend upon hospital course.   CODE STATUS: I discussed the CODE STATUS with the patient and he desires to be full code  TOTAL TIME TAKING CARE OF THIS PATIENT: Fifty-five minutes.    Hannah Beat M.D on 09/05/2019 at 11:50 PM  [facility]   From 7 PM-7 AM, contact night-coverage [email or web address]  CC: Primary care physician; Dortha Kern, MD   Note: This dictation was prepared with Dragon dictation along with smaller phrase technology. Any transcriptional errors that result from this process are unintentional.

## 2019-09-06 ENCOUNTER — Encounter: Payer: Self-pay | Admitting: Family Medicine

## 2019-09-06 ENCOUNTER — Inpatient Hospital Stay: Payer: Medicaid Other

## 2019-09-06 DIAGNOSIS — I272 Pulmonary hypertension, unspecified: Secondary | ICD-10-CM

## 2019-09-06 DIAGNOSIS — Z9189 Other specified personal risk factors, not elsewhere classified: Secondary | ICD-10-CM

## 2019-09-06 LAB — BASIC METABOLIC PANEL
Anion gap: 15 (ref 5–15)
BUN: 19 mg/dL (ref 6–20)
CO2: 22 mmol/L (ref 22–32)
Calcium: 9.1 mg/dL (ref 8.9–10.3)
Chloride: 104 mmol/L (ref 98–111)
Creatinine, Ser: 1.14 mg/dL (ref 0.61–1.24)
GFR calc Af Amer: >60 mL/min (ref >60)
GFR calc non Af Amer: >60 mL/min (ref >60)
Glucose, Bld: 126 mg/dL — ABNORMAL HIGH (ref 70–99)
Potassium: 3.8 mmol/L (ref 3.5–5.1)
Sodium: 141 mmol/L (ref 135–145)

## 2019-09-06 LAB — CBC
HCT: 50 % (ref 39.0–52.0)
Hemoglobin: 16.5 g/dL (ref 13.0–17.0)
MCH: 31.3 pg (ref 26.0–34.0)
MCHC: 33 g/dL (ref 30.0–36.0)
MCV: 94.7 fL (ref 80.0–100.0)
Platelets: 256 10*3/uL (ref 150–400)
RBC: 5.28 MIL/uL (ref 4.22–5.81)
RDW: 13.4 % (ref 11.5–15.5)
WBC: 9.7 10*3/uL (ref 4.0–10.5)
nRBC: 0 % (ref 0.0–0.2)

## 2019-09-06 LAB — HEPARIN LEVEL (UNFRACTIONATED): Heparin Unfractionated: 0.11 IU/mL — ABNORMAL LOW (ref 0.30–0.70)

## 2019-09-06 LAB — PROTIME-INR
INR: 1.7 — ABNORMAL HIGH (ref 0.8–1.2)
Prothrombin Time: 20.2 seconds — ABNORMAL HIGH (ref 11.4–15.2)

## 2019-09-06 MED ORDER — MORPHINE SULFATE (PF) 2 MG/ML IV SOLN
2.0000 mg | INTRAVENOUS | Status: DC | PRN
  Administered 2019-09-06 – 2019-09-11 (×18): 2 mg via INTRAVENOUS
  Filled 2019-09-06 (×18): qty 1

## 2019-09-06 MED ORDER — SIMETHICONE 80 MG PO CHEW
80.0000 mg | CHEWABLE_TABLET | Freq: Four times a day (QID) | ORAL | Status: DC | PRN
  Administered 2019-09-06: 80 mg via ORAL
  Filled 2019-09-06: qty 1

## 2019-09-06 MED ORDER — WARFARIN SODIUM 6 MG PO TABS: 6.0000 mg | ORAL_TABLET | Freq: Once | ORAL | Status: DC

## 2019-09-06 MED ORDER — ALUM & MAG HYDROXIDE-SIMETH 200-200-20 MG/5ML PO SUSP
30.0000 mL | ORAL | Status: DC | PRN
  Administered 2019-09-06: 21:00:00 30 mL via ORAL
  Filled 2019-09-06: qty 30

## 2019-09-06 MED ORDER — WARFARIN - PHARMACIST DOSING INPATIENT: Freq: Every day | Status: DC

## 2019-09-06 MED ORDER — PHENOL 1.4 % MT LIQD
1.0000 | OROMUCOSAL | Status: DC | PRN
  Administered 2019-09-06: 1 via OROMUCOSAL
  Filled 2019-09-06: qty 177

## 2019-09-06 MED ORDER — HEPARIN BOLUS VIA INFUSION
2400.0000 [IU] | Freq: Once | INTRAVENOUS | Status: AC
  Administered 2019-09-06: 2400 [IU] via INTRAVENOUS
  Filled 2019-09-06: qty 2400

## 2019-09-06 MED ORDER — HEPARIN (PORCINE) 25000 UT/250ML-% IV SOLN: 10.0000 [IU]/kg/h | INTRAVENOUS | Status: DC

## 2019-09-06 MED ORDER — DIATRIZOATE MEGLUMINE & SODIUM 66-10 % PO SOLN
90.0000 mL | Freq: Once | ORAL | Status: AC
  Administered 2019-09-06: 90 mL via NASOGASTRIC

## 2019-09-06 MED ORDER — NICOTINE 14 MG/24HR TD PT24: 14.0000 mg | MEDICATED_PATCH | Freq: Every day | TRANSDERMAL | Status: DC

## 2019-09-06 MED ORDER — HEPARIN (PORCINE) 25000 UT/250ML-% IV SOLN
1600.0000 [IU]/h | INTRAVENOUS | Status: AC
  Administered 2019-09-06: 1200 [IU]/h via INTRAVENOUS
  Administered 2019-09-07: 1600 [IU]/h via INTRAVENOUS
  Administered 2019-09-07: 1500 [IU]/h via INTRAVENOUS
  Filled 2019-09-06 (×3): qty 250

## 2019-09-06 MED ORDER — WITCH HAZEL-GLYCERIN EX PADS
MEDICATED_PAD | CUTANEOUS | Status: DC | PRN
  Filled 2019-09-06 (×2): qty 100

## 2019-09-06 MED ORDER — MORPHINE SULFATE (PF) 2 MG/ML IV SOLN: 2.0000 mg | INTRAVENOUS | Status: DC | PRN

## 2019-09-06 MED ORDER — NICOTINE 21 MG/24HR TD PT24
21.0000 mg | MEDICATED_PATCH | Freq: Every day | TRANSDERMAL | Status: DC
  Administered 2019-09-06 – 2019-09-12 (×7): 21 mg via TRANSDERMAL
  Filled 2019-09-06 (×7): qty 1

## 2019-09-06 NOTE — ED Notes (Addendum)
Admitting at the bedside for pt evaluation 

## 2019-09-06 NOTE — ED Notes (Signed)
Pt assisted up to restroom. No slip socks placed on pt. Pt denies dizziness and states he is feeling better. Gait initially unsteady but improved with ambulation.

## 2019-09-06 NOTE — Consult Note (Signed)
ANTICOAGULATION CONSULT NOTE  Pharmacy Consult for heparin Indication: VTE prophylaxis  Patient Measurements: Height: 5\' 8"  (172.7 cm) Weight: 175 lb 7.8 oz (79.6 kg) IBW/kg (Calculated) : 68.4  Vital Signs: Temp: 98.2 F (36.8 C) (12/22 0552) Temp Source: Oral (12/22 0145) BP: 143/92 (12/22 0552) Pulse Rate: 116 (12/22 0552)  Labs: Recent Labs    09/05/19 1637 09/06/19 0558 09/06/19 0756  HGB 18.6* 16.5  --   HCT 56.0* 50.0  --   PLT 271 256  --   LABPROT  --   --  20.2*  INR  --   --  1.7*  CREATININE 1.20 1.14  --     Estimated Creatinine Clearance: 68.3 mL/min (by C-G formula based on SCr of 1.14 mg/dL).   Medical History: Past Medical History:  Diagnosis Date  . Allergy   . Anginal pain ([place])   . Bipolar disorder ([place])   . BPH (benign prostatic hypertrophy)   . CHF (congestive heart failure) ([place])   . COPD (chronic obstructive pulmonary disease) ([employer])   . COPD exacerbation ([place]) 11/02/2015  . Coronary artery disease   . Deep vein thrombosis (DVT) ([place])   . DJD (degenerative joint disease)   . Gastric ulcer   . GERD (gastroesophageal reflux disease)   . History of hiatal hernia   . Hypertension   . Hypothyroidism   . Kidney stones   . Lethargy 11/02/2015  . Neuromuscular disorder ([place])   . PTSD (post-traumatic stress disorder)     Medications:  Scheduled:  . atomoxetine  80 mg Oral Daily  . diatrizoate meglumine-sodium  90 mL Per NG tube Once  . DULoxetine  120 mg Oral Daily  . finasteride  5 mg Oral Daily  . fluticasone  2 spray Each Nare Daily  . isosorbide mononitrate  30 mg Oral Daily  . levothyroxine  50 mcg Oral QAC breakfast  . loratadine  10 mg Oral Daily  . metoprolol succinate  50 mg Oral Daily  . mirtazapine  15 mg Oral Daily  . nicotine  21 mg Transdermal Daily  . pantoprazole  40 mg Oral Daily  . polyethylene glycol  17 g Oral Daily  . potassium citrate  20 mEq Oral BID  . pravastatin  80 mg Oral q1800  . pregabalin  300 mg  Oral BID  . tamsulosin  0.4 mg Oral Daily  . traZODone  300 mg Oral QHS  . ziprasidone  60 mg Oral BID WC    Assessment: 58 y.o. Caucasian male with a known history of CHF, BPH, COPD, GERD and peptic ulcer disease as well as hypertension and hypothyroidism, presented to the emergency room with a SBO. He takes 4 mg warfarin daily for a h/o DVTs and his last dose reported to me was 12/19. He missed several doses due to nausea. H&H trending down, PLT wnl. He is being switched to heparin in case of need for surgical intervention  Goal of Therapy:  Heparin level 0.3 - 0.7 units/mL Monitor platelets by anticoagulation protocol: Yes   Plan:   Dr Priscella Mann does not want the initial bolus since INR is 1.7  Start heparin infusion at 1200 units/hr  Check heparin level 6 hours after infusion begins  CBC in am  Dallie Piles 09/06/2019,10:02 AM

## 2019-09-06 NOTE — Progress Notes (Signed)
Report called to Colvin Caroli RN for patient being transferred to room 208.

## 2019-09-06 NOTE — Consult Note (Signed)
ANTICOAGULATION CONSULT NOTE  Pharmacy Consult for warfarin dosing Indication: VTE prophylaxis  Patient Measurements: Height: 5\' 8"  (172.7 cm) Weight: 175 lb 7.8 oz (79.6 kg) IBW/kg (Calculated) : 68.4  Vital Signs: Temp: 98.2 F (36.8 C) (12/22 0552) Temp Source: Oral (12/22 0145) BP: 143/92 (12/22 0552) Pulse Rate: 116 (12/22 0552)  Labs: Recent Labs    09/05/19 1637 09/06/19 0558 09/06/19 0756  HGB 18.6* 16.5  --   HCT 56.0* 50.0  --   PLT 271 256  --   LABPROT  --   --  20.2*  INR  --   --  1.7*  CREATININE 1.20 1.14  --     Estimated Creatinine Clearance: 68.3 mL/min (by C-G formula based on SCr of 1.14 mg/dL).   Medical History: Past Medical History:  Diagnosis Date  . Allergy   . Anginal pain ([place])   . Bipolar disorder ([place])   . BPH (benign prostatic hypertrophy)   . CHF (congestive heart failure) ([place])   . COPD (chronic obstructive pulmonary disease) ([place])   . COPD exacerbation ([place]) 11/02/2015  . Coronary artery disease   . Deep vein thrombosis (DVT) ([place])   . DJD (degenerative joint disease)   . Gastric ulcer   . GERD (gastroesophageal reflux disease)   . History of hiatal hernia   . Hypertension   . Hypothyroidism   . Kidney stones   . Lethargy 11/02/2015  . Neuromuscular disorder ([place])   . PTSD (post-traumatic stress disorder)     Medications:  Scheduled:  . atomoxetine  80 mg Oral Daily  . diatrizoate meglumine-sodium  90 mL Per NG tube Once  . DULoxetine  120 mg Oral Daily  . enoxaparin (LOVENOX) injection  40 mg Subcutaneous Q24H  . finasteride  5 mg Oral Daily  . fluticasone  2 spray Each Nare Daily  . isosorbide mononitrate  30 mg Oral Daily  . levothyroxine  50 mcg Oral QAC breakfast  . loratadine  10 mg Oral Daily  . metoprolol succinate  50 mg Oral Daily  . mirtazapine  15 mg Oral Daily  . nicotine  14 mg Transdermal Daily  . pantoprazole  40 mg Oral Daily  . polyethylene glycol  17 g Oral Daily  . potassium citrate  20 mEq  Oral BID  . pravastatin  80 mg Oral q1800  . pregabalin  300 mg Oral BID  . tamsulosin  0.4 mg Oral Daily  . traZODone  300 mg Oral QHS  . warfarin  4 mg Oral QPM  . ziprasidone  60 mg Oral BID WC    Assessment: 58 y.o. Caucasian male with a known history of CHF, BPH, COPD, GERD and peptic ulcer disease as well as hypertension and hypothyroidism, presented to the emergency room with a SBO. He takes 4 mg warfarin daily for a h/o DVTs and his last dose reported to me was 12/19. He missed several doses due to nausea. H&H trending down, PLT wnl  Goal of Therapy:  INR 2-3 Monitor platelets by anticoagulation protocol: Yes   Plan:   Warfarin 6 mg x 1 today: this is 50% greater than his home dose  INR in am  Dallie Piles 09/06/2019,8:40 AM

## 2019-09-06 NOTE — Consult Note (Signed)
ANTICOAGULATION CONSULT NOTE  Pharmacy Consult for heparin Indication: VTE prophylaxis  Patient Measurements: Height: 5\' 8"  (172.7 cm) Weight: 175 lb 7.8 oz (79.6 kg) IBW/kg (Calculated) : 68.4  Vital Signs: Temp: 98.7 F (37.1 C) (12/22 2052) Temp Source: Oral (12/22 2052) BP: 131/84 (12/22 2052) Pulse Rate: 116 (12/22 2052)  Labs: Recent Labs    09/05/19 1637 09/06/19 0558 09/06/19 0756 09/06/19 1916  HGB 18.6* 16.5  --   --   HCT 56.0* 50.0  --   --   PLT 271 256  --   --   LABPROT  --   --  20.2*  --   INR  --   --  1.7*  --   HEPARINUNFRC  --   --   --  0.11*  CREATININE 1.20 1.14  --   --     Estimated Creatinine Clearance: 68.3 mL/min (by C-G formula based on SCr of 1.14 mg/dL).   Medical History: Past Medical History:  Diagnosis Date  . Allergy   . Anginal pain (HCC)   . Bipolar disorder (HCC)   . BPH (benign prostatic hypertrophy)   . CHF (congestive heart failure) (HCC)   . COPD (chronic obstructive pulmonary disease) (HCC)   . COPD exacerbation (HCC) 11/02/2015  . Coronary artery disease   . Deep vein thrombosis (DVT) (HCC)   . DJD (degenerative joint disease)   . Gastric ulcer   . GERD (gastroesophageal reflux disease)   . History of hiatal hernia   . Hypertension   . Hypothyroidism   . Kidney stones   . Lethargy 11/02/2015  . Neuromuscular disorder (HCC)   . PTSD (post-traumatic stress disorder)     Medications:  Scheduled:  . atomoxetine  80 mg Oral Daily  . DULoxetine  120 mg Oral Daily  . finasteride  5 mg Oral Daily  . fluticasone  2 spray Each Nare Daily  . heparin  2,400 Units Intravenous Once  . isosorbide mononitrate  30 mg Oral Daily  . levothyroxine  50 mcg Oral QAC breakfast  . loratadine  10 mg Oral Daily  . metoprolol succinate  50 mg Oral Daily  . mirtazapine  15 mg Oral Daily  . nicotine  21 mg Transdermal Daily  . pantoprazole  40 mg Oral Daily  . polyethylene glycol  17 g Oral Daily  . potassium citrate  20 mEq  Oral BID  . pravastatin  80 mg Oral q1800  . pregabalin  300 mg Oral BID  . tamsulosin  0.4 mg Oral Daily  . traZODone  300 mg Oral QHS  . ziprasidone  60 mg Oral BID WC    Assessment: 58 y.o. Caucasian male with a known history of CHF, BPH, COPD, GERD and peptic ulcer disease as well as hypertension and hypothyroidism, presented to the emergency room with a SBO. He takes 4 mg warfarin daily for a h/o DVTs and his last dose reported to me was 12/19. He missed several doses due to nausea. H&H trending down, PLT wnl. He is being switched to heparin in case of need for surgical intervention  Goal of Therapy:  Heparin level 0.3 - 0.7 units/mL Monitor platelets by anticoagulation protocol: Yes   Plan:   Dr 1/20 does not want the initial bolus since INR is 1.7  Start heparin infusion at 1200 units/hr  Check heparin level 6 hours after infusion begins  CBC in am  12/22:  HL @ 1916 = 0.11 Will order heparin 2400 units  IV X 1 bolus, will increase drip rate to 1500 units/hr. Will recheck HL 6 hrs after rate change.   Dondi Burandt D 09/06/2019,9:46 PM

## 2019-09-06 NOTE — Consult Note (Signed)
SURGICAL CONSULTATION NOTE   HISTORY OF PRESENT ILLNESS (HPI):  58 y.o. male presented to [facility] ED for evaluation of abdominal soreness, nausea, vomiting and diarrhea since 5 days ago. Patient reports he has persistent abdominal soreness.  This is not localized to specific area of the abdomen.  There is no pain radiation.  Patient denies any nausea or vomiting since admission.  He does endorses having bowel movement.  Patient has history of previous bowel obstruction.  Patient has history of appendectomy and hernia repair in the past.  At the ED he reported that he had fever and COVID-19 was suspected.  Covid testing was done and was negative for COVID-19.  Abdominal x-ray was done showing air-fluid levels.  CT scan of the abdomen was done showing dilated small bowel with no transition point.  There is no free fluid or free air.  I personally evaluated the images of the abdominal x-ray and the CT scan.  White blood cell count minimally elevated.  Hemoglobin of 18.  No acidosis.   Patient was admitted for management of small bowel obstruction.  Surgery is consulted by Dr. Derrill KayGoodman in this context for evaluation and management of small bowel obstruction.  PAST MEDICAL HISTORY (PMH):  Past Medical History:  Diagnosis Date  . Allergy   . Anginal pain (HCC)   . Bipolar disorder (HCC)   . BPH (benign prostatic hypertrophy)   . CHF (congestive heart failure) (HCC)   . COPD (chronic obstructive pulmonary disease) (HCC)   . COPD exacerbation (HCC) 11/02/2015  . Coronary artery disease   . Deep vein thrombosis (DVT) (HCC)   . DJD (degenerative joint disease)   . Gastric ulcer   . GERD (gastroesophageal reflux disease)   . History of hiatal hernia   . Hypertension   . Hypothyroidism   . Kidney stones   . Lethargy 11/02/2015  . Neuromuscular disorder (HCC)   . PTSD (post-traumatic stress disorder)      PAST SURGICAL HISTORY (PSH):  Past Surgical History:  Procedure Laterality Date  .  APPENDECTOMY    . colon polyps    . HEMORRHOIDECTOMY WITH HEMORRHOID BANDING    . HERNIA REPAIR    . INNER EAR SURGERY    . JOINT REPLACEMENT Left   . TOTAL KNEE ARTHROPLASTY Right 11/01/2015   Procedure: TOTAL KNEE ARTHROPLASTY;  Surgeon: Juanell FairlyKevin Krasinski, MD;  Location: [facility] ORS;  Service: Orthopedics;  Laterality: Right;     MEDICATIONS:  Prior to Admission medications   Medication Sig Start Date End Date Taking? Authorizing Provider  atomoxetine (STRATTERA) 40 MG capsule Take 80 mg by mouth daily.   Yes [provider]  DULoxetine (CYMBALTA) 60 MG capsule Take 120 mg by mouth daily.    Yes [provider]  finasteride (PROSCAR) 5 MG tablet Take 5 mg by mouth daily.    Yes [provider]  fluticasone (FLONASE) 50 MCG/ACT nasal spray Place 2 sprays into both nostrils daily.    Yes [provider]  isosorbide mononitrate (IMDUR) 30 MG 24 hr tablet Take 30 mg by mouth daily.  01/23/16 09/05/19 Yes [provider]  levothyroxine (SYNTHROID, LEVOTHROID) 50 MCG tablet Take 50 mcg by mouth daily before breakfast.    Yes [provider]  loratadine (CLARITIN) 10 MG tablet Take 10 mg by mouth daily.    Yes [provider]  meloxicam (MOBIC) 15 MG tablet Take 1 tablet (15 mg total) by mouth daily. 08/16/19  Yes Cuthriell, Delorise RoyalsJonathan D, PA-C  metoprolol succinate (TOPROL-XL) 50 MG 24 hr tablet Take 50 mg by mouth daily.    Yes [provider]  mirtazapine (REMERON) 15 MG tablet Take 15 mg by mouth daily.   Yes [provider]  omeprazole (PRILOSEC) 40 MG capsule Take 40 mg by mouth daily.    Yes [provider]  oxyCODONE-acetaminophen (PERCOCET) 10-325 MG tablet Take 1 tablet by mouth every 4 (four) hours as needed for pain.   Yes [provider]  polyethylene glycol (MIRALAX / GLYCOLAX) packet Take 17 g by mouth daily. 08/29/18  Yes Dustin Flock, MD  potassium citrate (UROCIT-K) 10 MEQ (1080 MG) SR  tablet Take 20 mEq by mouth 2 (two) times daily.    Yes [provider]  pravastatin (PRAVACHOL) 80 MG tablet Take 80 mg by mouth at bedtime.    Yes [provider]  pregabalin (LYRICA) 75 MG capsule Take 1 capsule (75 mg total) by mouth 3 (three) times daily. Patient taking differently: Take 300 mg by mouth 2 (two) times daily.  02/05/17 09/05/19 Yes Milinda Pointer, MD  pregabalin (LYRICA) 75 MG capsule Take 150 mg by mouth 2 (two) times daily.   Yes [provider]  sildenafil (REVATIO) 20 MG tablet Take 3 tablets by mouth as needed. 08/27/19  Yes [provider]  tamsulosin (FLOMAX) 0.4 MG CAPS capsule Take 0.4 mg by mouth daily.    Yes [provider]  traZODone (DESYREL) 100 MG tablet Take 300 mg by mouth at bedtime.    Yes [provider]  warfarin (COUMADIN) 4 MG tablet Take 4 mg by mouth every evening.    Yes [provider]  ziprasidone (GEODON) 60 MG capsule Take 60 mg by mouth 2 (two) times daily with a meal.    Yes [provider]  nitroGLYCERIN (NITROSTAT) 0.4 MG SL tablet Place 0.4 mg under the tongue every 5 (five) minutes as needed for chest pain.     [provider]  oxyCODONE (OXY IR/ROXICODONE) 5 MG immediate release tablet Take 1 tablet (5 mg total) by mouth every 6 (six) hours as needed for severe pain. 01/08/17 02/07/17  Milinda Pointer, MD  oxyCODONE (OXY IR/ROXICODONE) 5 MG immediate release tablet Take 1 tablet (5 mg total) by mouth 4 (four) times daily. Max: 4/day 02/07/17 02/14/17  Milinda Pointer, MD  oxyCODONE (OXY IR/ROXICODONE) 5 MG immediate release tablet Take 1 tablet (5 mg total) by mouth 3 (three) times daily. Max: 3/day 02/14/17 02/21/17  Milinda Pointer, MD  oxyCODONE (OXY IR/ROXICODONE) 5 MG immediate release tablet Take 1 tablet (5 mg total) by mouth 2 (two) times daily. Max: 2/day 02/21/17 02/28/17  Milinda Pointer, MD  oxyCODONE (OXY IR/ROXICODONE) 5 MG immediate release tablet  Take 1 tablet (5 mg total) by mouth daily. Max: 1/day 02/28/17 03/07/17  Milinda Pointer, MD     ALLERGIES:  Allergies  Allergen Reactions  . Aspirin Other (See Comments)    Other Reaction: steven-johnson reaction [name] Syndrome  . Bee Venom Anaphylaxis  . Sulfa Antibiotics Swelling    Other reaction(s): Unknown     SOCIAL HISTORY:  Social History   Socioeconomic History  . Marital status: Divorced    Spouse name: Not on file  . Number of children: Not on file  . Years of education: Not on file  . Highest education level: Not on file  Occupational History  . Not on file  Tobacco Use  . Smoking status: Current Every Day Smoker  Packs/day: 1.50    Types: Cigarettes  . Smokeless tobacco: Never Used  . Tobacco comment: cutting down, not ready to quit  Substance and Sexual Activity  . Alcohol use: No  . Drug use: No  . Sexual activity: Not on file  Other Topics Concern  . Not on file  Social History Narrative  . Not on file   Social Determinants of Health   Financial Resource Strain:   . Difficulty of Paying Living Expenses: Not on file  Food Insecurity:   . Worried About Programme researcher, broadcasting/film/video in the Last Year: Not on file  . Ran Out of Food in the Last Year: Not on file  Transportation Needs:   . Lack of Transportation (Medical): Not on file  . Lack of Transportation (Non-Medical): Not on file  Physical Activity:   . Days of Exercise per Week: Not on file  . Minutes of Exercise per Session: Not on file  Stress:   . Feeling of Stress : Not on file  Social Connections:   . Frequency of Communication with Friends and Family: Not on file  . Frequency of Social Gatherings with Friends and Family: Not on file  . Attends Religious Services: Not on file  . Active Member of Clubs or Organizations: Not on file  . Attends Banker Meetings: Not on file  . Marital Status: Not on file  Intimate Partner Violence:   . Fear of Current or Ex-Partner:  Not on file  . Emotionally Abused: Not on file  . Physically Abused: Not on file  . Sexually Abused: Not on file    The patient currently resides (home / rehab facility / nursing home): Home The patient normally is (ambulatory / bedbound): Ambulatory   FAMILY HISTORY:  Family History  Problem Relation Age of Onset  . Depression Mother   . Diabetes Mother   . Heart disease Mother   . COPD Mother   . Heart disease Father   . Cancer Father      REVIEW OF SYSTEMS:  Constitutional: denies weight loss, chills, or sweats.  Positive for fever Eyes: denies any other vision changes, history of eye injury  ENT: denies sore throat, hearing problems  Respiratory: denies shortness of breath, wheezing  Cardiovascular: denies chest pain, palpitations  Gastrointestinal: Positive abdominal pain, nausea and vomiting, and diarrhea Genitourinary: denies burning with urination or urinary frequency Musculoskeletal: denies any other joint pains or cramps  Skin: denies any other rashes or skin discolorations  Neurological: denies any other headache, dizziness, weakness  Psychiatric: denies any other depression, anxiety   All other review of systems were negative   VITAL SIGNS:  Temp:  [97.6 F (36.4 C)-98.5 F (36.9 C)] 98.2 F (36.8 C) (12/22 0552) Pulse Rate:  [101-132] 116 (12/22 0552) Resp:  [14-20] 18 (12/22 0552) BP: (140-176)/(92-122) 143/92 (12/22 0552) SpO2:  [91 %-98 %] 94 % (12/22 0552) Weight:  [79.6 kg-81.6 kg] 79.6 kg (12/22 0500)     Height: 5\' 8"  (172.7 cm) Weight: 79.6 kg BMI (Calculated): 26.69   INTAKE/OUTPUT:  NGT: 590 mL since NGT was placed  PHYSICAL EXAM:  Constitutional:  -- Normal body habitus  -- Awake, alert, and oriented x3  Eyes:  -- Pupils equally round and reactive to light  -- No scleral icterus  Ear, nose, and throat:  -- No jugular venous distension  Pulmonary:  -- No crackles  -- Equal breath sounds bilaterally -- Breathing non-labored at  rest Cardiovascular:  --  S1, S2 present  -- No pericardial rubs Gastrointestinal:  -- Abdomen soft, mild tenderness lower abdomen, distended, no guarding or rebound tenderness -- No abdominal masses appreciated, pulsatile or otherwise  Musculoskeletal and Integumentary:  -- Wounds or skin discoloration: None appreciated -- Extremities: B/L UE and LE FROM, hands and feet warm, no edema  Neurologic:  -- Motor function: intact and symmetric -- Sensation: intact and symmetric   Labs:  CBC Latest Ref Rng & Units 09/06/2019 09/05/2019 08/28/2018  WBC 4.0 - 10.5 K/uL 9.7 10.7(H) 10.7(H)  Hemoglobin 13.0 - 17.0 g/dL 16.116.5 18.6(H) 12.5(L)  Hematocrit 39.0 - 52.0 % 50.0 56.0(H) 39.9  Platelets 150 - 400 K/uL 256 271 282   CMP Latest Ref Rng & Units 09/06/2019 09/05/2019 08/28/2018  Glucose 70 - 99 mg/dL 096(E126(H) 454(U133(H) 981(X108(H)  BUN 6 - 20 mg/dL 19 13 13   Creatinine 0.61 - 1.24 mg/dL 9.141.14 7.821.20 9.560.87  Sodium 135 - 145 mmol/L 141 139 138  Potassium 3.5 - 5.1 mmol/L 3.8 4.1 4.2  Chloride 98 - 111 mmol/L 104 101 107  CO2 22 - 32 mmol/L 22 20(L) 26  Calcium 8.9 - 10.3 mg/dL 9.1 21.310.3 0.8(M8.7(L)  Total Protein 6.5 - 8.1 g/dL - 9.2(H) -  Total Bilirubin 0.3 - 1.2 mg/dL - 1.0 -  Alkaline Phos 38 - 126 U/L - 87 -  AST 15 - 41 U/L - 23 -  ALT 0 - 44 U/L - 22 -    Imaging studies:  EXAM: CT ABDOMEN AND PELVIS WITH CONTRAST  TECHNIQUE: Multidetector CT imaging of the abdomen and pelvis was performed using the standard protocol following bolus administration of intravenous contrast.  CONTRAST:  100mL OMNIPAQUE IOHEXOL 300 MG/ML  SOLN  COMPARISON:  CT of the abdomen pelvis dated 08/23/2018.  FINDINGS: Lower chest: Mild emphysema. The visualized lung bases are otherwise clear.  No intra-abdominal free air or free fluid.  Hepatobiliary: The liver is unremarkable. No intrahepatic biliary ductal dilatation. No calcified gallstone or pericholecystic fluid.  Pancreas: Unremarkable. No  pancreatic ductal dilatation or surrounding inflammatory changes.  Spleen: Normal in size without focal abnormality.  Adrenals/Urinary Tract: The adrenal glands are unremarkable. Multiple bilateral nonobstructing renal calculi measure up to 3-4 mm in the left kidney. There is no hydronephrosis on either side. There is symmetric enhancement and excretion of contrast by both kidneys. There is a 1 cm left renal interpolar cyst. Additional subcentimeter hypodense lesions are too small to characterize. The visualized ureters appear unremarkable. There is apparent diffuse thickening of the bladder wall which may be partly related to underdistention. Cystitis is not excluded. Correlation with urinalysis recommended.  Stomach/Bowel: There is sigmoid diverticulosis. There is minimal perisigmoid stranding and haziness which may be chronic. Mild acute diverticulitis is not excluded. Clinical correlation recommended. Multiple mildly dilated loops of small bowel containing air and fluid measure up to 4 cm in caliber. There is gradual tapering of the distal small bowel loops without a definite transition. The terminal ileum demonstrate a normal caliber. Findings may represent an ileus rather than a small-bowel obstruction. However, an apparent transition focus may be present in the mid abdomen (series 2, image 60 and coronal series 5, image 40) which may represent an area of adhesion. Small-bowel series may provide better evaluation. There is postsurgical changes of partial small bowel resection with anastomotic suture in the left hemiabdomen.  Vascular/Lymphatic: Moderate aortoiliac atherosclerotic disease. The IVC is unremarkable. No portal venous gas. There is no adenopathy.  Reproductive: The prostate and seminal  vesicles are grossly unremarkable. No pelvic mass. Partially visualized small right hydrocele.  Other: None  Musculoskeletal: No acute or significant osseous  findings.  IMPRESSION: 1. Dilated loops of small bowel without a definite transition, likely represent ileus versus less likely a small-bowel obstruction. Small-bowel series may provide better evaluation. 2. Sigmoid diverticulosis with possible mild acute diverticulitis. Clinical correlation recommended. No abscess. 3. Bilateral nonobstructing renal calculi. No hydronephrosis. 4. Aortic Atherosclerosis (ICD10-I70.0).   Electronically Signed   By: Elgie Collard M.D.   On: 09/05/2019 21:45  Assessment/Plan:  58 y.o. male with small bowel obstruction, complicated by pertinent comorbidities including CHF, COPD, hypertension, coronary artery disease, pulmonary hypertension. Patient with history, physical exam and images consistent with small bowel obstruction.  Patient with previous abdominal surgeries.  Will obstruction most likely due to intra-abdominal adhesions.  Physical exam not concerning for peritonitis.  Patient stable.  I agree with conservative management with nasogastric tube, IV fluids, bowel rest.  I will order Gastrografin study for further evaluation of the small bowel obstruction.  Will discuss with hospitalist if warfarin can be changed to lower half-life anticoagulation in case patient need surgical management during admission.  I discussed with the patient that most of the cases resolve with nasogastric tube as he had done in the past.  I also talked to the patient that if this does not resolve he will need surgical management will in case he is high risk due to his comorbidities and previous abdominal surgeries.  Will follow Gastrografin challenge and patient with physical exam.  Gae Gallop, MD

## 2019-09-06 NOTE — ED Notes (Addendum)
Pt c/o sore throat and abd pain ( hospitalist notified ). Pt was assisted to the restroom by this RN.

## 2019-09-06 NOTE — Progress Notes (Signed)
PROGRESS NOTE    Brett Fernandez  PIR:[id number] DOB: Sep 17, 1960 DOA: 09/05/2019 PCP: Dortha Kern, MD    Brief Narrative:  Brett Fernandez  is a 58 y.o. Caucasian male with a known history of CHF, BPH, COPD, GERD and peptic ulcer disease as well as hypertension and hypothyroidism, presented to the emergency room with acute onset of diarrhea that started 4 days ago as well as nausea and vomiting that started yesterday with associated abdominal pain in the left upper quadrant as well as the infraumbilical area and right lower quadrant.  He has been vomiting even fluids.  He has been having tactile fever and chills.  He admits to body aches as well as rhinorrhea over the last couple of days.  He denies any loss of taste or smell.  No recent COVID-19 exposure.  He states that his urine has been dark with diminished urine output.  He stated that he had a fall on 12/1 with subsequent right-sided ninth and 10th rib fractures which has been associated with cough worsening his pain and expectoration of greenish sputum.  No dyspnea or hemoptysis or palpitations.  Upon presentation to the emergency room, vital signs were within normal except for blood pressure 147/80 and has been as high as 149/109 and heart rate later on was 120.  Labs revealed a BUN of 13 and creatinine 1.2 up from 0.87 on 08/28/2018.  Serum lipase was only 25.  CBC showed significant concentration with hemoglobin of 18.6 and hematocrit 56 and his WBC was 10.7.  Influenza AMB antigen came back negative.  COVID-19 PCR came back negative.  Urinalysis showed 6-10 RBCs with 0-5 WBCs and rare bacteria with 20 ketones, 100 protein and specific gravity 1021.  Two-view abdomen x-ray initially showed findings concerning for small bowel obstruction or ileus with probable ascites, emphysema with no acute cardiopulmonary disease.  Abdominal pelvic CT scan revealed dilated small bowel loops without definite transition, likely representing ileus versus less  likely a small bowel obstruction.  Acute sigmoid diverticulosis with possible mild acute diverticulitis.  It also showed bilateral nonobstructing renal calculi with no hydronephrosis and aortic atherosclerosis..  Patient was given 50 mcg of IV fentanyl, 4 mg of IV Zofran twice and 4 mg p.o. as well as 1 L bolus of IV normal saline.  An NG tube was placed.  X-ray initially showed the tip to be in the distal esophagus and after being pushed repeat x-ray showed in the side port projecting over the stomach.  He will be admitted to a medical bed for further evaluation and management.  12/22: No status changes.  NG tube in place.  Minimal abdominal pain.  Endorsing dry mouth.  Seen by surgery.  No current surgical intervention recommended.   Assessment & Plan:   Active Problems:   Small bowel obstruction (HCC)  1.  Ileus and possibly small bowel obstruction.   Imaging and physical exam consistent with early small bowel obstruction Patient seen by general surgery Currently recommending conservative management with NG tube, bowel rest, IV fluids We will continue supportive care at this time Switch chronic warfarin to heparin GTT in case any surgical intervention is warranted in addition  2.  Hypertension.   Continue home metoprolol As needed IV labetalol  3.  Hypothyroidism.  Continue Synthroid  4.  Coronary artery disease.   Continue Imdur and as needed sublingual glycerin.  5.  Dyslipidemia.   continue statin therapy.  6.  Pulmonary hypertension.   We will continue Revatio.  7.  Lower extremity radiculopathy.   Lyrica will be resumed.  8.  Ongoing heavy tobacco abuse.   Counseled the patient for smoking cessation and he will receive further counseling here.   Nicotine patch  9.  History of recurrent DVTs.  Chronic anticoagulation Patient is chronically on warfarin for history of recurrent DVTs in the setting of pulmonary hypertension Given the need for potential surgical  intervention during this hospitalization we will discontinue warfarin Start heparin GTT Restart warfarin once is confirmed no surgical management is required  DVT prophylaxis: Heparin GTT Code Status: Full Family Communication: None Disposition Plan: Anticipate home  Consultants:   General surgery   Procedures: NG tube placement   Antimicrobials: None   Subjective: Seen and examined No acute events No new complaints NG tube in place  Objective: Vitals:   09/06/19 0145 09/06/19 0350 09/06/19 0500 09/06/19 0552  BP: (!) 157/109 (!) 176/97  (!) 143/92  Pulse: (!) 103 (!) 102  (!) 116  Resp: 19   18  Temp: 97.9 F (36.6 C)   98.2 F (36.8 C)  TempSrc: Oral     SpO2: 95%   94%  Weight:   79.6 kg   Height:   5\' 8"  (1.727 m)     Intake/Output Summary (Last 24 hours) at 09/06/2019 1348 Last data filed at 09/06/2019 1243 Gross per 24 hour  Intake 287.4 ml  Output 1500 ml  Net -1212.6 ml   Filed Weights   09/05/19 2017 09/06/19 0500  Weight: 81.6 kg 79.6 kg    Examination:  General exam: Appears calm and comfortable  Respiratory system: Clear to auscultation. Respiratory effort normal. Cardiovascular system: S1 & S2 heard, RRR. No JVD, murmurs, rubs, gallops or clicks. No pedal edema. Gastrointestinal system: Abdomen is nondistended, soft and nontender. No organomegaly or masses felt. Normal bowel sounds heard. Central nervous system: Alert and oriented. No focal neurological deficits. Extremities: Symmetric 5 x 5 power. Skin: No rashes, lesions or ulcers Psychiatry: Judgement and insight appear normal. Mood & affect appropriate.     Data Reviewed: I have personally reviewed following labs and imaging studies  CBC: Recent Labs  Lab 09/05/19 1637 09/06/19 0558  WBC 10.7* 9.7  HGB 18.6* 16.5  HCT 56.0* 50.0  MCV 94.3 94.7  PLT 271 256   Basic Metabolic Panel: Recent Labs  Lab 09/05/19 1637 09/06/19 0558  NA 139 141  K 4.1 3.8  CL 101 104  CO2  20* 22  GLUCOSE 133* 126*  BUN 13 19  CREATININE 1.20 1.14  CALCIUM 10.3 9.1   GFR: Estimated Creatinine Clearance: 68.3 mL/min (by C-G formula based on SCr of 1.14 mg/dL). Liver Function Tests: Recent Labs  Lab 09/05/19 1637  AST 23  ALT 22  ALKPHOS 87  BILITOT 1.0  PROT 9.2*  ALBUMIN 4.6   Recent Labs  Lab 09/05/19 1637  LIPASE 25   No results for input(s): AMMONIA in the last 168 hours. Coagulation Profile: Recent Labs  Lab 09/06/19 0756  INR 1.7*   Cardiac Enzymes: No results for input(s): CKTOTAL, CKMB, CKMBINDEX, TROPONINI in the last 168 hours. BNP (last 3 results) No results for input(s): PROBNP in the last 8760 hours. HbA1C: No results for input(s): HGBA1C in the last 72 hours. CBG: No results for input(s): GLUCAP in the last 168 hours. Lipid Profile: No results for input(s): CHOL, HDL, LDLCALC, TRIG, CHOLHDL, LDLDIRECT in the last 72 hours. Thyroid Function Tests: No results for input(s): TSH, T4TOTAL, FREET4, T3FREE,  THYROIDAB in the last 72 hours. Anemia Panel: No results for input(s): VITAMINB12, FOLATE, FERRITIN, TIBC, IRON, RETICCTPCT in the last 72 hours. Sepsis Labs: No results for input(s): PROCALCITON, LATICACIDVEN in the last 168 hours.  Recent Results (from the past 240 hour(s))  Respiratory Panel by RT PCR (Flu A&B, Covid) - Nasopharyngeal Swab     Status: None   Collection Time: 09/05/19  8:13 PM   Specimen: Nasopharyngeal Swab  Result Value Ref Range Status   SARS Coronavirus 2 by RT PCR NEGATIVE NEGATIVE Final    Comment: (NOTE) SARS-CoV-2 target nucleic acids are NOT DETECTED. The SARS-CoV-2 RNA is generally detectable in upper respiratoy specimens during the acute phase of infection. The lowest concentration of SARS-CoV-2 viral copies this assay can detect is 131 copies/mL. A negative result does not preclude SARS-Cov-2 infection and should not be used as the sole basis for treatment or other patient management decisions. A  negative result may occur with  improper specimen collection/handling, submission of specimen other than nasopharyngeal swab, presence of viral mutation(s) within the areas targeted by this assay, and inadequate number of viral copies (<131 copies/mL). A negative result must be combined with clinical observations, patient history, and epidemiological information. The expected result is Negative. Fact Sheet for Patients:  https://www.moore.com/https://www.fda.gov/media/142436/download Fact Sheet for Healthcare Providers:  https://www.young.biz/https://www.fda.gov/media/142435/download This test is not yet ap proved or cleared by the [place] FDA and  has been authorized for detection and/or diagnosis of SARS-CoV-2 by FDA under an Emergency Use Authorization (EUA). This EUA will remain  in effect (meaning this test can be used) for the duration of the COVID-19 declaration under Section 564(b)(1) of the Act, 21 U.S.C. section 360bbb-3(b)(1), unless the authorization is terminated or revoked sooner.    Influenza A by PCR NEGATIVE NEGATIVE Final   Influenza B by PCR NEGATIVE NEGATIVE Final    Comment: (NOTE) The Xpert Xpress SARS-CoV-2/FLU/RSV assay is intended as an aid in  the diagnosis of influenza from Nasopharyngeal swab specimens and  should not be used as a sole basis for treatment. Nasal washings and  aspirates are unacceptable for Xpert Xpress SARS-CoV-2/FLU/RSV  testing. Fact Sheet for Patients: https://www.moore.com/https://www.fda.gov/media/142436/download Fact Sheet for Healthcare Providers: https://www.young.biz/https://www.fda.gov/media/142435/download This test is not yet approved or cleared by the [place] FDA and  has been authorized for detection and/or diagnosis of SARS-CoV-2 by  FDA under an Emergency Use Authorization (EUA). This EUA will remain  in effect (meaning this test can be used) for the duration of the  Covid-19 declaration under Section 564(b)(1) of the Act, 21  U.S.C. section 360bbb-3(b)(1), unless the authorization is    terminated or revoked. Performed at [facility], [address] [place], [place] [id number]          Radiology Studies: CT ABDOMEN PELVIS W CONTRAST  Result Date: 09/05/2019 CLINICAL DATA:  58 year old male with abdominal pain, nausea and vomiting. EXAM: CT ABDOMEN AND PELVIS WITH CONTRAST TECHNIQUE: Multidetector CT imaging of the abdomen and pelvis was performed using the standard protocol following bolus administration of intravenous contrast. CONTRAST:  100mL OMNIPAQUE IOHEXOL 300 MG/ML  SOLN COMPARISON:  CT of the abdomen pelvis dated 08/23/2018. FINDINGS: Lower chest: Mild emphysema. The visualized lung bases are otherwise clear. No intra-abdominal free air or free fluid. Hepatobiliary: The liver is unremarkable. No intrahepatic biliary ductal dilatation. No calcified gallstone or pericholecystic fluid. Pancreas: Unremarkable. No pancreatic ductal dilatation or surrounding inflammatory changes. Spleen: Normal in size without focal abnormality. Adrenals/Urinary Tract: The adrenal glands are  unremarkable. Multiple bilateral nonobstructing renal calculi measure up to 3-4 mm in the left kidney. There is no hydronephrosis on either side. There is symmetric enhancement and excretion of contrast by both kidneys. There is a 1 cm left renal interpolar cyst. Additional subcentimeter hypodense lesions are too small to characterize. The visualized ureters appear unremarkable. There is apparent diffuse thickening of the bladder wall which may be partly related to underdistention. Cystitis is not excluded. Correlation with urinalysis recommended. Stomach/Bowel: There is sigmoid diverticulosis. There is minimal perisigmoid stranding and haziness which may be chronic. Mild acute diverticulitis is not excluded. Clinical correlation recommended. Multiple mildly dilated loops of small bowel containing air and fluid measure up to 4 cm in caliber. There is gradual tapering of the distal small bowel loops  without a definite transition. The terminal ileum demonstrate a normal caliber. Findings may represent an ileus rather than a small-bowel obstruction. However, an apparent transition focus may be present in the mid abdomen (series 2, image 60 and coronal series 5, image 40) which may represent an area of adhesion. Small-bowel series may provide better evaluation. There is postsurgical changes of partial small bowel resection with anastomotic suture in the left hemiabdomen. Vascular/Lymphatic: Moderate aortoiliac atherosclerotic disease. The IVC is unremarkable. No portal venous gas. There is no adenopathy. Reproductive: The prostate and seminal vesicles are grossly unremarkable. No pelvic mass. Partially visualized small right hydrocele. Other: None Musculoskeletal: No acute or significant osseous findings. IMPRESSION: 1. Dilated loops of small bowel without a definite transition, likely represent ileus versus less likely a small-bowel obstruction. Small-bowel series may provide better evaluation. 2. Sigmoid diverticulosis with possible mild acute diverticulitis. Clinical correlation recommended. No abscess. 3. Bilateral nonobstructing renal calculi. No hydronephrosis. 4. Aortic Atherosclerosis (ICD10-I70.0). Electronically Signed   By: Anner Crete M.D.   On: 09/05/2019 21:45   DG Abdomen Acute W/Chest  Result Date: 09/05/2019 CLINICAL DATA:  58 year old male with abdominal pain, nausea vomiting. EXAM: DG ABDOMEN ACUTE W/ 1V CHEST COMPARISON:  Radiograph dated 08/28/2018. FINDINGS: There is background of emphysema and chronic bronchitic changes. No focal consolidation, pleural effusion, pneumothorax. The cardiac silhouette is within normal limits. There is atherosclerotic calcification of the aorta. Multiple mildly dilated loops of small bowel with air-fluid level measure up to 4 cm in caliber most consistent with obstruction or ileus. There is distention of the abdomen with central positioning of the  small bowel suggestive of ascites. No free air identified. The osseous structures and soft tissues are grossly unremarkable. IMPRESSION: 1. Findings concerning for small bowel obstruction or ileus. 2. Probable ascites. 3. No acute cardiopulmonary process. 4. Emphysema. Electronically Signed   By: Anner Crete M.D.   On: 09/05/2019 20:07   DG Abd Portable 1 View  Result Date: 09/06/2019 CLINICAL DATA:  Nasogastric tube placement EXAM: PORTABLE ABDOMEN - 1 VIEW COMPARISON:  09/05/2019 FINDINGS: Nasogastric tube side port and tip project over the stomach. Visualized lung bases are clear. Nonobstructive visualized bowel gas pattern. IMPRESSION: Nasogastric tube tip and side port project over the stomach. Electronically Signed   By: Ulyses Jarred M.D.   On: 09/06/2019 00:18   DG Abd Portable 1 View  Result Date: 09/05/2019 CLINICAL DATA:  NG tube EXAM: PORTABLE ABDOMEN - 1 VIEW COMPARISON:  None. FINDINGS: Tip the NG tube is seen projecting into the distal esophagus. There is air seen in mildly dilated loops of small bowel in the mid abdomen. IMPRESSION: Tip the NG tube in the distal esophagus. Electronically Signed   By:  Jonna ClarkBindu  Avutu M.D.   On: 09/05/2019 23:32        Scheduled Meds: . atomoxetine  80 mg Oral Daily  . DULoxetine  120 mg Oral Daily  . finasteride  5 mg Oral Daily  . fluticasone  2 spray Each Nare Daily  . isosorbide mononitrate  30 mg Oral Daily  . levothyroxine  50 mcg Oral QAC breakfast  . loratadine  10 mg Oral Daily  . metoprolol succinate  50 mg Oral Daily  . mirtazapine  15 mg Oral Daily  . nicotine  21 mg Transdermal Daily  . pantoprazole  40 mg Oral Daily  . polyethylene glycol  17 g Oral Daily  . potassium citrate  20 mEq Oral BID  . pravastatin  80 mg Oral q1800  . pregabalin  300 mg Oral BID  . tamsulosin  0.4 mg Oral Daily  . traZODone  300 mg Oral QHS  . ziprasidone  60 mg Oral BID WC   Continuous Infusions: . sodium chloride 100 mL/hr at 09/06/19  1243  . heparin 1,200 Units/hr (09/06/19 1232)     LOS: 1 day    Time spent: 35 minutes    Tresa MooreSudheer B Clavin Ruhlman, MD [facility] Pager [phone]  If 7PM-7AM, please contact night-coverage [email or web address] Password [email or web address] 09/06/2019, 1:48 PM

## 2019-09-07 ENCOUNTER — Inpatient Hospital Stay: Payer: Medicaid Other

## 2019-09-07 LAB — CBC
HCT: 45.3 % (ref 39.0–52.0)
Hemoglobin: 14.7 g/dL (ref 13.0–17.0)
MCH: 31.5 pg (ref 26.0–34.0)
MCHC: 32.5 g/dL (ref 30.0–36.0)
MCV: 97 fL (ref 80.0–100.0)
Platelets: 226 10*3/uL (ref 150–400)
RBC: 4.67 MIL/uL (ref 4.22–5.81)
RDW: 13.7 % (ref 11.5–15.5)
WBC: 9.4 10*3/uL (ref 4.0–10.5)
nRBC: 0 % (ref 0.0–0.2)

## 2019-09-07 LAB — HEPARIN LEVEL (UNFRACTIONATED)
Heparin Unfractionated: 0.7 IU/mL (ref 0.30–0.70)
Heparin Unfractionated: 0.44 IU/mL (ref 0.30–0.70)
Heparin Unfractionated: 0.31 IU/mL (ref 0.30–0.70)
Heparin Unfractionated: 0.35 IU/mL (ref 0.30–0.70)

## 2019-09-07 MED ORDER — GUAIFENESIN-DM 100-10 MG/5ML PO SYRP: 5.0000 mL | ORAL_SOLUTION | ORAL | Status: DC | PRN

## 2019-09-07 NOTE — Consult Note (Signed)
ANTICOAGULATION CONSULT NOTE  Pharmacy Consult for heparin Indication: VTE prophylaxis  Patient Measurements: Height: 5\' 8"  (172.7 cm) Weight: 175 lb 7.8 oz (79.6 kg) IBW/kg (Calculated) : 68.4  Vital Signs: Temp: 99 F (37.2 C) (12/23 0444) Temp Source: Oral (12/23 0444) BP: 110/71 (12/23 0444) Pulse Rate: 104 (12/23 0444)  Labs: Recent Labs    09/05/19 1637 09/06/19 0558 09/06/19 0756 09/06/19 1916 09/07/19 0019 09/07/19 0457  HGB 18.6* 16.5  --   --   --  14.7  HCT 56.0* 50.0  --   --   --  45.3  PLT 271 256  --   --   --  226  LABPROT  --   --  20.2*  --   --   --   INR  --   --  1.7*  --   --   --   HEPARINUNFRC  --   --   --  0.11* 0.70 0.44  CREATININE 1.20 1.14  --   --   --   --     Estimated Creatinine Clearance: 68.3 mL/min (by C-G formula based on SCr of 1.14 mg/dL).   Medical History: Past Medical History:  Diagnosis Date  . Allergy   . Anginal pain (HCC)   . Bipolar disorder (HCC)   . BPH (benign prostatic hypertrophy)   . CHF (congestive heart failure) (HCC)   . COPD (chronic obstructive pulmonary disease) (HCC)   . COPD exacerbation (HCC) 11/02/2015  . Coronary artery disease   . Deep vein thrombosis (DVT) (HCC)   . DJD (degenerative joint disease)   . Gastric ulcer   . GERD (gastroesophageal reflux disease)   . History of hiatal hernia   . Hypertension   . Hypothyroidism   . Kidney stones   . Lethargy 11/02/2015  . Neuromuscular disorder (HCC)   . PTSD (post-traumatic stress disorder)     Medications:  Scheduled:  . atomoxetine  80 mg Oral Daily  . DULoxetine  120 mg Oral Daily  . finasteride  5 mg Oral Daily  . fluticasone  2 spray Each Nare Daily  . isosorbide mononitrate  30 mg Oral Daily  . levothyroxine  50 mcg Oral QAC breakfast  . loratadine  10 mg Oral Daily  . metoprolol succinate  50 mg Oral Daily  . mirtazapine  15 mg Oral Daily  . nicotine  21 mg Transdermal Daily  . pantoprazole  40 mg Oral Daily  . polyethylene  glycol  17 g Oral Daily  . potassium citrate  20 mEq Oral BID  . pravastatin  80 mg Oral q1800  . pregabalin  300 mg Oral BID  . tamsulosin  0.4 mg Oral Daily  . traZODone  300 mg Oral QHS  . ziprasidone  60 mg Oral BID WC    Assessment: 58 y.o. Caucasian male with a known history of CHF, BPH, COPD, GERD and peptic ulcer disease as well as hypertension and hypothyroidism, presented to the emergency room with a SBO. He takes 4 mg warfarin daily for a h/o DVTs and his last dose reported to me was 12/19. He missed several doses due to nausea. H&H trending down, PLT wnl. He is being switched to heparin in case of need for surgical intervention  Goal of Therapy:  Heparin level 0.3 - 0.7 units/mL Monitor platelets by anticoagulation protocol: Yes   Plan:   Dr 1/20 does not want the initial bolus since INR is 1.7  Start heparin infusion at  1200 units/hr  Check heparin level 6 hours after infusion begins  CBC in am  12/22:  HL @ 1916 = 0.11 12/23 0457 HL = 0.44, therapeutic x 1, CBC OK.  Will continue heparin at 1500 units/hr. Will recheck HL in 6 hrs to confirm.  [place] Crane, Shaquon Gropp A 09/07/2019,6:33 AM

## 2019-09-07 NOTE — Consult Note (Signed)
ANTICOAGULATION CONSULT NOTE  Pharmacy Consult for heparin Indication: VTE prophylaxis  Patient Measurements: Height: 5\' 8"  (172.7 cm) Weight: 175 lb 7.8 oz (79.6 kg) IBW/kg (Calculated) : 68.4  Vital Signs: Temp: 98.3 F (36.8 C) (12/23 2056) Temp Source: Oral (12/23 2056) BP: 118/77 (12/23 2056) Pulse Rate: 90 (12/23 2056)  Labs: Recent Labs    09/05/19 1637 09/05/19 1637 09/06/19 0558 09/06/19 0756 09/07/19 0457 09/07/19 1055 09/07/19 2132  HGB 18.6*  --  16.5  --  14.7  --   --   HCT 56.0*  --  50.0  --  45.3  --   --   PLT 271  --  256  --  226  --   --   LABPROT  --   --   --  20.2*  --   --   --   INR  --   --   --  1.7*  --   --   --   HEPARINUNFRC  --    < >  --   --  0.44 0.31 0.35  CREATININE 1.20  --  1.14  --   --   --   --    < > = values in this interval not displayed.    Estimated Creatinine Clearance: 68.3 mL/min (by C-G formula based on SCr of 1.14 mg/dL).   Medical History: Past Medical History:  Diagnosis Date  . Allergy   . Anginal pain ([place])   . Bipolar disorder ([place])   . BPH (benign prostatic hypertrophy)   . CHF (congestive heart failure) ([place])   . COPD (chronic obstructive pulmonary disease) ([place])   . COPD exacerbation ([place]) 11/02/2015  . Coronary artery disease   . Deep vein thrombosis (DVT) ([place])   . DJD (degenerative joint disease)   . Gastric ulcer   . GERD (gastroesophageal reflux disease)   . History of hiatal hernia   . Hypertension   . Hypothyroidism   . Kidney stones   . Lethargy 11/02/2015  . Neuromuscular disorder ([place])   . PTSD (post-traumatic stress disorder)     Medications:  Scheduled:  . atomoxetine  80 mg Oral Daily  . DULoxetine  120 mg Oral Daily  . finasteride  5 mg Oral Daily  . fluticasone  2 spray Each Nare Daily  . isosorbide mononitrate  30 mg Oral Daily  . levothyroxine  50 mcg Oral QAC breakfast  . loratadine  10 mg Oral Daily  . metoprolol succinate  50 mg Oral Daily  . mirtazapine  15 mg Oral  Daily  . nicotine  21 mg Transdermal Daily  . pantoprazole  40 mg Oral Daily  . polyethylene glycol  17 g Oral Daily  . potassium citrate  20 mEq Oral BID  . pravastatin  80 mg Oral q1800  . pregabalin  300 mg Oral BID  . tamsulosin  0.4 mg Oral Daily  . traZODone  300 mg Oral QHS  . ziprasidone  60 mg Oral BID WC    Assessment: 58 y.o. Caucasian male with a known history of CHF, BPH, COPD, GERD and peptic ulcer disease as well as hypertension and hypothyroidism, presented to the emergency room with a SBO. He takes 4 mg warfarin daily for a h/o DVTs and his last dose reported to me was 12/19. He missed several doses due to nausea. H&H trending down, PLT wnl. He is being switched to heparin in case of need for surgical intervention  Heparin Course: 12/22  initiation 1200 units/hr 12/22 1916 HL 0.11: inc to 1500 units/hr 12/23 0457 HL 0.44: no change 12/23 1055 HL 0.31: inc to 1600 units/hr 12/23 2132 HL 0.35: continue current rate  Goal of Therapy:  Heparin level 0.3 - 0.7 units/mL Monitor platelets by anticoagulation protocol: Yes   Plan:   Heparin level is therapeutic, continue rate at 1600 units/hr  Infusion is scheduled to stop on 12/24 at 0500  CBC in am   Valrie Hart A 09/07/2019,10:30 PM

## 2019-09-07 NOTE — Progress Notes (Addendum)
Surgery Follow up Note  Patient was reevaluated this afternoon.  He reported that he is passing a lot of gas.  He denied nausea or vomiting.  There is no pain on the abdomen.  There is no pain radiation.  There is no alleviating or aggravating factor.  Vitals:   09/07/19 0444 09/07/19 1320  BP: 110/71 100/74  Pulse: (!) 104 83  Resp: 20   Temp: 99 F (37.2 C) 98.3 F (36.8 C)  SpO2: 90% 91%   Physical exam:  General: Alert, oriented x3 Abdomen: The abdomen continue to be distended, soft and depressible, nontender to palpation.  Assessment and plan: Patient was evaluated because x-ray shows persistent dilation of the small intestine.  This is 24-hour after the Gastrografin challenge.  The Gastrografin is not being able to be seen on the x-ray.  At this point the Gastrografin challenge is nondiagnostic.  After this persistent dilation I offered the patient surgical management for small bowel obstruction.  He is very concerned about the surgery and he reported he passed a large amount of gas sitting on the toilet.  Due to these finding I will give more time to the patient to see if his small bowel obstruction resolved with conservative management.  If he continue with persistent dilation tomorrow I will discuss with him the need of surgical management.  Since patient is not with peritoneal sign continuing conservative management is appropriate.  We will continue to follow closely.  This encounter was more than 30-minute most of the time counseling the patient and coordinating plan of care.  Herbert Pun, MD

## 2019-09-07 NOTE — Consult Note (Signed)
ANTICOAGULATION CONSULT NOTE  Pharmacy Consult for heparin Indication: VTE prophylaxis  Patient Measurements: Height: 5\' 8"  (172.7 cm) Weight: 175 lb 7.8 oz (79.6 kg) IBW/kg (Calculated) : 68.4  Vital Signs: Temp: 99 F (37.2 C) (12/23 0444) Temp Source: Oral (12/23 0444) BP: 110/71 (12/23 0444) Pulse Rate: 104 (12/23 0444)  Labs: Recent Labs    09/05/19 1637 09/06/19 0558 09/06/19 0756 09/06/19 1916 09/07/19 0019 09/07/19 0457  HGB 18.6* 16.5  --   --   --  14.7  HCT 56.0* 50.0  --   --   --  45.3  PLT 271 256  --   --   --  226  LABPROT  --   --  20.2*  --   --   --   INR  --   --  1.7*  --   --   --   HEPARINUNFRC  --   --   --  0.11* 0.70 0.44  CREATININE 1.20 1.14  --   --   --   --     Estimated Creatinine Clearance: 68.3 mL/min (by C-G formula based on SCr of 1.14 mg/dL).   Medical History: Past Medical History:  Diagnosis Date  . Allergy   . Anginal pain ([place])   . Bipolar disorder ([place])   . BPH (benign prostatic hypertrophy)   . CHF (congestive heart failure) ([place])   . COPD (chronic obstructive pulmonary disease) ([place])   . COPD exacerbation ([place]) 11/02/2015  . Coronary artery disease   . Deep vein thrombosis (DVT) ([place])   . DJD (degenerative joint disease)   . Gastric ulcer   . GERD (gastroesophageal reflux disease)   . History of hiatal hernia   . Hypertension   . Hypothyroidism   . Kidney stones   . Lethargy 11/02/2015  . Neuromuscular disorder ([place])   . PTSD (post-traumatic stress disorder)     Medications:  Scheduled:  . atomoxetine  80 mg Oral Daily  . DULoxetine  120 mg Oral Daily  . finasteride  5 mg Oral Daily  . fluticasone  2 spray Each Nare Daily  . isosorbide mononitrate  30 mg Oral Daily  . levothyroxine  50 mcg Oral QAC breakfast  . loratadine  10 mg Oral Daily  . metoprolol succinate  50 mg Oral Daily  . mirtazapine  15 mg Oral Daily  . nicotine  21 mg Transdermal Daily  . pantoprazole  40 mg Oral Daily  . polyethylene  glycol  17 g Oral Daily  . potassium citrate  20 mEq Oral BID  . pravastatin  80 mg Oral q1800  . pregabalin  300 mg Oral BID  . tamsulosin  0.4 mg Oral Daily  . traZODone  300 mg Oral QHS  . ziprasidone  60 mg Oral BID WC    Assessment: 58 y.o. Caucasian male with a known history of CHF, BPH, COPD, GERD and peptic ulcer disease as well as hypertension and hypothyroidism, presented to the emergency room with a SBO. He takes 4 mg warfarin daily for a h/o DVTs and his last dose reported to me was 12/19. He missed several doses due to nausea. H&H trending down, PLT wnl. He is being switched to heparin in case of need for surgical intervention  Heparin Course: 12/22 initiation 1200 units/hr 12/22 1916 HL 0.11: inc to 1500 units/hr 12/23 0457 HL 0.44: no change 12/23 1055 HL 0.31: inc to 1600 units/hr  Goal of Therapy:  Heparin level 0.3 - 0.7  units/mL Monitor platelets by anticoagulation protocol: Yes   Plan:   Heparin level is therapeutic but barely so: increase rate to 1600 units/hr  Check heparin level 6 hours after rate change  CBC in am   Lowella Bandy 09/07/2019,7:11 AM

## 2019-09-07 NOTE — Progress Notes (Signed)
PROGRESS NOTE    Towanda MalkinMatthew J Fossum  ZOX:[id number] DOB: 07-09-1961 DOA: 09/05/2019 PCP: Dortha KernBliss, Laura K, MD   Brief Narrative:  58 year old with history of CHF, BPH, COPD, GERD, peptic ulcer disease, HTN, hypothyroidism came to ER complains of acute onset of diarrhea, nausea and vomiting.  Work-up including CT of the abdomen pelvis showed dilated small bowel loops concerning for ileus versus early small bowel obstruction, possible sigmoid diverticulitis, nonobstructing renal calculi bilaterally.   Assessment & Plan:   Principal Problem:   Small bowel obstruction (HCC) Active Problems:   Chronic obstructive pulmonary disease (HCC)   Chronic anticoagulation (Coumadin)   Chronic bronchitis (HCC)   Depression   SBO (small bowel obstruction) (HCC)   Intractable nausea vomiting secondary to ileus versus early small bowel obstruction -Conservative management with bowel rest, IV fluids and supportive care. -General surgery consulted-repeat x-ray this afternoon to determine further course of action.  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  On heparin drip, Coumadin on hold.  Coronary artery disease without chest pain -Currently chest pain-free.  Toprol-XL 50 mg daily, Imdur 30 mg daily  Bilateral nonobstructive renal stone -Already on Flomax and IV fluids.  Supportive care.  Pulmonary hypertension -Follow-up outpatient  Bilateral lower extremity radiculopathy -On Lyrica  Active tobacco use -Counseled to quit smoking.  As needed nicotine patch.   DVT prophylaxis: Heparin Code Status: Full code Family Communication: None Disposition Plan: Maintain hospital stay until his bowel function returns, still currently unable to tolerate orals and he is NPO.  Consultants:   General surgery   Subjective: Abdomen still distended.  Able to pass a little gas and a very small bowel  movement but still has feeling of nausea.  Review of Systems Otherwise negative except as per HPI, including: General: Denies fever, chills, night sweats or unintended weight loss. Resp: Denies cough, wheezing, shortness of breath. Cardiac: Denies chest pain, palpitations, orthopnea, paroxysmal nocturnal dyspnea. GI: Denies  diarrhea or constipation GU: Denies dysuria, frequency, hesitancy or incontinence MS: Denies muscle aches, joint pain or swelling Neuro: Denies headache, neurologic deficits (focal weakness, numbness, tingling), abnormal gait Psych: Denies anxiety, depression, SI/HI/AVH Skin: Denies new rashes or lesions ID: Denies sick contacts, exotic exposures, travel  Objective: Vitals:   09/06/19 0552 09/06/19 1755 09/06/19 2052 09/07/19 0444  BP: (!) 143/92 136/86 131/84 110/71  Pulse: (!) 116 (!) 117 (!) 116 (!) 104  Resp: 18 18 20 20   Temp: 98.2 F (36.8 C) 98.3 F (36.8 C) 98.7 F (37.1 C) 99 F (37.2 C)  TempSrc:  Oral Oral Oral  SpO2: 94% 93% 92% 90%  Weight:      Height:        Intake/Output Summary (Last 24 hours) at 09/07/2019 0833 Last data filed at 09/07/2019 0600 Gross per 24 hour  Intake 1967.63 ml  Output 2414 ml  Net -446.37 ml   Filed Weights   09/05/19 2017 09/06/19 0500  Weight: 81.6 kg 79.6 kg    Examination:  General exam: Appears calm and comfortable, NG tube in place Respiratory system: Clear to auscultation. Respiratory effort normal. Cardiovascular system: S1 & S2 heard, RRR. No JVD, murmurs, rubs, gallops or clicks. No pedal edema. Gastrointestinal system: Abdomen is distended with diminished bowel sounds.  No rebound tenderness Central nervous system: Alert and oriented. No focal neurological deficits. Extremities: Symmetric 5 x 5 power. Skin: No rashes, lesions or ulcers Psychiatry: Judgement and insight appear normal. Mood &  affect appropriate.   Data Reviewed:   CBC: Recent Labs  Lab 09/05/19 1637 09/06/19 0558  09/07/19 0457  WBC 10.7* 9.7 9.4  HGB 18.6* 16.5 14.7  HCT 56.0* 50.0 45.3  MCV 94.3 94.7 97.0  PLT 271 256 573   Basic Metabolic Panel: Recent Labs  Lab 09/05/19 1637 09/06/19 0558  NA 139 141  K 4.1 3.8  CL 101 104  CO2 20* 22  GLUCOSE 133* 126*  BUN 13 19  CREATININE 1.20 1.14  CALCIUM 10.3 9.1   GFR: Estimated Creatinine Clearance: 68.3 mL/min (by C-G formula based on SCr of 1.14 mg/dL). Liver Function Tests: Recent Labs  Lab 09/05/19 1637  AST 23  ALT 22  ALKPHOS 87  BILITOT 1.0  PROT 9.2*  ALBUMIN 4.6   Recent Labs  Lab 09/05/19 1637  LIPASE 25   No results for input(s): AMMONIA in the last 168 hours. Coagulation Profile: Recent Labs  Lab 09/06/19 0756  INR 1.7*   Cardiac Enzymes: No results for input(s): CKTOTAL, CKMB, CKMBINDEX, TROPONINI in the last 168 hours. BNP (last 3 results) No results for input(s): PROBNP in the last 8760 hours. HbA1C: No results for input(s): HGBA1C in the last 72 hours. CBG: No results for input(s): GLUCAP in the last 168 hours. Lipid Profile: No results for input(s): CHOL, HDL, LDLCALC, TRIG, CHOLHDL, LDLDIRECT in the last 72 hours. Thyroid Function Tests: No results for input(s): TSH, T4TOTAL, FREET4, T3FREE, THYROIDAB in the last 72 hours. Anemia Panel: No results for input(s): VITAMINB12, FOLATE, FERRITIN, TIBC, IRON, RETICCTPCT in the last 72 hours. Sepsis Labs: No results for input(s): PROCALCITON, LATICACIDVEN in the last 168 hours.  Recent Results (from the past 240 hour(s))  Respiratory Panel by RT PCR (Flu A&B, Covid) - Nasopharyngeal Swab     Status: None   Collection Time: 09/05/19  8:13 PM   Specimen: Nasopharyngeal Swab  Result Value Ref Range Status   SARS Coronavirus 2 by RT PCR NEGATIVE NEGATIVE Final    Comment: (NOTE) SARS-CoV-2 target nucleic acids are NOT DETECTED. The SARS-CoV-2 RNA is generally detectable in upper respiratoy specimens during the acute phase of infection. The  lowest concentration of SARS-CoV-2 viral copies this assay can detect is 131 copies/mL. A negative result does not preclude SARS-Cov-2 infection and should not be used as the sole basis for treatment or other patient management decisions. A negative result may occur with  improper specimen collection/handling, submission of specimen other than nasopharyngeal swab, presence of viral mutation(s) within the areas targeted by this assay, and inadequate number of viral copies (<131 copies/mL). A negative result must be combined with clinical observations, patient history, and epidemiological information. The expected result is Negative. Fact Sheet for Patients:  PinkCheek.be Fact Sheet for Healthcare Providers:  GravelBags.it This test is not yet ap proved or cleared by the [place] FDA and  has been authorized for detection and/or diagnosis of SARS-CoV-2 by FDA under an Emergency Use Authorization (EUA). This EUA will remain  in effect (meaning this test can be used) for the duration of the COVID-19 declaration under Section 564(b)(1) of the Act, 21 U.S.C. section 360bbb-3(b)(1), unless the authorization is terminated or revoked sooner.    Influenza A by PCR NEGATIVE NEGATIVE Final   Influenza B by PCR NEGATIVE NEGATIVE Final    Comment: (NOTE) The Xpert Xpress SARS-CoV-2/FLU/RSV assay is intended as an aid in  the diagnosis of influenza from Nasopharyngeal swab specimens and  should not be used as a sole  basis for treatment. Nasal washings and  aspirates are unacceptable for Xpert Xpress SARS-CoV-2/FLU/RSV  testing. Fact Sheet for Patients: https://www.moore.com/ Fact Sheet for Healthcare Providers: https://www.young.biz/ This test is not yet approved or cleared by the [place] FDA and  has been authorized for detection and/or diagnosis of SARS-CoV-2 by  FDA under an Emergency  Use Authorization (EUA). This EUA will remain  in effect (meaning this test can be used) for the duration of the  Covid-19 declaration under Section 564(b)(1) of the Act, 21  U.S.C. section 360bbb-3(b)(1), unless the authorization is  terminated or revoked. Performed at [facility], [address], [place], [place] [address]          Radiology Studies: CT ABDOMEN PELVIS W CONTRAST  Result Date: 09/05/2019 CLINICAL DATA:  58 year old male with abdominal pain, nausea and vomiting. EXAM: CT ABDOMEN AND PELVIS WITH CONTRAST TECHNIQUE: Multidetector CT imaging of the abdomen and pelvis was performed using the standard protocol following bolus administration of intravenous contrast. CONTRAST:  OMNIPAQUE IOHEXOL 300 MG/ML  SOLN COMPARISON:  CT of the abdomen pelvis dated 08/23/2018. FINDINGS: Lower chest: Mild emphysema. The visualized lung bases are otherwise clear. No intra-abdominal free air or free fluid. Hepatobiliary: The liver is unremarkable. No intrahepatic biliary ductal dilatation. No calcified gallstone or pericholecystic fluid. Pancreas: Unremarkable. No pancreatic ductal dilatation or surrounding inflammatory changes. Spleen: Normal in size without focal abnormality. Adrenals/Urinary Tract: The adrenal glands are unremarkable. Multiple bilateral nonobstructing renal calculi measure up to 3-4 mm in the left kidney. There is no hydronephrosis on either side. There is symmetric enhancement and excretion of contrast by both kidneys. There is a 1 cm left renal interpolar cyst. Additional subcentimeter hypodense lesions are too small to characterize. The visualized ureters appear unremarkable. There is apparent diffuse thickening of the bladder wall which may be partly related to underdistention. Cystitis is not excluded. Correlation with urinalysis recommended. Stomach/Bowel: There is sigmoid diverticulosis. There is minimal perisigmoid stranding and haziness which may be  chronic. Mild acute diverticulitis is not excluded. Clinical correlation recommended. Multiple mildly dilated loops of small bowel containing air and fluid measure up to 4 cm in caliber. There is gradual tapering of the distal small bowel loops without a definite transition. The terminal ileum demonstrate a normal caliber. Findings may represent an ileus rather than a small-bowel obstruction. However, an apparent transition focus may be present in the mid abdomen (series 2, image 60 and coronal series 5, image 40) which may represent an area of adhesion. Small-bowel series may provide better evaluation. There is postsurgical changes of partial small bowel resection with anastomotic suture in the left hemiabdomen. Vascular/Lymphatic: Moderate aortoiliac atherosclerotic disease. The IVC is unremarkable. No portal venous gas. There is no adenopathy. Reproductive: The prostate and seminal vesicles are grossly unremarkable. No pelvic mass. Partially visualized small right hydrocele. Other: None Musculoskeletal: No acute or significant osseous findings. IMPRESSION: 1. Dilated loops of small bowel without a definite transition, likely represent ileus versus less likely a small-bowel obstruction. Small-bowel series may provide better evaluation. 2. Sigmoid diverticulosis with possible mild acute diverticulitis. Clinical correlation recommended. No abscess. 3. Bilateral nonobstructing renal calculi. No hydronephrosis. 4. Aortic Atherosclerosis (ICD10-I70.0). Electronically Signed   By: Elgie Collard M.D.   On: 09/05/2019 21:45   DG Abdomen Acute W/Chest  Result Date: 09/05/2019 CLINICAL DATA:  58 year old male with abdominal pain, nausea vomiting. EXAM: DG ABDOMEN ACUTE W/ 1V CHEST COMPARISON:  Radiograph dated 08/28/2018. FINDINGS: There is background of emphysema and  chronic bronchitic changes. No focal consolidation, pleural effusion, pneumothorax. The cardiac silhouette is within normal limits. There is  atherosclerotic calcification of the aorta. Multiple mildly dilated loops of small bowel with air-fluid level measure up to 4 cm in caliber most consistent with obstruction or ileus. There is distention of the abdomen with central positioning of the small bowel suggestive of ascites. No free air identified. The osseous structures and soft tissues are grossly unremarkable. IMPRESSION: 1. Findings concerning for small bowel obstruction or ileus. 2. Probable ascites. 3. No acute cardiopulmonary process. 4. Emphysema. Electronically Signed   By: Elgie Collard M.D.   On: 09/05/2019 20:07   DG Abd Portable 1V-Small Bowel Obstruction Protocol-initial, 8 hr delay  Result Date: 09/06/2019 CLINICAL DATA:  8 hour delay small-bowel obstruction EXAM: PORTABLE ABDOMEN - 1 VIEW COMPARISON:  Portable exam 1930 hours compared to earlier study of 09/06/2019 FINDINGS: Persistent small bowel dilatation. Dilute contrast throughout dilated small bowel loops. Small amount of gas and contrast within proximal half of colon. Findings are consistent with partial degree of small bowel obstruction. Nasogastric tube projects over proximal stomach. No acute osseous findings. IMPRESSION: Partial small bowel obstruction with persistent dilatation of small bowel loops throughout abdomen, though some gas and contrast are present within the colon. Electronically Signed   By: Ulyses Southward M.D.   On: 09/06/2019 19:44   DG Abd Portable 1 View  Result Date: 09/06/2019 CLINICAL DATA:  Nasogastric tube placement EXAM: PORTABLE ABDOMEN - 1 VIEW COMPARISON:  09/05/2019 FINDINGS: Nasogastric tube side port and tip project over the stomach. Visualized lung bases are clear. Nonobstructive visualized bowel gas pattern. IMPRESSION: Nasogastric tube tip and side port project over the stomach. Electronically Signed   By: Deatra Robinson M.D.   On: 09/06/2019 00:18   DG Abd Portable 1 View  Result Date: 09/05/2019 CLINICAL DATA:  NG tube EXAM: PORTABLE  ABDOMEN - 1 VIEW COMPARISON:  None. FINDINGS: Tip the NG tube is seen projecting into the distal esophagus. There is air seen in mildly dilated loops of small bowel in the mid abdomen. IMPRESSION: Tip the NG tube in the distal esophagus. Electronically Signed   By: Jonna Clark M.D.   On: 09/05/2019 23:32        Scheduled Meds: . atomoxetine  80 mg Oral Daily  . DULoxetine  120 mg Oral Daily  . finasteride  5 mg Oral Daily  . fluticasone  2 spray Each Nare Daily  . isosorbide mononitrate  30 mg Oral Daily  . levothyroxine  50 mcg Oral QAC breakfast  . loratadine  10 mg Oral Daily  . metoprolol succinate  50 mg Oral Daily  . mirtazapine  15 mg Oral Daily  . nicotine  21 mg Transdermal Daily  . pantoprazole  40 mg Oral Daily  . polyethylene glycol  17 g Oral Daily  . potassium citrate  20 mEq Oral BID  . pravastatin  80 mg Oral q1800  . pregabalin  300 mg Oral BID  . tamsulosin  0.4 mg Oral Daily  . traZODone  300 mg Oral QHS  . ziprasidone  60 mg Oral BID WC   Continuous Infusions: . sodium chloride 100 mL/hr at 09/06/19 2314  . heparin 1,500 Units/hr (09/07/19 0313)     LOS: 2 days   Time spent= 35 mins    Fontaine Hehl Joline Maxcy, MD [facility]  If 7PM-7AM, please contact night-coverage  09/07/2019, 8:33 AM

## 2019-09-07 NOTE — Progress Notes (Signed)
[place] Hospital Day(s): 2.   Post op day(s):  Marland Kitchen   Interval History: Patient seen and examined, no acute events or new complaints overnight. Patient reports feeling thirsty.  He denies significant abdominal pain.  He denies nausea or vomiting.  He denies passing gas.  There is no pain radiation.  There is no alleviating or aggravating factor.  Abdominal x-ray done yesterday night.  It shows persistent small bowel dilation.  There is some air and a small amount of contrast in the colon.  There is a sign of partial small bowel obstruction.  I personally evaluated the images.  Vital signs in last 24 hours: [min-max] current  Temp:  [98.3 F (36.8 C)-99 F (37.2 C)] 99 F (37.2 C) (12/23 0444) Pulse Rate:  [104-117] 104 (12/23 0444) Resp:  [18-20] 20 (12/23 0444) BP: (110-136)/(71-86) 110/71 (12/23 0444) SpO2:  [90 %-93 %] 90 % (12/23 0444)     Height: 5\' 8"  (172.7 cm) Weight: 79.6 kg BMI (Calculated): 26.69   Physical Exam:  Constitutional: alert, cooperative and no distress  Respiratory: breathing non-labored at rest  Cardiovascular: regular rate and sinus rhythm  Gastrointestinal: soft, non-tender, and distended  Labs:  CBC Latest Ref Rng & Units 09/07/2019 09/06/2019 09/05/2019  WBC 4.0 - 10.5 K/uL 9.4 9.7 10.7(H)  Hemoglobin 13.0 - 17.0 g/dL 14.7 16.5 18.6(H)  Hematocrit 39.0 - 52.0 % 45.3 50.0 56.0(H)  Platelets 150 - 400 K/uL 226 256 271   CMP Latest Ref Rng & Units 09/06/2019 09/05/2019 08/28/2018  Glucose 70 - 99 mg/dL 126(H) 133(H) 108(H)  BUN 6 - 20 mg/dL 19 13 13   Creatinine 0.61 - 1.24 mg/dL 1.14 1.20 0.87  Sodium 135 - 145 mmol/L 141 139 138  Potassium 3.5 - 5.1 mmol/L 3.8 4.1 4.2  Chloride 98 - 111 mmol/L 104 101 107  CO2 22 - 32 mmol/L 22 20(L) 26  Calcium 8.9 - 10.3 mg/dL 9.1 10.3 8.7(L)  Total Protein 6.5 - 8.1 g/dL - 9.2(H) -  Total Bilirubin 0.3 - 1.2 mg/dL - 1.0 -  Alkaline Phos 38 - 126 U/L - 87 -  AST 15 - 41 U/L - 23 -  ALT 0 - 44  U/L - 22 -    Imaging studies: Abdominal x-ray persistent small bowel dilation.  Air and contrast seen in proximal colon.   Assessment/Plan:  58 y.o. male with small bowel obstruction, complicated by pertinent comorbidities including CHF, COPD, hypertension, coronary artery disease, pulmonary hypertension.  Patient today the patient with persistent small bowel obstruction.  Abdomen is distended.  Nontender.  No peritoneal sign.  X-ray shows a small amount of air and contrast in the proximal colon.  This is a sign of partial small bowel obstruction.  I will repeat abdominal x-ray today at noon for 24-hour assessment of Gastrografin follow-through.  Depending on these findings I will continue discussion with the patient regarding continuing conservative management versus discussion of surgical management.  Appreciate hospitalist management of medical conditions.  Patient with much better hydration with hemoglobin down to normal values.  There is no leukocytosis.  There is no fever since admission.  Electrolyte within normal limits.  We will continue with IV hydration, bowel rest, NGT.  Patient on heparin drip for anticoagulation.  Arnold Long, MD

## 2019-09-08 ENCOUNTER — Inpatient Hospital Stay: Payer: Medicaid Other

## 2019-09-08 ENCOUNTER — Encounter: Payer: Self-pay | Admitting: Family Medicine

## 2019-09-08 LAB — BASIC METABOLIC PANEL
Anion gap: 12 (ref 5–15)
BUN: 11 mg/dL (ref 6–20)
CO2: 21 mmol/L — ABNORMAL LOW (ref 22–32)
Calcium: 8 mg/dL — ABNORMAL LOW (ref 8.9–10.3)
Chloride: 105 mmol/L (ref 98–111)
Creatinine, Ser: 0.87 mg/dL (ref 0.61–1.24)
GFR calc Af Amer: >60 mL/min (ref >60)
GFR calc non Af Amer: >60 mL/min (ref >60)
Glucose, Bld: 79 mg/dL (ref 70–99)
Potassium: 3.5 mmol/L (ref 3.5–5.1)
Sodium: 138 mmol/L (ref 135–145)

## 2019-09-08 LAB — CBC
HCT: 39.6 % (ref 39.0–52.0)
Hemoglobin: 12.8 g/dL — ABNORMAL LOW (ref 13.0–17.0)
MCH: 31.5 pg (ref 26.0–34.0)
MCHC: 32.3 g/dL (ref 30.0–36.0)
MCV: 97.5 fL (ref 80.0–100.0)
Platelets: 196 10*3/uL (ref 150–400)
RBC: 4.06 MIL/uL — ABNORMAL LOW (ref 4.22–5.81)
RDW: 13.4 % (ref 11.5–15.5)
WBC: 13.7 10*3/uL — ABNORMAL HIGH (ref 4.0–10.5)
nRBC: 0 % (ref 0.0–0.2)

## 2019-09-08 LAB — HEPARIN LEVEL (UNFRACTIONATED): Heparin Unfractionated: 0.18 IU/mL — ABNORMAL LOW (ref 0.30–0.70)

## 2019-09-08 LAB — MAGNESIUM: Magnesium: 1.7 mg/dL (ref 1.7–2.4)

## 2019-09-08 MED ORDER — MAGNESIUM SULFATE 2 GM/50ML IV SOLN
2.0000 g | Freq: Once | INTRAVENOUS | Status: AC
  Administered 2019-09-08: 2 g via INTRAVENOUS

## 2019-09-08 MED ORDER — BENZONATATE 100 MG PO CAPS: 100.0000 mg | ORAL_CAPSULE | Freq: Three times a day (TID) | ORAL | Status: DC | PRN

## 2019-09-08 MED ORDER — HEPARIN (PORCINE) 25000 UT/250ML-% IV SOLN
2000.0000 [IU]/h | INTRAVENOUS | Status: AC
  Administered 2019-09-08: 1600 [IU]/h via INTRAVENOUS
  Administered 2019-09-09: 2000 [IU]/h via INTRAVENOUS
  Administered 2019-09-09: 1900 [IU]/h via INTRAVENOUS
  Filled 2019-09-08 (×3): qty 250

## 2019-09-08 MED ORDER — HEPARIN BOLUS VIA INFUSION
2400.0000 [IU] | Freq: Once | INTRAVENOUS | Status: AC
  Administered 2019-09-08: 2400 [IU] via INTRAVENOUS
  Filled 2019-09-08: qty 2400
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
## 2019-09-08 NOTE — Progress Notes (Signed)
SURGICAL PROGRESS NOTE   Hospital Day(s): 3.   Post op day(s):  Marland Kitchen   Interval History: Patient seen and examined, no acute events or new complaints overnight. Patient reports he has passed 3 bowel movement and gas.  Denies nausea or vomiting.  Denies significant abdominal pain.  There is no pain radiation.  There is no alleviating or rating factor.  You underwent a x-ray today shows persistent small bowel dilation.  Compared to yesterday x-ray there is a better colonic air.  Vital signs in last 24 hours: [min-max] current  Temp:  [98.3 F (36.8 C)] 98.3 F (36.8 C) (12/24 0530) Pulse Rate:  [83-101] 101 (12/24 0530) Resp:  [20] 20 (12/24 0530) BP: (100-130)/(74-77) 130/76 (12/24 0530) SpO2:  [91 %-95 %] 92 % (12/24 0530)     Height: 5\' 8"  (172.7 cm) Weight: 79.6 kg BMI (Calculated): 26.69   Physical Exam:  Constitutional: alert, cooperative and no distress  Respiratory: breathing non-labored at rest  Cardiovascular: regular rate and sinus rhythm  Gastrointestinal: soft, non-tender, and mild-distended  Labs:  CBC Latest Ref Rng & Units 09/08/2019 09/07/2019 09/06/2019  WBC 4.0 - 10.5 K/uL 13.7(H) 9.4 9.7  Hemoglobin 13.0 - 17.0 g/dL 12.8(L) 14.7 16.5  Hematocrit 39.0 - 52.0 % 39.6 45.3 50.0  Platelets 150 - 400 K/uL 196 226 256   CMP Latest Ref Rng & Units 09/08/2019 09/06/2019 09/05/2019  Glucose 70 - 99 mg/dL 79 09/07/2019) 016(W)  BUN 6 - 20 mg/dL 11 19 13   Creatinine 0.61 - 1.24 mg/dL 109(N 2.35  Sodium 135 - 145 mmol/L 138 141 139  Potassium 3.5 - 5.1 mmol/L 3.5 3.8 4.1  Chloride 98 - 111 mmol/L 105 104 101  CO2 22 - 32 mmol/L 21(L) 22 20(L)  Calcium 8.9 - 10.3 mg/dL 8.0(L) 9.1 10.3  Total Protein 6.5 - 8.1 g/dL - - 9.2(H)  Total Bilirubin 0.3 - 1.2 mg/dL - - 1.0  Alkaline Phos 38 - 126 U/L - - 87  AST 15 - 41 U/L - - 23  ALT 0 - 44 U/L - - 22    Imaging studies: Comparing the 2 abdominal x-ray shown below.  The x-ray on the top done yesterday compare with the  one on the bottom done today.  Today there is more air in the transverse and descending colon and yesterday they were more air-fluid levels.  This could be a sign of slowly improving bowel obstruction despite persistent small bowel dilation.    Assessment/Plan:  58 y.o.malewith small bowel obstruction, complicated by pertinent comorbidities includingCHF, COPD, hypertension, coronary artery disease, pulmonary hypertension.  Patient  with persistent but slowly improving gas pattern on x-ray.  Patient is having bowel movement and passing gas.  I discussed with the patient that due to the x-ray finding and the slowly improving bowel obstruction surgical management should be considered.  Patient is very reluctant of undergoing surgery since he reported he has to take care of his 58 year old mother.  I discussed with him that the bowel obstruction does not resolve he will need to have surgery.  Since there is no peritoneal signs, no fever, stable vital signs and there is a better gas pattern on x-ray and clinically he reports that he is passing gas and bowel movement we can continue waiting but if this does not resolve in the next 24 to 48 hours surgery will be needed for management of small bowel obstruction.  I will restart heparin drip and continue conservative management.  I  will follow clinically and with images.  Arnold Long, MD

## 2019-09-08 NOTE — Consult Note (Signed)
ANTICOAGULATION CONSULT NOTE  Pharmacy Consult for heparin Indication: VTE prophylaxis  Patient Measurements: Height: 5\' 8"  (172.7 cm) Weight: 175 lb 7.8 oz (79.6 kg) IBW/kg (Calculated) : 68.4  Vital Signs: Temp: 100.2 F (37.9 C) (12/24 2119) Temp Source: Oral (12/24 2119) BP: 127/78 (12/24 2119) Pulse Rate: 92 (12/24 2119)  Labs: Recent Labs    09/06/19 0558 09/06/19 0756 09/07/19 0457 09/07/19 1055 09/07/19 2132 09/08/19 0605 09/08/19 1950  HGB 16.5  --  14.7  --   --  12.8*  --   HCT 50.0  --  45.3  --   --  39.6  --   PLT 256  --  226  --   --  196  --   LABPROT  --  20.2*  --   --   --   --   --   INR  --  1.7*  --   --   --   --   --   HEPARINUNFRC  --   --  0.44 0.31 0.35  --  0.18*  CREATININE 1.14  --   --   --   --  0.87  --     Estimated Creatinine Clearance: 89.5 mL/min (by C-G formula based on SCr of 0.87 mg/dL).   Medical History: Past Medical History:  Diagnosis Date  . Allergy   . Anginal pain (HCC)   . Bipolar disorder (HCC)   . BPH (benign prostatic hypertrophy)   . CHF (congestive heart failure) (HCC)   . COPD (chronic obstructive pulmonary disease) (HCC)   . COPD exacerbation (HCC) 11/02/2015  . Coronary artery disease   . Deep vein thrombosis (DVT) (HCC)   . DJD (degenerative joint disease)   . Gastric ulcer   . GERD (gastroesophageal reflux disease)   . History of hiatal hernia   . Hypertension   . Hypothyroidism   . Kidney stones   . Lethargy 11/02/2015  . Neuromuscular disorder (HCC)   . PTSD (post-traumatic stress disorder)     Medications:  Scheduled:  . atomoxetine  80 mg Oral Daily  . DULoxetine  120 mg Oral Daily  . finasteride  5 mg Oral Daily  . fluticasone  2 spray Each Nare Daily  . heparin  2,400 Units Intravenous Once  . isosorbide mononitrate  30 mg Oral Daily  . levothyroxine  50 mcg Oral QAC breakfast  . loratadine  10 mg Oral Daily  . metoprolol succinate  50 mg Oral Daily  . mirtazapine  15 mg Oral Daily   . nicotine  21 mg Transdermal Daily  . pantoprazole  40 mg Oral Daily  . polyethylene glycol  17 g Oral Daily  . potassium citrate  20 mEq Oral BID  . pravastatin  80 mg Oral q1800  . pregabalin  300 mg Oral BID  . tamsulosin  0.4 mg Oral Daily  . traZODone  300 mg Oral QHS  . ziprasidone  60 mg Oral BID WC    Assessment: 58 y.o. Caucasian male with a known history of CHF, BPH, COPD, GERD and peptic ulcer disease as well as hypertension and hypothyroidism, presented to the emergency room with a SBO. He takes 4 mg warfarin daily for a h/o DVTs and his last dose reported to me was 12/19. He missed several doses due to nausea. H&H trending down, PLT wnl. He is being switched to heparin in case of need for surgical intervention  Heparin Course: 12/22 initiation 1200 units/hr 12/22 1916 HL  0.11: inc to 1500 units/hr 12/23 0457 HL 0.44: no change 12/23 1055 HL 0.31: inc to 1600 units/hr 12/23 2132 HL 0.35: continue current rate  Goal of Therapy:  Heparin level 0.3 - 0.7 units/mL Monitor platelets by anticoagulation protocol: Yes   Plan:   Heparin drip was stopped this am 12/24 in anticipation of surgery. Surgery now on hold but may happen in next day or so depending on pt improvement. Will resume Heparin drip at 1600 units/hr per message with surgeon.  Will check HL in 6 hours.  CBC in am  12/24: HL @ 1950 = 0.18 Will order Heparin 2400 units IV X 1 bolus and increase drip rate to 1900 units/hr.  Will recheck HL 6 hrs after rate change.    Brett Fernandez D 09/08/2019,10:19 PM

## 2019-09-08 NOTE — Progress Notes (Signed)
PROGRESS NOTE    Brett Fernandez  FIE:[id number] DOB: 1961-02-14 DOA: 09/05/2019 PCP: Lynnell Jude, MD   Brief Narrative:  58 year old with history of CHF, BPH, COPD, GERD, peptic ulcer disease, HTN, hypothyroidism came to ER complains of acute onset of diarrhea, nausea and vomiting.  Work-up including CT of the abdomen pelvis showed dilated small bowel loops concerning for ileus versus early small bowel obstruction, possible sigmoid diverticulitis, nonobstructing renal calculi bilaterally.   Assessment & Plan:   Principal Problem:   Small bowel obstruction (HCC) Active Problems:   Chronic obstructive pulmonary disease (HCC)   Chronic anticoagulation (Coumadin)   Chronic bronchitis (HCC)   Depression   SBO (small bowel obstruction) (HCC)   Intractable nausea vomiting secondary to ileus versus early small bowel obstruction -Continue conservative management currently at this time with n.p.o., IV fluids, antiemetics -Abdominal x-ray from this morning reviewed. -General surgery following.  May require surgery if clinically does not show significant improvement over the next couple of days.  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  On heparin drip, Coumadin on hold  Coronary artery disease without chest pain -Currently chest pain-free.  Toprol-XL 50 mg daily, Imdur 30 mg daily  Bilateral nonobstructive renal stone -Continue Flomax and IV fluids.  Supportive care.  Pulmonary hypertension -Follow-up outpatient  Bilateral lower extremity radiculopathy -On Lyrica  Active tobacco use -Counseled to quit smoking.  As needed nicotine patch.   DVT prophylaxis: Heparin Code Status: Full code Family Communication: None Disposition Plan: Maintain hospital stay until bowel function returns in the meantime remains n.p.o. Consultants:   General surgery   Subjective: Tells  me he is passing gas and abdomen feels softer.  But still does not feel back to baseline  Review of Systems Otherwise negative except as per HPI, including: General = no fevers, chills, dizziness, malaise, fatigue HEENT/EYES = negative for pain, redness, loss of vision, double vision, blurred vision, loss of hearing, sore throat, hoarseness, dysphagia Cardiovascular= negative for chest pain, palpitation, murmurs, lower extremity swelling Respiratory/lungs= negative for shortness of breath, cough, hemoptysis, wheezing, mucus production Gastrointestinal= negative for   melena, hematemesis Genitourinary= negative for Dysuria, Hematuria, Change in Urinary Frequency MSK = Negative for arthralgia, myalgias, Back Pain, Joint swelling  Neurology= Negative for headache, seizures, numbness, tingling  Psychiatry= Negative for anxiety, depression, suicidal and homocidal ideation Allergy/Immunology= Medication/Food allergy as listed  Skin= Negative for Rash, lesions, ulcers, itching   Objective: Vitals:   09/07/19 0444 09/07/19 1320 09/07/19 2056 09/08/19 0530  BP: 110/71 100/74 118/77 130/76  Pulse: (!) 104 83 90 (!) 101  Resp: 20  20 20   Temp: 99 F (37.2 C) 98.3 F (36.8 C) 98.3 F (36.8 C) 98.3 F (36.8 C)  TempSrc: Oral Oral Oral Oral  SpO2: 90% 91% 95% 92%  Weight:      Height:        Intake/Output Summary (Last 24 hours) at 09/08/2019 1129 Last data filed at 09/08/2019 1660 Gross per 24 hour  Intake 2873.44 ml  Output 2500 ml  Net 373.44 ml   Filed Weights   09/05/19 2017 09/06/19 0500  Weight: 81.6 kg 79.6 kg    Examination:  Constitutional: Not in acute distress NG tube in place Respiratory: Clear to auscultation bilaterally Cardiovascular: Normal sinus rhythm, no rubs Abdomen: Abdomen is soft, minimal distention.  Positive bowel sounds. Musculoskeletal: No edema noted Skin: No rashes seen Neurologic: CN 2-12 grossly intact.  And nonfocal Psychiatric: Normal  judgment and insight. Alert and oriented x 3. Normal mood.     Data Reviewed:   CBC: Recent Labs  Lab 09/05/19 1637 09/06/19 0558 09/07/19 0457 09/08/19 0605  WBC 10.7* 9.7 9.4 13.7*  HGB 18.6* 16.5 14.7 12.8*  HCT 56.0* 50.0 45.3 39.6  MCV 94.3 94.7 97.0 97.5  PLT 271 256 226 196   Basic Metabolic Panel: Recent Labs  Lab 09/05/19 1637 09/06/19 0558 09/08/19 0605  NA 139 141 138  K 4.1 3.8 3.5  CL 101 104 105  CO2 20* 22 21*  GLUCOSE 133* 126* 79  BUN 13 19 11   CREATININE 1.20 1.14 0.87  CALCIUM 10.3 9.1 8.0*  MG  --   --  1.7   GFR: Estimated Creatinine Clearance: 89.5 mL/min (by C-G formula based on SCr of 0.87 mg/dL). Liver Function Tests: Recent Labs  Lab 09/05/19 1637  AST 23  ALT 22  ALKPHOS 87  BILITOT 1.0  PROT 9.2*  ALBUMIN 4.6   Recent Labs  Lab 09/05/19 1637  LIPASE 25   No results for input(s): AMMONIA in the last 168 hours. Coagulation Profile: Recent Labs  Lab 09/06/19 0756  INR 1.7*   Cardiac Enzymes: No results for input(s): CKTOTAL, CKMB, CKMBINDEX, TROPONINI in the last 168 hours. BNP (last 3 results) No results for input(s): PROBNP in the last 8760 hours. HbA1C: No results for input(s): HGBA1C in the last 72 hours. CBG: No results for input(s): GLUCAP in the last 168 hours. Lipid Profile: No results for input(s): CHOL, HDL, LDLCALC, TRIG, CHOLHDL, LDLDIRECT in the last 72 hours. Thyroid Function Tests: No results for input(s): TSH, T4TOTAL, FREET4, T3FREE, THYROIDAB in the last 72 hours. Anemia Panel: No results for input(s): VITAMINB12, FOLATE, FERRITIN, TIBC, IRON, RETICCTPCT in the last 72 hours. Sepsis Labs: No results for input(s): PROCALCITON, LATICACIDVEN in the last 168 hours.  Recent Results (from the past 240 hour(s))  Respiratory Panel by RT PCR (Flu A&B, Covid) - Nasopharyngeal Swab     Status: None   Collection Time: 09/05/19  8:13 PM   Specimen: Nasopharyngeal Swab  Result Value Ref Range Status   SARS  Coronavirus 2 by RT PCR NEGATIVE NEGATIVE Final    Comment: (NOTE) SARS-CoV-2 target nucleic acids are NOT DETECTED. The SARS-CoV-2 RNA is generally detectable in upper respiratoy specimens during the acute phase of infection. The lowest concentration of SARS-CoV-2 viral copies this assay can detect is 131 copies/mL. A negative result does not preclude SARS-Cov-2 infection and should not be used as the sole basis for treatment or other patient management decisions. A negative result may occur with  improper specimen collection/handling, submission of specimen other than nasopharyngeal swab, presence of viral mutation(s) within the areas targeted by this assay, and inadequate number of viral copies (<131 copies/mL). A negative result must be combined with clinical observations, patient history, and epidemiological information. The expected result is Negative. Fact Sheet for Patients:  https://www.moore.com/https://www.fda.gov/media/142436/download Fact Sheet for Healthcare Providers:  https://www.young.biz/https://www.fda.gov/media/142435/download This test is not yet ap proved or cleared by the [place] FDA and  has been authorized for detection and/or diagnosis of SARS-CoV-2 by FDA under an Emergency Use Authorization (EUA). This EUA will remain  in effect (meaning this test can be used) for the duration of the COVID-19 declaration under Section 564(b)(1) of the Act, 21 U.S.C. section 360bbb-3(b)(1), unless the authorization is terminated or revoked sooner.    Influenza A by PCR NEGATIVE NEGATIVE Final   Influenza  B by PCR NEGATIVE NEGATIVE Final    Comment: (NOTE) The Xpert Xpress SARS-CoV-2/FLU/RSV assay is intended as an aid in  the diagnosis of influenza from Nasopharyngeal swab specimens and  should not be used as a sole basis for treatment. Nasal washings and  aspirates are unacceptable for Xpert Xpress SARS-CoV-2/FLU/RSV  testing. Fact Sheet for Patients: https://www.moore.com/ Fact Sheet  for Healthcare Providers: https://www.young.biz/ This test is not yet approved or cleared by the [place] FDA and  has been authorized for detection and/or diagnosis of SARS-CoV-2 by  FDA under an Emergency Use Authorization (EUA). This EUA will remain  in effect (meaning this test can be used) for the duration of the  Covid-19 declaration under Section 564(b)(1) of the Act, 21  U.S.C. section 360bbb-3(b)(1), unless the authorization is  terminated or revoked. Performed at [facility], [address], [place], [place] [address]          Radiology Studies: DG Abd 2 Views  Result Date: 09/08/2019 CLINICAL DATA:  Small bowel obstruction EXAM: ABDOMEN - 2 VIEW COMPARISON:  09/07/2019 FINDINGS: NG tube is in the stomach. Dilated small bowel loops diffusely compatible with small bowel obstruction. This is unchanged since prior study gas is seen within nondistended large bowel. IMPRESSION: Persistent small bowel obstruction pattern, not significantly changed since prior study. Electronically Signed   By: Charlett Nose M.D.   On: 09/08/2019 09:23   DG Abd 2 Views  Result Date: 09/07/2019 CLINICAL DATA:  Small-bowel obstruction EXAM: ABDOMEN - 2 VIEW COMPARISON:  Plain film of the abdomen dated 09/06/2019. FINDINGS: Again noted are dilated gas-filled loops of small bowel throughout the upper abdomen and central abdomen, with associated air-fluid levels. Bowel gas is visualized within the colon. Enteric tube is appropriately positioned in the stomach. No evidence of free intraperitoneal air. Visualized osseous structures are unremarkable. IMPRESSION: 1. Persistent small bowel obstruction, with grossly stable small bowel dilatation compared to recent plain films and CT abdomen of 09/05/2019. 2. Enteric tube is appropriately positioned in the stomach. Electronically Signed   By: Bary Richard M.D.   On: 09/07/2019 13:10   DG Abd Portable 1V-Small Bowel Obstruction  Protocol-initial, 8 hr delay  Result Date: 09/06/2019 CLINICAL DATA:  8 hour delay small-bowel obstruction EXAM: PORTABLE ABDOMEN - 1 VIEW COMPARISON:  Portable exam 1930 hours compared to earlier study of 09/06/2019 FINDINGS: Persistent small bowel dilatation. Dilute contrast throughout dilated small bowel loops. Small amount of gas and contrast within proximal half of colon. Findings are consistent with partial degree of small bowel obstruction. Nasogastric tube projects over proximal stomach. No acute osseous findings. IMPRESSION: Partial small bowel obstruction with persistent dilatation of small bowel loops throughout abdomen, though some gas and contrast are present within the colon. Electronically Signed   By: Ulyses Southward M.D.   On: 09/06/2019 19:44        Scheduled Meds: . atomoxetine  80 mg Oral Daily  . DULoxetine  120 mg Oral Daily  . finasteride  5 mg Oral Daily  . fluticasone  2 spray Each Nare Daily  . isosorbide mononitrate  30 mg Oral Daily  . levothyroxine  50 mcg Oral QAC breakfast  . loratadine  10 mg Oral Daily  . metoprolol succinate  50 mg Oral Daily  . mirtazapine  15 mg Oral Daily  . nicotine  21 mg Transdermal Daily  . pantoprazole  40 mg Oral Daily  . polyethylene glycol  17 g Oral Daily  . potassium citrate  20 mEq Oral BID  . pravastatin  80 mg Oral q1800  . pregabalin  300 mg Oral BID  . tamsulosin  0.4 mg Oral Daily  . traZODone  300 mg Oral QHS  . ziprasidone  60 mg Oral BID WC   Continuous Infusions: . sodium chloride 100 mL/hr at 09/08/19 0716     LOS: 3 days   Time spent= 35 mins    Darrian Grzelak Joline Maxcy, MD [facility]  If 7PM-7AM, please contact night-coverage  09/08/2019, 11:29 AM

## 2019-09-08 NOTE — Consult Note (Signed)
ANTICOAGULATION CONSULT NOTE  Pharmacy Consult for heparin Indication: VTE prophylaxis  Patient Measurements: Height: 5\' 8"  (172.7 cm) Weight: 175 lb 7.8 oz (79.6 kg) IBW/kg (Calculated) : 68.4  Vital Signs: Temp: 99.1 F (37.3 C) (12/24 1204) Temp Source: Oral (12/24 1204) BP: 131/86 (12/24 1204) Pulse Rate: 95 (12/24 1204)  Labs: Recent Labs    09/05/19 1637 09/05/19 1637 09/06/19 0558 09/06/19 0756 09/07/19 0457 09/07/19 1055 09/07/19 2132 09/08/19 0605  HGB 18.6*  --  16.5  --  14.7  --   --  12.8*  HCT 56.0*  --  50.0  --  45.3  --   --  39.6  PLT 271  --  256  --  226  --   --  196  LABPROT  --   --   --  20.2*  --   --   --   --   INR  --   --   --  1.7*  --   --   --   --   HEPARINUNFRC  --    < >  --   --  0.44 0.31 0.35  --   CREATININE 1.20  --  1.14  --   --   --   --  0.87   < > = values in this interval not displayed.    Estimated Creatinine Clearance: 89.5 mL/min (by C-G formula based on SCr of 0.87 mg/dL).   Medical History: Past Medical History:  Diagnosis Date  . Allergy   . Anginal pain ([place])   . Bipolar disorder ([place])   . BPH (benign prostatic hypertrophy)   . CHF (congestive heart failure) ([place])   . COPD (chronic obstructive pulmonary disease) ([place])   . COPD exacerbation ([place]) 11/02/2015  . Coronary artery disease   . Deep vein thrombosis (DVT) ([place])   . DJD (degenerative joint disease)   . Gastric ulcer   . GERD (gastroesophageal reflux disease)   . History of hiatal hernia   . Hypertension   . Hypothyroidism   . Kidney stones   . Lethargy 11/02/2015  . Neuromuscular disorder ([place])   . PTSD (post-traumatic stress disorder)     Medications:  Scheduled:  . atomoxetine  80 mg Oral Daily  . DULoxetine  120 mg Oral Daily  . finasteride  5 mg Oral Daily  . fluticasone  2 spray Each Nare Daily  . isosorbide mononitrate  30 mg Oral Daily  . levothyroxine  50 mcg Oral QAC breakfast  . loratadine  10 mg Oral Daily  . metoprolol  succinate  50 mg Oral Daily  . mirtazapine  15 mg Oral Daily  . nicotine  21 mg Transdermal Daily  . pantoprazole  40 mg Oral Daily  . polyethylene glycol  17 g Oral Daily  . potassium citrate  20 mEq Oral BID  . pravastatin  80 mg Oral q1800  . pregabalin  300 mg Oral BID  . tamsulosin  0.4 mg Oral Daily  . traZODone  300 mg Oral QHS  . ziprasidone  60 mg Oral BID WC    Assessment: 58 y.o. Caucasian male with a known history of CHF, BPH, COPD, GERD and peptic ulcer disease as well as hypertension and hypothyroidism, presented to the emergency room with a SBO. He takes 4 mg warfarin daily for a h/o DVTs and his last dose reported to me was 12/19. He missed several doses due to nausea. H&H trending down, PLT wnl. He is  being switched to heparin in case of need for surgical intervention  Heparin Course: 12/22 initiation 1200 units/hr 12/22 1916 HL 0.11: inc to 1500 units/hr 12/23 0457 HL 0.44: no change 12/23 1055 HL 0.31: inc to 1600 units/hr 12/23 2132 HL 0.35: continue current rate  Goal of Therapy:  Heparin level 0.3 - 0.7 units/mL Monitor platelets by anticoagulation protocol: Yes   Plan:   Heparin drip was stopped this am 12/24 in anticipation of surgery. Surgery now on hold but may happen in next day or so depending on pt improvement. Will resume Heparin drip at 1600 units/hr per message with surgeon.  Will check HL in 6 hours.  CBC in am   Brett Fernandez A 09/08/2019,12:41 PM

## 2019-09-08 NOTE — Plan of Care (Signed)
  Problem: Education: Goal: Knowledge of General Education information will improve Description: Including pain rating scale, medication(s)/side effects and non-pharmacologic comfort measures Outcome: Progressing Pt understands POC.  He has no further questions.  Will continue to answer questions and offer support PRN.     Problem: Clinical Measurements: Goal: Ability to maintain clinical measurements within normal limits will improve Outcome: Progressing VS and labs WNL.  Awaiting AM labs to further assess. Will continue to monitor and assess.    Problem: Clinical Measurements: Goal: Will remain free from infection Outcome: Progressing S/Sx of infection monitored and assessed q-shift/ PRN, along with VS.  Pt has been afebrile thus far.     Will continue to monitor and assess.    Problem: Clinical Measurements: Goal: Respiratory complications will improve Outcome: Progressing Respiratory status monitored and assessed q-shift/ PRN, along with VS.  Pt is on room air with O2 saturations at 95% with respiration rate of 20 breaths per minute.  Pt has not expressed SOB or DOE.  Will continue to monitor and assess.    Problem: Clinical Measurements: Goal: Cardiovascular complication will be avoided Outcome: Progressing VS WNL.  Will continue to monitor and assess.    Problem: Activity: Goal: Risk for activity intolerance will decrease Outcome: Progressing Pt is a x1 stand by assist to the bathroom.  He is able to reposition herself in the bed without the assistance of RN staff.    Problem: Nutrition: Goal: Adequate nutrition will be maintained Outcome: Progressing  Pt is currently NPO per MD's orders.    Problem: Coping: Goal: Level of anxiety will decrease Outcome: Progressing Pt has not c/o anxiety related his hospitalization.  Will continue to offer support PRN.     Problem: Elimination: Goal: Will not experience complications related to bowel motility Outcome: Progressing Pt  has expressed he last had a BM this shift.  He is not endorsing constipation.  Will continue to monitor and assess.    Problem: Elimination: Goal: Will not experience complications related to urinary retention Outcome: Progressing Pt is x1 assist to the BR.  He has not c/o of abdominal distention or difficulty urinating.  Will continue to monitor and assess.    Problem: Pain Managment: Goal: General experience of comfort will improve Outcome: Progressing Pt has c/o 5-8/10 mid abdominal pain describing it as a constant dull ache.  Reiterated pain so he could adequately rate his pain.  Pt stated his pain goal this admission would be 0/10.  Discussed nonpharmacological methods to help reduce s/sx of pain.  Interventions given per pt's request and MD's orders.  Will continue to monitor and assess.    Problem: Safety: Goal: Ability to remain free from injury will improve Outcome: Progressing Instructed pt to utilize RN light for assistance.  Hourly rounds performed.  Bed in lowest position, locked with two upper side rails engaged.  Belongings and call light within reach.     Problem: Skin Integrity: Goal: Risk for impaired skin integrity will decrease Outcome: Progressing Skin integrity monitored and assessed q-shift/ PRN.  Instructed pt to turn q2 hours to prevent further skin impairment.  Tubes and drains assessed for device related pressure sores.  Dressing changes performed per MD's orders.  Will continue to monitor and assess.

## 2019-09-09 ENCOUNTER — Inpatient Hospital Stay: Payer: Medicaid Other

## 2019-09-09 DIAGNOSIS — K56609 Unspecified intestinal obstruction, unspecified as to partial versus complete obstruction: Secondary | ICD-10-CM

## 2019-09-09 LAB — URINALYSIS, ROUTINE W REFLEX MICROSCOPIC
Bacteria, UA: NONE SEEN
Bilirubin Urine: NEGATIVE
Glucose, UA: NEGATIVE mg/dL
Hgb urine dipstick: NEGATIVE
Ketones, ur: 80 mg/dL — AB
Leukocytes,Ua: NEGATIVE
Nitrite: NEGATIVE
Protein, ur: 30 mg/dL — AB
Specific Gravity, Urine: 1.012 (ref 1.005–1.030)
pH: 6 (ref 5.0–8.0)

## 2019-09-09 LAB — BASIC METABOLIC PANEL
Anion gap: 11 (ref 5–15)
BUN: 8 mg/dL (ref 6–20)
CO2: 23 mmol/L (ref 22–32)
Calcium: 8.3 mg/dL — ABNORMAL LOW (ref 8.9–10.3)
Chloride: 105 mmol/L (ref 98–111)
Creatinine, Ser: 0.88 mg/dL (ref 0.61–1.24)
GFR calc Af Amer: >60 mL/min (ref >60)
GFR calc non Af Amer: >60 mL/min (ref >60)
Glucose, Bld: 72 mg/dL (ref 70–99)
Potassium: 3.7 mmol/L (ref 3.5–5.1)
Sodium: 139 mmol/L (ref 135–145)

## 2019-09-09 LAB — CBC
HCT: 39.8 % (ref 39.0–52.0)
Hemoglobin: 12.9 g/dL — ABNORMAL LOW (ref 13.0–17.0)
MCH: 31.7 pg (ref 26.0–34.0)
MCHC: 32.4 g/dL (ref 30.0–36.0)
MCV: 97.8 fL (ref 80.0–100.0)
Platelets: 212 10*3/uL (ref 150–400)
RBC: 4.07 MIL/uL — ABNORMAL LOW (ref 4.22–5.81)
RDW: 13.3 % (ref 11.5–15.5)
WBC: 16 10*3/uL — ABNORMAL HIGH (ref 4.0–10.5)
nRBC: 0 % (ref 0.0–0.2)

## 2019-09-09 LAB — GLUCOSE, CAPILLARY
Glucose-Capillary: 149 mg/dL — ABNORMAL HIGH (ref 70–99)
Glucose-Capillary: 58 mg/dL — ABNORMAL LOW (ref 70–99)
Glucose-Capillary: 60 mg/dL — ABNORMAL LOW (ref 70–99)
Glucose-Capillary: 65 mg/dL — ABNORMAL LOW (ref 70–99)
Glucose-Capillary: 67 mg/dL — ABNORMAL LOW (ref 70–99)
Glucose-Capillary: 71 mg/dL (ref 70–99)
Glucose-Capillary: 86 mg/dL (ref 70–99)
Glucose-Capillary: 95 mg/dL (ref 70–99)

## 2019-09-09 LAB — HEPARIN LEVEL (UNFRACTIONATED)
Heparin Unfractionated: 0.46 IU/mL (ref 0.30–0.70)
Heparin Unfractionated: 0.21 IU/mL — ABNORMAL LOW (ref 0.30–0.70)
Heparin Unfractionated: 0.42 IU/mL (ref 0.30–0.70)

## 2019-09-09 LAB — MAGNESIUM: Magnesium: 2.1 mg/dL (ref 1.7–2.4)

## 2019-09-09 MED ORDER — DEXTROSE 50 % IV SOLN
INTRAVENOUS | Status: AC
  Administered 2019-09-09: 25 mL
  Filled 2019-09-09: qty 50

## 2019-09-09 MED ORDER — SODIUM CHLORIDE 0.9% FLUSH
10.0000 mL | Freq: Two times a day (BID) | INTRAVENOUS | Status: DC
  Administered 2019-09-09 – 2019-09-12 (×6): 10 mL

## 2019-09-09 MED ORDER — SODIUM CHLORIDE 0.9% FLUSH: 10.0000 mL | INTRAVENOUS | Status: DC | PRN

## 2019-09-09 MED ORDER — DEXTROSE 50 % IV SOLN
25.0000 mL | Freq: Once | INTRAVENOUS | Status: AC
  Administered 2019-09-10: 25 mL via INTRAVENOUS

## 2019-09-09 MED ORDER — DEXTROSE 50 % IV SOLN
INTRAVENOUS | Status: AC
  Filled 2019-09-09: qty 50

## 2019-09-09 NOTE — Consult Note (Signed)
ANTICOAGULATION CONSULT NOTE  Pharmacy Consult for heparin Indication: VTE prophylaxis  Patient Measurements: Height: 5\' 8"  (172.7 cm) Weight: 175 lb 7.8 oz (79.6 kg) IBW/kg (Calculated) : 68.4  Vital Signs: Temp: 98.9 F (37.2 C) (12/25 0525) Temp Source: Oral (12/25 0525) BP: 115/84 (12/25 1231) Pulse Rate: 87 (12/25 1231)  Labs: Recent Labs    09/07/19 0457 09/08/19 0605 09/08/19 1950 09/09/19 0427 09/09/19 1118  HGB 14.7 12.8*  --  12.9*  --   HCT 45.3 39.6  --  39.8  --   PLT 226 196  --  212  --   HEPARINUNFRC 0.44  --  0.18* 0.46 0.21*  CREATININE  --  0.87  --  0.88  --     Estimated Creatinine Clearance: 88.5 mL/min (by C-G formula based on SCr of 0.88 mg/dL).   Medical History: Past Medical History:  Diagnosis Date  . Allergy   . Anginal pain ([place])   . Bipolar disorder ([place])   . BPH (benign prostatic hypertrophy)   . CHF (congestive heart failure) ([place])   . COPD (chronic obstructive pulmonary disease) ([place])   . COPD exacerbation ([place]) 11/02/2015  . Coronary artery disease   . Deep vein thrombosis (DVT) ([place])   . DJD (degenerative joint disease)   . Gastric ulcer   . GERD (gastroesophageal reflux disease)   . History of hiatal hernia   . Hypertension   . Hypothyroidism   . Kidney stones   . Lethargy 11/02/2015  . Neuromuscular disorder ([place])   . PTSD (post-traumatic stress disorder)     Medications:  Scheduled:  . atomoxetine  80 mg Oral Daily  . DULoxetine  120 mg Oral Daily  . finasteride  5 mg Oral Daily  . fluticasone  2 spray Each Nare Daily  . isosorbide mononitrate  30 mg Oral Daily  . levothyroxine  50 mcg Oral QAC breakfast  . loratadine  10 mg Oral Daily  . metoprolol succinate  50 mg Oral Daily  . mirtazapine  15 mg Oral Daily  . nicotine  21 mg Transdermal Daily  . pantoprazole  40 mg Oral Daily  . polyethylene glycol  17 g Oral Daily  . potassium citrate  20 mEq Oral BID  . pravastatin  80 mg Oral q1800  . pregabalin  300  mg Oral BID  . sodium chloride flush  10-40 mL Intracatheter Q12H  . tamsulosin  0.4 mg Oral Daily  . traZODone  300 mg Oral QHS  . ziprasidone  60 mg Oral BID WC    Assessment: 58 y.o. Caucasian male with a known history of CHF, BPH, COPD, GERD and peptic ulcer disease as well as hypertension and hypothyroidism, presented to the emergency room with a SBO. He takes 4 mg warfarin daily for a h/o DVTs and his last dose reported to me was 12/19. He missed several doses due to nausea. H&H trending down, PLT wnl. He is being switched to heparin in case of need for surgical intervention  Heparin Course: 12/22 initiation 1200 units/hr 12/22 1916 HL 0.11: inc to 1500 units/hr 12/23 0457 HL 0.44: no change 12/23 1055 HL 0.31: inc to 1600 units/hr 12/23 2132 HL 0.35: continue current rate 12/24 1950 HL 0.18  12/25 0427 HL 0.46  12/25 1118 HL 0.21   Goal of Therapy:  Heparin level 0.3 - 0.7 units/mL Monitor platelets by anticoagulation protocol: Yes   Plan:  Heparin level subtherapeutic. Will increase infusion to 2000 units/hr. Recheck HL in  6 hours.  CBC daily while on heparin.    Ronnald Ramp, PharmD, BCPS 09/09/2019,12:42 PM

## 2019-09-09 NOTE — Progress Notes (Signed)
PROGRESS NOTE    Brett Fernandez  OEV:[id number] DOB: 1960-09-28 DOA: 09/05/2019 PCP: Lynnell Jude, MD   Brief Narrative:  58 year old with history of CHF, BPH, COPD, GERD, peptic ulcer disease, HTN, hypothyroidism came to ER complains of acute onset of diarrhea, nausea and vomiting.  Work-up including CT of the abdomen pelvis showed dilated small bowel loops concerning for ileus versus early small bowel obstruction, possible sigmoid diverticulitis, nonobstructing renal calculi bilaterally.   Assessment & Plan:   Principal Problem:   Small bowel obstruction (HCC) Active Problems:   Chronic obstructive pulmonary disease (HCC)   Chronic anticoagulation (Coumadin)   Chronic bronchitis (HCC)   Depression   SBO (small bowel obstruction) (HCC)   Intractable nausea vomiting secondary to ileus versus early small bowel obstruction -Conservative management per surgical team. -Abdominal x-ray from this morning reviewed.  General surgery team following.  SIRS -Leukocytosis, low-grade fever.  Unknown exact etiology of possible underlying infection.  Will obtain cultures, UA.  If necessary will start antibiotics.  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  Hold Coumadin.  Continue heparin drip  Coronary artery disease without chest pain -Currently chest pain-free.  Toprol-XL 50 mg daily, Imdur 30 mg daily  Bilateral nonobstructive renal stone -Continue Flomax and IV fluids.  Supportive care.  Pulmonary hypertension -Follow-up outpatient  Bilateral lower extremity radiculopathy -On Lyrica  Active tobacco use -Counseled to quit smoking.  As needed nicotine patch.   DVT prophylaxis: Heparin Code Status: Full code Family Communication: None Disposition Plan: Maintain hospital stay until return of bowel function Consultants:   General surgery   Subjective: States he is  able to pass gas but abdomen does not feel any better compared to yesterday.  Still feels distended.  He has been ambulating.  Review of Systems Otherwise negative except as per HPI, including: General = no fevers, chills, dizziness, malaise, fatigue HEENT/EYES = negative for pain, redness, loss of vision, double vision, blurred vision, loss of hearing, sore throat, hoarseness, dysphagia Cardiovascular= negative for chest pain, palpitation, murmurs, lower extremity swelling Respiratory/lungs= negative for shortness of breath, cough, hemoptysis, wheezing, mucus production Gastrointestinal= negative for nausea, vomiting,, abdominal pain, melena, hematemesis Genitourinary= negative for Dysuria, Hematuria, Change in Urinary Frequency MSK = Negative for arthralgia, myalgias, Back Pain, Joint swelling  Neurology= Negative for headache, seizures, numbness, tingling  Psychiatry= Negative for anxiety, depression, suicidal and homocidal ideation Allergy/Immunology= Medication/Food allergy as listed  Skin= Negative for Rash, lesions, ulcers, itching  Objective: Vitals:   09/08/19 2119 09/09/19 0018 09/09/19 0525 09/09/19 0526  BP: 127/78 131/81 137/81   Pulse: 92 82 88 93  Resp: 20 (!) 22 18   Temp: 100.2 F (37.9 C) 98.9 F (37.2 C) 98.9 F (37.2 C)   TempSrc: Oral Oral Oral   SpO2: 94% 90% (!) 86% 95%  Weight:      Height:        Intake/Output Summary (Last 24 hours) at 09/09/2019 1018 Last data filed at 09/09/2019 8299 Gross per 24 hour  Intake 0 ml  Output 300 ml  Net -300 ml   Filed Weights   09/05/19 2017 09/06/19 0500  Weight: 81.6 kg 79.6 kg    Examination: Constitutional: Not in acute distress, NG tube in place Respiratory: Clear to auscultation bilaterally Cardiovascular: Normal sinus rhythm, no rubs Abdomen: Positive bowel sounds her abdomen is still distended. Musculoskeletal: No edema noted Skin: No rashes seen Neurologic: CN 2-12 grossly intact.  And  nonfocal Psychiatric: Normal judgment and insight. Alert and oriented x 3. Normal mood.   Data Reviewed:   CBC: Recent Labs  Lab 09/05/19 1637 09/06/19 0558 09/07/19 0457 09/08/19 0605 09/09/19 0427  WBC 10.7* 9.7 9.4 13.7* 16.0*  HGB 18.6* 16.5 14.7 12.8* 12.9*  HCT 56.0* 50.0 45.3 39.6 39.8  MCV 94.3 94.7 97.0 97.5 97.8  PLT 271 256 226 196 212   Basic Metabolic Panel: Recent Labs  Lab 09/05/19 1637 09/06/19 0558 09/08/19 0605 09/09/19 0427  NA 139 141 138 139  K 4.1 3.8 3.5 3.7  CL 101 104 105 105  CO2 20* 22 21* 23  GLUCOSE 133* 126* 79 72  BUN 13 19 11 8   CREATININE 1.20 1.14 0.87 0.88  CALCIUM 10.3 9.1 8.0* 8.3*  MG  --   --  1.7 2.1   GFR: Estimated Creatinine Clearance: 88.5 mL/min (by C-G formula based on SCr of 0.88 mg/dL). Liver Function Tests: Recent Labs  Lab 09/05/19 1637  AST 23  ALT 22  ALKPHOS 87  BILITOT 1.0  PROT 9.2*  ALBUMIN 4.6   Recent Labs  Lab 09/05/19 1637  LIPASE 25   No results for input(s): AMMONIA in the last 168 hours. Coagulation Profile: Recent Labs  Lab 09/06/19 0756  INR 1.7*   Cardiac Enzymes: No results for input(s): CKTOTAL, CKMB, CKMBINDEX, TROPONINI in the last 168 hours. BNP (last 3 results) No results for input(s): PROBNP in the last 8760 hours. HbA1C: No results for input(s): HGBA1C in the last 72 hours. CBG: Recent Labs  Lab 09/09/19 0008 09/09/19 0024 09/09/19 0047 09/09/19 0618 09/09/19 0637  GLUCAP 67* 60* 149* 65* 95   Lipid Profile: No results for input(s): CHOL, HDL, LDLCALC, TRIG, CHOLHDL, LDLDIRECT in the last 72 hours. Thyroid Function Tests: No results for input(s): TSH, T4TOTAL, FREET4, T3FREE, THYROIDAB in the last 72 hours. Anemia Panel: No results for input(s): VITAMINB12, FOLATE, FERRITIN, TIBC, IRON, RETICCTPCT in the last 72 hours. Sepsis Labs: No results for input(s): PROCALCITON, LATICACIDVEN in the last 168 hours.  Recent Results (from the past 240 hour(s))   Respiratory Panel by RT PCR (Flu A&B, Covid) - Nasopharyngeal Swab     Status: None   Collection Time: 09/05/19  8:13 PM   Specimen: Nasopharyngeal Swab  Result Value Ref Range Status   SARS Coronavirus 2 by RT PCR NEGATIVE NEGATIVE Final    Comment: (NOTE) SARS-CoV-2 target nucleic acids are NOT DETECTED. The SARS-CoV-2 RNA is generally detectable in upper respiratoy specimens during the acute phase of infection. The lowest concentration of SARS-CoV-2 viral copies this assay can detect is 131 copies/mL. A negative result does not preclude SARS-Cov-2 infection and should not be used as the sole basis for treatment or other patient management decisions. A negative result may occur with  improper specimen collection/handling, submission of specimen other than nasopharyngeal swab, presence of viral mutation(s) within the areas targeted by this assay, and inadequate number of viral copies (<131 copies/mL). A negative result must be combined with clinical observations, patient history, and epidemiological information. The expected result is Negative. Fact Sheet for Patients:  https://www.moore.com/https://www.fda.gov/media/142436/download Fact Sheet for Healthcare Providers:  https://www.young.biz/https://www.fda.gov/media/142435/download This test is not yet ap proved or cleared by the [place] FDA and  has been authorized for detection and/or diagnosis of SARS-CoV-2 by FDA under an Emergency Use Authorization (EUA). This EUA will remain  in effect (meaning this test can be used) for the duration of the COVID-19 declaration under Section  564(b)(1) of the Act, 21 U.S.C. section 360bbb-3(b)(1), unless the authorization is terminated or revoked sooner.    Influenza A by PCR NEGATIVE NEGATIVE Final   Influenza B by PCR NEGATIVE NEGATIVE Final    Comment: (NOTE) The Xpert Xpress SARS-CoV-2/FLU/RSV assay is intended as an aid in  the diagnosis of influenza from Nasopharyngeal swab specimens and  should not be used as a sole  basis for treatment. Nasal washings and  aspirates are unacceptable for Xpert Xpress SARS-CoV-2/FLU/RSV  testing. Fact Sheet for Patients: https://www.moore.com/ Fact Sheet for Healthcare Providers: https://www.young.biz/ This test is not yet approved or cleared by the [place] FDA and  has been authorized for detection and/or diagnosis of SARS-CoV-2 by  FDA under an Emergency Use Authorization (EUA). This EUA will remain  in effect (meaning this test can be used) for the duration of the  Covid-19 declaration under Section 564(b)(1) of the Act, 21  U.S.C. section 360bbb-3(b)(1), unless the authorization is  terminated or revoked. Performed at [facility], [address], [place], [place] [address]          Radiology Studies: DG Abd 2 Views  Result Date: 09/08/2019 CLINICAL DATA:  Small bowel obstruction EXAM: ABDOMEN - 2 VIEW COMPARISON:  09/07/2019 FINDINGS: NG tube is in the stomach. Dilated small bowel loops diffusely compatible with small bowel obstruction. This is unchanged since prior study gas is seen within nondistended large bowel. IMPRESSION: Persistent small bowel obstruction pattern, not significantly changed since prior study. Electronically Signed   By: Charlett Nose M.D.   On: 09/08/2019 09:23   DG Abd 2 Views  Result Date: 09/07/2019 CLINICAL DATA:  Small-bowel obstruction EXAM: ABDOMEN - 2 VIEW COMPARISON:  Plain film of the abdomen dated 09/06/2019. FINDINGS: Again noted are dilated gas-filled loops of small bowel throughout the upper abdomen and central abdomen, with associated air-fluid levels. Bowel gas is visualized within the colon. Enteric tube is appropriately positioned in the stomach. No evidence of free intraperitoneal air. Visualized osseous structures are unremarkable. IMPRESSION: 1. Persistent small bowel obstruction, with grossly stable small bowel dilatation compared to recent plain films and CT  abdomen of 09/05/2019. 2. Enteric tube is appropriately positioned in the stomach. Electronically Signed   By: Bary Richard M.D.   On: 09/07/2019 13:10   DG ABD ACUTE 2+V W 1V CHEST  Result Date: 09/09/2019 CLINICAL DATA:  58 year old male with small bowel obstruction versus ileus on CT Abdomen and Pelvis 09/05/2019. EXAM: DG ABDOMEN ACUTE W/ 1V CHEST COMPARISON:  Abdominal series 09/08/2019 and earlier. FINDINGS: Upright and supine views including the chest. Questionable increased patchy opacity in the right lower lung since 09/05/2019, but instead favor overlying chest wall artifact - as the opacity is not present on the 2nd image. Enteric tube remains in place, side hole at the level of the proximal stomach. Mediastinal contours remain normal. No pneumothorax or pneumoperitoneum. Improved bowel gas pattern from yesterday, with decreased small bowel air and persistent large bowel gas to the rectosigmoid colon. There are large bowel air-fluid levels including in the transverse colon. Some small bowel air-fluid levels in the mid abdomen persist., and furthermore there is evidence of some fluid-filled mildly dilated small bowel in the left abdomen (image 3). No acute osseous abnormality identified. IMPRESSION: 1. Improved but not normalized bowel gas pattern from yesterday. There remain some mildly dilated small bowel loops and some small bowel air-fluid levels. 2. Increased air-fluid levels in the large bowel raising the possibility of superimposed diarrhea. 3.  Stable enteric tube. 4.  No acute cardiopulmonary abnormality. Electronically Signed   By: Odessa Fleming M.D.   On: 09/09/2019 09:18        Scheduled Meds: . atomoxetine  80 mg Oral Daily  . DULoxetine  120 mg Oral Daily  . finasteride  5 mg Oral Daily  . fluticasone  2 spray Each Nare Daily  . isosorbide mononitrate  30 mg Oral Daily  . levothyroxine  50 mcg Oral QAC breakfast  . loratadine  10 mg Oral Daily  . metoprolol succinate  50 mg Oral  Daily  . mirtazapine  15 mg Oral Daily  . nicotine  21 mg Transdermal Daily  . pantoprazole  40 mg Oral Daily  . polyethylene glycol  17 g Oral Daily  . potassium citrate  20 mEq Oral BID  . pravastatin  80 mg Oral q1800  . pregabalin  300 mg Oral BID  . tamsulosin  0.4 mg Oral Daily  . traZODone  300 mg Oral QHS  . ziprasidone  60 mg Oral BID WC   Continuous Infusions: . sodium chloride 100 mL/hr at 09/08/19 1926  . heparin 1,900 Units/hr (09/09/19 0923)     LOS: 4 days   Time spent= 35 mins    Jacion Dismore Joline Maxcy, MD [facility]  If 7PM-7AM, please contact night-coverage  09/09/2019, 10:18 AM

## 2019-09-09 NOTE — Consult Note (Signed)
ANTICOAGULATION CONSULT NOTE  Pharmacy Consult for heparin Indication: VTE prophylaxis  Patient Measurements: Height: 5\' 8"  (172.7 cm) Weight: 175 lb 7.8 oz (79.6 kg) IBW/kg (Calculated) : 68.4  Vital Signs: BP: 115/84 (12/25 1231) Pulse Rate: 87 (12/25 1231)  Labs: Recent Labs    09/07/19 0457 09/08/19 0605 09/09/19 0427 09/09/19 1118 09/09/19 1850  HGB 14.7 12.8* 12.9*  --   --   HCT 45.3 39.6 39.8  --   --   PLT 226 196 212  --   --   HEPARINUNFRC 0.44  --  0.46 0.21* 0.42  CREATININE  --  0.87 0.88  --   --     Estimated Creatinine Clearance: 88.5 mL/min (by C-G formula based on SCr of 0.88 mg/dL).   Medical History: Past Medical History:  Diagnosis Date  . Allergy   . Anginal pain ([place])   . Bipolar disorder ([place])   . BPH (benign prostatic hypertrophy)   . CHF (congestive heart failure) ([place])   . COPD (chronic obstructive pulmonary disease) ([place])   . COPD exacerbation ([place]) 11/02/2015  . Coronary artery disease   . Deep vein thrombosis (DVT) ([place])   . DJD (degenerative joint disease)   . Gastric ulcer   . GERD (gastroesophageal reflux disease)   . History of hiatal hernia   . Hypertension   . Hypothyroidism   . Kidney stones   . Lethargy 11/02/2015  . Neuromuscular disorder ([place])   . PTSD (post-traumatic stress disorder)     Medications:  Scheduled:  . atomoxetine  80 mg Oral Daily  . DULoxetine  120 mg Oral Daily  . finasteride  5 mg Oral Daily  . fluticasone  2 spray Each Nare Daily  . isosorbide mononitrate  30 mg Oral Daily  . levothyroxine  50 mcg Oral QAC breakfast  . loratadine  10 mg Oral Daily  . metoprolol succinate  50 mg Oral Daily  . mirtazapine  15 mg Oral Daily  . nicotine  21 mg Transdermal Daily  . pantoprazole  40 mg Oral Daily  . polyethylene glycol  17 g Oral Daily  . potassium citrate  20 mEq Oral BID  . pravastatin  80 mg Oral q1800  . pregabalin  300 mg Oral BID  . sodium chloride flush  10-40 mL Intracatheter Q12H  .  tamsulosin  0.4 mg Oral Daily  . traZODone  300 mg Oral QHS  . ziprasidone  60 mg Oral BID WC    Assessment: 58 y.o. Caucasian male with a known history of CHF, BPH, COPD, GERD and peptic ulcer disease as well as hypertension and hypothyroidism, presented to the emergency room with a SBO. He takes 4 mg warfarin daily for a h/o DVTs and his last dose reported to me was 12/19. He missed several doses due to nausea. H&H trending down, PLT wnl. He is being switched to heparin in case of need for surgical intervention  Heparin Course: 12/22 initiation 1200 units/hr 12/22 1916 HL 0.11: inc to 1500 units/hr 12/23 0457 HL 0.44: no change 12/23 1055 HL 0.31: inc to 1600 units/hr 12/23 2132 HL 0.35: continue current rate 12/24 1950 HL 0.18  12/25 0427 HL 0.46  12/25 1118 HL 0.21  12/25 1850 HL 0.42  Goal of Therapy:  Heparin level 0.3 - 0.7 units/mL Monitor platelets by anticoagulation protocol: Yes   Plan:  Heparin level therapeutic. Will continue infusion at 2000 units/hr. Recheck HL in 6 hours.  CBC daily while on heparin.  Bettey Costa, PharmD, BCPS 09/09/2019,7:20 PM

## 2019-09-09 NOTE — Consult Note (Signed)
ANTICOAGULATION CONSULT NOTE  Pharmacy Consult for heparin Indication: VTE prophylaxis  Patient Measurements: Height: 5\' 8"  (172.7 cm) Weight: 175 lb 7.8 oz (79.6 kg) IBW/kg (Calculated) : 68.4  Vital Signs: Temp: 98.9 F (37.2 C) (12/25 0525) Temp Source: Oral (12/25 0525) BP: 137/81 (12/25 0525) Pulse Rate: 93 (12/25 0526)  Labs: Recent Labs    09/06/19 0558 09/06/19 0756 09/07/19 0457 09/07/19 2132 09/08/19 0605 09/08/19 1950 09/09/19 0427  HGB 16.5  --  14.7  --  12.8*  --  12.9*  HCT 50.0  --  45.3  --  39.6  --  39.8  PLT 256  --  226  --  196  --  212  LABPROT  --  20.2*  --   --   --   --   --   INR  --  1.7*  --   --   --   --   --   HEPARINUNFRC  --   --  0.44 0.35  --  0.18* 0.46  CREATININE 1.14  --   --   --  0.87  --   --     Estimated Creatinine Clearance: 89.5 mL/min (by C-G formula based on SCr of 0.87 mg/dL).   Medical History: Past Medical History:  Diagnosis Date  . Allergy   . Anginal pain ([place])   . Bipolar disorder ([place])   . BPH (benign prostatic hypertrophy)   . CHF (congestive heart failure) ([place])   . COPD (chronic obstructive pulmonary disease) ([place])   . COPD exacerbation ([place]) 11/02/2015  . Coronary artery disease   . Deep vein thrombosis (DVT) ([place])   . DJD (degenerative joint disease)   . Gastric ulcer   . GERD (gastroesophageal reflux disease)   . History of hiatal hernia   . Hypertension   . Hypothyroidism   . Kidney stones   . Lethargy 11/02/2015  . Neuromuscular disorder ([place])   . PTSD (post-traumatic stress disorder)     Medications:  Scheduled:  . atomoxetine  80 mg Oral Daily  . DULoxetine  120 mg Oral Daily  . finasteride  5 mg Oral Daily  . fluticasone  2 spray Each Nare Daily  . isosorbide mononitrate  30 mg Oral Daily  . levothyroxine  50 mcg Oral QAC breakfast  . loratadine  10 mg Oral Daily  . metoprolol succinate  50 mg Oral Daily  . mirtazapine  15 mg Oral Daily  . nicotine  21 mg Transdermal Daily  .  pantoprazole  40 mg Oral Daily  . polyethylene glycol  17 g Oral Daily  . potassium citrate  20 mEq Oral BID  . pravastatin  80 mg Oral q1800  . pregabalin  300 mg Oral BID  . tamsulosin  0.4 mg Oral Daily  . traZODone  300 mg Oral QHS  . ziprasidone  60 mg Oral BID WC    Assessment: 58 y.o. Caucasian male with a known history of CHF, BPH, COPD, GERD and peptic ulcer disease as well as hypertension and hypothyroidism, presented to the emergency room with a SBO. He takes 4 mg warfarin daily for a h/o DVTs and his last dose reported to me was 12/19. He missed several doses due to nausea. H&H trending down, PLT wnl. He is being switched to heparin in case of need for surgical intervention  Heparin Course: 12/22 initiation 1200 units/hr 12/22 1916 HL 0.11: inc to 1500 units/hr 12/23 0457 HL 0.44: no change 12/23 1055 HL  0.31: inc to 1600 units/hr 12/23 2132 HL 0.35: continue current rate  Goal of Therapy:  Heparin level 0.3 - 0.7 units/mL Monitor platelets by anticoagulation protocol: Yes   Plan:   Heparin drip was stopped this am 12/24 in anticipation of surgery. Surgery now on hold but may happen in next day or so depending on pt improvement. Will resume Heparin drip at 1600 units/hr per message with surgeon.  Will check HL in 6 hours.  CBC in am  12/24: HL @ 1950 = 0.18 Will order Heparin 2400 units IV X 1 bolus and increase drip rate to 1900 units/hr.   12/25 @ 0427 HL = 0.46, therapeutic - CBC OK, will continue same rate and recheck in 6 hrs to confirm   Valrie Hart A 09/09/2019,5:33 AM

## 2019-09-09 NOTE — Progress Notes (Signed)
CC: SBO Subjective: 58 year old male with multiple small bowel operations in the past now with small bowel obstruction.  He reports some persistent intermittent abdominal pain that is sharp and moderate intensity.  He also reports passing flatus.  I have personally review his x-ray this morning and have also reviewed his CT scan.  His Gastrografin challenge did not show passage of contrast into the colon however today's x-ray shows some air within the colon and improvement of gas pattern within the small bowel.  Certainly no free air.  However he did have a low-grade temperature and his white count went up. NT 800cc  Objective: Vital signs in last 24 hours: Temp:  [98.9 F (37.2 C)-100.2 F (37.9 C)] 98.9 F (37.2 C) (12/25 0525) Pulse Rate:  [82-95] 93 (12/25 0526) Resp:  [18-22] 18 (12/25 0525) BP: (127-137)/(78-86) 137/81 (12/25 0525) SpO2:  [86 %-95 %] 95 % (12/25 0526) Last BM Date: 09/08/19  Intake/Output from previous day: 12/24 0701 - 12/25 0700 In: 0  Out: 800 [Emesis/NG output:800] Intake/Output this shift: No intake/output data recorded.  Physical exam:  NAD alert Abd: decrease bs, mildly distended. Tender to palpation w/o peritonitis or rebound. Midline laparotomy scar.  Lab Results: CBC  Recent Labs    09/08/19 0605 09/09/19 0427  WBC 13.7* 16.0*  HGB 12.8* 12.9*  HCT 39.6 39.8  PLT 196 212   BMET Recent Labs    09/08/19 0605 09/09/19 0427  NA 138 139  K 3.5 3.7  CL 105 105  CO2 21* 23  GLUCOSE 79 72  BUN 11 8  CREATININE 0.87 0.88  CALCIUM 8.0* 8.3*   PT/INR No results for input(s): LABPROT, INR in the last 72 hours. ABG No results for input(s): PHART, HCO3 in the last 72 hours.  Invalid input(s): PCO2, PO2  Studies/Results: DG Abd 2 Views  Result Date: 09/08/2019 CLINICAL DATA:  Small bowel obstruction EXAM: ABDOMEN - 2 VIEW COMPARISON:  09/07/2019 FINDINGS: NG tube is in the stomach. Dilated small bowel loops diffusely compatible with  small bowel obstruction. This is unchanged since prior study gas is seen within nondistended large bowel. IMPRESSION: Persistent small bowel obstruction pattern, not significantly changed since prior study. Electronically Signed   By: Charlett Nose M.D.   On: 09/08/2019 09:23   DG Abd 2 Views  Result Date: 09/07/2019 CLINICAL DATA:  Small-bowel obstruction EXAM: ABDOMEN - 2 VIEW COMPARISON:  Plain film of the abdomen dated 09/06/2019. FINDINGS: Again noted are dilated gas-filled loops of small bowel throughout the upper abdomen and central abdomen, with associated air-fluid levels. Bowel gas is visualized within the colon. Enteric tube is appropriately positioned in the stomach. No evidence of free intraperitoneal air. Visualized osseous structures are unremarkable. IMPRESSION: 1. Persistent small bowel obstruction, with grossly stable small bowel dilatation compared to recent plain films and CT abdomen of 09/05/2019. 2. Enteric tube is appropriately positioned in the stomach. Electronically Signed   By: Bary Richard M.D.   On: 09/07/2019 13:10   DG ABD ACUTE 2+V W 1V CHEST  Result Date: 09/09/2019 CLINICAL DATA:  58 year old male with small bowel obstruction versus ileus on CT Abdomen and Pelvis 09/05/2019. EXAM: DG ABDOMEN ACUTE W/ 1V CHEST COMPARISON:  Abdominal series 09/08/2019 and earlier. FINDINGS: Upright and supine views including the chest. Questionable increased patchy opacity in the right lower lung since 09/05/2019, but instead favor overlying chest wall artifact - as the opacity is not present on the 2nd image. Enteric tube remains in place, side hole  at the level of the proximal stomach. Mediastinal contours remain normal. No pneumothorax or pneumoperitoneum. Improved bowel gas pattern from yesterday, with decreased small bowel air and persistent large bowel gas to the rectosigmoid colon. There are large bowel air-fluid levels including in the transverse colon. Some small bowel air-fluid  levels in the mid abdomen persist., and furthermore there is evidence of some fluid-filled mildly dilated small bowel in the left abdomen (image 3). No acute osseous abnormality identified. IMPRESSION: 1. Improved but not normalized bowel gas pattern from yesterday. There remain some mildly dilated small bowel loops and some small bowel air-fluid levels. 2. Increased air-fluid levels in the large bowel raising the possibility of superimposed diarrhea. 3. Stable enteric tube. 4.  No acute cardiopulmonary abnormality. Electronically Signed   By: Genevie Ann M.D.   On: 09/09/2019 09:18    Anti-infectives: Anti-infectives (From admission, onward)   None      Assessment/Plan:  Small bowel obstruction.  Patient has been staggering and with no major improvement.  Patient is not peritoneal leak and does not have free air.  I do however think that he will likely require surgical intervention at some point time but he is very hesitant about performing this.  He certainly does not want any surgical intervention today.  Dr. Peyton Najjar will see him tomorrow and explained to him 1 more time.  I am concerned that his white count is rising.  I had an extensive discussion with the patient and he understands the risk of not having surgical intervention to include ischemic bowel and sepsis.  We will continue to follow closely. I spent at least 35 minutes in this encounter with greater than 50% spent in coordination and counseling of his care. I will hold his heparin drip 4 am in case he requires surgery tomorrow.   Caroleen Hamman, MD, [facility]  09/09/2019

## 2019-09-10 ENCOUNTER — Inpatient Hospital Stay: Payer: Medicaid Other

## 2019-09-10 LAB — CBC
HCT: 34.9 % — ABNORMAL LOW (ref 39.0–52.0)
Hemoglobin: 11.9 g/dL — ABNORMAL LOW (ref 13.0–17.0)
MCH: 31.2 pg (ref 26.0–34.0)
MCHC: 34.1 g/dL (ref 30.0–36.0)
MCV: 91.6 fL (ref 80.0–100.0)
Platelets: 214 10*3/uL (ref 150–400)
RBC: 3.81 MIL/uL — ABNORMAL LOW (ref 4.22–5.81)
RDW: 13.5 % (ref 11.5–15.5)
WBC: 14.4 10*3/uL — ABNORMAL HIGH (ref 4.0–10.5)
nRBC: 0 % (ref 0.0–0.2)

## 2019-09-10 LAB — GLUCOSE, CAPILLARY
Glucose-Capillary: 108 mg/dL — ABNORMAL HIGH (ref 70–99)
Glucose-Capillary: 115 mg/dL — ABNORMAL HIGH (ref 70–99)
Glucose-Capillary: 130 mg/dL — ABNORMAL HIGH (ref 70–99)
Glucose-Capillary: 131 mg/dL — ABNORMAL HIGH (ref 70–99)
Glucose-Capillary: 139 mg/dL — ABNORMAL HIGH (ref 70–99)
Glucose-Capillary: 146 mg/dL — ABNORMAL HIGH (ref 70–99)
Glucose-Capillary: 156 mg/dL — ABNORMAL HIGH (ref 70–99)
Glucose-Capillary: 56 mg/dL — ABNORMAL LOW (ref 70–99)
Glucose-Capillary: 63 mg/dL — ABNORMAL LOW (ref 70–99)
Glucose-Capillary: 65 mg/dL — ABNORMAL LOW (ref 70–99)
Glucose-Capillary: 77 mg/dL (ref 70–99)

## 2019-09-10 LAB — BASIC METABOLIC PANEL
Anion gap: 11 (ref 5–15)
BUN: 8 mg/dL (ref 6–20)
CO2: 22 mmol/L (ref 22–32)
Calcium: 8.1 mg/dL — ABNORMAL LOW (ref 8.9–10.3)
Chloride: 106 mmol/L (ref 98–111)
Creatinine, Ser: 0.79 mg/dL (ref 0.61–1.24)
GFR calc Af Amer: >60 mL/min (ref >60)
GFR calc non Af Amer: >60 mL/min (ref >60)
Glucose, Bld: 106 mg/dL — ABNORMAL HIGH (ref 70–99)
Potassium: 3.2 mmol/L — ABNORMAL LOW (ref 3.5–5.1)
Sodium: 139 mmol/L (ref 135–145)

## 2019-09-10 LAB — HEMOGLOBIN A1C
Hgb A1c MFr Bld: 5.9 % — ABNORMAL HIGH (ref 4.8–5.6)
Mean Plasma Glucose: 122.63 mg/dL

## 2019-09-10 LAB — MAGNESIUM: Magnesium: 1.7 mg/dL (ref 1.7–2.4)

## 2019-09-10 LAB — HEPARIN LEVEL (UNFRACTIONATED): Heparin Unfractionated: 0.54 IU/mL (ref 0.30–0.70)

## 2019-09-10 MED ORDER — DEXTROSE-NACL 5-0.9 % IV SOLN: INTRAVENOUS | Status: DC

## 2019-09-10 MED ORDER — DEXTROSE 50 % IV SOLN
25.0000 mL | INTRAVENOUS | Status: AC
  Administered 2019-09-10: 25 mL via INTRAVENOUS

## 2019-09-10 MED ORDER — MAGNESIUM SULFATE 2 GM/50ML IV SOLN
2.0000 g | Freq: Once | INTRAVENOUS | Status: AC
  Administered 2019-09-10: 2 g via INTRAVENOUS
  Filled 2019-09-10: qty 50

## 2019-09-10 MED ORDER — DEXTROSE 50 % IV SOLN
INTRAVENOUS | Status: AC
  Filled 2019-09-10: qty 50

## 2019-09-10 MED ORDER — POTASSIUM CHLORIDE 10 MEQ/100ML IV SOLN
10.0000 meq | INTRAVENOUS | Status: AC
  Administered 2019-09-10 (×6): 10 meq via INTRAVENOUS
  Filled 2019-09-10 (×2): qty 100

## 2019-09-10 MED ORDER — INSULIN ASPART 100 UNIT/ML ~~LOC~~ SOLN
0.0000 [IU] | Freq: Three times a day (TID) | SUBCUTANEOUS | Status: DC
  Administered 2019-09-10 – 2019-09-11 (×2): 1 [IU] via SUBCUTANEOUS
  Filled 2019-09-10 (×2): qty 1

## 2019-09-10 MED ORDER — INSULIN ASPART 100 UNIT/ML ~~LOC~~ SOLN: 0.0000 [IU] | Freq: Every day | SUBCUTANEOUS | Status: DC

## 2019-09-10 MED ORDER — HEPARIN (PORCINE) 25000 UT/250ML-% IV SOLN
2000.0000 [IU]/h | INTRAVENOUS | Status: DC
  Administered 2019-09-10 – 2019-09-12 (×4): 2000 [IU]/h via INTRAVENOUS
  Filled 2019-09-10 (×3): qty 250

## 2019-09-10 NOTE — Progress Notes (Signed)
PROGRESS NOTE    Brett Fernandez  RJJ:[id number] DOB: 04-27-1961 DOA: 09/05/2019 PCP: Dortha Kern, MD   Brief Narrative:  58 year old with history of CHF, BPH, COPD, GERD, peptic ulcer disease, HTN, hypothyroidism came to ER complains of acute onset of diarrhea, nausea and vomiting.  Work-up including CT of the abdomen pelvis showed dilated small bowel loops concerning for ileus versus early small bowel obstruction, possible sigmoid diverticulitis, nonobstructing renal calculi bilaterally.   Assessment & Plan:   Principal Problem:   Small bowel obstruction (HCC) Active Problems:   Chronic obstructive pulmonary disease (HCC)   Chronic anticoagulation (Coumadin)   Chronic bronchitis (HCC)   Depression   SBO (small bowel obstruction) (HCC)   Intractable nausea vomiting secondary to ileus versus early small bowel obstruction -Conservative management, improvement today.  Plans to clamp his NG tube and give trial of clear liquid.  If tolerates will advance it further. -Abdominal x-ray from this morning reviewed.  General surgery team following.  SIRS -Leukocytosis improving, afebrile.  Cultures reviewed and will continue to follow.  Hold off on antibiotics  Essential hypertension -On home metoprolol  Hypothyroidism -Synthroid  History of recurrent DVTs -On chronic anticoagulation with Coumadin.  Hold Coumadin.  Continue heparin drip  Coronary artery disease without chest pain -Currently chest pain-free.  Toprol-XL 50 mg daily, Imdur 30 mg daily  Bilateral nonobstructive renal stone -Continue Flomax and IV fluids.  Supportive care.  Pulmonary hypertension -Follow-up outpatient  Bilateral lower extremity radiculopathy -On Lyrica  Active tobacco use -Counseled to quit smoking.  As needed nicotine patch.   DVT prophylaxis: Heparin Code Status: Full code Family Communication: None Disposition Plan: Maintain hospital stay until he is able to tolerate p.o. safely and  his bowel function is returned to normal  Consultants:   General surgery   Subjective: States his nausea has greatly improved.  He is ambulating okay and passing gas and stool.  His abdomen feels softer.  Review of Systems Otherwise negative except as per HPI, including: General = no fevers, chills, dizziness, malaise, fatigue HEENT/EYES = negative for pain, redness, loss of vision, double vision, blurred vision, loss of hearing, sore throat, hoarseness, dysphagia Cardiovascular= negative for chest pain, palpitation, murmurs, lower extremity swelling Respiratory/lungs= negative for shortness of breath, cough, hemoptysis, wheezing, mucus production Gastrointestinal= negative for nausea, vomiting,, abdominal pain, melena, hematemesis Genitourinary= negative for Dysuria, Hematuria, Change in Urinary Frequency MSK = Negative for arthralgia, myalgias, Back Pain, Joint swelling  Neurology= Negative for headache, seizures, numbness, tingling  Psychiatry= Negative for anxiety, depression, suicidal and homocidal ideation Allergy/Immunology= Medication/Food allergy as listed  Skin= Negative for Rash, lesions, ulcers, itching  Objective: Vitals:   09/09/19 0526 09/09/19 1231 09/09/19 2126 09/10/19 0619  BP:  115/84 117/81 (!) 142/86  Pulse: 93 87 80 84  Resp:   16 16  Temp:  98.1 F (36.7 C) 97.8 F (36.6 C) 98.6 F (37 C)  TempSrc:  Oral Oral Oral  SpO2: 95% 91% 94% 93%  Weight:      Height:        Intake/Output Summary (Last 24 hours) at 09/10/2019 1023 Last data filed at 09/09/2019 1901 Gross per 24 hour  Intake 3898.9 ml  Output 300 ml  Net 3598.9 ml   Filed Weights   09/05/19 2017 09/06/19 0500  Weight: 81.6 kg 79.6 kg    Examination: Constitutional: Not in acute distress, NG tube in place Respiratory: Clear to auscultation bilaterally Cardiovascular: Normal sinus rhythm, no rubs Abdomen: Nontender nondistended  good bowel sounds Musculoskeletal: No edema  noted Skin: No rashes seen Neurologic: CN 2-12 grossly intact.  And nonfocal Psychiatric: Normal judgment and insight. Alert and oriented x 3. Normal mood.    Data Reviewed:   CBC: Recent Labs  Lab 09/06/19 0558 09/07/19 0457 09/08/19 0605 09/09/19 0427 09/10/19 0707  WBC 9.7 9.4 13.7* 16.0* 14.4*  HGB 16.5 14.7 12.8* 12.9* 11.9*  HCT 50.0 45.3 39.6 39.8 34.9*  MCV 94.7 97.0 97.5 97.8 91.6  PLT 256 226 196 212 214   Basic Metabolic Panel: Recent Labs  Lab 09/05/19 1637 09/06/19 0558 09/08/19 0605 09/09/19 0427 09/10/19 0707  NA 139 141 138 139 139  K 4.1 3.8 3.5 3.7 3.2*  CL 101 104 105 105 106  CO2 20* 22 21* 23 22  GLUCOSE 133* 126* 79 72 106*  BUN 13 19 11 8 8   CREATININE 1.20 1.14 0.87 0.88 0.79  CALCIUM 10.3 9.1 8.0* 8.3* 8.1*  MG  --   --  1.7 2.1 1.7   GFR: Estimated Creatinine Clearance: 97.4 mL/min (by C-G formula based on SCr of 0.79 mg/dL). Liver Function Tests: Recent Labs  Lab 09/05/19 1637  AST 23  ALT 22  ALKPHOS 87  BILITOT 1.0  PROT 9.2*  ALBUMIN 4.6   Recent Labs  Lab 09/05/19 1637  LIPASE 25   No results for input(s): AMMONIA in the last 168 hours. Coagulation Profile: Recent Labs  Lab 09/06/19 0756  INR 1.7*   Cardiac Enzymes: No results for input(s): CKTOTAL, CKMB, CKMBINDEX, TROPONINI in the last 168 hours. BNP (last 3 results) No results for input(s): PROBNP in the last 8760 hours. HbA1C: No results for input(s): HGBA1C in the last 72 hours. CBG: Recent Labs  Lab 09/09/19 2359 09/10/19 0018 09/10/19 0603 09/10/19 0627 09/10/19 0653  GLUCAP 56* 115* 65* 63* 108*   Lipid Profile: No results for input(s): CHOL, HDL, LDLCALC, TRIG, CHOLHDL, LDLDIRECT in the last 72 hours. Thyroid Function Tests: No results for input(s): TSH, T4TOTAL, FREET4, T3FREE, THYROIDAB in the last 72 hours. Anemia Panel: No results for input(s): VITAMINB12, FOLATE, FERRITIN, TIBC, IRON, RETICCTPCT in the last 72 hours. Sepsis Labs: No  results for input(s): PROCALCITON, LATICACIDVEN in the last 168 hours.  Recent Results (from the past 240 hour(s))  Respiratory Panel by RT PCR (Flu A&B, Covid) - Nasopharyngeal Swab     Status: None   Collection Time: 09/05/19  8:13 PM   Specimen: Nasopharyngeal Swab  Result Value Ref Range Status   SARS Coronavirus 2 by RT PCR NEGATIVE NEGATIVE Final    Comment: (NOTE) SARS-CoV-2 target nucleic acids are NOT DETECTED. The SARS-CoV-2 RNA is generally detectable in upper respiratoy specimens during the acute phase of infection. The lowest concentration of SARS-CoV-2 viral copies this assay can detect is 131 copies/mL. A negative result does not preclude SARS-Cov-2 infection and should not be used as the sole basis for treatment or other patient management decisions. A negative result may occur with  improper specimen collection/handling, submission of specimen other than nasopharyngeal swab, presence of viral mutation(s) within the areas targeted by this assay, and inadequate number of viral copies (<131 copies/mL). A negative result must be combined with clinical observations, patient history, and epidemiological information. The expected result is Negative. Fact Sheet for Patients:  https://www.moore.com/https://www.fda.gov/media/142436/download Fact Sheet for Healthcare Providers:  https://www.young.biz/https://www.fda.gov/media/142435/download This test is not yet ap proved or cleared by the [place] FDA and  has been authorized for detection and/or diagnosis of SARS-CoV-2 by  FDA under an Emergency Use Authorization (EUA). This EUA will remain  in effect (meaning this test can be used) for the duration of the COVID-19 declaration under Section 564(b)(1) of the Act, 21 U.S.C. section 360bbb-3(b)(1), unless the authorization is terminated or revoked sooner.    Influenza A by PCR NEGATIVE NEGATIVE Final   Influenza B by PCR NEGATIVE NEGATIVE Final    Comment: (NOTE) The Xpert Xpress SARS-CoV-2/FLU/RSV assay is  intended as an aid in  the diagnosis of influenza from Nasopharyngeal swab specimens and  should not be used as a sole basis for treatment. Nasal washings and  aspirates are unacceptable for Xpert Xpress SARS-CoV-2/FLU/RSV  testing. Fact Sheet for Patients: https://www.moore.com/ Fact Sheet for Healthcare Providers: https://www.young.biz/ This test is not yet approved or cleared by the [place] FDA and  has been authorized for detection and/or diagnosis of SARS-CoV-2 by  FDA under an Emergency Use Authorization (EUA). This EUA will remain  in effect (meaning this test can be used) for the duration of the  Covid-19 declaration under Section 564(b)(1) of the Act, 21  U.S.C. section 360bbb-3(b)(1), unless the authorization is  terminated or revoked. Performed at [facility], [address] [place], [place] [address]   Blooc Cultures x 2     Status: None (Preliminary result)   Collection Time: 09/09/19  9:41 AM   Specimen: BLOOD  Result Value Ref Range Status   Specimen Description BLOOD RIGHT ANTECUBITAL  Final   Special Requests   Final    BOTTLES DRAWN AEROBIC AND ANAEROBIC Blood Culture adequate volume   Culture   Final    NO GROWTH < 24 HOURS Performed at [facility], [address] [place], [place] [address]    Report Status PENDING  Incomplete  Blooc Cultures x 2     Status: None (Preliminary result)   Collection Time: 09/09/19 11:18 AM   Specimen: BLOOD  Result Value Ref Range Status   Specimen Description BLOOD BLOOD RIGHT HAND  Final   Special Requests   Final    BOTTLES DRAWN AEROBIC AND ANAEROBIC Blood Culture adequate volume   Culture   Final    NO GROWTH < 24 HOURS Performed at [facility], [address], [place], [place] [address]    Report Status PENDING  Incomplete         Radiology Studies: DG ABD ACUTE 2+V W 1V CHEST  Result Date: 09/09/2019 CLINICAL DATA:  58 year old  male with small bowel obstruction versus ileus on CT Abdomen and Pelvis 09/05/2019. EXAM: DG ABDOMEN ACUTE W/ 1V CHEST COMPARISON:  Abdominal series 09/08/2019 and earlier. FINDINGS: Upright and supine views including the chest. Questionable increased patchy opacity in the right lower lung since 09/05/2019, but instead favor overlying chest wall artifact - as the opacity is not present on the 2nd image. Enteric tube remains in place, side hole at the level of the proximal stomach. Mediastinal contours remain normal. No pneumothorax or pneumoperitoneum. Improved bowel gas pattern from yesterday, with decreased small bowel air and persistent large bowel gas to the rectosigmoid colon. There are large bowel air-fluid levels including in the transverse colon. Some small bowel air-fluid levels in the mid abdomen persist., and furthermore there is evidence of some fluid-filled mildly dilated small bowel in the left abdomen (image 3). No acute osseous abnormality identified. IMPRESSION: 1. Improved but not normalized bowel gas pattern from yesterday. There remain some mildly dilated small bowel loops and some small bowel air-fluid levels. 2.  Increased air-fluid levels in the large bowel raising the possibility of superimposed diarrhea. 3. Stable enteric tube. 4.  No acute cardiopulmonary abnormality. Electronically Signed   By: Genevie Ann M.D.   On: 09/09/2019 09:18   DG Abd Portable 2V  Result Date: 09/10/2019 CLINICAL DATA:  58 year old male with small bowel obstruction versus ileus on CT 09/05/2019. Subsequent encounter. EXAM: PORTABLE ABDOMEN - 2 VIEW COMPARISON:  09/09/2019 and earlier. FINDINGS: Portable upright and supine views of the abdomen and pelvis at 0548 hours. No pneumoperitoneum. Stable enteric tube, side hole in the proximal stomach. Further improved bowel gas pattern since yesterday. Fewer dilated small bowel in the left mid abdomen, now primarily 1 4 centimeter diameter gas-filled loop. Increased non  distended distal small bowel gas. Less dilated large bowel. There is a staple line redemonstrated in the left abdomen. Other abdominal and pelvic visceral contours appear normal. No acute osseous abnormality identified. IMPRESSION: 1. Stable enteric tube.  No free air. 2. Further improved bowel gas pattern since yesterday. One dilated 4 cm gas-filled left mid abdominal small bowel loop persists. Electronically Signed   By: Genevie Ann M.D.   On: 09/10/2019 08:13        Scheduled Meds: . atomoxetine  80 mg Oral Daily  . dextrose      . dextrose      . DULoxetine  120 mg Oral Daily  . finasteride  5 mg Oral Daily  . fluticasone  2 spray Each Nare Daily  . isosorbide mononitrate  30 mg Oral Daily  . levothyroxine  50 mcg Oral QAC breakfast  . loratadine  10 mg Oral Daily  . metoprolol succinate  50 mg Oral Daily  . mirtazapine  15 mg Oral Daily  . nicotine  21 mg Transdermal Daily  . pantoprazole  40 mg Oral Daily  . polyethylene glycol  17 g Oral Daily  . pravastatin  80 mg Oral q1800  . pregabalin  300 mg Oral BID  . sodium chloride flush  10-40 mL Intracatheter Q12H  . tamsulosin  0.4 mg Oral Daily  . traZODone  300 mg Oral QHS  . ziprasidone  60 mg Oral BID WC   Continuous Infusions: . dextrose 5 % and 0.9% NaCl 100 mL/hr at 09/10/19 0652  . potassium chloride 10 mEq (09/10/19 0933)     LOS: 5 days   Time spent= 35 mins    Lacresha Fusilier Arsenio Loader, MD [facility]  If 7PM-7AM, please contact night-coverage  09/10/2019, 10:23 AM

## 2019-09-10 NOTE — Consult Note (Signed)
ANTICOAGULATION CONSULT NOTE  Pharmacy Consult for heparin Indication: VTE prophylaxis  Patient Measurements: Height: 5\' 8"  (172.7 cm) Weight: 175 lb 7.8 oz (79.6 kg) IBW/kg (Calculated) : 68.4  Vital Signs: Temp: 97.8 F (36.6 C) (12/25 2126) Temp Source: Oral (12/25 2126) BP: 117/81 (12/25 2126) Pulse Rate: 80 (12/25 2126)  Labs: Recent Labs    09/07/19 0457 09/08/19 0605 09/09/19 0427 09/09/19 1118 09/09/19 1850 09/10/19 0114  HGB 14.7 12.8* 12.9*  --   --   --   HCT 45.3 39.6 39.8  --   --   --   PLT 226 196 212  --   --   --   HEPARINUNFRC 0.44  --  0.46 0.21* 0.42 0.54  CREATININE  --  0.87 0.88  --   --   --     Estimated Creatinine Clearance: 88.5 mL/min (by C-G formula based on SCr of 0.88 mg/dL).   Medical History: Past Medical History:  Diagnosis Date  . Allergy   . Anginal pain (HCC)   . Bipolar disorder (HCC)   . BPH (benign prostatic hypertrophy)   . CHF (congestive heart failure) (HCC)   . COPD (chronic obstructive pulmonary disease) (HCC)   . COPD exacerbation (HCC) 11/02/2015  . Coronary artery disease   . Deep vein thrombosis (DVT) (HCC)   . DJD (degenerative joint disease)   . Gastric ulcer   . GERD (gastroesophageal reflux disease)   . History of hiatal hernia   . Hypertension   . Hypothyroidism   . Kidney stones   . Lethargy 11/02/2015  . Neuromuscular disorder (HCC)   . PTSD (post-traumatic stress disorder)     Medications:  Scheduled:  . dextrose      . atomoxetine  80 mg Oral Daily  . DULoxetine  120 mg Oral Daily  . finasteride  5 mg Oral Daily  . fluticasone  2 spray Each Nare Daily  . isosorbide mononitrate  30 mg Oral Daily  . levothyroxine  50 mcg Oral QAC breakfast  . loratadine  10 mg Oral Daily  . metoprolol succinate  50 mg Oral Daily  . mirtazapine  15 mg Oral Daily  . nicotine  21 mg Transdermal Daily  . pantoprazole  40 mg Oral Daily  . polyethylene glycol  17 g Oral Daily  . potassium citrate  20 mEq Oral  BID  . pravastatin  80 mg Oral q1800  . pregabalin  300 mg Oral BID  . sodium chloride flush  10-40 mL Intracatheter Q12H  . tamsulosin  0.4 mg Oral Daily  . traZODone  300 mg Oral QHS  . ziprasidone  60 mg Oral BID WC    Assessment: 58 y.o. Caucasian male with a known history of CHF, BPH, COPD, GERD and peptic ulcer disease as well as hypertension and hypothyroidism, presented to the emergency room with a SBO. He takes 4 mg warfarin daily for a h/o DVTs and his last dose reported to me was 12/19. He missed several doses due to nausea. H&H trending down, PLT wnl. He is being switched to heparin in case of need for surgical intervention  Heparin Course: 12/22 initiation 1200 units/hr 12/22 1916 HL 0.11: inc to 1500 units/hr 12/23 0457 HL 0.44: no change 12/23 1055 HL 0.31: inc to 1600 units/hr 12/23 2132 HL 0.35: continue current rate 12/24 1950 HL 0.18  12/25 0427 HL 0.46  12/25 1118 HL 0.21  12/25 1850 HL 0.42 12/26 0114 HL 054  Goal of  Therapy:  Heparin level 0.3 - 0.7 units/mL Monitor platelets by anticoagulation protocol: Yes   Plan:  Heparin level therapeutic. Will continue infusion at 2000 units/hr. Plan is for heparin to be stopped at 0400.  CBC daily while on heparin.    Ena Dawley, PharmD 09/10/2019,1:41 AM

## 2019-09-10 NOTE — Progress Notes (Signed)
[place] Hospital Day(s): 5.   Post op day(s):  Marland Kitchen   Interval History: Patient seen and examined, no acute events or new complaints overnight. Patient reports feeling better today he denies any abdominal pain.  He denies any abdominal soreness.  There is no pain radiation.  There is no alleviating or aggravating factor.  Patient reported that he has been passing a lot of gas per rectum that has been evidenced by nurse.  He is also passing multiple bowel movements since yesterday, also seen by nurse.  Denies nausea or vomiting.  Denies fever chills   Vital signs in last 24 hours: [min-max] current  Temp:  [97.8 F (36.6 C)-98.6 F (37 C)] 98.6 F (37 C) (12/26 0619) Pulse Rate:  [80-87] 84 (12/26 0619) Resp:  [16] 16 (12/26 0619) BP: (115-142)/(81-86) 142/86 (12/26 0619) SpO2:  [91 %-94 %] 93 % (12/26 0619)     Height: 5\' 8"  (172.7 cm) Weight: 79.6 kg BMI (Calculated): 26.69    Physical Exam:  Constitutional: alert, cooperative and no distress  Respiratory: breathing non-labored at rest  Cardiovascular: regular rate and sinus rhythm  Gastrointestinal: soft, non-tender, and non-distended  Labs:  CBC Latest Ref Rng & Units 09/10/2019 09/09/2019 09/08/2019  WBC 4.0 - 10.5 K/uL 14.4(H) 16.0(H) 13.7(H)  Hemoglobin 13.0 - 17.0 g/dL 11.9(L) 12.9(L) 12.8(L)  Hematocrit 39.0 - 52.0 % 34.9(L) 39.8 39.6  Platelets 150 - 400 K/uL 214 212 196   CMP Latest Ref Rng & Units 09/10/2019 09/09/2019 09/08/2019  Glucose 70 - 99 mg/dL 106(H) 72 79  BUN 6 - 20 mg/dL 8 8 11   Creatinine 0.61 - 1.24 mg/dL 0.79 0.88 0.87  Sodium 135 - 145 mmol/L 139 139 138  Potassium 3.5 - 5.1 mmol/L 3.2(L) 3.7 3.5  Chloride 98 - 111 mmol/L 106 105 105  CO2 22 - 32 mmol/L 22 23 21(L)  Calcium 8.9 - 10.3 mg/dL 8.1(L) 8.3(L) 8.0(L)  Total Protein 6.5 - 8.1 g/dL - - -  Total Bilirubin 0.3 - 1.2 mg/dL - - -  Alkaline Phos 38 - 126 U/L - - -  AST 15 - 41 U/L - - -  ALT 0 - 44 U/L - - -    Imaging  studies: Abdominal x-ray today shows no small bowel dilation, sign of resolving small bowel obstruction.  There is adequate gas in the large intestine.  I personally evaluated the images.   Assessment/Plan:  58 y.o.malewith small bowel obstruction, complicated by pertinent comorbidities includingCHF, COPD, hypertension, coronary artery disease, pulmonary hypertension.  Patient with resolving small bowel obstruction.  Physical exam today with abdomen very soft and nondistended.  There has been no fever.  White blood cell count decreasing.  Patient passing gas and having bowel movement.  We will start clear liquid diet trial and advance as tolerated.  We will continue with heparin drip.  Encourage the patient to ambulate.  Appreciate hospitalist management of medical conditions.  Arnold Long, MD

## 2019-09-10 NOTE — Progress Notes (Signed)
Hypoglycemia event overnight reported. IV fluids changed from NS to D5NS at 136ml/h

## 2019-09-10 NOTE — Consult Note (Signed)
ANTICOAGULATION CONSULT NOTE  Pharmacy Consult for heparin Indication: VTE prophylaxis  Patient Measurements: Height: 5\' 8"  (172.7 cm) Weight: 175 lb 7.8 oz (79.6 kg) IBW/kg (Calculated) : 68.4  Vital Signs: Temp: 98.6 F (37 C) (12/26 0619) Temp Source: Oral (12/26 0619) BP: 142/86 (12/26 0619) Pulse Rate: 84 (12/26 0619)  Labs: Recent Labs    09/08/19 0605 09/09/19 0427 09/09/19 1118 09/09/19 1850 09/10/19 0114 09/10/19 0707  HGB 12.8* 12.9*  --   --   --  11.9*  HCT 39.6 39.8  --   --   --  34.9*  PLT 196 212  --   --   --  214  HEPARINUNFRC  --  0.46 0.21* 0.42 0.54  --   CREATININE 0.87 0.88  --   --   --  0.79    Estimated Creatinine Clearance: 97.4 mL/min (by C-G formula based on SCr of 0.79 mg/dL).   Medical History: Past Medical History:  Diagnosis Date  . Allergy   . Anginal pain (HCC)   . Bipolar disorder (HCC)   . BPH (benign prostatic hypertrophy)   . CHF (congestive heart failure) (HCC)   . COPD (chronic obstructive pulmonary disease) (HCC)   . COPD exacerbation (HCC) 11/02/2015  . Coronary artery disease   . Deep vein thrombosis (DVT) (HCC)   . DJD (degenerative joint disease)   . Gastric ulcer   . GERD (gastroesophageal reflux disease)   . History of hiatal hernia   . Hypertension   . Hypothyroidism   . Kidney stones   . Lethargy 11/02/2015  . Neuromuscular disorder (HCC)   . PTSD (post-traumatic stress disorder)     Medications:  Scheduled:  . atomoxetine  80 mg Oral Daily  . dextrose      . dextrose      . DULoxetine  120 mg Oral Daily  . finasteride  5 mg Oral Daily  . fluticasone  2 spray Each Nare Daily  . insulin aspart  0-5 Units Subcutaneous QHS  . insulin aspart  0-9 Units Subcutaneous TID WC  . isosorbide mononitrate  30 mg Oral Daily  . levothyroxine  50 mcg Oral QAC breakfast  . loratadine  10 mg Oral Daily  . metoprolol succinate  50 mg Oral Daily  . mirtazapine  15 mg Oral Daily  . nicotine  21 mg Transdermal Daily   . pantoprazole  40 mg Oral Daily  . polyethylene glycol  17 g Oral Daily  . pravastatin  80 mg Oral q1800  . pregabalin  300 mg Oral BID  . sodium chloride flush  10-40 mL Intracatheter Q12H  . tamsulosin  0.4 mg Oral Daily  . traZODone  300 mg Oral QHS  . ziprasidone  60 mg Oral BID WC    Assessment: 58 y.o. Caucasian male with a known history of CHF, BPH, COPD, GERD and peptic ulcer disease as well as hypertension and hypothyroidism, presented to the emergency room with a SBO. He takes 4 mg warfarin daily for a h/o DVTs and his last dose reported to me was 12/19. He missed several doses due to nausea. H&H trending down, PLT wnl. He is being switched to heparin in case of need for surgical intervention  Heparin Course: 12/22 initiation 1200 units/hr 12/22 1916 HL 0.11: inc to 1500 units/hr 12/23 0457 HL 0.44: no change 12/23 1055 HL 0.31: inc to 1600 units/hr 12/23 2132 HL 0.35: continue current rate 12/24 1950 HL 0.18  12/25 0427 HL 0.46  12/25 1118 HL 0.21  12/25 1850 HL 0.42 12/26 0114 HL 0.54  Will continue infusion at 2000 units/hr. Plan is for heparin to be stopped at 0400.   Goal of Therapy:  Heparin level 0.3 - 0.7 units/mL Monitor platelets by anticoagulation protocol: Yes   Plan:  12/26 Heparin drip was actually not stopped even though order was discontinued at 0400 this am, per discussion with RN. Surgeon wants to continue Heparin drip for now as pt improved and may not need surgery. Next HL with am labs  CBC daily while on heparin.    Kewana Sanon A, PharmD 09/10/2019,10:53 AM

## 2019-09-11 DIAGNOSIS — E785 Hyperlipidemia, unspecified: Secondary | ICD-10-CM

## 2019-09-11 DIAGNOSIS — R7301 Impaired fasting glucose: Secondary | ICD-10-CM

## 2019-09-11 DIAGNOSIS — J449 Chronic obstructive pulmonary disease, unspecified: Secondary | ICD-10-CM

## 2019-09-11 DIAGNOSIS — I82409 Acute embolism and thrombosis of unspecified deep veins of unspecified lower extremity: Secondary | ICD-10-CM

## 2019-09-11 LAB — BASIC METABOLIC PANEL
Anion gap: 8 (ref 5–15)
BUN: <5 mg/dL — ABNORMAL LOW (ref 6–20)
CO2: 24 mmol/L (ref 22–32)
Calcium: 8.5 mg/dL — ABNORMAL LOW (ref 8.9–10.3)
Chloride: 109 mmol/L (ref 98–111)
Creatinine, Ser: 0.66 mg/dL (ref 0.61–1.24)
GFR calc Af Amer: >60 mL/min (ref >60)
GFR calc non Af Amer: >60 mL/min (ref >60)
Glucose, Bld: 122 mg/dL — ABNORMAL HIGH (ref 70–99)
Potassium: 3.6 mmol/L (ref 3.5–5.1)
Sodium: 141 mmol/L (ref 135–145)

## 2019-09-11 LAB — GLUCOSE, CAPILLARY
Glucose-Capillary: 115 mg/dL — ABNORMAL HIGH (ref 70–99)
Glucose-Capillary: 106 mg/dL — ABNORMAL HIGH (ref 70–99)
Glucose-Capillary: 140 mg/dL — ABNORMAL HIGH (ref 70–99)
Glucose-Capillary: 123 mg/dL — ABNORMAL HIGH (ref 70–99)
Glucose-Capillary: 107 mg/dL — ABNORMAL HIGH (ref 70–99)

## 2019-09-11 LAB — CBC
HCT: 37.7 % — ABNORMAL LOW (ref 39.0–52.0)
Hemoglobin: 12.3 g/dL — ABNORMAL LOW (ref 13.0–17.0)
MCH: 31.1 pg (ref 26.0–34.0)
MCHC: 32.6 g/dL (ref 30.0–36.0)
MCV: 95.4 fL (ref 80.0–100.0)
Platelets: 248 10*3/uL (ref 150–400)
RBC: 3.95 MIL/uL — ABNORMAL LOW (ref 4.22–5.81)
RDW: 13.5 % (ref 11.5–15.5)
WBC: 13.1 10*3/uL — ABNORMAL HIGH (ref 4.0–10.5)
nRBC: 0 % (ref 0.0–0.2)

## 2019-09-11 LAB — PROTIME-INR
INR: 1.2 (ref 0.8–1.2)
Prothrombin Time: 15.4 seconds — ABNORMAL HIGH (ref 11.4–15.2)

## 2019-09-11 LAB — MAGNESIUM: Magnesium: 1.9 mg/dL (ref 1.7–2.4)

## 2019-09-11 LAB — HEPARIN LEVEL (UNFRACTIONATED): Heparin Unfractionated: 0.47 IU/mL (ref 0.30–0.70)

## 2019-09-11 MED ORDER — WARFARIN SODIUM 4 MG PO TABS
4.0000 mg | ORAL_TABLET | Freq: Once | ORAL | Status: AC
  Administered 2019-09-11: 4 mg via ORAL
  Filled 2019-09-11: qty 1

## 2019-09-11 MED ORDER — BUDESONIDE 0.5 MG/2ML IN SUSP
0.5000 mg | Freq: Two times a day (BID) | RESPIRATORY_TRACT | Status: DC
  Administered 2019-09-11 – 2019-09-12 (×3): 0.5 mg via RESPIRATORY_TRACT
  Filled 2019-09-11 (×3): qty 2

## 2019-09-11 MED ORDER — POLYETHYLENE GLYCOL 3350 17 G PO PACK: 17.0000 g | PACK | Freq: Every day | ORAL | Status: DC | PRN

## 2019-09-11 MED ORDER — WARFARIN - PHARMACIST DOSING INPATIENT: Freq: Every day | Status: DC

## 2019-09-11 MED ORDER — IPRATROPIUM-ALBUTEROL 0.5-2.5 (3) MG/3ML IN SOLN
3.0000 mL | Freq: Four times a day (QID) | RESPIRATORY_TRACT | Status: DC
  Administered 2019-09-11 – 2019-09-12 (×3): 3 mL via RESPIRATORY_TRACT
  Filled 2019-09-11 (×3): qty 3

## 2019-09-11 NOTE — Progress Notes (Signed)
[facility] Day(s): 6.   Post op day(s):  Marland Kitchen   Interval History: Patient seen and examined, no acute events or new complaints overnight. Patient reports feeling well today.  He reported that he has multiple gas and bowel movement.  He denied nausea or vomiting.  He reported tolerated clear liquid diet.  There is no pain radiation.  There is no alleviating or aggravating factor.  Vital signs in last 24 hours: [min-max] current  Temp:  [98.3 F (36.8 C)-99.5 F (37.5 C)] 99.5 F (37.5 C) (12/27 0600) Pulse Rate:  [79-84] 79 (12/27 0600) Resp:  [16-18] 18 (12/27 0600) BP: (139-140)/(85-91) 140/87 (12/27 0600) SpO2:  [93 %-97 %] 93 % (12/27 0600)     Height: 5\' 8"  (172.7 cm) Weight: 79.6 kg BMI (Calculated): 26.69   Physical Exam:  Constitutional: alert, cooperative and no distress  Respiratory: breathing non-labored at rest  Cardiovascular: regular rate and sinus rhythm  Gastrointestinal: soft, non-tender, and non-distended  Labs:  CBC Latest Ref Rng & Units 09/11/2019 09/10/2019 09/09/2019  WBC 4.0 - 10.5 K/uL 13.1(H) 14.4(H) 16.0(H)  Hemoglobin 13.0 - 17.0 g/dL 12.3(L) 11.9(L) 12.9(L)  Hematocrit 39.0 - 52.0 % 37.7(L) 34.9(L) 39.8  Platelets 150 - 400 K/uL 248 214 212   CMP Latest Ref Rng & Units 09/11/2019 09/10/2019 09/09/2019  Glucose 70 - 99 mg/dL 122(H) 106(H) 72  BUN 6 - 20 mg/dL <5(L) 8 8  Creatinine 0.61 - 1.24 mg/dL 0.66 0.79 0.88  Sodium 135 - 145 mmol/L 141 139 139  Potassium 3.5 - 5.1 mmol/L 3.6 3.2(L) 3.7  Chloride 98 - 111 mmol/L 109 106 105  CO2 22 - 32 mmol/L 24 22 23   Calcium 8.9 - 10.3 mg/dL 8.5(L) 8.1(L) 8.3(L)  Total Protein 6.5 - 8.1 g/dL - - -  Total Bilirubin 0.3 - 1.2 mg/dL - - -  Alkaline Phos 38 - 126 U/L - - -  AST 15 - 41 U/L - - -  ALT 0 - 44 U/L - - -    Imaging studies: No new pertinent imaging studies   Assessment/Plan:  58 y.o.malewith small bowel obstruction, complicated by pertinent comorbidities  includingCHF, COPD, hypertension, coronary artery disease, pulmonary hypertension. Patient with slowly resolving small bowel obstruction.  Yesterday he tolerated clear liquid diet and continue passing gas and having bowel movement.  I will advance his diet to full liquids.  He has no abdominal pain.  He has no nausea or vomiting.  He is ambulating.  Appreciate hospitalist management of medical condition.  If he tolerates full liquids may advance to soft diet.  Arnold Long, MD

## 2019-09-11 NOTE — Plan of Care (Signed)
Patient eating adequate without nausea or vomiting.  Patient denies pain.  Ambulated in hallway x 2, tolerated well.  Patient verbalized no needs or questions at this time.

## 2019-09-11 NOTE — Consult Note (Signed)
ANTICOAGULATION CONSULT NOTE  Pharmacy Consult for heparin and warfarin Indication: VTE prophylaxis  Patient Measurements: Height: 5\' 8"  (172.7 cm) Weight: 175 lb 7.8 oz (79.6 kg) IBW/kg (Calculated) : 68.4  Vital Signs: Temp: 99.5 F (37.5 C) (12/27 0600) Temp Source: Oral (12/27 0600) BP: 140/87 (12/27 0600) Pulse Rate: 79 (12/27 0600)  Labs: Recent Labs    09/09/19 0427 09/09/19 1850 09/10/19 0114 09/10/19 0707 09/11/19 0416  HGB 12.9*  --   --  11.9* 12.3*  HCT 39.8  --   --  34.9* 37.7*  PLT 212  --   --  214 248  LABPROT  --   --   --   --  15.4*  INR  --   --   --   --  1.2  HEPARINUNFRC 0.46 0.42 0.54  --  0.47  CREATININE 0.88  --   --  0.79 0.66    Estimated Creatinine Clearance: 97.4 mL/min (by C-G formula based on SCr of 0.66 mg/dL).  Medical History: Past Medical History:  Diagnosis Date  . Allergy   . Anginal pain ([place])   . Bipolar disorder ([place])   . BPH (benign prostatic hypertrophy)   . CHF (congestive heart failure) ([place])   . COPD (chronic obstructive pulmonary disease) ([place])   . COPD exacerbation ([place]) 11/02/2015  . Coronary artery disease   . Deep vein thrombosis (DVT) ([place])   . DJD (degenerative joint disease)   . Gastric ulcer   . GERD (gastroesophageal reflux disease)   . History of hiatal hernia   . Hypertension   . Hypothyroidism   . Kidney stones   . Lethargy 11/02/2015  . Neuromuscular disorder ([place])   . PTSD (post-traumatic stress disorder)     Medications:  Scheduled:  . atomoxetine  80 mg Oral Daily  . budesonide (PULMICORT) nebulizer solution  0.5 mg Nebulization BID  . DULoxetine  120 mg Oral Daily  . finasteride  5 mg Oral Daily  . fluticasone  2 spray Each Nare Daily  . insulin aspart  0-5 Units Subcutaneous QHS  . insulin aspart  0-9 Units Subcutaneous TID WC  . ipratropium-albuterol  3 mL Nebulization Q6H  . isosorbide mononitrate  30 mg Oral Daily  . levothyroxine  50 mcg Oral QAC breakfast  . loratadine  10 mg  Oral Daily  . metoprolol succinate  50 mg Oral Daily  . mirtazapine  15 mg Oral Daily  . nicotine  21 mg Transdermal Daily  . pantoprazole  40 mg Oral Daily  . pravastatin  80 mg Oral q1800  . pregabalin  300 mg Oral BID  . sodium chloride flush  10-40 mL Intracatheter Q12H  . tamsulosin  0.4 mg Oral Daily  . traZODone  300 mg Oral QHS  . ziprasidone  60 mg Oral BID WC    Assessment: 58 y.o. Caucasian male with a known history of CHF, BPH, COPD, GERD and peptic ulcer disease as well as hypertension and hypothyroidism, presented to the emergency room with a SBO. He takes 4 mg warfarin daily for a h/o DVTs and his last dose reported to me was 12/19. He missed several doses due to nausea. H&H trending down, PLT wnl. He is being switched to heparin in case of need for surgical intervention  Heparin Course: 12/25 1850 HL 0.42 12/26 0114 HL 0.54   12/27 0416 HL 0.47  Date INR Warfarin Dose  12/27 1.2 4 mg        Goal  of Therapy:  Heparin level 0.3 - 0.7 units/mL Monitor platelets by anticoagulation protocol: Yes   Plan:  12/26 Heparin drip was actually not stopped even though order was discontinued at 0400 this am, per discussion with RN. Surgeon wants to continue Heparin drip for now as pt improved and may not need surgery. Next HL with am labs   Heparin level therapeutic.  CBC stable.  Continue current rate, recheck HL and CBC in am  Warfarin: INR is subtherapeutic. Will restart warfarin with heparin bridge. Will give warfarin 4 mg x 1 tonight (home dose). Recommend continuing heparin until INR is > 2 and may discontinue heparin. Daily INR ordered. CBC stable.    Ronnald Ramp, PharmD, BCPS 09/11/2019,9:25 AM

## 2019-09-11 NOTE — Consult Note (Signed)
ANTICOAGULATION CONSULT NOTE  Pharmacy Consult for heparin Indication: VTE prophylaxis  Patient Measurements: Height: 5\' 8"  (172.7 cm) Weight: 175 lb 7.8 oz (79.6 kg) IBW/kg (Calculated) : 68.4  Vital Signs: Temp: 99.5 F (37.5 C) (12/27 0600) Temp Source: Oral (12/27 0600) BP: 140/87 (12/27 0600) Pulse Rate: 79 (12/27 0600)  Labs: Recent Labs    09/09/19 0427 09/09/19 1850 09/10/19 0114 09/10/19 0707 09/11/19 0416  HGB 12.9*  --   --  11.9* 12.3*  HCT 39.8  --   --  34.9* 37.7*  PLT 212  --   --  214 248  HEPARINUNFRC 0.46 0.42 0.54  --  0.47  CREATININE 0.88  --   --  0.79 0.66    Estimated Creatinine Clearance: 97.4 mL/min (by C-G formula based on SCr of 0.66 mg/dL).  Medical History: Past Medical History:  Diagnosis Date  . Allergy   . Anginal pain ([place])   . Bipolar disorder ([place])   . BPH (benign prostatic hypertrophy)   . CHF (congestive heart failure) ([place])   . COPD (chronic obstructive pulmonary disease) ([place])   . COPD exacerbation ([place]) 11/02/2015  . Coronary artery disease   . Deep vein thrombosis (DVT) ([place])   . DJD (degenerative joint disease)   . Gastric ulcer   . GERD (gastroesophageal reflux disease)   . History of hiatal hernia   . Hypertension   . Hypothyroidism   . Kidney stones   . Lethargy 11/02/2015  . Neuromuscular disorder ([place])   . PTSD (post-traumatic stress disorder)     Medications:  Scheduled:  . atomoxetine  80 mg Oral Daily  . DULoxetine  120 mg Oral Daily  . finasteride  5 mg Oral Daily  . fluticasone  2 spray Each Nare Daily  . insulin aspart  0-5 Units Subcutaneous QHS  . insulin aspart  0-9 Units Subcutaneous TID WC  . isosorbide mononitrate  30 mg Oral Daily  . levothyroxine  50 mcg Oral QAC breakfast  . loratadine  10 mg Oral Daily  . metoprolol succinate  50 mg Oral Daily  . mirtazapine  15 mg Oral Daily  . nicotine  21 mg Transdermal Daily  . pantoprazole  40 mg Oral Daily  . polyethylene glycol  17 g Oral  Daily  . pravastatin  80 mg Oral q1800  . pregabalin  300 mg Oral BID  . sodium chloride flush  10-40 mL Intracatheter Q12H  . tamsulosin  0.4 mg Oral Daily  . traZODone  300 mg Oral QHS  . ziprasidone  60 mg Oral BID WC    Assessment: 58 y.o. Caucasian male with a known history of CHF, BPH, COPD, GERD and peptic ulcer disease as well as hypertension and hypothyroidism, presented to the emergency room with a SBO. He takes 4 mg warfarin daily for a h/o DVTs and his last dose reported to me was 12/19. He missed several doses due to nausea. H&H trending down, PLT wnl. He is being switched to heparin in case of need for surgical intervention  Heparin Course: 12/22 initiation 1200 units/hr 12/22 1916 HL 0.11: inc to 1500 units/hr 12/23 0457 HL 0.44: no change 12/23 1055 HL 0.31: inc to 1600 units/hr 12/23 2132 HL 0.35: continue current rate 12/24 1950 HL 0.18  12/25 0427 HL 0.46  12/25 1118 HL 0.21  12/25 1850 HL 0.42 12/26 0114 HL 0.54  Will continue infusion at 2000 units/hr. Plan is for heparin to be stopped at 0400.  Goal of Therapy:  Heparin level 0.3 - 0.7 units/mL Monitor platelets by anticoagulation protocol: Yes   Plan:  12/26 Heparin drip was actually not stopped even though order was discontinued at 0400 this am, per discussion with RN. Surgeon wants to continue Heparin drip for now as pt improved and may not need surgery. Next HL with am labs   12/27 @ 0416 HL = 0.47, therapeutic.  CBC stable.  Continue current rate, recheck HL and CBC in am   Wayland Denis, PharmD 09/11/2019,6:38 AM

## 2019-09-11 NOTE — Progress Notes (Signed)
Patient ID: Brett Fernandez, male   DOB: [birth year], 58 y.o.   MRN: [id number] [facility] PROGRESS NOTE  ORLO BRICKLE HBZ:[id number] DOB: 18-Sep-1960 DOA: 09/05/2019 PCP: Dortha Kern, MD  HPI/Subjective: Patient having some tenderness in his left lower quadrant.  Had some diarrhea and passing gas.  Tolerating liquid diet.  Objective: Vitals:   09/11/19 1035 09/11/19 1150  BP: 121/83 121/84  Pulse: 85 83  Resp:    Temp:  98.2 F (36.8 C)  SpO2:  95%    Intake/Output Summary (Last 24 hours) at 09/11/2019 1515 Last data filed at 09/11/2019 0800 Gross per 24 hour  Intake 3407.17 ml  Output 0 ml  Net 3407.17 ml   Filed Weights   09/05/19 2017 09/06/19 0500  Weight: 81.6 kg 79.6 kg    ROS: Review of Systems  Constitutional: Negative for chills and fever.  Eyes: Negative for blurred vision.  Respiratory: Negative for cough and shortness of breath.   Cardiovascular: Negative for chest pain.  Gastrointestinal: Positive for abdominal pain and diarrhea. Negative for constipation, nausea and vomiting.  Genitourinary: Negative for dysuria.  Musculoskeletal: Negative for joint pain.  Neurological: Negative for dizziness and headaches.   Exam: Physical Exam  Constitutional: He is oriented to person, place, and time.  HENT:  Nose: No mucosal edema.  Mouth/Throat: No oropharyngeal exudate or posterior oropharyngeal edema.  Eyes: Pupils are equal, round, and reactive to light. Conjunctivae, EOM and lids are normal.  Neck: Carotid bruit is not present.  Cardiovascular: S1 normal and S2 normal. Exam reveals no gallop.  No murmur heard. Pulses:      Dorsalis pedis pulses are 2+ on the right side and 2+ on the left side.  Respiratory: No respiratory distress. He has no wheezes. He has no rhonchi. He has no rales.  GI: Soft. Bowel sounds are normal. There is abdominal tenderness in the left lower quadrant.  Musculoskeletal:     Right ankle: No swelling.     Left ankle:  No swelling.  Lymphadenopathy:    He has no cervical adenopathy.  Neurological: He is alert and oriented to person, place, and time. No cranial nerve deficit.  Skin: Skin is warm. No rash noted. Nails show no clubbing.  Psychiatric: He has a normal mood and affect.      Data Reviewed: Basic Metabolic Panel: Recent Labs  Lab 09/06/19 0558 09/08/19 0605 09/09/19 0427 09/10/19 0707 09/11/19 0416  NA 141 138 139 139 141  K 3.8 3.5 3.7 3.2* 3.6  CL 104 105 105 106 109  CO2 22 21* 23 22 24   GLUCOSE 126* 79 72 106* 122*  BUN 19 11 8 8  <5*  CREATININE 1.14 0.87 0.88 0.79 0.66  CALCIUM 9.1 8.0* 8.3* 8.1* 8.5*  MG  --  1.7 2.1 1.7 1.9   Liver Function Tests: Recent Labs  Lab 09/05/19 1637  AST 23  ALT 22  ALKPHOS 87  BILITOT 1.0  PROT 9.2*  ALBUMIN 4.6   Recent Labs  Lab 09/05/19 1637  LIPASE 25   CBC: Recent Labs  Lab 09/07/19 0457 09/08/19 0605 09/09/19 0427 09/10/19 0707 09/11/19 0416  WBC 9.4 13.7* 16.0* 14.4* 13.1*  HGB 14.7 12.8* 12.9* 11.9* 12.3*  HCT 45.3 39.6 39.8 34.9* 37.7*  MCV 97.0 97.5 97.8 91.6 95.4  PLT 226 196 212 214 248    CBG: Recent Labs  Lab 09/10/19 2136 09/10/19 2327 09/11/19 0502 09/11/19 0751 09/11/19 1152  GLUCAP 146* 139* 115* 106* 140*  Recent Results (from the past 240 hour(s))  Respiratory Panel by RT PCR (Flu A&B, Covid) - Nasopharyngeal Swab     Status: None   Collection Time: 09/05/19  8:13 PM   Specimen: Nasopharyngeal Swab  Result Value Ref Range Status   SARS Coronavirus 2 by RT PCR NEGATIVE NEGATIVE Final    Comment: (NOTE) SARS-CoV-2 target nucleic acids are NOT DETECTED. The SARS-CoV-2 RNA is generally detectable in upper respiratoy specimens during the acute phase of infection. The lowest concentration of SARS-CoV-2 viral copies this assay can detect is 131 copies/mL. A negative result does not preclude SARS-Cov-2 infection and should not be used as the sole basis for treatment or other patient  management decisions. A negative result may occur with  improper specimen collection/handling, submission of specimen other than nasopharyngeal swab, presence of viral mutation(s) within the areas targeted by this assay, and inadequate number of viral copies (<131 copies/mL). A negative result must be combined with clinical observations, patient history, and epidemiological information. The expected result is Negative. Fact Sheet for Patients:  https://www.moore.com/https://www.fda.gov/media/142436/download Fact Sheet for Healthcare Providers:  https://www.young.biz/https://www.fda.gov/media/142435/download This test is not yet ap proved or cleared by the [place] FDA and  has been authorized for detection and/or diagnosis of SARS-CoV-2 by FDA under an Emergency Use Authorization (EUA). This EUA will remain  in effect (meaning this test can be used) for the duration of the COVID-19 declaration under Section 564(b)(1) of the Act, 21 U.S.C. section 360bbb-3(b)(1), unless the authorization is terminated or revoked sooner.    Influenza A by PCR NEGATIVE NEGATIVE Final   Influenza B by PCR NEGATIVE NEGATIVE Final    Comment: (NOTE) The Xpert Xpress SARS-CoV-2/FLU/RSV assay is intended as an aid in  the diagnosis of influenza from Nasopharyngeal swab specimens and  should not be used as a sole basis for treatment. Nasal washings and  aspirates are unacceptable for Xpert Xpress SARS-CoV-2/FLU/RSV  testing. Fact Sheet for Patients: https://www.moore.com/https://www.fda.gov/media/142436/download Fact Sheet for Healthcare Providers: https://www.young.biz/https://www.fda.gov/media/142435/download This test is not yet approved or cleared by the [place] FDA and  has been authorized for detection and/or diagnosis of SARS-CoV-2 by  FDA under an Emergency Use Authorization (EUA). This EUA will remain  in effect (meaning this test can be used) for the duration of the  Covid-19 declaration under Section 564(b)(1) of the Act, 21  U.S.C. section 360bbb-3(b)(1), unless the  authorization is  terminated or revoked. Performed at [facility], [address] [place], [place] [address]   Blooc Cultures x 2     Status: None (Preliminary result)   Collection Time: 09/09/19  9:41 AM   Specimen: BLOOD  Result Value Ref Range Status   Specimen Description BLOOD RIGHT ANTECUBITAL  Final   Special Requests   Final    BOTTLES DRAWN AEROBIC AND ANAEROBIC Blood Culture adequate volume   Culture   Final    NO GROWTH 2 DAYS Performed at [facility], [address] [place], [place] [address]    Report Status PENDING  Incomplete  Blooc Cultures x 2     Status: None (Preliminary result)   Collection Time: 09/09/19 11:18 AM   Specimen: BLOOD  Result Value Ref Range Status   Specimen Description BLOOD BLOOD RIGHT HAND  Final   Special Requests   Final    BOTTLES DRAWN AEROBIC AND ANAEROBIC Blood Culture adequate volume   Culture   Final    NO GROWTH 2 DAYS Performed at [facility], [address] [place], [place] [address]    Report Status PENDING  Incomplete     Studies: DG Abd Portable 2V  Result Date: 09/10/2019 CLINICAL DATA:  59 year old male with small bowel obstruction versus ileus on CT 09/05/2019. Subsequent encounter. EXAM: PORTABLE ABDOMEN - 2 VIEW COMPARISON:  09/09/2019 and earlier. FINDINGS: Portable upright and supine views of the abdomen and pelvis at 0548 hours. No pneumoperitoneum. Stable enteric tube, side hole in the proximal stomach. Further improved bowel gas pattern since yesterday. Fewer dilated small bowel in the left mid abdomen, now primarily 1 4 centimeter diameter gas-filled loop. Increased non distended distal small bowel gas. Less dilated large bowel. There is a staple line redemonstrated in the left abdomen. Other abdominal and pelvic visceral contours appear normal. No acute osseous abnormality identified. IMPRESSION: 1. Stable enteric tube.  No free air. 2. Further improved bowel gas pattern since  yesterday. One dilated 4 cm gas-filled left mid abdominal small bowel loop persists. Electronically Signed   By: Odessa Fleming M.D.   On: 09/10/2019 08:13    Scheduled Meds: . atomoxetine  80 mg Oral Daily  . budesonide (PULMICORT) nebulizer solution  0.5 mg Nebulization BID  . DULoxetine  120 mg Oral Daily  . finasteride  5 mg Oral Daily  . fluticasone  2 spray Each Nare Daily  . insulin aspart  0-5 Units Subcutaneous QHS  . insulin aspart  0-9 Units Subcutaneous TID WC  . ipratropium-albuterol  3 mL Nebulization Q6H  . isosorbide mononitrate  30 mg Oral Daily  . levothyroxine  50 mcg Oral QAC breakfast  . loratadine  10 mg Oral Daily  . metoprolol succinate  50 mg Oral Daily  . mirtazapine  15 mg Oral Daily  . nicotine  21 mg Transdermal Daily  . pantoprazole  40 mg Oral Daily  . pravastatin  80 mg Oral q1800  . pregabalin  300 mg Oral BID  . sodium chloride flush  10-40 mL Intracatheter Q12H  . tamsulosin  0.4 mg Oral Daily  . traZODone  300 mg Oral QHS  . warfarin  4 mg Oral ONCE-1800  . Warfarin - Pharmacist Dosing Inpatient   Does not apply q1800  . ziprasidone  60 mg Oral BID WC   Continuous Infusions: . heparin 2,000 Units/hr (09/11/19 1304)    Assessment/Plan:  1. Early small bowel obstruction versus ileus.  Conservative management as per general surgery.  Advance to full liquid diet today.  Continue to monitor closely.  Once tolerate solid food and feeling better can go home. 2. COPD with wheeze.  Start nebulizer treatments.  Encourage ambulation.  Try to hold off on steroids at this point. 3. Essential hypertension on metoprolol 4. Hypothyroidism unspecified on Synthroid 5. 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  Restart Coumadin. 6. Bilateral lower extremity neuropathy on Lyrica 7. Impaired fasting glucose.  Hemoglobin A1c 5.9. 8. Hyperlipidemia on pravastatin 9. GERD on PPI  Code Status:     Code Status Orders  (From admission, onward)         Start      Ordered   09/05/19 2343  Full code  Continuous     09/05/19 2348        Code Status History    Date Active Date Inactive Code Status Order ID Comments User Context   08/23/2018 2133 08/29/2018 1637 Full Code [id number]  SalaryEvelena Asa, MD Inpatient   Advance Care Planning Activity     Disposition Plan: Once tolerates solid food  and feeling better can potentially go home  Consultants:  General surgery  Time spent: 28 minutes  [place]

## 2019-09-12 DIAGNOSIS — K219 Gastro-esophageal reflux disease without esophagitis: Secondary | ICD-10-CM

## 2019-09-12 LAB — BASIC METABOLIC PANEL
Anion gap: 11 (ref 5–15)
BUN: <5 mg/dL — ABNORMAL LOW (ref 6–20)
CO2: 25 mmol/L (ref 22–32)
Calcium: 9.2 mg/dL (ref 8.9–10.3)
Chloride: 105 mmol/L (ref 98–111)
Creatinine, Ser: 0.85 mg/dL (ref 0.61–1.24)
GFR calc Af Amer: >60 mL/min (ref >60)
GFR calc non Af Amer: >60 mL/min (ref >60)
Glucose, Bld: 111 mg/dL — ABNORMAL HIGH (ref 70–99)
Potassium: 3.8 mmol/L (ref 3.5–5.1)
Sodium: 141 mmol/L (ref 135–145)

## 2019-09-12 LAB — CBC
HCT: 36.7 % — ABNORMAL LOW (ref 39.0–52.0)
Hemoglobin: 12.7 g/dL — ABNORMAL LOW (ref 13.0–17.0)
MCH: 31.4 pg (ref 26.0–34.0)
MCHC: 34.6 g/dL (ref 30.0–36.0)
MCV: 90.8 fL (ref 80.0–100.0)
Platelets: 233 10*3/uL (ref 150–400)
RBC: 4.04 MIL/uL — ABNORMAL LOW (ref 4.22–5.81)
RDW: 13.4 % (ref 11.5–15.5)
WBC: 13.9 10*3/uL — ABNORMAL HIGH (ref 4.0–10.5)
nRBC: 0.2 % (ref 0.0–0.2)

## 2019-09-12 LAB — HEPARIN LEVEL (UNFRACTIONATED): Heparin Unfractionated: 0.62 IU/mL (ref 0.30–0.70)

## 2019-09-12 LAB — PROTIME-INR
INR: 1.1 (ref 0.8–1.2)
Prothrombin Time: 13.9 seconds (ref 11.4–15.2)

## 2019-09-12 LAB — MAGNESIUM: Magnesium: 1.6 mg/dL — ABNORMAL LOW (ref 1.7–2.4)

## 2019-09-12 MED ORDER — NICOTINE 21 MG/24HR TD PT24: MEDICATED_PATCH | TRANSDERMAL | 0 refills | Status: DC

## 2019-09-12 MED ORDER — IPRATROPIUM-ALBUTEROL 20-100 MCG/ACT IN AERS: 2.0000 | INHALATION_SPRAY | Freq: Four times a day (QID) | RESPIRATORY_TRACT | 0 refills | Status: DC

## 2019-09-12 MED ORDER — ISOSORBIDE MONONITRATE ER 30 MG PO TB24: 30.0000 mg | ORAL_TABLET | Freq: Every day | ORAL | 0 refills | Status: DC

## 2019-09-12 MED ORDER — NYSTATIN 100000 UNIT/ML MT SUSP
5.0000 mL | Freq: Four times a day (QID) | OROMUCOSAL | Status: DC
  Administered 2019-09-12: 500000 [IU] via ORAL
  Filled 2019-09-12: qty 5

## 2019-09-12 MED ORDER — ONDANSETRON HCL 4 MG PO TABS: 4.0000 mg | ORAL_TABLET | Freq: Four times a day (QID) | ORAL | 0 refills | Status: DC | PRN

## 2019-09-12 MED ORDER — POLYETHYLENE GLYCOL 3350 17 G PO PACK: 17.0000 g | PACK | Freq: Every day | ORAL | 0 refills | Status: DC | PRN

## 2019-09-12 MED ORDER — DULERA 100-5 MCG/ACT IN AERO: 2.0000 | INHALATION_SPRAY | Freq: Two times a day (BID) | RESPIRATORY_TRACT | 0 refills | Status: DC

## 2019-09-12 MED ORDER — WARFARIN SODIUM 6 MG PO TABS
6.0000 mg | ORAL_TABLET | Freq: Every day | ORAL | Status: DC
  Filled 2019-09-12: qty 1

## 2019-09-12 MED ORDER — WARFARIN SODIUM 2 MG PO TABS
2.0000 mg | ORAL_TABLET | Freq: Once | ORAL | Status: AC
  Administered 2019-09-12: 09:00:00 2 mg via ORAL
  Filled 2019-09-12: qty 1

## 2019-09-12 MED ORDER — MAGNESIUM SULFATE 2 GM/50ML IV SOLN
2.0000 g | Freq: Once | INTRAVENOUS | Status: AC
  Administered 2019-09-12: 2 g via INTRAVENOUS
  Filled 2019-09-12: qty 50

## 2019-09-12 MED ORDER — PREGABALIN 75 MG PO CAPS: 300.0000 mg | ORAL_CAPSULE | Freq: Two times a day (BID) | ORAL | Status: DC

## 2019-09-12 MED ORDER — NYSTATIN 100000 UNIT/ML MT SUSP: 5.0000 mL | Freq: Four times a day (QID) | OROMUCOSAL | 0 refills | Status: AC

## 2019-09-12 NOTE — Discharge Summary (Signed)
[facility] [place] [facility]   PATIENT NAME: Brett Fernandez    MR#:  [id number]  DATE OF BIRTH:  Jan 18, 1961  DATE OF ADMISSION:  09/05/2019 ADMITTING PHYSICIAN: Hannah Beat, MD  DATE OF DISCHARGE: 09/12/2019  2:59 PM  PRIMARY CARE PHYSICIAN: Dortha Kern, MD    ADMISSION DIAGNOSIS:  Small bowel obstruction (HCC) [K56.609] SBO (small bowel obstruction) (HCC) [K56.609] Nausea and vomiting, intractability of vomiting not specified, unspecified vomiting type [R11.2]  DISCHARGE DIAGNOSIS:  Principal Problem:   Small bowel obstruction (HCC) Active Problems:   Chronic obstructive pulmonary disease (HCC)   Chronic anticoagulation (Coumadin)   Chronic bronchitis (HCC)   Depression   SBO (small bowel obstruction) (HCC)   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 Allergy   . Anginal pain (HCC)   . Bipolar disorder (HCC)   . BPH (benign prostatic hypertrophy)   . CHF (congestive heart failure) (HCC)   . COPD (chronic obstructive pulmonary disease) (HCC)   . COPD exacerbation (HCC) 11/02/2015  . Coronary artery disease   . Deep vein thrombosis (DVT) (HCC)   . DJD (degenerative joint disease)   . Gastric ulcer   . GERD (gastroesophageal reflux disease)   . History of hiatal hernia   . Hypertension   . Hypothyroidism   . Kidney stones   . Lethargy 11/02/2015  . Neuromuscular disorder (HCC)   . PTSD (post-traumatic stress disorder)     HOSPITAL COURSE:   1.  Early small bowel obstruction versus ileus.  The patient and surgery went with conservative management.  The patient was slowly advanced on diet.  He tolerated solid food on the day of discharge.  MiraLAX as needed upon going home. 2.  COPD with wheeze.  I did start nebulizer treatments during the hospital course and wheeze has improved.  I encouraged ambulation.  I held off on  systemic steroids but I did prescribe Combivent and Dulera inhalers upon going home. 3.  Essential hypertension on metoprolol 4.  Hypothyroidism unspecified on Synthroid 5.  Recurrent DVT on heparin drip during the hospital course just in case surgery was needed.  We restarted Coumadin.  Recommend checking an INR upon follow-up appointment. 6.  Bilateral lower extremity neuropathy on Lyrica 7.  Impaired fasting glucose.  Hemoglobin A1c 5.9. 8.  Hyperlipidemia on pravastatin 9.  GERD on PPI 10.  Chronic pain follows up with pain management as outpatient  DISCHARGE CONDITIONS:   Satisfactory  CONSULTS OBTAINED:  Treatment Team:  Carolan Shiver, MD  DRUG ALLERGIES:   Allergies  Allergen Reactions  . Aspirin Other (See Comments)    Other Reaction: steven-johnson reaction [name]  . Bee Venom Anaphylaxis  . Sulfa Antibiotics Swelling    Other reaction(s): Unknown    DISCHARGE MEDICATIONS:   Allergies as of 09/12/2019      Reactions   Aspirin Other (See Comments)   Other Reaction: steven-johnson reaction [name]   Bee Venom Anaphylaxis   Sulfa Antibiotics Swelling   Other reaction(s): Unknown      Medication List    STOP taking these medications   meloxicam 15 MG tablet Commonly known as: MOBIC     TAKE these medications   atomoxetine 40 MG capsule Commonly known as: STRATTERA Take 80 mg by mouth daily.   Dulera 100-5 MCG/ACT Aero Generic drug: mometasone-formoterol Inhale 2  puffs into the lungs 2 (two) times daily.   DULoxetine 60 MG capsule Commonly known as: CYMBALTA Take 120 mg by mouth daily.   finasteride 5 MG tablet Commonly known as: PROSCAR Take 5 mg by mouth daily.   fluticasone 50 MCG/ACT nasal spray Commonly known as: FLONASE Place 2 sprays into both nostrils daily.   Ipratropium-Albuterol 20-100 MCG/ACT Aers respimat Commonly known as: COMBIVENT Inhale 2 puffs into the lungs every 6 (six) hours.    isosorbide mononitrate 30 MG 24 hr tablet Commonly known as: IMDUR Take 1 tablet (30 mg total) by mouth daily.   levothyroxine 50 MCG tablet Commonly known as: SYNTHROID Take 50 mcg by mouth daily before breakfast.   loratadine 10 MG tablet Commonly known as: CLARITIN Take 10 mg by mouth daily.   metoprolol succinate 50 MG 24 hr tablet Commonly known as: TOPROL-XL Take 50 mg by mouth daily.   mirtazapine 15 MG tablet Commonly known as: REMERON Take 15 mg by mouth daily.   nicotine 21 mg/24hr patch Commonly known as: NICODERM CQ - dosed in mg/24 hours Apply 21mg  patch transdermal to skin daily (okay to use generic)   nitroGLYCERIN 0.4 MG SL tablet Commonly known as: NITROSTAT Place 0.4 mg under the tongue every 5 (five) minutes as needed for chest pain.   nystatin 100000 UNIT/ML suspension Commonly known as: MYCOSTATIN Take 5 mLs (500,000 Units total) by mouth 4 (four) times daily for 10 days.   omeprazole 40 MG capsule Commonly known as: PRILOSEC Take 40 mg by mouth daily.   ondansetron 4 MG tablet Commonly known as: ZOFRAN Take 1 tablet (4 mg total) by mouth every 6 (six) hours as needed for nausea.   oxyCODONE 5 MG immediate release tablet Commonly known as: Oxy IR/ROXICODONE Take 1 tablet (5 mg total) by mouth every 6 (six) hours as needed for severe pain.   oxyCODONE 5 MG immediate release tablet Commonly known as: Oxy IR/ROXICODONE Take 1 tablet (5 mg total) by mouth 4 (four) times daily. Max: 4/day   oxyCODONE 5 MG immediate release tablet Commonly known as: Oxy IR/ROXICODONE Take 1 tablet (5 mg total) by mouth 3 (three) times daily. Max: 3/day   oxyCODONE 5 MG immediate release tablet Commonly known as: Oxy IR/ROXICODONE Take 1 tablet (5 mg total) by mouth 2 (two) times daily. Max: 2/day   oxyCODONE 5 MG immediate release tablet Commonly known as: Oxy IR/ROXICODONE Take 1 tablet (5 mg total) by mouth daily. Max: 1/day   oxyCODONE-acetaminophen  10-325 MG tablet Commonly known as: PERCOCET Take 1 tablet by mouth every 4 (four) hours as needed for pain.   polyethylene glycol 17 g packet Commonly known as: MIRALAX / GLYCOLAX Take 17 g by mouth daily as needed. What changed:   when to take this  reasons to take this   potassium citrate 10 MEQ (1080 MG) SR tablet Commonly known as: UROCIT-K Take 20 mEq by mouth 2 (two) times daily.   pravastatin 80 MG tablet Commonly known as: PRAVACHOL Take 80 mg by mouth at bedtime.   pregabalin 75 MG capsule Commonly known as: Lyrica Take 4 capsules (300 mg total) by mouth 2 (two) times daily. What changed:   how much to take  when to take this  Another medication with the same name was removed. Continue taking this medication, and follow the directions you see here.   sildenafil 20 MG tablet Commonly known as: REVATIO Take 3 tablets by mouth as needed.   tamsulosin 0.4  MG Caps capsule Commonly known as: FLOMAX Take 0.4 mg by mouth daily.   traZODone 100 MG tablet Commonly known as: DESYREL Take 300 mg by mouth at bedtime.   warfarin 4 MG tablet Commonly known as: COUMADIN Take 4 mg by mouth every evening.   ziprasidone 60 MG capsule Commonly known as: GEODON Take 60 mg by mouth 2 (two) times daily with a meal. Notes to patient: 09/12/19        DISCHARGE INSTRUCTIONS:   Satisfactory  If you experience worsening of your admission symptoms, develop shortness of breath, life threatening emergency, suicidal or homicidal thoughts you must seek medical attention immediately by calling 911 or calling your MD immediately  if symptoms less severe.  You Must read complete instructions/literature along with all the possible adverse reactions/side effects for all the Medicines you take and that have been prescribed to you. Take any new Medicines after you have completely understood and accept all the possible adverse reactions/side effects.   Please note  You were cared  for by a hospitalist during your hospital stay. If you have any questions about your discharge medications or the care you received while you were in the hospital after you are discharged, you can call the unit and asked to speak with the hospitalist on call if the hospitalist that took care of you is not available. Once you are discharged, your primary care physician will handle any further medical issues. Please note that NO REFILLS for any discharge medications will be authorized once you are discharged, as it is imperative that you return to your primary care physician (or establish a relationship with a primary care physician if you do not have one) for your aftercare needs so that they can reassess your need for medications and monitor your lab values.    Today   CHIEF COMPLAINT:   Chief Complaint  Patient presents with  . Emesis  . Fever    HISTORY OF PRESENT ILLNESS:  Brett Fernandez  is a 58 y.o. male came in with fever and vomiting   VITAL SIGNS:  Blood pressure (!) 151/92, pulse 93, temperature 99.7 F (37.6 C), temperature source Oral, resp. rate 18, height 5\' 8"  (1.727 m), weight 79.6 kg, SpO2 95 %.  I/O:    Intake/Output Summary (Last 24 hours) at 09/12/2019 1555 Last data filed at 09/12/2019 1300 Gross per 24 hour  Intake 989.65 ml  Output 0 ml  Net 989.65 ml    PHYSICAL EXAMINATION:  GENERAL:  58 y.o.-year-old patient lying in the bed with no acute distress.  EYES: Pupils equal, round, reactive to light and accommodation. No scleral icterus. Extraocular muscles intact.  HEENT: Head atraumatic, normocephalic. Oropharynx and nasopharynx clear.  NECK:  Supple, no jugular venous distention. No thyroid enlargement, no tenderness.  LUNGS: Normal breath sounds bilaterally, no wheezing, rales,rhonchi or crepitation. No use of accessory muscles of respiration.  CARDIOVASCULAR: S1, S2 normal. No murmurs, rubs, or gallops.  ABDOMEN: Soft, non-tender, non-distended. Bowel  sounds present. No organomegaly or mass.  EXTREMITIES: No pedal edema, cyanosis, or clubbing.  NEUROLOGIC: Cranial nerves II through XII are intact. Muscle strength 5/5 in all extremities. Sensation intact. Gait not checked.  PSYCHIATRIC: The patient is alert and oriented x 3.  SKIN: No obvious rash, lesion, or ulcer.   DATA REVIEW:   CBC Recent Labs  Lab 09/12/19 0434  WBC 13.9*  HGB 12.7*  HCT 36.7*  PLT 233    Chemistries  Recent Labs  Lab 09/05/19  1637 09/06/19 0558 09/12/19 0434  NA 139  --  141  K 4.1  --  3.8  CL 101  --  105  CO2 20*  --  25  GLUCOSE 133*  --  111*  BUN 13  --  <5*  CREATININE 1.20  --  0.85  CALCIUM 10.3  --  9.2  MG  --    < > 1.6*  AST 23  --   --   ALT 22  --   --   ALKPHOS 87  --   --   BILITOT 1.0  --   --    < > = values in this interval not displayed.    Microbiology Results  Results for orders placed or performed during the hospital encounter of 09/05/19  Respiratory Panel by RT PCR (Flu A&B, Covid) - Nasopharyngeal Swab     Status: None   Collection Time: 09/05/19  8:13 PM   Specimen: Nasopharyngeal Swab  Result Value Ref Range Status   SARS Coronavirus 2 by RT PCR NEGATIVE NEGATIVE Final    Comment: (NOTE) SARS-CoV-2 target nucleic acids are NOT DETECTED. The SARS-CoV-2 RNA is generally detectable in upper respiratoy specimens during the acute phase of infection. The lowest concentration of SARS-CoV-2 viral copies this assay can detect is 131 copies/mL. A negative result does not preclude SARS-Cov-2 infection and should not be used as the sole basis for treatment or other patient management decisions. A negative result may occur with  improper specimen collection/handling, submission of specimen other than nasopharyngeal swab, presence of viral mutation(s) within the areas targeted by this assay, and inadequate number of viral copies (<131 copies/mL). A negative result must be combined with clinical observations, patient  history, and epidemiological information. The expected result is Negative. Fact Sheet for Patients:  https://www.moore.com/https://www.fda.gov/media/142436/download Fact Sheet for Healthcare Providers:  https://www.young.biz/https://www.fda.gov/media/142435/download This test is not yet ap proved or cleared by the [place] FDA and  has been authorized for detection and/or diagnosis of SARS-CoV-2 by FDA under an Emergency Use Authorization (EUA). This EUA will remain  in effect (meaning this test can be used) for the duration of the COVID-19 declaration under Section 564(b)(1) of the Act, 21 U.S.C. section 360bbb-3(b)(1), unless the authorization is terminated or revoked sooner.    Influenza A by PCR NEGATIVE NEGATIVE Final   Influenza B by PCR NEGATIVE NEGATIVE Final    Comment: (NOTE) The Xpert Xpress SARS-CoV-2/FLU/RSV assay is intended as an aid in  the diagnosis of influenza from Nasopharyngeal swab specimens and  should not be used as a sole basis for treatment. Nasal washings and  aspirates are unacceptable for Xpert Xpress SARS-CoV-2/FLU/RSV  testing. Fact Sheet for Patients: https://www.moore.com/https://www.fda.gov/media/142436/download Fact Sheet for Healthcare Providers: https://www.young.biz/https://www.fda.gov/media/142435/download This test is not yet approved or cleared by the [place] FDA and  has been authorized for detection and/or diagnosis of SARS-CoV-2 by  FDA under an Emergency Use Authorization (EUA). This EUA will remain  in effect (meaning this test can be used) for the duration of the  Covid-19 declaration under Section 564(b)(1) of the Act, 21  U.S.C. section 360bbb-3(b)(1), unless the authorization is  terminated or revoked. Performed at [facility], [address] [place], [place] [address]   Blooc Cultures x 2     Status: None (Preliminary result)   Collection Time: 09/09/19  9:41 AM   Specimen: BLOOD  Result Value Ref Range Status   Specimen Description BLOOD RIGHT ANTECUBITAL  Final   Special Requests  Final     BOTTLES DRAWN AEROBIC AND ANAEROBIC Blood Culture adequate volume   Culture   Final    NO GROWTH 3 DAYS Performed at [facility], [address] [place], [place] [address]    Report Status PENDING  Incomplete  Blooc Cultures x 2     Status: None (Preliminary result)   Collection Time: 09/09/19 11:18 AM   Specimen: BLOOD  Result Value Ref Range Status   Specimen Description BLOOD BLOOD RIGHT HAND  Final   Special Requests   Final    BOTTLES DRAWN AEROBIC AND ANAEROBIC Blood Culture adequate volume   Culture   Final    NO GROWTH 3 DAYS Performed at [facility], [address], [place], [place] [address]    Report Status PENDING  Incomplete      Management plans discussed with the patient, and he is in agreement.  CODE STATUS:     Code Status Orders  (From admission, onward)         Start     Ordered   09/05/19 2343  Full code  Continuous     09/05/19 2348        Code Status History    Date Active Date Inactive Code Status Order ID Comments User Context   08/23/2018 2133 08/29/2018 1637 Full Code [id number]  Salary, Avel Peace, MD Inpatient   Advance Care Planning Activity      TOTAL TIME TAKING CARE OF THIS PATIENT: 35 minutes.    Loletha Grayer M.D on 09/12/2019 at 3:55 PM  Between 7am to 6pm - Pager - [phone]  After 6pm go to [email or web address] - password [email or web address]  [facility]  CC: Primary care physician; Lynnell Jude, MD

## 2019-09-12 NOTE — Consult Note (Signed)
ANTICOAGULATION CONSULT NOTE  Pharmacy Consult for heparin and warfarin Indication: VTE prophylaxis  Patient Measurements: Height: 5\' 8"  (172.7 cm) Weight: 175 lb 7.8 oz (79.6 kg) IBW/kg (Calculated) : 68.4  Vital Signs: Temp: 99.7 F (37.6 C) (12/28 0447) Temp Source: Oral (12/28 0447) BP: 151/92 (12/28 0447) Pulse Rate: 93 (12/28 0447)  Labs: Recent Labs    09/10/19 0114 09/10/19 0707 09/11/19 0416 09/12/19 0434  HGB  --  11.9* 12.3* 12.7*  HCT  --  34.9* 37.7* 36.7*  PLT  --  214 248 233  LABPROT  --   --  15.4* 13.9  INR  --   --  1.2 1.1  HEPARINUNFRC 0.54  --  0.47 0.62  CREATININE  --  0.79 0.66 0.85    Estimated Creatinine Clearance: 91.6 mL/min (by C-G formula based on SCr of 0.85 mg/dL).  Medical History: Past Medical History:  Diagnosis Date  . Allergy   . Anginal pain (HCC)   . Bipolar disorder (HCC)   . BPH (benign prostatic hypertrophy)   . CHF (congestive heart failure) (HCC)   . COPD (chronic obstructive pulmonary disease) (HCC)   . COPD exacerbation (HCC) 11/02/2015  . Coronary artery disease   . Deep vein thrombosis (DVT) (HCC)   . DJD (degenerative joint disease)   . Gastric ulcer   . GERD (gastroesophageal reflux disease)   . History of hiatal hernia   . Hypertension   . Hypothyroidism   . Kidney stones   . Lethargy 11/02/2015  . Neuromuscular disorder (HCC)   . PTSD (post-traumatic stress disorder)     Medications:  Scheduled:  . atomoxetine  80 mg Oral Daily  . budesonide (PULMICORT) nebulizer solution  0.5 mg Nebulization BID  . DULoxetine  120 mg Oral Daily  . finasteride  5 mg Oral Daily  . fluticasone  2 spray Each Nare Daily  . ipratropium-albuterol  3 mL Nebulization Q6H  . isosorbide mononitrate  30 mg Oral Daily  . levothyroxine  50 mcg Oral QAC breakfast  . loratadine  10 mg Oral Daily  . metoprolol succinate  50 mg Oral Daily  . mirtazapine  15 mg Oral Daily  . nicotine  21 mg Transdermal Daily  . pantoprazole  40  mg Oral Daily  . pravastatin  80 mg Oral q1800  . pregabalin  300 mg Oral BID  . sodium chloride flush  10-40 mL Intracatheter Q12H  . tamsulosin  0.4 mg Oral Daily  . traZODone  300 mg Oral QHS  . Warfarin - Pharmacist Dosing Inpatient   Does not apply q1800  . ziprasidone  60 mg Oral BID WC    Assessment: 58 y.o. Caucasian male with a known history of CHF, BPH, COPD, GERD and peptic ulcer disease as well as hypertension and hypothyroidism, presented to the emergency room with a SBO. He takes 4 mg warfarin daily for a h/o DVTs. He missed several doses due to nausea. H&H trending down, PLT wnl. He is being switched to heparin in case of need for surgical intervention   Date   INR   Dose 12/27   1.2   4 mg 12/28   1.1    Goal of Therapy:  Heparin level 0.3 - 0.7 units/mL INR 2.0 - 3.0 Monitor platelets by anticoagulation protocol: Yes   Plan:  Heparin Heparin level therapeutic.  CBC stable.  Continue current rate, recheck HL and CBC in am  Warfarin:  INR is subtherapeutic: 2 mg warfarin now  then 6 mg warfarin tonight which is 50% greater than home dose   F/U INR in am  Recommend continuing heparin until INR is > 2 and may discontinue heparin. Daily INR ordered. CBC stable.    Dallie Piles, PharmD 09/12/2019,7:15 AM

## 2019-09-12 NOTE — TOC Transition Note (Signed)
Transition of Care [facility]) - CM/SW Discharge Note   Patient Details  Name: Brett Fernandez MRN: [id number] Date of Birth: 03-Aug-1961  [facility] CM/SW Contact:  Beverly Sessions, RN Phone Number: 09/12/2019, 10:21 AM   Clinical Narrative:     Patient to discharge home today Follow up appointment  Scheduled with PCP  No need identified at discharge, RNCM confirmed with Bedside RN no needs        Patient Goals and CMS Choice        Discharge Placement                       Discharge Plan and Services                                     Social Determinants of Health (SDOH) Interventions     Readmission Risk Interventions Readmission Risk Prevention Plan 09/12/2019 09/08/2019  Transportation Screening Complete Complete  PCP or Specialist Appt within 3-5 Days Complete -  Palliative Care Screening Not Applicable Not Applicable  Medication Review (RN Care Manager) Complete Complete  Some recent data might be hidden

## 2019-09-12 NOTE — Consult Note (Signed)
ANTICOAGULATION CONSULT NOTE  Pharmacy Consult for heparin and warfarin Indication: VTE prophylaxis  Patient Measurements: Height: 5\' 8"  (172.7 cm) Weight: 175 lb 7.8 oz (79.6 kg) IBW/kg (Calculated) : 68.4  Vital Signs: Temp: 99.7 F (37.6 C) (12/28 0447) Temp Source: Oral (12/28 0447) BP: 151/92 (12/28 0447) Pulse Rate: 93 (12/28 0447)  Labs: Recent Labs    09/10/19 0114 09/10/19 0707 09/11/19 0416 09/12/19 0434  HGB  --  11.9* 12.3* 12.7*  HCT  --  34.9* 37.7* 36.7*  PLT  --  214 248 233  LABPROT  --   --  15.4* 13.9  INR  --   --  1.2 1.1  HEPARINUNFRC 0.54  --  0.47 0.62  CREATININE  --  0.79 0.66 0.85    Estimated Creatinine Clearance: 91.6 mL/min (by C-G formula based on SCr of 0.85 mg/dL).  Medical History: Past Medical History:  Diagnosis Date  . Allergy   . Anginal pain (HCC)   . Bipolar disorder (HCC)   . BPH (benign prostatic hypertrophy)   . CHF (congestive heart failure) (HCC)   . COPD (chronic obstructive pulmonary disease) (HCC)   . COPD exacerbation (HCC) 11/02/2015  . Coronary artery disease   . Deep vein thrombosis (DVT) (HCC)   . DJD (degenerative joint disease)   . Gastric ulcer   . GERD (gastroesophageal reflux disease)   . History of hiatal hernia   . Hypertension   . Hypothyroidism   . Kidney stones   . Lethargy 11/02/2015  . Neuromuscular disorder (HCC)   . PTSD (post-traumatic stress disorder)     Medications:  Scheduled:  . atomoxetine  80 mg Oral Daily  . budesonide (PULMICORT) nebulizer solution  0.5 mg Nebulization BID  . DULoxetine  120 mg Oral Daily  . finasteride  5 mg Oral Daily  . fluticasone  2 spray Each Nare Daily  . ipratropium-albuterol  3 mL Nebulization Q6H  . isosorbide mononitrate  30 mg Oral Daily  . levothyroxine  50 mcg Oral QAC breakfast  . loratadine  10 mg Oral Daily  . metoprolol succinate  50 mg Oral Daily  . mirtazapine  15 mg Oral Daily  . nicotine  21 mg Transdermal Daily  . pantoprazole  40  mg Oral Daily  . pravastatin  80 mg Oral q1800  . pregabalin  300 mg Oral BID  . sodium chloride flush  10-40 mL Intracatheter Q12H  . tamsulosin  0.4 mg Oral Daily  . traZODone  300 mg Oral QHS  . Warfarin - Pharmacist Dosing Inpatient   Does not apply q1800  . ziprasidone  60 mg Oral BID WC    Assessment: 58 y.o. Caucasian male with a known history of CHF, BPH, COPD, GERD and peptic ulcer disease as well as hypertension and hypothyroidism, presented to the emergency room with a SBO. He takes 4 mg warfarin daily for a h/o DVTs and his last dose reported to me was 12/19. He missed several doses due to nausea. H&H trending down, PLT wnl. He is being switched to heparin in case of need for surgical intervention  Heparin Course: 12/25 1850 HL 0.42 12/26 0114 HL 0.54   12/27 0416 HL 0.47 12/28 0434 HL 0.62, therapeutic. CBC stable  Date INR Warfarin Dose  12/27 1.2 4 mg        Goal of Therapy:  Heparin level 0.3 - 0.7 units/mL Monitor platelets by anticoagulation protocol: Yes   Plan:  Heparin level therapeutic.  CBC  stable.  Continue current rate, recheck HL and CBC in am  Warfarin: INR is subtherapeutic. Will restart warfarin with heparin bridge. Will give warfarin 4 mg x 1 tonight (home dose). Recommend continuing heparin until INR is > 2 and may discontinue heparin. Daily INR ordered. CBC stable.    Ena Dawley, PharmD 09/12/2019,5:36 AM

## 2019-09-12 NOTE — Progress Notes (Signed)
Brett Fernandez  A and O x 4. VSS. Pt tolerating diet well. No complaints of pain or nausea. IV removed intact, prescriptions given. Pt voiced understanding of discharge instructions with no further questions. Pt discharged via wheelchair with NT.    Allergies as of 09/12/2019      Reactions   Aspirin Other (See Comments)   Other Reaction: steven-johnson reaction [name]   Bee Venom Anaphylaxis   Sulfa Antibiotics Swelling   Other reaction(s): Unknown      Medication List    STOP taking these medications   meloxicam 15 MG tablet Commonly known as: MOBIC     TAKE these medications   atomoxetine 40 MG capsule Commonly known as: STRATTERA Take 80 mg by mouth daily.   Dulera 100-5 MCG/ACT Aero Generic drug: mometasone-formoterol Inhale 2 puffs into the lungs 2 (two) times daily.   DULoxetine 60 MG capsule Commonly known as: CYMBALTA Take 120 mg by mouth daily.   finasteride 5 MG tablet Commonly known as: PROSCAR Take 5 mg by mouth daily.   fluticasone 50 MCG/ACT nasal spray Commonly known as: FLONASE Place 2 sprays into both nostrils daily.   Ipratropium-Albuterol 20-100 MCG/ACT Aers respimat Commonly known as: COMBIVENT Inhale 2 puffs into the lungs every 6 (six) hours.   isosorbide mononitrate 30 MG 24 hr tablet Commonly known as: IMDUR Take 1 tablet (30 mg total) by mouth daily.   levothyroxine 50 MCG tablet Commonly known as: SYNTHROID Take 50 mcg by mouth daily before breakfast.   loratadine 10 MG tablet Commonly known as: CLARITIN Take 10 mg by mouth daily.   metoprolol succinate 50 MG 24 hr tablet Commonly known as: TOPROL-XL Take 50 mg by mouth daily.   mirtazapine 15 MG tablet Commonly known as: REMERON Take 15 mg by mouth daily.   nicotine 21 mg/24hr patch Commonly known as: NICODERM CQ - dosed in mg/24 hours Apply 21mg  patch transdermal to skin daily (okay to use generic)   nitroGLYCERIN 0.4 MG SL tablet Commonly known as:  NITROSTAT Place 0.4 mg under the tongue every 5 (five) minutes as needed for chest pain.   nystatin 100000 UNIT/ML suspension Commonly known as: MYCOSTATIN Take 5 mLs (500,000 Units total) by mouth 4 (four) times daily for 10 days.   omeprazole 40 MG capsule Commonly known as: PRILOSEC Take 40 mg by mouth daily.   ondansetron 4 MG tablet Commonly known as: ZOFRAN Take 1 tablet (4 mg total) by mouth every 6 (six) hours as needed for nausea.   oxyCODONE 5 MG immediate release tablet Commonly known as: Oxy IR/ROXICODONE Take 1 tablet (5 mg total) by mouth every 6 (six) hours as needed for severe pain.   oxyCODONE 5 MG immediate release tablet Commonly known as: Oxy IR/ROXICODONE Take 1 tablet (5 mg total) by mouth 4 (four) times daily. Max: 4/day   oxyCODONE 5 MG immediate release tablet Commonly known as: Oxy IR/ROXICODONE Take 1 tablet (5 mg total) by mouth 3 (three) times daily. Max: 3/day   oxyCODONE 5 MG immediate release tablet Commonly known as: Oxy IR/ROXICODONE Take 1 tablet (5 mg total) by mouth 2 (two) times daily. Max: 2/day   oxyCODONE 5 MG immediate release tablet Commonly known as: Oxy IR/ROXICODONE Take 1 tablet (5 mg total) by mouth daily. Max: 1/day   oxyCODONE-acetaminophen 10-325 MG tablet Commonly known as: PERCOCET Take 1 tablet by mouth every 4 (four) hours as needed for pain.   polyethylene glycol 17 g packet Commonly known  as: MIRALAX / GLYCOLAX Take 17 g by mouth daily as needed. What changed:   when to take this  reasons to take this   potassium citrate 10 MEQ (1080 MG) SR tablet Commonly known as: UROCIT-K Take 20 mEq by mouth 2 (two) times daily.   pravastatin 80 MG tablet Commonly known as: PRAVACHOL Take 80 mg by mouth at bedtime.   pregabalin 75 MG capsule Commonly known as: Lyrica Take 4 capsules (300 mg total) by mouth 2 (two) times daily. What changed:   how much to take  when to take this  Another medication with the  same name was removed. Continue taking this medication, and follow the directions you see here.   sildenafil 20 MG tablet Commonly known as: REVATIO Take 3 tablets by mouth as needed.   tamsulosin 0.4 MG Caps capsule Commonly known as: FLOMAX Take 0.4 mg by mouth daily.   traZODone 100 MG tablet Commonly known as: DESYREL Take 300 mg by mouth at bedtime.   warfarin 4 MG tablet Commonly known as: COUMADIN Take 4 mg by mouth every evening.   ziprasidone 60 MG capsule Commonly known as: GEODON Take 60 mg by mouth 2 (two) times daily with a meal. Notes to patient: 09/12/19       Vitals:   09/11/19 2056 09/12/19 0447  BP: (!) 149/92 (!) 151/92  Pulse: 85 93  Resp: 18 18  Temp: 98.9 F (37.2 C) 99.7 F (37.6 C)  SpO2: 96% 95%    Brett Fernandez

## 2019-09-12 NOTE — Discharge Instructions (Signed)
Bowel Obstruction °A bowel obstruction means that something is blocking the small or large bowel. The bowel is also called the intestine. It is the long tube that connects the stomach to the opening of the butt (anus). When something blocks the bowel, food and fluids cannot pass through like normal. This condition needs to be treated. Treatment depends on the cause of the problem and how bad the problem is. °What are the causes? °Common causes of this condition include: °· Scar tissue (adhesions) from past surgery or from high-energy X-rays (radiation). °· Recent surgery in the belly. This affects how food moves in the bowel. °· Some diseases, such as: °? Irritation of the lining of the digestive tract (Crohn's disease). °? Irritation of small pouches in the bowel (diverticulitis). °· Growths or tumors. °· A bulging organ (hernia). °· Twisting of the bowel (volvulus). °· A foreign body. °· Slipping of a part of the bowel into another part (intussusception). °What are the signs or symptoms? °Symptoms of this condition include: °· Pain in the belly. °· Feeling sick to your stomach (nauseous). °· Throwing up (vomiting). °· Bloating in the belly. °· Being unable to pass gas. °· Trouble pooping (constipation). °· Watery poop (diarrhea). °· A lot of belching. °How is this diagnosed? °This condition may be diagnosed based on: °· A physical exam. °· Medical history. °· Imaging tests, such as X-ray or CT scan. °· Blood tests. °· Urine tests. °How is this treated? °Treatment for this condition may include: °· Fluids and pain medicines that are given through an IV tube. Your doctor may tell you not to eat or drink if you feel sick to your stomach and are throwing up. °· Eating a clear liquid diet for a few days. °· Putting a small tube (nasogastric tube) into the stomach. This will help with pain, discomfort, and nausea by removing blocked air and fluids from the stomach. °· Surgery. This may be needed if other treatments do  not work. °Follow these instructions at home: °Medicines °· Take over-the-counter and prescription medicines only as told by your doctor. °· If you were prescribed an antibiotic medicine, take it as told by your doctor. Do not stop taking the antibiotic even if you start to feel better. °General instructions °· Follow your diet as told by your doctor. You may need to: °? Only drink clear liquids until you start to get better. °? Avoid solid foods. °· Return to your normal activities as told by your doctor. Ask your doctor what activities are safe for you. °· Do not sit for a long time without moving. Get up to take short walks every 1-2 hours. This is important. Ask for help if you feel weak or unsteady. °· Keep all follow-up visits as told by your doctor. This is important. °How is this prevented? °After having a bowel obstruction, you may be more likely to have another. You can do some things to stop it from happening again. °· If you have a long-term (chronic) disease, contact your doctor if you see changes or problems. °· Take steps to prevent or treat trouble pooping. Your doctor may ask that you: °? Drink enough fluid to keep your pee (urine) pale yellow. °? Take over-the-counter or prescription medicines. °? Eat foods that are high in fiber. These include beans, whole grains, and fresh fruits and vegetables. °? Limit foods that are high in fat and sugar. These include fried or sweet foods. °· Stay active. Ask your doctor which exercises are   safe for you. °· Avoid stress. °· Eat three small meals and three small snacks each day. °· Work with a food expert (dietitian) to make a meal plan that works for you. °· Do not use any products that contain nicotine or tobacco, such as cigarettes and e-cigarettes. If you need help quitting, ask your doctor. °Contact a doctor if: °· You have a fever. °· You have chills. °Get help right away if: °· You have pain or cramps that get worse. °· You throw up blood. °· You are  sick to your stomach. °· You cannot stop throwing up. °· You cannot drink fluids. °· You feel mixed up (confused). °· You feel very thirsty (dehydrated). °· Your belly gets more bloated. °· You feel weak or you pass out (faint). °Summary °· A bowel obstruction means that something is blocking the small or large bowel. °· Treatment may include IV fluids and pain medicine. You may also have a clear liquid diet, a small tube in your stomach, or surgery. °· Drink clear liquids and avoid solid foods until you get better. °This information is not intended to replace advice given to you by your health care provider. Make sure you discuss any questions you have with your health care provider. °Document Released: 10/09/2004 Document Revised: 01/13/2018 Document Reviewed: 01/13/2018 °Elsevier Patient Education © 2020 [employer] ° °

## 2019-09-15 LAB — CULTURE, BLOOD (ROUTINE X 2)
Culture: NO GROWTH
Culture: NO GROWTH
Special Requests: ADEQUATE
Special Requests: ADEQUATE

## 2020-03-04 ENCOUNTER — Observation Stay
Admission: EM | Admit: 2020-03-04 | Discharge: 2020-03-05 | Disposition: A | Discharge status: Home / Self Care | Payer: Medicaid Other | Attending: Internal Medicine | Admitting: Internal Medicine

## 2020-03-04 ENCOUNTER — Other Ambulatory Visit: Payer: Self-pay

## 2020-03-04 ENCOUNTER — Observation Stay: Payer: Medicaid Other

## 2020-03-04 ENCOUNTER — Emergency Department: Payer: Medicaid Other

## 2020-03-04 ENCOUNTER — Encounter: Payer: Self-pay | Admitting: Family Medicine

## 2020-03-04 DIAGNOSIS — I509 Heart failure, unspecified: Secondary | ICD-10-CM | POA: Insufficient documentation

## 2020-03-04 DIAGNOSIS — I252 Old myocardial infarction: Secondary | ICD-10-CM | POA: Insufficient documentation

## 2020-03-04 DIAGNOSIS — I25110 Atherosclerotic heart disease of native coronary artery with unstable angina pectoris: Secondary | ICD-10-CM | POA: Diagnosis not present

## 2020-03-04 DIAGNOSIS — R0789 Other chest pain: Secondary | ICD-10-CM | POA: Diagnosis not present

## 2020-03-04 DIAGNOSIS — G35 Multiple sclerosis: Secondary | ICD-10-CM | POA: Insufficient documentation

## 2020-03-04 DIAGNOSIS — K219 Gastro-esophageal reflux disease without esophagitis: Secondary | ICD-10-CM | POA: Diagnosis not present

## 2020-03-04 DIAGNOSIS — Z96651 Presence of right artificial knee joint: Secondary | ICD-10-CM | POA: Insufficient documentation

## 2020-03-04 DIAGNOSIS — K449 Diaphragmatic hernia without obstruction or gangrene: Secondary | ICD-10-CM | POA: Diagnosis not present

## 2020-03-04 DIAGNOSIS — Z9660 Presence of unspecified orthopedic joint implant: Secondary | ICD-10-CM | POA: Insufficient documentation

## 2020-03-04 DIAGNOSIS — F411 Generalized anxiety disorder: Secondary | ICD-10-CM | POA: Diagnosis not present

## 2020-03-04 DIAGNOSIS — R0902 Hypoxemia: Secondary | ICD-10-CM

## 2020-03-04 DIAGNOSIS — Z79899 Other long term (current) drug therapy: Secondary | ICD-10-CM | POA: Insufficient documentation

## 2020-03-04 DIAGNOSIS — I10 Essential (primary) hypertension: Secondary | ICD-10-CM

## 2020-03-04 DIAGNOSIS — Z8249 Family history of ischemic heart disease and other diseases of the circulatory system: Secondary | ICD-10-CM | POA: Insufficient documentation

## 2020-03-04 DIAGNOSIS — Z882 Allergy status to sulfonamides status: Secondary | ICD-10-CM | POA: Insufficient documentation

## 2020-03-04 DIAGNOSIS — J449 Chronic obstructive pulmonary disease, unspecified: Secondary | ICD-10-CM | POA: Diagnosis not present

## 2020-03-04 DIAGNOSIS — Z79891 Long term (current) use of opiate analgesic: Secondary | ICD-10-CM | POA: Insufficient documentation

## 2020-03-04 DIAGNOSIS — J432 Centrilobular emphysema: Secondary | ICD-10-CM | POA: Insufficient documentation

## 2020-03-04 DIAGNOSIS — E039 Hypothyroidism, unspecified: Secondary | ICD-10-CM | POA: Diagnosis not present

## 2020-03-04 DIAGNOSIS — Z86718 Personal history of other venous thrombosis and embolism: Secondary | ICD-10-CM | POA: Diagnosis not present

## 2020-03-04 DIAGNOSIS — Z7951 Long term (current) use of inhaled steroids: Secondary | ICD-10-CM | POA: Insufficient documentation

## 2020-03-04 DIAGNOSIS — Z8673 Personal history of transient ischemic attack (TIA), and cerebral infarction without residual deficits: Secondary | ICD-10-CM | POA: Insufficient documentation

## 2020-03-04 DIAGNOSIS — I110 Hypertensive heart disease with heart failure: Secondary | ICD-10-CM | POA: Diagnosis not present

## 2020-03-04 DIAGNOSIS — F319 Bipolar disorder, unspecified: Secondary | ICD-10-CM | POA: Insufficient documentation

## 2020-03-04 DIAGNOSIS — K589 Irritable bowel syndrome without diarrhea: Secondary | ICD-10-CM | POA: Insufficient documentation

## 2020-03-04 DIAGNOSIS — Z825 Family history of asthma and other chronic lower respiratory diseases: Secondary | ICD-10-CM | POA: Insufficient documentation

## 2020-03-04 DIAGNOSIS — E785 Hyperlipidemia, unspecified: Secondary | ICD-10-CM | POA: Diagnosis not present

## 2020-03-04 DIAGNOSIS — J849 Interstitial pulmonary disease, unspecified: Secondary | ICD-10-CM | POA: Diagnosis not present

## 2020-03-04 DIAGNOSIS — R079 Chest pain, unspecified: Secondary | ICD-10-CM | POA: Diagnosis present

## 2020-03-04 DIAGNOSIS — Z809 Family history of malignant neoplasm, unspecified: Secondary | ICD-10-CM | POA: Insufficient documentation

## 2020-03-04 DIAGNOSIS — I200 Unstable angina: Secondary | ICD-10-CM | POA: Insufficient documentation

## 2020-03-04 DIAGNOSIS — N400 Enlarged prostate without lower urinary tract symptoms: Secondary | ICD-10-CM | POA: Diagnosis not present

## 2020-03-04 DIAGNOSIS — G894 Chronic pain syndrome: Secondary | ICD-10-CM | POA: Diagnosis not present

## 2020-03-04 DIAGNOSIS — Z20822 Contact with and (suspected) exposure to covid-19: Secondary | ICD-10-CM | POA: Diagnosis not present

## 2020-03-04 DIAGNOSIS — Z818 Family history of other mental and behavioral disorders: Secondary | ICD-10-CM | POA: Insufficient documentation

## 2020-03-04 DIAGNOSIS — M797 Fibromyalgia: Secondary | ICD-10-CM | POA: Insufficient documentation

## 2020-03-04 DIAGNOSIS — Z87442 Personal history of urinary calculi: Secondary | ICD-10-CM | POA: Diagnosis not present

## 2020-03-04 DIAGNOSIS — M1990 Unspecified osteoarthritis, unspecified site: Secondary | ICD-10-CM | POA: Insufficient documentation

## 2020-03-04 DIAGNOSIS — K259 Gastric ulcer, unspecified as acute or chronic, without hemorrhage or perforation: Secondary | ICD-10-CM | POA: Insufficient documentation

## 2020-03-04 DIAGNOSIS — Z720 Tobacco use: Secondary | ICD-10-CM

## 2020-03-04 DIAGNOSIS — Z886 Allergy status to analgesic agent status: Secondary | ICD-10-CM | POA: Insufficient documentation

## 2020-03-04 DIAGNOSIS — F17210 Nicotine dependence, cigarettes, uncomplicated: Secondary | ICD-10-CM | POA: Insufficient documentation

## 2020-03-04 DIAGNOSIS — Z833 Family history of diabetes mellitus: Secondary | ICD-10-CM | POA: Insufficient documentation

## 2020-03-04 DIAGNOSIS — Z91030 Bee allergy status: Secondary | ICD-10-CM | POA: Insufficient documentation

## 2020-03-04 DIAGNOSIS — Z7901 Long term (current) use of anticoagulants: Secondary | ICD-10-CM | POA: Insufficient documentation

## 2020-03-04 LAB — LIPID PANEL
Cholesterol: 167 mg/dL (ref 0–200)
HDL: 26 mg/dL — ABNORMAL LOW (ref >40)
LDL Cholesterol: UNDETERMINED mg/dL (ref 0–99)
Total CHOL/HDL Ratio: 6.4 RATIO
Triglycerides: 412 mg/dL — ABNORMAL HIGH (ref <150)
VLDL: UNDETERMINED mg/dL (ref 0–40)

## 2020-03-04 LAB — CBC
HCT: 44.9 % (ref 39.0–52.0)
Hemoglobin: 14.8 g/dL (ref 13.0–17.0)
MCH: 31.9 pg (ref 26.0–34.0)
MCHC: 33 g/dL (ref 30.0–36.0)
MCV: 96.8 fL (ref 80.0–100.0)
Platelets: 226 10*3/uL (ref 150–400)
RBC: 4.64 MIL/uL (ref 4.22–5.81)
RDW: 14.2 % (ref 11.5–15.5)
WBC: 12.2 10*3/uL — ABNORMAL HIGH (ref 4.0–10.5)
nRBC: 0 % (ref 0.0–0.2)

## 2020-03-04 LAB — BASIC METABOLIC PANEL
Anion gap: 7 (ref 5–15)
BUN: 15 mg/dL (ref 6–20)
CO2: 26 mmol/L (ref 22–32)
Calcium: 8.9 mg/dL (ref 8.9–10.3)
Chloride: 105 mmol/L (ref 98–111)
Creatinine, Ser: 1.37 mg/dL — ABNORMAL HIGH (ref 0.61–1.24)
GFR calc Af Amer: >60 mL/min (ref >60)
GFR calc non Af Amer: 56 mL/min — ABNORMAL LOW (ref >60)
Glucose, Bld: 125 mg/dL — ABNORMAL HIGH (ref 70–99)
Potassium: 4 mmol/L (ref 3.5–5.1)
Sodium: 138 mmol/L (ref 135–145)

## 2020-03-04 LAB — TROPONIN I (HIGH SENSITIVITY)
Troponin I (High Sensitivity): 6 ng/L (ref <18)
Troponin I (High Sensitivity): 5 ng/L (ref <18)
Troponin I (High Sensitivity): 5 ng/L (ref <18)

## 2020-03-04 LAB — PROTIME-INR
INR: 2.8 — ABNORMAL HIGH (ref 0.8–1.2)
Prothrombin Time: 28.3 seconds — ABNORMAL HIGH (ref 11.4–15.2)

## 2020-03-04 LAB — SARS CORONAVIRUS 2 BY RT PCR (HOSPITAL ORDER, PERFORMED IN ~~LOC~~ HOSPITAL LAB): SARS Coronavirus 2: NEGATIVE

## 2020-03-04 LAB — LDL CHOLESTEROL, DIRECT: Direct LDL: 81.3 mg/dL (ref 0–99)

## 2020-03-04 MED ORDER — IOHEXOL 350 MG/ML SOLN
75.0000 mL | Freq: Once | INTRAVENOUS | Status: AC | PRN
  Administered 2020-03-04: 75 mL via INTRAVENOUS

## 2020-03-04 MED ORDER — ZOLPIDEM TARTRATE 5 MG PO TABS: 5.0000 mg | ORAL_TABLET | Freq: Every evening | ORAL | Status: DC | PRN

## 2020-03-04 MED ORDER — PRAVASTATIN SODIUM 40 MG PO TABS
80.0000 mg | ORAL_TABLET | Freq: Every day | ORAL | Status: DC
  Administered 2020-03-04: 80 mg via ORAL
  Filled 2020-03-04: qty 2

## 2020-03-04 MED ORDER — FINASTERIDE 5 MG PO TABS
5.0000 mg | ORAL_TABLET | Freq: Every day | ORAL | Status: DC
  Administered 2020-03-05: 5 mg via ORAL
  Filled 2020-03-04: qty 1

## 2020-03-04 MED ORDER — LIDOCAINE VISCOUS HCL 2 % MT SOLN
15.0000 mL | Freq: Once | OROMUCOSAL | Status: AC
  Administered 2020-03-04: 15 mL via ORAL
  Filled 2020-03-04: qty 15

## 2020-03-04 MED ORDER — MIRTAZAPINE 15 MG PO TABS
15.0000 mg | ORAL_TABLET | Freq: Every day | ORAL | Status: DC
  Administered 2020-03-04: 15 mg via ORAL
  Filled 2020-03-04 (×2): qty 1

## 2020-03-04 MED ORDER — FLUTICASONE PROPIONATE 50 MCG/ACT NA SUSP
1.0000 | Freq: Every day | NASAL | Status: DC
  Administered 2020-03-05: 1 via NASAL
  Filled 2020-03-04: qty 16

## 2020-03-04 MED ORDER — MOMETASONE FURO-FORMOTEROL FUM 100-5 MCG/ACT IN AERO
2.0000 | INHALATION_SPRAY | Freq: Two times a day (BID) | RESPIRATORY_TRACT | Status: DC
  Administered 2020-03-04 – 2020-03-05 (×2): 2 via RESPIRATORY_TRACT
  Filled 2020-03-04: qty 8.8

## 2020-03-04 MED ORDER — WARFARIN SODIUM 4 MG PO TABS
4.0000 mg | ORAL_TABLET | Freq: Every day | ORAL | Status: DC
  Administered 2020-03-04: 4 mg via ORAL
  Filled 2020-03-04 (×2): qty 1

## 2020-03-04 MED ORDER — NICOTINE 21 MG/24HR TD PT24
21.0000 mg | MEDICATED_PATCH | Freq: Every day | TRANSDERMAL | Status: DC
  Administered 2020-03-04 – 2020-03-05 (×2): 21 mg via TRANSDERMAL
  Filled 2020-03-04 (×2): qty 1

## 2020-03-04 MED ORDER — WARFARIN - PHARMACIST DOSING INPATIENT
Freq: Every day | Status: DC
  Filled 2020-03-04: qty 1

## 2020-03-04 MED ORDER — ATOMOXETINE HCL 60 MG PO CAPS
80.0000 mg | ORAL_CAPSULE | Freq: Every day | ORAL | Status: DC
  Administered 2020-03-05: 80 mg via ORAL
  Filled 2020-03-04: qty 2

## 2020-03-04 MED ORDER — ONDANSETRON HCL 4 MG/2ML IJ SOLN: 4.0000 mg | Freq: Once | INTRAMUSCULAR | Status: DC

## 2020-03-04 MED ORDER — TRAZODONE HCL 100 MG PO TABS
300.0000 mg | ORAL_TABLET | Freq: Every day | ORAL | Status: DC
  Administered 2020-03-04: 300 mg via ORAL
  Filled 2020-03-04: qty 3

## 2020-03-04 MED ORDER — ALPRAZOLAM 0.25 MG PO TABS
0.2500 mg | ORAL_TABLET | Freq: Two times a day (BID) | ORAL | Status: DC | PRN
  Administered 2020-03-04: 0.25 mg via ORAL
  Filled 2020-03-04: qty 1

## 2020-03-04 MED ORDER — SODIUM CHLORIDE 0.9 % IV SOLN: INTRAVENOUS | Status: DC

## 2020-03-04 MED ORDER — NITROGLYCERIN 0.4 MG SL SUBL
0.4000 mg | SUBLINGUAL_TABLET | SUBLINGUAL | Status: AC | PRN
  Administered 2020-03-04 (×4): 0.4 mg via SUBLINGUAL
  Filled 2020-03-04 (×2): qty 1

## 2020-03-04 MED ORDER — PREGABALIN 75 MG PO CAPS
300.0000 mg | ORAL_CAPSULE | Freq: Two times a day (BID) | ORAL | Status: DC
  Administered 2020-03-04 (×2): 300 mg via ORAL
  Filled 2020-03-04 (×5): qty 4

## 2020-03-04 MED ORDER — ALUM & MAG HYDROXIDE-SIMETH 200-200-20 MG/5ML PO SUSP
30.0000 mL | Freq: Once | ORAL | Status: AC
  Administered 2020-03-04: 30 mL via ORAL
  Filled 2020-03-04: qty 30

## 2020-03-04 MED ORDER — DULOXETINE HCL 30 MG PO CPEP
60.0000 mg | ORAL_CAPSULE | Freq: Two times a day (BID) | ORAL | Status: DC
  Administered 2020-03-04 – 2020-03-05 (×3): 60 mg via ORAL
  Filled 2020-03-04: qty 2
  Filled 2020-03-04: qty 1
  Filled 2020-03-04: qty 2

## 2020-03-04 MED ORDER — MORPHINE SULFATE (PF) 4 MG/ML IV SOLN
4.0000 mg | Freq: Once | INTRAVENOUS | Status: AC
  Administered 2020-03-04: 4 mg via INTRAVENOUS
  Filled 2020-03-04: qty 1

## 2020-03-04 MED ORDER — MORPHINE SULFATE (PF) 2 MG/ML IV SOLN: 1.0000 mg | Freq: Four times a day (QID) | INTRAVENOUS | Status: DC | PRN

## 2020-03-04 MED ORDER — ZIPRASIDONE HCL 40 MG PO CAPS
60.0000 mg | ORAL_CAPSULE | Freq: Two times a day (BID) | ORAL | Status: DC
  Administered 2020-03-04: 60 mg via ORAL
  Filled 2020-03-04 (×4): qty 1

## 2020-03-04 MED ORDER — METOPROLOL TARTRATE 25 MG PO TABS
25.0000 mg | ORAL_TABLET | Freq: Two times a day (BID) | ORAL | Status: DC
  Administered 2020-03-04 – 2020-03-05 (×3): 25 mg via ORAL
  Filled 2020-03-04 (×3): qty 1

## 2020-03-04 MED ORDER — ONDANSETRON HCL 4 MG/2ML IJ SOLN: 4.0000 mg | Freq: Four times a day (QID) | INTRAMUSCULAR | Status: DC | PRN

## 2020-03-04 MED ORDER — POTASSIUM CHLORIDE CRYS ER 20 MEQ PO TBCR
20.0000 meq | EXTENDED_RELEASE_TABLET | Freq: Two times a day (BID) | ORAL | Status: DC
  Administered 2020-03-04 – 2020-03-05 (×2): 20 meq via ORAL
  Filled 2020-03-04 (×2): qty 1

## 2020-03-04 MED ORDER — PANTOPRAZOLE SODIUM 40 MG PO TBEC
40.0000 mg | DELAYED_RELEASE_TABLET | Freq: Every day | ORAL | Status: DC
  Administered 2020-03-04 – 2020-03-05 (×2): 40 mg via ORAL
  Filled 2020-03-04 (×2): qty 1

## 2020-03-04 MED ORDER — ISOSORBIDE MONONITRATE ER 30 MG PO TB24
30.0000 mg | ORAL_TABLET | Freq: Every day | ORAL | Status: DC
  Administered 2020-03-04 – 2020-03-05 (×2): 30 mg via ORAL
  Filled 2020-03-04 (×2): qty 1

## 2020-03-04 MED ORDER — CLOPIDOGREL BISULFATE 75 MG PO TABS
75.0000 mg | ORAL_TABLET | Freq: Every day | ORAL | Status: DC
  Administered 2020-03-04 – 2020-03-05 (×2): 75 mg via ORAL
  Filled 2020-03-04 (×2): qty 1

## 2020-03-04 MED ORDER — LEVOTHYROXINE SODIUM 50 MCG PO TABS
50.0000 ug | ORAL_TABLET | Freq: Every day | ORAL | Status: DC
  Administered 2020-03-04: 50 ug via ORAL
  Filled 2020-03-04: qty 1

## 2020-03-04 MED ORDER — LORATADINE 10 MG PO TABS
10.0000 mg | ORAL_TABLET | Freq: Every day | ORAL | Status: DC
  Administered 2020-03-04 – 2020-03-05 (×2): 10 mg via ORAL
  Filled 2020-03-04 (×2): qty 1

## 2020-03-04 MED ORDER — POLYETHYLENE GLYCOL 3350 17 G PO PACK: 17.0000 g | PACK | Freq: Every day | ORAL | Status: DC | PRN

## 2020-03-04 MED ORDER — ACETAMINOPHEN 325 MG PO TABS
650.0000 mg | ORAL_TABLET | ORAL | Status: DC | PRN
  Administered 2020-03-04 (×2): 650 mg via ORAL
  Filled 2020-03-04 (×2): qty 2

## 2020-03-04 MED ORDER — ONDANSETRON HCL 4 MG/2ML IJ SOLN
4.0000 mg | Freq: Once | INTRAMUSCULAR | Status: AC
  Administered 2020-03-04: 4 mg via INTRAVENOUS
  Filled 2020-03-04: qty 2

## 2020-03-04 MED ORDER — MORPHINE SULFATE (PF) 2 MG/ML IV SOLN: 2.0000 mg | INTRAVENOUS | Status: DC | PRN

## 2020-03-04 MED ORDER — TAMSULOSIN HCL 0.4 MG PO CAPS
0.4000 mg | ORAL_CAPSULE | Freq: Every day | ORAL | Status: DC
  Administered 2020-03-04 – 2020-03-05 (×2): 0.4 mg via ORAL
  Filled 2020-03-04 (×2): qty 1

## 2020-03-04 NOTE — Plan of Care (Signed)
Patient has been instructed to call nurse for any type of chest discomfort

## 2020-03-04 NOTE — Consult Note (Signed)
[facility] Cardiology  CARDIOLOGY CONSULT NOTE  Patient ID: Brett Fernandez MRN: [id number] DOB/AGE: [birth year] 59 y.o.  Admit date: 03/04/2020 Referring Physician [facility] Primary Physician [facility] Primary Cardiologist [facility] Reason for Consultation chest pain  HPI: 59 year old gentleman referred for evaluation of chest pain.  Patient presents to [facility] ED with left-sided chest pain, 5 out of 10, with associated dyspnea and palpitations.  CT revealed sinus rhythm at a rate of 79 bpm, normal ECG without ischemic ST-T wave changes.  Admission labs notable for normal high-sensitivity troponin (6, 5, 5).  The patient was given 3 sublingual nitroglycerin, IV morphine, with resolution of chest pain.  Patient has undergone previous cardiac work-up including 2D echocardiogram 10/04/2015 which revealed LVEF of 50%.  Lexiscan Myoview 10/04/2015 revealed no evidence for scar or ischemia.  Review of systems complete and found to be negative unless listed above     Past Medical History:  Diagnosis Date  . Allergy   . Anginal pain (HCC)   . Bipolar disorder (HCC)   . BPH (benign prostatic hypertrophy)   . CHF (congestive heart failure) (HCC)   . COPD (chronic obstructive pulmonary disease) (HCC)   . COPD exacerbation (HCC) 11/02/2015  . Coronary artery disease   . Deep vein thrombosis (DVT) (HCC)   . DJD (degenerative joint disease)   . Gastric ulcer   . GERD (gastroesophageal reflux disease)   . History of hiatal hernia   . Hypertension   . Hypothyroidism   . Kidney stones   . Lethargy 11/02/2015  . Neuromuscular disorder (HCC)   . PTSD (post-traumatic stress disorder)     Past Surgical History:  Procedure Laterality Date  . APPENDECTOMY    . colon polyps    . HEMORRHOIDECTOMY WITH HEMORRHOID BANDING    . HERNIA REPAIR    . INNER EAR SURGERY    . JOINT REPLACEMENT Left   . TOTAL KNEE ARTHROPLASTY Right 11/01/2015   Procedure: TOTAL KNEE ARTHROPLASTY;  Surgeon: Juanell Fairly, MD;  Location: [facility]  ORS;  Service: Orthopedics;  Laterality: Right;    (Not in a hospital admission)  Social History   Socioeconomic History  . Marital status: Divorced    Spouse name: Not on file  . Number of children: Not on file  . Years of education: Not on file  . Highest education level: Not on file  Occupational History  . Not on file  Tobacco Use  . Smoking status: Current Every Day Smoker    Packs/day: 1.50    Types: Cigarettes  . Smokeless tobacco: Never Used  . Tobacco comment: cutting down, not ready to quit  Vaping Use  . Vaping Use: Never used  Substance and Sexual Activity  . Alcohol use: No  . Drug use: No  . Sexual activity: Not on file  Other Topics Concern  . Not on file  Social History Narrative  . Not on file   Social Determinants of Health   Financial Resource Strain:   . Difficulty of Paying Living Expenses:   Food Insecurity:   . Worried About Programme researcher, broadcasting/film/video in the Last Year:   . Barista in the Last Year:   Transportation Needs:   . Freight forwarder (Medical):   Marland Kitchen Lack of Transportation (Non-Medical):   Physical Activity:   . Days of Exercise per Week:   . Minutes of Exercise per Session:   Stress:   . Feeling of Stress :   Social Connections:   .  Frequency of Communication with Friends and Family:   . Frequency of Social Gatherings with Friends and Family:   . Attends Religious Services:   . Active Member of Clubs or Organizations:   . Attends Archivist Meetings:   Marland Kitchen Marital Status:   Intimate Partner Violence:   . Fear of Current or Ex-Partner:   . Emotionally Abused:   Marland Kitchen Physically Abused:   . Sexually Abused:     Family History  Problem Relation Age of Onset  . Depression Mother   . Diabetes Mother   . Heart disease Mother   . COPD Mother   . Heart disease Father   . Cancer Father       Review of systems complete and found to be negative unless listed above      PHYSICAL EXAM  General: Well developed,  well nourished, in no acute distress HEENT:  Normocephalic and atramatic Neck:  No JVD.  Lungs: Clear bilaterally to auscultation and percussion. Heart: HRRR . Normal S1 and S2 without gallops or murmurs.  Abdomen: Bowel sounds are positive, abdomen soft and non-tender  Msk:  Back normal, normal gait. Normal strength and tone for age. Extremities: No clubbing, cyanosis or edema.   Neuro: Alert and oriented X 3. Psych:  Good affect, responds appropriately  Labs:   Lab Results  Component Value Date   WBC 12.2 (H) 03/04/2020   HGB 14.8 03/04/2020   HCT 44.9 03/04/2020   MCV 96.8 03/04/2020   PLT 226 03/04/2020    Recent Labs  Lab 03/04/20 0204  NA 138  K 4.0  CL 105  CO2 26  BUN 15  CREATININE 1.37*  CALCIUM 8.9  GLUCOSE 125*   No results found for: CKTOTAL, CKMB, CKMBINDEX, TROPONINI  Lab Results  Component Value Date   CHOL 167 03/04/2020   Lab Results  Component Value Date   HDL 26 (L) 03/04/2020   Lab Results  Component Value Date   LDLCALC UNABLE TO CALCULATE IF TRIGLYCERIDE OVER 400 mg/dL 03/04/2020   Lab Results  Component Value Date   TRIG 412 (H) 03/04/2020   Lab Results  Component Value Date   CHOLHDL 6.4 03/04/2020   No results found for: LDLDIRECT    Radiology: DG Chest 2 View  Result Date: 03/04/2020 CLINICAL DATA:  Chest pain EXAM: CHEST - 2 VIEW COMPARISON:  August 16, 2019 FINDINGS: The heart size and mediastinal contours are within normal limits. Hyperinflation of the upper lung zones is again noted. Aortic knob calcifications. Both lungs are clear. Healing posterior right ninth and tenth rib fractures are seen. IMPRESSION: No active cardiopulmonary disease. Electronically Signed   By: Prudencio Pair M.D.   On: 03/04/2020 02:19   CT ANGIO CHEST PE W OR WO CONTRAST  Result Date: 03/04/2020 CLINICAL DATA:  Short of breath, chest pain EXAM: CT ANGIOGRAPHY CHEST WITH CONTRAST TECHNIQUE: Multidetector CT imaging of the chest was performed using  the standard protocol during bolus administration of intravenous contrast. Multiplanar CT image reconstructions and MIPs were obtained to evaluate the vascular anatomy. CONTRAST:  2mL OMNIPAQUE IOHEXOL 350 MG/ML SOLN COMPARISON:  None. FINDINGS: Cardiovascular: No filling defects within the pulmonary arteries to suggest acute pulmonary embolism. Mediastinum/Nodes: No axillary supraclavicular adenopathy no mediastinal hilar adenopathy no pericardial effusion. Esophagus normal. Lungs/Pleura: Paraseptal emphysema the upper lobes. Centrilobular emphysema in the upper lobes. No pulmonary infarction. There is atelectasis in the lower lobes. Subpleural cystic change in the lower lobes. No suspicious nodularity. Upper Abdomen:  Limited view of the liver, kidneys, pancreas are unremarkable. Normal adrenal glands. Musculoskeletal: No aggressive osseous lesion. Review of the MIP images confirms the above findings. IMPRESSION: 1. No evidence acute pulmonary embolism. 2. Interstitial lung disease with paraseptal emphysema, centrilobular emphysema, and subpleural cystic change. 1. Electronically Signed   By: Genevive Bi M.D.   On: 03/04/2020 08:10    EKG: Normal sinus rhythm at 79 bpm  ASSESSMENT AND PLAN:   1.  Chest pain, with atypical features, normal high-sensitivity troponin x3 (6, 5 6, 5), normal ECG with previous negative cardiac work-up 2.  Essential hypertension, blood pressure well controlled 3.  COPD  Recommendations  1.  Agree with current therapy 2.  Defer full dose anticoagulation 3.  Lexiscan Myoview in a.m. 4.  Further recommendations pending Lexiscan Myoview results   Signed: Marcina Millard MD,PhD, [facility] 03/04/2020, 12:05 PM

## 2020-03-04 NOTE — H&P (Signed)
[facility] NAME: Brett Fernandez    MR#:  [id number]  DATE OF BIRTH:  09-Jun-1961  DATE OF ADMISSION:  03/04/2020  PRIMARY CARE PHYSICIAN: Lynnell Jude, MD   REQUESTING/REFERRING PHYSICIAN: Lurline Hare, MD  CHIEF COMPLAINT:   Chief Complaint  Patient presents with  . Chest Pain    HISTORY OF PRESENT ILLNESS:  Brett Fernandez  is a 59 y.o. Caucasian male with a known history of COPD, CHF, GERD, hypertension, hypothyroidism and coronary artery disease, who presented to the emergency room with the onset of left-sided chest pain felt as pressure and graded 5/10 in severity with associated diaphoresis, dyspnea and palpitations without nausea or vomiting.  He noticed blood pressure was elevated.  He admitted to radiation of his pain to his left arm and left jaw and neck.  He denied any fever or chills.  No cough or wheezing or hemoptysis.  No leg pain or edema recent travels or surgeries.  Upon presentation to the emergency room, blood pressure was 150/99 with otherwise normal vital signs.  Pulse oximetry later on dropped to 88% on room air and he was placed on O2 at 2 L/min labs revealed creatinine of 1.37 with otherwise normal BMP.  High-sensitivity troponin I was 6 and later 5.  CBC showed leukocytosis of 12.2.  INR was 2.8 and PT 28.3.  COVID-19 PCR came back negative.  Two-view chest x-ray showed no acute cardiopulmonary disease. EKG showed normal sinus rhythm with rate of 79.  The patient was given 3 sublingual nitroglycerin at home with partial relief of her pain.  He received 4 mg of IV morphine sulfate here and 4 mg IV Zofran and was fairly comfortable during my interview.  He will be admitted to an observation progressive unit bed for further evaluation and management.  PAST MEDICAL HISTORY:   Past Medical History:  Diagnosis Date  . Allergy   . Anginal pain ([place])   . Bipolar disorder ([place])   . BPH (benign prostatic hypertrophy)   . CHF  (congestive heart failure) ([place])   . COPD (chronic obstructive pulmonary disease) ([place])   . COPD exacerbation ([place]) 11/02/2015  . Coronary artery disease   . Deep vein thrombosis (DVT) ([place])   . DJD (degenerative joint disease)   . Gastric ulcer   . GERD (gastroesophageal reflux disease)   . History of hiatal hernia   . Hypertension   . Hypothyroidism   . Kidney stones   . Lethargy 11/02/2015  . Neuromuscular disorder ([place])   . PTSD (post-traumatic stress disorder)     PAST SURGICAL HISTORY:   Past Surgical History:  Procedure Laterality Date  . APPENDECTOMY    . colon polyps    . HEMORRHOIDECTOMY WITH HEMORRHOID BANDING    . HERNIA REPAIR    . INNER EAR SURGERY    . JOINT REPLACEMENT Left   . TOTAL KNEE ARTHROPLASTY Right 11/01/2015   Procedure: TOTAL KNEE ARTHROPLASTY;  Surgeon: Thornton Park, MD;  Location: [facility] ORS;  Service: Orthopedics;  Laterality: Right;    SOCIAL HISTORY:   Social History   Tobacco Use  . Smoking status: Current Every Day Smoker    Packs/day: 1.50    Types: Cigarettes  . Smokeless tobacco: Never Used  . Tobacco comment: cutting down, not ready to quit  Substance Use Topics  . Alcohol use: No    FAMILY HISTORY:   Family History  Problem Relation Age of Onset  .  Depression Mother   . Diabetes Mother   . Heart disease Mother   . COPD Mother   . Heart disease Father   . Cancer Father     DRUG ALLERGIES:   Allergies  Allergen Reactions  . Aspirin Other (See Comments)    Other Reaction: steven-johnson reaction [name]  . Bee Venom Anaphylaxis  . Sulfa Antibiotics Swelling    Other reaction(s): Unknown    REVIEW OF SYSTEMS:   ROS As per history of present illness. All pertinent systems were reviewed above. Constitutional,  HEENT, cardiovascular, respiratory, GI, GU, musculoskeletal, neuro, psychiatric, endocrine,  integumentary and hematologic systems were reviewed and are otherwise  negative/unremarkable  except for positive findings mentioned above in the HPI.   MEDICATIONS AT HOME:   Prior to Admission medications   Medication Sig Start Date End Date Taking? Authorizing Provider  acetaminophen (TYLENOL) 325 MG tablet Take 650 mg by mouth every 6 (six) hours as needed.   Yes [provider]  atomoxetine (STRATTERA) 40 MG capsule Take 80 mg by mouth daily.   Yes [provider]  DULoxetine (CYMBALTA) 60 MG capsule Take 60 mg by mouth 2 (two) times daily.    Yes [provider]  finasteride (PROSCAR) 5 MG tablet Take 5 mg by mouth daily.    Yes [provider]  fluticasone (FLONASE) 50 MCG/ACT nasal spray Place 1 spray into both nostrils in the morning and at bedtime.    Yes [provider]  Ipratropium-Albuterol (COMBIVENT) 20-100 MCG/ACT AERS respimat Inhale 2 puffs into the lungs every 6 (six) hours. 09/12/19  Yes Wieting, Richard, MD  isosorbide mononitrate (IMDUR) 30 MG 24 hr tablet Take 1 tablet (30 mg total) by mouth daily. 09/12/19 04/25/23 Yes Wieting, Richard, MD  levothyroxine (SYNTHROID, LEVOTHROID) 50 MCG tablet Take 50 mcg by mouth daily before breakfast.    Yes [provider]  loratadine (CLARITIN) 10 MG tablet Take 10 mg by mouth daily.    Yes [provider]  metoprolol succinate (TOPROL-XL) 50 MG 24 hr tablet Take 50 mg by mouth daily.    Yes [provider]  mirtazapine (REMERON) 15 MG tablet Take 15 mg by mouth daily.   Yes [provider]  mometasone-formoterol (DULERA) 100-5 MCG/ACT AERO Inhale 2 puffs into the lungs 2 (two) times daily. 09/12/19  Yes Wieting, Richard, MD  nitroGLYCERIN (NITROSTAT) 0.4 MG SL tablet Place 0.4 mg under the tongue every 5 (five) minutes as needed for chest pain.    Yes [provider]  omeprazole (PRILOSEC) 40 MG capsule Take 40 mg by mouth daily.    Yes [provider]  ondansetron (ZOFRAN) 4 MG tablet Take 1 tablet (4 mg total) by mouth every 6  (six) hours as needed for nausea. 09/12/19  Yes Wieting, Richard, MD  polyethylene glycol (MIRALAX / GLYCOLAX) 17 g packet Take 17 g by mouth daily as needed. 09/12/19  Yes Wieting, Richard, MD  potassium citrate (UROCIT-K) 10 MEQ (1080 MG) SR tablet Take 20 mEq by mouth 2 (two) times daily.    Yes [provider]  pravastatin (PRAVACHOL) 80 MG tablet Take 80 mg by mouth at bedtime.    Yes [provider]  pregabalin (LYRICA) 75 MG capsule Take 4 capsules (300 mg total) by mouth 2 (two) times daily. 09/12/19 03/04/20 Yes Wieting, Richard, MD  tamsulosin (FLOMAX) 0.4 MG CAPS capsule Take 0.4 mg by mouth daily.    Yes [provider]  traZODone (DESYREL)  100 MG tablet Take 300 mg by mouth at bedtime.    Yes [provider]  warfarin (COUMADIN) 4 MG tablet Take 4 mg by mouth every evening.    Yes [provider]  ziprasidone (GEODON) 60 MG capsule Take 60 mg by mouth 2 (two) times daily with a meal.    Yes [provider]  oxyCODONE (OXY IR/ROXICODONE) 5 MG immediate release tablet Take 1 tablet (5 mg total) by mouth every 6 (six) hours as needed for severe pain. 01/08/17 02/07/17  Delano Metz, MD  oxyCODONE (OXY IR/ROXICODONE) 5 MG immediate release tablet Take 1 tablet (5 mg total) by mouth 4 (four) times daily. Max: 4/day 02/07/17 02/14/17  Delano Metz, MD  oxyCODONE (OXY IR/ROXICODONE) 5 MG immediate release tablet Take 1 tablet (5 mg total) by mouth 3 (three) times daily. Max: 3/day 02/14/17 02/21/17  Delano Metz, MD  oxyCODONE (OXY IR/ROXICODONE) 5 MG immediate release tablet Take 1 tablet (5 mg total) by mouth 2 (two) times daily. Max: 2/day 02/21/17 02/28/17  Delano Metz, MD  oxyCODONE (OXY IR/ROXICODONE) 5 MG immediate release tablet Take 1 tablet (5 mg total) by mouth daily. Max: 1/day 02/28/17 03/07/17  Delano Metz, MD      VITAL SIGNS:  Blood pressure (!) 136/95, pulse 75, temperature 98 F (36.7 C), temperature  source Oral, resp. rate 15, height 5\' 7"  (1.702 m), weight 90.7 kg, SpO2 94 %.  PHYSICAL EXAMINATION:  Physical Exam  GENERAL:  59 y.o.-year-old Caucasian male patient lying in the bed with no acute distress.  EYES: Pupils equal, round, reactive to light and accommodation. No scleral icterus. Extraocular muscles intact.  HEENT: Head atraumatic, normocephalic. Oropharynx and nasopharynx clear.  NECK:  Supple, no jugular venous distention. No thyroid enlargement, no tenderness.  LUNGS: Normal breath sounds bilaterally, no wheezing, rales,rhonchi or crepitation. No use of accessory muscles of respiration.  CARDIOVASCULAR: Regular rate and rhythm, S1, S2 normal. No murmurs, rubs, or gallops.  ABDOMEN: Soft, nondistended, nontender. Bowel sounds present. No organomegaly or mass.  EXTREMITIES: No pedal edema, cyanosis, or clubbing.  NEUROLOGIC: Cranial nerves II through XII are intact. Muscle strength 5/5 in all extremities. Sensation intact. Gait not checked.  PSYCHIATRIC: The patient is alert and oriented x 3.  Normal affect and good eye contact. SKIN: No obvious rash, lesion, or ulcer.   LABORATORY PANEL:   CBC Recent Labs  Lab 03/04/20 0204  WBC 12.2*  HGB 14.8  HCT 44.9  PLT 226   ------------------------------------------------------------------------------------------------------------------  Chemistries  Recent Labs  Lab 03/04/20 0204  NA 138  K 4.0  CL 105  CO2 26  GLUCOSE 125*  BUN 15  CREATININE 1.37*  CALCIUM 8.9   ------------------------------------------------------------------------------------------------------------------  Cardiac Enzymes No results for input(s): TROPONINI in the last 168 hours. ------------------------------------------------------------------------------------------------------------------  RADIOLOGY:  DG Chest 2 View  Result Date: 03/04/2020 CLINICAL DATA:  Chest pain EXAM: CHEST - 2 VIEW COMPARISON:  August 16, 2019 FINDINGS: The  heart size and mediastinal contours are within normal limits. Hyperinflation of the upper lung zones is again noted. Aortic knob calcifications. Both lungs are clear. Healing posterior right ninth and tenth rib fractures are seen. IMPRESSION: No active cardiopulmonary disease. Electronically Signed   By: August 18, 2019 M.D.   On: 03/04/2020 02:19      IMPRESSION AND PLAN:   1.  Chest pain, rule out acute coronary syndrome with history of coronary artery disease. -The patient will be admitted to a progressive cardiac unit bed. -We will follow  serial troponin I's. -The patient will be placed on as needed IV morphine sulfate and sublingual nitroglycerin for pain as well as aspirin.  Imdur will be continued as well as Toprol-XL. -Cardiology consultation will be obtained for further cardiac risk stratification. -I notified Dr. Darrold Junker about the patient.  2.  Hypoxemia. -We will obtain a chest CTA to rule out PE. -O2 protocol will be followed.  3.  Hypothyroidism. -We will continue Synthroid and check TSH level.  4.  COPD. -We will continue Combivent and hold off Dulera.  5.  Dyslipidemia. -We will continue statin therapy.  6.  DVT prophylaxis with history of DVT. -We will continue p.o. Coumadin and follow INR.  All the records are reviewed and case discussed with ED provider. The plan of care was discussed in details with the patient (and family). I answered all questions. The patient agreed to proceed with the above mentioned plan. Further management will depend upon hospital course.   CODE STATUS: Full code  Status is: Observation  The patient remains OBS appropriate and will d/c before 2 midnights.  Dispo: The patient is from: Home              Anticipated d/c is to: Home              Anticipated d/c date is: 1 day              Patient currently is not medically stable to d/c.   TOTAL TIME TAKING CARE OF THIS PATIENT: 55 minutes.    Hannah Beat M.D on 03/04/2020 at 4:21  AM  [facility]   From 7 PM-7 AM, contact night-coverage [email or web address]  CC: Primary care physician; Dortha Kern, MD   Note: This dictation was prepared with Dragon dictation along with smaller phrase technology. Any transcriptional typo errors that result from this process are unintentional.

## 2020-03-04 NOTE — Progress Notes (Signed)
ANTICOAGULATION CONSULT NOTE  Pharmacy Consult for warfarin Indication: VTE treatment  Allergies  Allergen Reactions  . Aspirin Other (See Comments)    Other Reaction: steven-johnson reaction [name]  . Bee Venom Anaphylaxis  . Sulfa Antibiotics Swelling    Other reaction(s): Unknown    Patient Measurements: Height: 5\' 7"  (170.2 cm) Weight: 90.7 kg (200 lb) IBW/kg (Calculated) : 66.1  Vital Signs: Temp: 98 F (36.7 C) (06/20 0157) Temp Source: Oral (06/20 0157) BP: 126/90 (06/20 0650) Pulse Rate: 69 (06/20 0650)  Labs: Recent Labs    03/04/20 0204 03/04/20 0234 03/04/20 0356 03/04/20 0519  HGB 14.8  --   --   --   HCT 44.9  --   --   --   PLT 226  --   --   --   LABPROT  --  28.3*  --   --   INR  --  2.8*  --   --   CREATININE 1.37*  --   --   --   TROPONINIHS 6  --  5 5    Estimated Creatinine Clearance: 62.3 mL/min (A) (by C-G formula based on SCr of 1.37 mg/dL (H)).   Medical History: Past Medical History:  Diagnosis Date  . Allergy   . Anginal pain ([facility])   . Bipolar disorder ([facility])   . BPH (benign prostatic hypertrophy)   . CHF (congestive heart failure) ([facility])   . COPD (chronic obstructive pulmonary disease) ([facility])   . COPD exacerbation ([facility]) 11/02/2015  . Coronary artery disease   . Deep vein thrombosis (DVT) ([facility])   . DJD (degenerative joint disease)   . Gastric ulcer   . GERD (gastroesophageal reflux disease)   . History of hiatal hernia   . Hypertension   . Hypothyroidism   . Kidney stones   . Lethargy 11/02/2015  . Neuromuscular disorder ([facility])   . PTSD (post-traumatic stress disorder)     Assessment: 59 year old male on warfarin PTA for VTE treatment. Home dose 4 mg daily. Pharmacy consulted to dose warfarin.  Date INR Dose 6/20 2.8  Goal of Therapy:  INR 2-3 Monitor platelets by anticoagulation protocol: Yes   Plan:  INR therapeutic. Will continue with home dose of warfarin 4 mg daily for now. INR with morning  labs.  7/20, PharmD 03/04/2020,7:35 AM
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
## 2020-03-04 NOTE — ED Notes (Signed)
Attempted to call report and the nurse states she will return the call

## 2020-03-04 NOTE — ED Notes (Signed)
Patient transported to CT 

## 2020-03-04 NOTE — ED Notes (Signed)
Per report pt is admission hold

## 2020-03-04 NOTE — Progress Notes (Signed)
[place] [facility] [place] NAME: Brett Fernandez    MR#:  [id number]  DATE OF BIRTH:  1960-11-22  SUBJECTIVE:  patient came in with chest pain radiating to the left arm. Relieved with nitroglycerin. Continues to smoke heavy. Some shortness of breath earlier now resolved.   REVIEW OF SYSTEMS:   Review of Systems  Constitutional: Negative for chills, fever and weight loss.  HENT: Negative for ear discharge, ear pain and nosebleeds.   Eyes: Negative for blurred vision, pain and discharge.  Respiratory: Negative for sputum production, shortness of breath, wheezing and stridor.   Cardiovascular: Positive for chest pain. Negative for palpitations, orthopnea and PND.  Gastrointestinal: Negative for abdominal pain, diarrhea, nausea and vomiting.  Genitourinary: Negative for frequency and urgency.  Musculoskeletal: Negative for back pain and joint pain.  Neurological: Positive for weakness. Negative for sensory change, speech change and focal weakness.  Psychiatric/Behavioral: Negative for depression and hallucinations. The patient is not nervous/anxious.    Tolerating Diet:yes Tolerating PT:   DRUG ALLERGIES:   Allergies  Allergen Reactions  . Aspirin Other (See Comments)    Other Reaction: steven-johnson reaction [name] Syndrome  . Bee Venom Anaphylaxis  . Sulfa Antibiotics Swelling    Other reaction(s): Unknown    VITALS:  Blood pressure (!) 135/98, pulse 64, temperature 98 F (36.7 C), temperature source Oral, resp. rate 17, height 5\' 7"  (1.702 m), weight 90.7 kg, SpO2 95 %.  PHYSICAL EXAMINATION:   Physical Exam  GENERAL:  59 y.o.-year-old patient lying in the bed with no acute distress.  EYES: Pupils equal, round, reactive to light and accommodation. No scleral icterus.   HEENT: Head atraumatic, normocephalic. Oropharynx and nasopharynx clear.  NECK:  Supple, no jugular venous distention. No thyroid enlargement, no  tenderness.  LUNGS: Normal breath sounds bilaterally, no wheezing, rales, rhonchi. No use of accessory muscles of respiration.  CARDIOVASCULAR: S1, S2 normal. No murmurs, rubs, or gallops.  ABDOMEN: Soft, nontender, nondistended. Bowel sounds present. No organomegaly or mass.  EXTREMITIES: No cyanosis, clubbing or edema b/l.    NEUROLOGIC: Cranial nerves II through XII are intact. No focal Motor or sensory deficits b/l.   PSYCHIATRIC:  patient is alert and oriented x 3.  SKIN: No obvious rash, lesion, or ulcer.   LABORATORY PANEL:  CBC Recent Labs  Lab 03/04/20 0204  WBC 12.2*  HGB 14.8  HCT 44.9  PLT 226    Chemistries  Recent Labs  Lab 03/04/20 0204  NA 138  K 4.0  CL 105  CO2 26  GLUCOSE 125*  BUN 15  CREATININE 1.37*  CALCIUM 8.9   Cardiac Enzymes No results for input(s): TROPONINI in the last 168 hours. RADIOLOGY:  DG Chest 2 View  Result Date: 03/04/2020 CLINICAL DATA:  Chest pain EXAM: CHEST - 2 VIEW COMPARISON:  August 16, 2019 FINDINGS: The heart size and mediastinal contours are within normal limits. Hyperinflation of the upper lung zones is again noted. Aortic knob calcifications. Both lungs are clear. Healing posterior right ninth and tenth rib fractures are seen. IMPRESSION: No active cardiopulmonary disease. Electronically Signed   By: Prudencio Pair M.D.   On: 03/04/2020 02:19   CT ANGIO CHEST PE W OR WO CONTRAST  Result Date: 03/04/2020 CLINICAL DATA:  Short of breath, chest pain EXAM: CT ANGIOGRAPHY CHEST WITH CONTRAST TECHNIQUE: Multidetector CT imaging of the chest was performed using the standard protocol during bolus administration of intravenous contrast. Multiplanar CT image reconstructions  and MIPs were obtained to evaluate the vascular anatomy. CONTRAST:  67mL OMNIPAQUE IOHEXOL 350 MG/ML SOLN COMPARISON:  None. FINDINGS: Cardiovascular: No filling defects within the pulmonary arteries to suggest acute pulmonary embolism. Mediastinum/Nodes: No axillary  supraclavicular adenopathy no mediastinal hilar adenopathy no pericardial effusion. Esophagus normal. Lungs/Pleura: Paraseptal emphysema the upper lobes. Centrilobular emphysema in the upper lobes. No pulmonary infarction. There is atelectasis in the lower lobes. Subpleural cystic change in the lower lobes. No suspicious nodularity. Upper Abdomen: Limited view of the liver, kidneys, pancreas are unremarkable. Normal adrenal glands. Musculoskeletal: No aggressive osseous lesion. Review of the MIP images confirms the above findings. IMPRESSION: 1. No evidence acute pulmonary embolism. 2. Interstitial lung disease with paraseptal emphysema, centrilobular emphysema, and subpleural cystic change. 1. Electronically Signed   By: Genevive Bi M.D.   On: 03/04/2020 08:10   ASSESSMENT AND PLAN:  Brett Fernandez  is a 59 y.o. Caucasian male with a known history of COPD, CHF, GERD, hypertension, hypothyroidism and coronary artery disease, who presented to the emergency room with the onset of left-sided chest pain felt as pressure and graded 5/10 in severity with associated diaphoresis, dyspnea and palpitations without nausea or vomiting.  He noticed blood pressure was elevated.  He admitted to radiation of his pain to his left arm and left jaw and neck   1. Chest pain, rule out acute coronary syndrome with history of coronary artery disease. - serial troponin I's are flat, no acute EKG changes -The patient will be placed on as needed IV morphine sulfate and sublingual nitroglycerin for pain  -  Imdur will be continued as well as Toprol-XL. -allergic to asa -Cardiology consultation  With Dr Linde Gillis in am  2.  Hypoxemia suspected due to COPD/emphysema with ongoing heavy tobacco abuse -CTA to ruled out PE. -O2 protocol will be followed. -wean to RA  3.  Hypothyroidism. - continue Synthroid  4.  COPD with tobacco abuse -continue Combivent and hold off Dulera.  5.  Dyslipidemia. -continue  statin therapy.  6.  DVT prophylaxis with history of DVT. - continue p.o. Coumadin and follow INR.   CODE STATUS: Full code  Status is: Observation  The patient remains OBS appropriate and will d/c before 2 midnights.  Dispo: The patient is from: Home  Anticipated d/c is to: Home  Anticipated d/c date is: 1 day  Patient currently is not medically stable to d/c. Myoview in am  TOTAL TIME TAKING CARE OF THIS PATIENT: *25* minutes.  >50% time spent on counselling and coordination of care  Note: This dictation was prepared with Dragon dictation along with smaller phrase technology. Any transcriptional errors that result from this process are unintentional.  Enedina Finner M.D    [facility]   CC: Primary care physician; Dortha Kern, MDPatient ID: Brett Fernandez, male   DOB: [birth year], 59 y.o.   MRN: [id number]

## 2020-03-04 NOTE — ED Notes (Signed)
Admitting provider notified of episode of left sided chest pain that radiates into left arm and left jaw - pain was relieved with 2 sl ntg

## 2020-03-04 NOTE — ED Notes (Signed)
Pt O2 sat decreased to 88%, pt reported feeling SOB. This RN placed pt on 2L O2, admitting MD aware.

## 2020-03-04 NOTE — ED Notes (Signed)
Dr Allena Katz request that heart healthy diet order be placed

## 2020-03-04 NOTE — ED Notes (Signed)
Pt given lunch tray.

## 2020-03-04 NOTE — Progress Notes (Signed)
Brett Fernandez ( best friend) [phone]

## 2020-03-04 NOTE — ED Triage Notes (Signed)
Patient reports chest pain for the past 1-2 hours.  Reports was feeling short of breath then noticed the pain with radiation in to arm and up jaw.  Reports took own ntg x 2 with no relief but 3 x helped ease pain some.

## 2020-03-05 ENCOUNTER — Observation Stay: Payer: Medicaid Other

## 2020-03-05 DIAGNOSIS — R079 Chest pain, unspecified: Secondary | ICD-10-CM

## 2020-03-05 DIAGNOSIS — Z720 Tobacco use: Secondary | ICD-10-CM | POA: Diagnosis not present

## 2020-03-05 DIAGNOSIS — I10 Essential (primary) hypertension: Secondary | ICD-10-CM | POA: Diagnosis not present

## 2020-03-05 LAB — NM MYOCAR MULTI W/SPECT W/WALL MOTION / EF
Estimated workload: 1 METS
Exercise duration (min): 1 min
Exercise duration (sec): 0 s
LV dias vol: 78 mL (ref 62–150)
LV sys vol: 22 mL
Peak HR: 93 {beats}/min
Rest HR: 78 {beats}/min
SDS: 1
SRS: 0
SSS: 1
TID: 1.17

## 2020-03-05 LAB — HIV ANTIBODY (ROUTINE TESTING W REFLEX): HIV Screen 4th Generation wRfx: NONREACTIVE

## 2020-03-05 LAB — TROPONIN I (HIGH SENSITIVITY): Troponin I (High Sensitivity): 5 ng/L (ref <18)

## 2020-03-05 LAB — PROTIME-INR
INR: 3 — ABNORMAL HIGH (ref 0.8–1.2)
Prothrombin Time: 29.9 seconds — ABNORMAL HIGH (ref 11.4–15.2)

## 2020-03-05 MED ORDER — TECHNETIUM TC 99M TETROFOSMIN IV KIT
10.0000 | PACK | Freq: Once | INTRAVENOUS | Status: AC | PRN
  Administered 2020-03-05: 10.952 via INTRAVENOUS

## 2020-03-05 MED ORDER — NITROGLYCERIN 0.4 MG SL SUBL: 0.4000 mg | SUBLINGUAL_TABLET | SUBLINGUAL | 2 refills | Status: AC | PRN

## 2020-03-05 MED ORDER — REGADENOSON 0.4 MG/5ML IV SOLN
0.4000 mg | Freq: Once | INTRAVENOUS | Status: AC
  Administered 2020-03-05: 0.4 mg via INTRAVENOUS

## 2020-03-05 MED ORDER — ACETAMINOPHEN 325 MG PO TABS: 650.0000 mg | ORAL_TABLET | ORAL | Status: DC | PRN

## 2020-03-05 MED ORDER — TECHNETIUM TC 99M TETROFOSMIN IV KIT
30.0000 | PACK | Freq: Once | INTRAVENOUS | Status: AC | PRN
  Administered 2020-03-05: 31.512 via INTRAVENOUS

## 2020-03-05 MED ORDER — WARFARIN SODIUM 2 MG PO TABS
2.0000 mg | ORAL_TABLET | Freq: Once | ORAL | Status: DC
  Filled 2020-03-05: qty 1

## 2020-03-05 MED ORDER — ATORVASTATIN CALCIUM 80 MG PO TABS: 80.0000 mg | ORAL_TABLET | Freq: Every day | ORAL | Status: DC

## 2020-03-05 MED ORDER — NICOTINE 21 MG/24HR TD PT24: 21.0000 mg | MEDICATED_PATCH | Freq: Every day | TRANSDERMAL | 0 refills | Status: DC

## 2020-03-05 NOTE — Plan of Care (Signed)
  Problem: Education: Goal: Knowledge of General Education information will improve Description: Including pain rating scale, medication(s)/side effects and non-pharmacologic comfort measures Outcome: Completed/Met   Problem: Health Behavior/Discharge Planning: Goal: Ability to manage health-related needs will improve Outcome: Completed/Met   Problem: Clinical Measurements: Goal: Ability to maintain clinical measurements within normal limits will improve Outcome: Completed/Met Goal: Will remain free from infection Outcome: Completed/Met Goal: Diagnostic test results will improve Outcome: Completed/Met Goal: Respiratory complications will improve Outcome: Completed/Met Goal: Cardiovascular complication will be avoided Outcome: Completed/Met   Problem: Activity: Goal: Risk for activity intolerance will decrease Outcome: Completed/Met   Problem: Nutrition: Goal: Adequate nutrition will be maintained Outcome: Completed/Met   Problem: Coping: Goal: Level of anxiety will decrease Outcome: Completed/Met   Problem: Elimination: Goal: Will not experience complications related to bowel motility Outcome: Completed/Met Goal: Will not experience complications related to urinary retention Outcome: Completed/Met   Problem: Pain Managment: Goal: General experience of comfort will improve Outcome: Completed/Met   Problem: Safety: Goal: Ability to remain free from injury will improve Outcome: Completed/Met   Problem: Skin Integrity: Goal: Risk for impaired skin integrity will decrease Outcome: Completed/Met   Problem: Education: Goal: Knowledge of General Education information will improve Description: Including pain rating scale, medication(s)/side effects and non-pharmacologic comfort measures Outcome: Completed/Met   Problem: Health Behavior/Discharge Planning: Goal: Ability to manage health-related needs will improve Outcome: Completed/Met   Problem: Clinical  Measurements: Goal: Ability to maintain clinical measurements within normal limits will improve Outcome: Completed/Met Goal: Will remain free from infection Outcome: Completed/Met Goal: Diagnostic test results will improve Outcome: Completed/Met Goal: Respiratory complications will improve Outcome: Completed/Met Goal: Cardiovascular complication will be avoided Outcome: Completed/Met   Problem: Activity: Goal: Risk for activity intolerance will decrease Outcome: Completed/Met   Problem: Nutrition: Goal: Adequate nutrition will be maintained Outcome: Completed/Met   Problem: Coping: Goal: Level of anxiety will decrease Outcome: Completed/Met   Problem: Elimination: Goal: Will not experience complications related to bowel motility Outcome: Completed/Met Goal: Will not experience complications related to urinary retention Outcome: Completed/Met   Problem: Pain Managment: Goal: General experience of comfort will improve Outcome: Completed/Met   Problem: Safety: Goal: Ability to remain free from injury will improve Outcome: Completed/Met   Problem: Skin Integrity: Goal: Risk for impaired skin integrity will decrease Outcome: Completed/Met  If you wish to quit smoking, help is available.  For free tobacco cessation program offerings call the [facility] at [phone] or Live Well Line at [phone]. You may also visit [email or web address] or email [email or web address]  for more information on other programs.

## 2020-03-05 NOTE — Discharge Instructions (Signed)
Angina ° °Angina is very bad discomfort or pain in the chest, neck, arm, jaw, or back. The discomfort is caused by a lack of blood in the middle layer of the heart wall (myocardium). °What are the causes? °This condition is caused by a buildup of fat and cholesterol (plaque) in your arteries (atherosclerosis). This buildup narrows the arteries and makes it hard for blood to flow. °What increases the risk? °You are more likely to develop this condition if: °· You have high levels of cholesterol in your blood. °· You have high blood pressure (hypertension). °· You have diabetes. °· You have a family history of heart disease. °· You are not active, or you do not exercise enough. °· You feel sad (depressed). °· You have been treated with high energy rays (radiation) on the left side of your chest. °Other risk factors are: °· Using tobacco. °· Being very overweight (obese). °· Eating a diet high in unhealthy fats (saturated fats). °· Having stress, or being exposed to things that cause stress. °· Using drugs, such as cocaine. °Women have a greater risk for angina if: °· They are older than 55. °· They have stopped having their period (are in postmenopause). °What are the signs or symptoms? °Common symptoms of this condition in both men and women may include: °· Chest pain, which may: °? Feel like a crushing or squeezing in the chest. °? Feel like a tightness, pressure, fullness, or heaviness in the chest. °? Last for more than a few minutes at a time. °? Stop and come back (recur) after a few minutes. °· Pain in the neck, arm, jaw, or back. °· Heartburn or upset stomach (indigestion) for no reason. °· Being short of breath. °· Feeling sick to your stomach (nauseous). °· Sudden cold sweats. °Women and people with diabetes may have other symptoms that are not usual, such as feeling: °· Tired (fatigue). °· Worried or nervous (anxious) for no reason. °· Weak for no reason. °· Dizzy or passing out (fainting). °How is this  treated? °This condition may be treated with: °· Medicines. These are given to: °? Prevent blood clots. °? Prevent heart attack. °? Relax blood vessels and improve blood flow to the heart (nitrates). °? Reduce blood pressure. °? Improve the pumping action of the heart. °? Reduce fat and cholesterol in the blood. °· A procedure to widen a narrowed or blocked artery in the heart (angioplasty). °· Surgery to allow blood to go around a blocked artery (coronary artery bypass surgery). °Follow these instructions at home: °Medicines °· Take over-the-counter and prescription medicines only as told by your doctor. °· Do not take these medicines unless your doctor says that you can: °? NSAIDs. These include: °§ Ibuprofen. °§ Naproxen. °? Vitamin supplements that have vitamin A, vitamin E, or both. °? Hormone therapy that contains estrogen with or without progestin. °Eating and drinking ° °· Eat a heart-healthy diet that includes: °? Lots of fresh fruits and vegetables. °? Whole grains. °? Low-fat (lean) protein. °? Low-fat dairy products. °· Follow instructions from your doctor about what you cannot eat or drink. °Activity °· Follow an exercise program that your doctor tells you. °· Talk with your doctor about joining a program to help improve the health of your heart (cardiac rehab). °· When you feel tired, take a break. Plan breaks if you know you are going to feel tired. °Lifestyle ° °· Do not use any products that contain nicotine or tobacco. This includes cigarettes, e-cigarettes, and   and chewing tobacco. If you need help quitting, ask your doctor.  If your doctor says you can drink alcohol: ? Limit how much you use to:  0-1 drink a day for women who are not pregnant.  0-2 drinks a day for men. ? Be aware of how much alcohol is in your drink. In the [place] one drink equals:  One 12 oz bottle of beer (355 mL).  One 5 oz glass of wine (148 mL).  One 1 oz glass of hard liquor (44 mL). General instructions  Stay  at a healthy weight. If your doctor tells you to do so, work with him or her to lose weight.  Learn to deal with stress. If you need help, ask your doctor.  Keep your vaccines up to date. Get a flu shot every year.  Talk with your doctor if you feel sad. Take a screening test to see if you are at risk for depression.  Work with your doctor to manage any other health problems that you have. These may include diabetes or high blood pressure.  Keep all follow-up visits as told by your doctor. This is important. Get help right away if:  You have pain in your chest, neck, arm, jaw, or back, and the pain: ? Lasts more than a few minutes. ? Comes back. ? Does not get better after you take medicine under your tongue (sublingual nitroglycerin). ? Keeps getting worse. ? Comes more often.  You have any of these problems for no reason: ? Sweating a lot. ? Heartburn or upset stomach. ? Shortness of breath. ? Trouble breathing. ? Feeling sick to your stomach. ? Throwing up (vomiting). ? Feeling more tired than normal. ? Feeling nervous or worrying more than normal. ? Weakness.  You are suddenly dizzy or light-headed.  You pass out. These symptoms may be an emergency. Do not wait to see if the symptoms will go away. Get medical help right away. Call your local emergency services (911 in the [place] Do not drive yourself to the hospital. Summary  Angina is very bad discomfort or pain in the chest, neck, arm, neck, or back.  Symptoms include chest pain, heartburn or upset stomach for no reason, and shortness of breath.  Women or people with diabetes may have symptoms that are not usual, such as feeling nervous or worried for no reason, weak for no reason, or tired.  Take all medicines only as told by your doctor.  You should eat a heart-healthy diet and follow an exercise program. This information is not intended to replace advice given to you by your health care provider. Make sure you  discuss any questions you have with your health care provider. Document Revised: 04/19/2018 Document Reviewed: 04/19/2018 Elsevier Patient Education  2020 [employer]. Patient advised smoking cessation!!

## 2020-03-05 NOTE — Progress Notes (Signed)
Patient returned from Stress test.

## 2020-03-05 NOTE — Discharge Summary (Signed)
[facility] [place] [facility]   PATIENT NAME: Brett Fernandez    MR#:  [id number]  DATE OF BIRTH:  29-Mar-1961  DATE OF ADMISSION:  03/04/2020 ADMITTING PHYSICIAN: Hannah Beat, MD  DATE OF DISCHARGE: 03/05/2020  PRIMARY CARE PHYSICIAN: Dortha Kern, MD   ADMISSION DIAGNOSIS:  Unstable angina (HCC) [I20.0] Essential hypertension [I10] Chest pain [R07.9]  DISCHARGE DIAGNOSIS:  chest pain with atypical feature-- ruled out MI with normal high-sensitivity troponin times three abnormal EKG ongoing tobacco abuse  SECONDARY DIAGNOSIS:   Past Medical History:  Diagnosis Date   Allergy    Anginal pain (HCC)    Bipolar disorder (HCC)    BPH (benign prostatic hypertrophy)    CHF (congestive heart failure) (HCC)    COPD (chronic obstructive pulmonary disease) (HCC)    COPD exacerbation (HCC) 11/02/2015   Coronary artery disease    Deep vein thrombosis (DVT) (HCC)    DJD (degenerative joint disease)    Gastric ulcer    GERD (gastroesophageal reflux disease)    History of hiatal hernia    Hypertension    Hypothyroidism    Kidney stones    Lethargy 11/02/2015   Neuromuscular disorder (HCC)    PTSD (post-traumatic stress disorder)     HOSPITAL COURSE:  MatthewEldredis a59 y.o.Caucasian malewith a known history of COPD, CHF, GERD, hypertension, hypothyroidism and coronary artery disease, who presented to the emergency room with the onset of left-sided chest pain felt as pressure and graded 5/10 in severity with associated diaphoresis, dyspnea and palpitations without nausea or vomiting. He noticed blood pressure was elevated. He admitted to radiation of his pain to his left arm and left jaw and neck   1. Chest pain with atypical features- serial high-sensitivity troponin I's are flat, no acute EKG changes -on as neededsublingual nitroglycerin for pain  - Imdur,statins andToprol-XL. -allergic to asa -Cardiology consultation  with Dr  Cassie Freer-- recommends Myoview stress test --per Dr. Charlette Caffey is negative.  2. Hypoxemia suspected due to COPD/emphysema with ongoing heavy tobacco abuse -CTA  ruled out PE. -O2 protocol will be followed. -wean to RA  3. Hypothyroidism. - continue Synthroid  4. COPD with tobacco abuse -continue Combivent and  Dulera.  5. Dyslipidemia. -continue statin therapy.  6.DVT prophylaxis with history of DVT. - continue p.o. Coumadin and follow up  INR therapeutic  Overall hemodynamically stable. Will discharge patient to home. Patient is agreeable with the plan. He is asked to follow-up with PCP and Dr. Juliann Pares as outpatient.  CODE STATUS: Full code  Status is: Observation  The patient remains OBS appropriate and will d/c before 2 midnights.  Dispo: The patient is from:Home Anticipated d/c is ON:GEXB Anticipated d/c date is: today Patient currently is  medically stable to d/c.   CONSULTS OBTAINED:    DRUG ALLERGIES:   Allergies  Allergen Reactions   Aspirin Other (See Comments)    Other Reaction: steven-johnson reaction [name]   Bee Venom Anaphylaxis   Sulfa Antibiotics Swelling    Other reaction(s): Unknown    DISCHARGE MEDICATIONS:   Allergies as of 03/05/2020      Reactions   Aspirin Other (See Comments)   Other Reaction: steven-johnson reaction [name]   Bee Venom Anaphylaxis   Sulfa Antibiotics Swelling   Other reaction(s): Unknown      Medication List    TAKE these medications   acetaminophen 325 MG tablet Commonly known as: TYLENOL Take 650 mg by mouth every 6 (  six) hours as needed.   atomoxetine 40 MG capsule Commonly known as: STRATTERA Take 80 mg by mouth daily.   Dulera 100-5 MCG/ACT Aero Generic drug: mometasone-formoterol Inhale 2 puffs into the lungs 2 (two) times daily.   DULoxetine 60 MG capsule Commonly known as: CYMBALTA Take 60 mg by  mouth 2 (two) times daily.   finasteride 5 MG tablet Commonly known as: PROSCAR Take 5 mg by mouth daily.   fluticasone 50 MCG/ACT nasal spray Commonly known as: FLONASE Place 1 spray into both nostrils in the morning and at bedtime.   Ipratropium-Albuterol 20-100 MCG/ACT Aers respimat Commonly known as: COMBIVENT Inhale 2 puffs into the lungs every 6 (six) hours.   isosorbide mononitrate 30 MG 24 hr tablet Commonly known as: IMDUR Take 1 tablet (30 mg total) by mouth daily.   levothyroxine 50 MCG tablet Commonly known as: SYNTHROID Take 50 mcg by mouth daily before breakfast.   loratadine 10 MG tablet Commonly known as: CLARITIN Take 10 mg by mouth daily.   metoprolol succinate 50 MG 24 hr tablet Commonly known as: TOPROL-XL Take 50 mg by mouth daily.   mirtazapine 15 MG tablet Commonly known as: REMERON Take 15 mg by mouth daily.   nicotine 21 mg/24hr patch Commonly known as: NICODERM CQ - dosed in mg/24 hours Place 1 patch (21 mg total) onto the skin daily. Start taking on: March 06, 2020   nitroGLYCERIN 0.4 MG SL tablet Commonly known as: NITROSTAT Place 1 tablet (0.4 mg total) under the tongue every 5 (five) minutes as needed for chest pain.   omeprazole 40 MG capsule Commonly known as: PRILOSEC Take 40 mg by mouth daily.   ondansetron 4 MG tablet Commonly known as: ZOFRAN Take 1 tablet (4 mg total) by mouth every 6 (six) hours as needed for nausea.   oxyCODONE 5 MG immediate release tablet Commonly known as: Oxy IR/ROXICODONE Take 1 tablet (5 mg total) by mouth every 6 (six) hours as needed for severe pain. What changed: Another medication with the same name was removed. Continue taking this medication, and follow the directions you see here.   polyethylene glycol 17 g packet Commonly known as: MIRALAX / GLYCOLAX Take 17 g by mouth daily as needed.   potassium citrate 10 MEQ (1080 MG) SR tablet Commonly known as: UROCIT-K Take 20 mEq by mouth 2 (two)  times daily.   pravastatin 80 MG tablet Commonly known as: PRAVACHOL Take 80 mg by mouth at bedtime.   pregabalin 75 MG capsule Commonly known as: Lyrica Take 4 capsules (300 mg total) by mouth 2 (two) times daily.   tamsulosin 0.4 MG Caps capsule Commonly known as: FLOMAX Take 0.4 mg by mouth daily.   traZODone 100 MG tablet Commonly known as: DESYREL Take 300 mg by mouth at bedtime.   warfarin 4 MG tablet Commonly known as: COUMADIN Take 4 mg by mouth every evening.   ziprasidone 60 MG capsule Commonly known as: GEODON Take 60 mg by mouth 2 (two) times daily with a meal.       If you experience worsening of your admission symptoms, develop shortness of breath, life threatening emergency, suicidal or homicidal thoughts you must seek medical attention immediately by calling 911 or calling your MD immediately  if symptoms less severe.  You Must read complete instructions/literature along with all the possible adverse reactions/side effects for all the Medicines you take and that have been prescribed to you. Take any new Medicines after you have completely  understood and accept all the possible adverse reactions/side effects.   Please note  You were cared for by a hospitalist during your hospital stay. If you have any questions about your discharge medications or the care you received while you were in the hospital after you are discharged, you can call the unit and asked to speak with the hospitalist on call if the hospitalist that took care of you is not available. Once you are discharged, your primary care physician will handle any further medical issues. Please note that NO REFILLS for any discharge medications will be authorized once you are discharged, as it is imperative that you return to your primary care physician (or establish a relationship with a primary care physician if you do not have one) for your aftercare needs so that they can reassess your need for medications  and monitor your lab values. Today   SUBJECTIVE  Overall better Just got back from stress test  VITAL SIGNS:  Blood pressure (!) 153/95, pulse 74, temperature 98.4 F (36.9 C), temperature source Oral, resp. rate 19, height 5\' 7"  (1.702 m), weight 90.7 kg, SpO2 95 %.  I/O:    Intake/Output Summary (Last 24 hours) at 03/05/2020 1401 Last data filed at 03/05/2020 0704 Gross per 24 hour  Intake 240 ml  Output 1500 ml  Net -1260 ml    PHYSICAL EXAMINATION:  GENERAL:  59 y.o.-year-old patient lying in the bed with no acute distress.  EYES: Pupils equal, round, reactive to light and accommodation. No scleral icterus.  HEENT: Head atraumatic, normocephalic. Oropharynx and nasopharynx clear.  NECK:  Supple, no jugular venous distention. No thyroid enlargement, no tenderness.  LUNGS: Normal breath sounds bilaterally, no wheezing, rales,rhonchi or crepitation. No use of accessory muscles of respiration.  CARDIOVASCULAR: S1, S2 normal. No murmurs, rubs, or gallops.  ABDOMEN: Soft, non-tender, non-distended. Bowel sounds present. No organomegaly or mass.  EXTREMITIES: No pedal edema, cyanosis, or clubbing.  NEUROLOGIC: Cranial nerves II through XII are intact. Muscle strength 5/5 in all extremities. Sensation intact. Gait not checked.  PSYCHIATRIC: The patient is alert and oriented x 3.  SKIN: No obvious rash, lesion, or ulcer.   DATA REVIEW:   CBC  Recent Labs  Lab 03/04/20 0204  WBC 12.2*  HGB 14.8  HCT 44.9  PLT 226    Chemistries  Recent Labs  Lab 03/04/20 0204  NA 138  K 4.0  CL 105  CO2 26  GLUCOSE 125*  BUN 15  CREATININE 1.37*  CALCIUM 8.9    Microbiology Results   Recent Results (from the past 240 hour(s))  SARS Coronavirus 2 by RT PCR (hospital order, performed in [facility] hospital lab) Nasopharyngeal Nasopharyngeal Swab     Status: None   Collection Time: 03/04/20  2:34 AM   Specimen: Nasopharyngeal Swab  Result Value Ref Range Status   SARS  Coronavirus 2 NEGATIVE NEGATIVE Final    Comment: (NOTE) SARS-CoV-2 target nucleic acids are NOT DETECTED.  The SARS-CoV-2 RNA is generally detectable in upper and lower respiratory specimens during the acute phase of infection. The lowest concentration of SARS-CoV-2 viral copies this assay can detect is 250 copies / mL. A negative result does not preclude SARS-CoV-2 infection and should not be used as the sole basis for treatment or other patient management decisions.  A negative result may occur with improper specimen collection / handling, submission of specimen other than nasopharyngeal swab, presence of viral mutation(s) within the areas targeted by this assay, and inadequate  number of viral copies (<250 copies / mL). A negative result must be combined with clinical observations, patient history, and epidemiological information.  Fact Sheet for Patients:   BoilerBrush.com.cy  Fact Sheet for Healthcare Providers: https://pope.com/  This test is not yet approved or  cleared by the [place] FDA and has been authorized for detection and/or diagnosis of SARS-CoV-2 by FDA under an Emergency Use Authorization (EUA).  This EUA will remain in effect (meaning this test can be used) for the duration of the COVID-19 declaration under Section 564(b)(1) of the Act, 21 U.S.C. section 360bbb-3(b)(1), unless the authorization is terminated or revoked sooner.  Performed at [facility], [address] [place], [place] [address]     RADIOLOGY:  DG Chest 2 View  Result Date: 03/04/2020 CLINICAL DATA:  Chest pain EXAM: CHEST - 2 VIEW COMPARISON:  August 16, 2019 FINDINGS: The heart size and mediastinal contours are within normal limits. Hyperinflation of the upper lung zones is again noted. Aortic knob calcifications. Both lungs are clear. Healing posterior right ninth and tenth rib fractures are seen. IMPRESSION: No active  cardiopulmonary disease. Electronically Signed   By: Jonna Clark M.D.   On: 03/04/2020 02:19   CT ANGIO CHEST PE W OR WO CONTRAST  Result Date: 03/04/2020 CLINICAL DATA:  Short of breath, chest pain EXAM: CT ANGIOGRAPHY CHEST WITH CONTRAST TECHNIQUE: Multidetector CT imaging of the chest was performed using the standard protocol during bolus administration of intravenous contrast. Multiplanar CT image reconstructions and MIPs were obtained to evaluate the vascular anatomy. CONTRAST:  31mL OMNIPAQUE IOHEXOL 350 MG/ML SOLN COMPARISON:  None. FINDINGS: Cardiovascular: No filling defects within the pulmonary arteries to suggest acute pulmonary embolism. Mediastinum/Nodes: No axillary supraclavicular adenopathy no mediastinal hilar adenopathy no pericardial effusion. Esophagus normal. Lungs/Pleura: Paraseptal emphysema the upper lobes. Centrilobular emphysema in the upper lobes. No pulmonary infarction. There is atelectasis in the lower lobes. Subpleural cystic change in the lower lobes. No suspicious nodularity. Upper Abdomen: Limited view of the liver, kidneys, pancreas are unremarkable. Normal adrenal glands. Musculoskeletal: No aggressive osseous lesion. Review of the MIP images confirms the above findings. IMPRESSION: 1. No evidence acute pulmonary embolism. 2. Interstitial lung disease with paraseptal emphysema, centrilobular emphysema, and subpleural cystic change. 1. Electronically Signed   By: Genevive Bi M.D.   On: 03/04/2020 08:10   NM Myocar Multi W/Spect W/Wall Motion / EF  Result Date: 03/05/2020  There was no ST segment deviation noted during stress.  No T wave inversion was noted during stress.  The study is normal.  This is a low risk study.  The left ventricular ejection fraction is hyperdynamic (>65%).  Nuclear stress EF: 67%.      CODE STATUS:     Code Status Orders  (From admission, onward)         Start     Ordered   03/04/20 0418  Full code  Continuous         03/04/20 0421        Code Status History    Date Active Date Inactive Code Status Order ID Comments User Context   09/05/2019 2348 09/12/2019 2034 Full Code [id number]  [facility] Vernetta Honey, MD ED   08/23/2018 2133 08/29/2018 1637 Full Code [id number]  SalaryEvelena Asa, MD Inpatient   Advance Care Planning Activity       TOTAL TIME TAKING CARE OF THIS PATIENT: *40* minutes.    Enedina Finner M.D  [facility]    CC:  Primary care physician; Dortha Kern, MD

## 2020-03-05 NOTE — Progress Notes (Signed)
CH visited pt. per OR for AD education/completion; RN in rm. at bedside; pt. lying in bed w/nasal canula, awake and amenable to [facility] visit.  Pt. shared he experienced chest pain Sat. night that did not decrease after taking 'nitro'; pt. called his friend to bring him to ED.  Pt. reports feeling better today, but says he still feels a tightness in his chest.  Pt. lives alone, but close to the friend who brought him to the hospital; 'he's like a brother to me.' Pt. has three children.  RN shared pt. had requested to complete AD because he lost his original copy; pt. also said he wanted to make his friend MPOA instead of one of his children so he would be 'the bad guy' and children wouldn't be burdened.  Pt. requested CH leave form w/him to read over once he gets reading glasses; CH read through living will @ RN request to ensure pt. understands document in case he's discharged.  Pt. requested prayer @ end of visit.  CH is available if needed.    03/05/20 1100  Clinical Encounter Type  Visited With Patient;Health care provider  Visit Type Initial;Psychological support;Spiritual support;Social support (Advanced Care planning)  Referral From Nurse  Consult/Referral To Chaplain  Spiritual Encounters  Spiritual Needs Emotional;Prayer  Stress Factors  Patient Stress Factors Health changes;Major life changes

## 2020-03-05 NOTE — Progress Notes (Signed)
Discussed with patient the effects of smoking and heart disease. Patient receptive to teaching and all questions answered.

## 2020-03-05 NOTE — Progress Notes (Addendum)
ANTICOAGULATION CONSULT NOTE  Pharmacy Consult for warfarin Indication: VTE prophylaxis  Allergies  Allergen Reactions  . Aspirin Other (See Comments)    Other Reaction: steven-johnson reaction [name]  . Bee Venom Anaphylaxis  . Sulfa Antibiotics Swelling    Other reaction(s): Unknown    Patient Measurements: Height: 5\' 7"  (170.2 cm) Weight: 90.7 kg (200 lb) IBW/kg (Calculated) : 66.1  Vital Signs:    Labs: Recent Labs    03/04/20 0204 03/04/20 0204 03/04/20 0234 03/04/20 0356 03/04/20 0519 03/05/20 0702  HGB 14.8  --   --   --   --   --   HCT 44.9  --   --   --   --   --   PLT 226  --   --   --   --   --   LABPROT  --   --  28.3*  --   --  29.9*  INR  --   --  2.8*  --   --  3.0*  CREATININE 1.37*  --   --   --   --   --   TROPONINIHS 6   < >  --  5 5 5    < > = values in this interval not displayed.    Estimated Creatinine Clearance: 62.3 mL/min (A) (by C-G formula based on SCr of 1.37 mg/dL (H)).   Medical History: Past Medical History:  Diagnosis Date  . Allergy   . Anginal pain (HCC)   . Bipolar disorder (HCC)   . BPH (benign prostatic hypertrophy)   . CHF (congestive heart failure) (HCC)   . COPD (chronic obstructive pulmonary disease) (HCC)   . COPD exacerbation (HCC) 11/02/2015  . Coronary artery disease   . Deep vein thrombosis (DVT) (HCC)   . DJD (degenerative joint disease)   . Gastric ulcer   . GERD (gastroesophageal reflux disease)   . History of hiatal hernia   . Hypertension   . Hypothyroidism   . Kidney stones   . Lethargy 11/02/2015  . Neuromuscular disorder (HCC)   . PTSD (post-traumatic stress disorder)     Assessment: 59 year old male on warfarin PTA for hx of DVT. Home dose 4 mg daily. Pharmacy consulted to dose warfarin.  Date INR Dose 6/20 2.8 4 mg 6/21 3.0  Goal of Therapy:  INR 2-3 Monitor platelets by anticoagulation protocol: Yes   Plan:  INR at upper limit of therapeutic range (2.8 > 3).  Predict INR may continue to trend up tomorrow, therefore will order warfarin 2 mg tonight (half of the patient's home dose). Daily INR ordered. CBC should be obtained at least every 3 days.    7/20 PharmD Candidate 2022 03/05/2020 12:03 PM

## 2020-03-21 ENCOUNTER — Other Ambulatory Visit: Payer: Self-pay

## 2020-03-21 ENCOUNTER — Emergency Department: Payer: Medicaid Other

## 2020-03-21 ENCOUNTER — Observation Stay
Admission: EM | Admit: 2020-03-21 | Discharge: 2020-03-22 | Disposition: A | Discharge status: Home / Self Care | Payer: Medicaid Other | Attending: Internal Medicine | Admitting: Internal Medicine

## 2020-03-21 DIAGNOSIS — E039 Hypothyroidism, unspecified: Secondary | ICD-10-CM | POA: Diagnosis not present

## 2020-03-21 DIAGNOSIS — I10 Essential (primary) hypertension: Secondary | ICD-10-CM | POA: Diagnosis present

## 2020-03-21 DIAGNOSIS — T63461A Toxic effect of venom of wasps, accidental (unintentional), initial encounter: Secondary | ICD-10-CM | POA: Diagnosis not present

## 2020-03-21 DIAGNOSIS — J449 Chronic obstructive pulmonary disease, unspecified: Secondary | ICD-10-CM | POA: Diagnosis not present

## 2020-03-21 DIAGNOSIS — Z79899 Other long term (current) drug therapy: Secondary | ICD-10-CM | POA: Insufficient documentation

## 2020-03-21 DIAGNOSIS — Z7901 Long term (current) use of anticoagulants: Secondary | ICD-10-CM | POA: Diagnosis not present

## 2020-03-21 DIAGNOSIS — F3170 Bipolar disorder, currently in remission, most recent episode unspecified: Secondary | ICD-10-CM | POA: Diagnosis present

## 2020-03-21 DIAGNOSIS — M797 Fibromyalgia: Secondary | ICD-10-CM | POA: Insufficient documentation

## 2020-03-21 DIAGNOSIS — F17200 Nicotine dependence, unspecified, uncomplicated: Secondary | ICD-10-CM | POA: Diagnosis present

## 2020-03-21 DIAGNOSIS — E785 Hyperlipidemia, unspecified: Secondary | ICD-10-CM | POA: Diagnosis not present

## 2020-03-21 DIAGNOSIS — J42 Unspecified chronic bronchitis: Secondary | ICD-10-CM | POA: Diagnosis not present

## 2020-03-21 DIAGNOSIS — G894 Chronic pain syndrome: Secondary | ICD-10-CM | POA: Diagnosis not present

## 2020-03-21 DIAGNOSIS — J9601 Acute respiratory failure with hypoxia: Secondary | ICD-10-CM | POA: Diagnosis not present

## 2020-03-21 DIAGNOSIS — Z20822 Contact with and (suspected) exposure to covid-19: Secondary | ICD-10-CM | POA: Insufficient documentation

## 2020-03-21 DIAGNOSIS — Z79891 Long term (current) use of opiate analgesic: Secondary | ICD-10-CM | POA: Diagnosis not present

## 2020-03-21 DIAGNOSIS — Z79890 Hormone replacement therapy (postmenopausal): Secondary | ICD-10-CM | POA: Insufficient documentation

## 2020-03-21 DIAGNOSIS — I2510 Atherosclerotic heart disease of native coronary artery without angina pectoris: Secondary | ICD-10-CM | POA: Diagnosis present

## 2020-03-21 DIAGNOSIS — K219 Gastro-esophageal reflux disease without esophagitis: Secondary | ICD-10-CM | POA: Diagnosis present

## 2020-03-21 DIAGNOSIS — Z91030 Bee allergy status: Secondary | ICD-10-CM

## 2020-03-21 DIAGNOSIS — F32A Depression, unspecified: Secondary | ICD-10-CM | POA: Diagnosis present

## 2020-03-21 DIAGNOSIS — I82409 Acute embolism and thrombosis of unspecified deep veins of unspecified lower extremity: Secondary | ICD-10-CM | POA: Diagnosis present

## 2020-03-21 DIAGNOSIS — F319 Bipolar disorder, unspecified: Secondary | ICD-10-CM | POA: Diagnosis not present

## 2020-03-21 DIAGNOSIS — Y93H2 Activity, gardening and landscaping: Secondary | ICD-10-CM | POA: Diagnosis not present

## 2020-03-21 DIAGNOSIS — T782XXA Anaphylactic shock, unspecified, initial encounter: Secondary | ICD-10-CM | POA: Diagnosis present

## 2020-03-21 DIAGNOSIS — R0602 Shortness of breath: Secondary | ICD-10-CM | POA: Diagnosis present

## 2020-03-21 HISTORY — DX: Depression, unspecified: F32.A

## 2020-03-21 HISTORY — DX: Acute respiratory failure with hypoxia: J96.01

## 2020-03-21 LAB — CBC WITH DIFFERENTIAL/PLATELET
Abs Immature Granulocytes: 0.04 10*3/uL (ref 0.00–0.07)
Basophils Absolute: 0 10*3/uL (ref 0.0–0.1)
Basophils Relative: 0 %
Eosinophils Absolute: 0.2 10*3/uL (ref 0.0–0.5)
Eosinophils Relative: 2 %
HCT: 42.4 % (ref 39.0–52.0)
Hemoglobin: 14.9 g/dL (ref 13.0–17.0)
Immature Granulocytes: 0 %
Lymphocytes Relative: 34 %
Lymphs Abs: 4 10*3/uL (ref 0.7–4.0)
MCH: 32.9 pg (ref 26.0–34.0)
MCHC: 35.1 g/dL (ref 30.0–36.0)
MCV: 93.6 fL (ref 80.0–100.0)
Monocytes Absolute: 0.8 10*3/uL (ref 0.1–1.0)
Monocytes Relative: 7 %
Neutro Abs: 6.7 10*3/uL (ref 1.7–7.7)
Neutrophils Relative %: 57 %
Platelets: 225 10*3/uL (ref 150–400)
RBC: 4.53 MIL/uL (ref 4.22–5.81)
RDW: 13.9 % (ref 11.5–15.5)
WBC: 11.8 10*3/uL — ABNORMAL HIGH (ref 4.0–10.5)
nRBC: 0 % (ref 0.0–0.2)

## 2020-03-21 LAB — COMPREHENSIVE METABOLIC PANEL
ALT: 20 U/L (ref 0–44)
AST: 23 U/L (ref 15–41)
Albumin: 4.1 g/dL (ref 3.5–5.0)
Alkaline Phosphatase: 88 U/L (ref 38–126)
Anion gap: 13 (ref 5–15)
BUN: 13 mg/dL (ref 6–20)
CO2: 25 mmol/L (ref 22–32)
Calcium: 8.9 mg/dL (ref 8.9–10.3)
Chloride: 100 mmol/L (ref 98–111)
Creatinine, Ser: 1.19 mg/dL (ref 0.61–1.24)
GFR calc Af Amer: >60 mL/min (ref >60)
GFR calc non Af Amer: >60 mL/min (ref >60)
Glucose, Bld: 159 mg/dL — ABNORMAL HIGH (ref 70–99)
Potassium: 3.4 mmol/L — ABNORMAL LOW (ref 3.5–5.1)
Sodium: 138 mmol/L (ref 135–145)
Total Bilirubin: 0.7 mg/dL (ref 0.3–1.2)
Total Protein: 7.2 g/dL (ref 6.5–8.1)

## 2020-03-21 LAB — TROPONIN I (HIGH SENSITIVITY)
Troponin I (High Sensitivity): 9 ng/L (ref <18)
Troponin I (High Sensitivity): 21 ng/L — ABNORMAL HIGH (ref <18)

## 2020-03-21 LAB — SARS CORONAVIRUS 2 BY RT PCR (HOSPITAL ORDER, PERFORMED IN ~~LOC~~ HOSPITAL LAB): SARS Coronavirus 2: NEGATIVE

## 2020-03-21 MED ORDER — DULOXETINE HCL 60 MG PO CPEP
60.0000 mg | ORAL_CAPSULE | Freq: Two times a day (BID) | ORAL | Status: DC
  Administered 2020-03-22 (×2): 60 mg via ORAL
  Filled 2020-03-21 (×3): qty 1

## 2020-03-21 MED ORDER — ACETAMINOPHEN 325 MG PO TABS
650.0000 mg | ORAL_TABLET | Freq: Four times a day (QID) | ORAL | Status: DC | PRN
  Administered 2020-03-21 – 2020-03-22 (×2): 650 mg via ORAL
  Filled 2020-03-21 (×2): qty 2

## 2020-03-21 MED ORDER — ENOXAPARIN SODIUM 40 MG/0.4ML ~~LOC~~ SOLN
40.0000 mg | SUBCUTANEOUS | Status: DC
  Administered 2020-03-21: 40 mg via SUBCUTANEOUS
  Filled 2020-03-21: qty 0.4

## 2020-03-21 MED ORDER — FINASTERIDE 5 MG PO TABS
5.0000 mg | ORAL_TABLET | Freq: Every day | ORAL | Status: DC
  Administered 2020-03-21 – 2020-03-22 (×2): 5 mg via ORAL
  Filled 2020-03-21 (×3): qty 1

## 2020-03-21 MED ORDER — METHYLPREDNISOLONE SODIUM SUCC 125 MG IJ SOLR
80.0000 mg | Freq: Two times a day (BID) | INTRAMUSCULAR | Status: DC
  Administered 2020-03-21 – 2020-03-22 (×2): 80 mg via INTRAVENOUS
  Filled 2020-03-21 (×2): qty 2

## 2020-03-21 MED ORDER — FAMOTIDINE IN NACL 20-0.9 MG/50ML-% IV SOLN
20.0000 mg | Freq: Two times a day (BID) | INTRAVENOUS | Status: DC
  Administered 2020-03-21 – 2020-03-22 (×2): 20 mg via INTRAVENOUS
  Filled 2020-03-21 (×2): qty 50

## 2020-03-21 MED ORDER — DIPHENHYDRAMINE HCL 50 MG/ML IJ SOLN
25.0000 mg | Freq: Three times a day (TID) | INTRAMUSCULAR | Status: DC | PRN
  Administered 2020-03-21: 25 mg via INTRAVENOUS
  Filled 2020-03-21: qty 1

## 2020-03-21 MED ORDER — FAMOTIDINE IN NACL 20-0.9 MG/50ML-% IV SOLN
20.0000 mg | Freq: Once | INTRAVENOUS | Status: AC
Start: 2020-03-21 — End: 2020-03-21
  Administered 2020-03-21: 20 mg via INTRAVENOUS
  Filled 2020-03-21: qty 50

## 2020-03-21 MED ORDER — METOPROLOL SUCCINATE ER 50 MG PO TB24
50.0000 mg | ORAL_TABLET | Freq: Every day | ORAL | Status: DC
  Administered 2020-03-22: 50 mg via ORAL
  Filled 2020-03-21: qty 1

## 2020-03-21 MED ORDER — ISOSORBIDE MONONITRATE ER 30 MG PO TB24
30.0000 mg | ORAL_TABLET | Freq: Every day | ORAL | Status: DC
  Administered 2020-03-21 – 2020-03-22 (×2): 30 mg via ORAL
  Filled 2020-03-21 (×2): qty 1

## 2020-03-21 MED ORDER — NICOTINE 21 MG/24HR TD PT24
21.0000 mg | MEDICATED_PATCH | Freq: Every day | TRANSDERMAL | Status: DC
  Administered 2020-03-21 – 2020-03-22 (×2): 21 mg via TRANSDERMAL
  Filled 2020-03-21 (×2): qty 1

## 2020-03-21 MED ORDER — EPINEPHRINE 0.3 MG/0.3ML IJ SOAJ
0.3000 mg | Freq: Once | INTRAMUSCULAR | Status: AC
  Administered 2020-03-21: 0.3 mg via INTRAMUSCULAR
  Filled 2020-03-21: qty 0.3

## 2020-03-21 MED ORDER — MOMETASONE FURO-FORMOTEROL FUM 100-5 MCG/ACT IN AERO
2.0000 | INHALATION_SPRAY | Freq: Two times a day (BID) | RESPIRATORY_TRACT | Status: DC
  Administered 2020-03-21 – 2020-03-22 (×2): 2 via RESPIRATORY_TRACT
  Filled 2020-03-21: qty 8.8

## 2020-03-21 MED ORDER — NITROGLYCERIN 0.4 MG SL SUBL
0.4000 mg | SUBLINGUAL_TABLET | SUBLINGUAL | Status: DC | PRN
  Administered 2020-03-22 (×3): 0.4 mg via SUBLINGUAL
  Filled 2020-03-21 (×5): qty 1

## 2020-03-21 MED ORDER — PANTOPRAZOLE SODIUM 40 MG PO TBEC
40.0000 mg | DELAYED_RELEASE_TABLET | Freq: Every day | ORAL | Status: DC
  Administered 2020-03-21 – 2020-03-22 (×2): 40 mg via ORAL
  Filled 2020-03-21 (×2): qty 1

## 2020-03-21 MED ORDER — SODIUM CHLORIDE 0.9 % IV SOLN: INTRAVENOUS | Status: DC

## 2020-03-21 MED ORDER — METHYLPREDNISOLONE SODIUM SUCC 125 MG IJ SOLR
125.0000 mg | Freq: Once | INTRAMUSCULAR | Status: AC
Start: 2020-03-21 — End: 2020-03-21
  Administered 2020-03-21: 125 mg via INTRAVENOUS
  Filled 2020-03-21: qty 2

## 2020-03-21 MED ORDER — ONDANSETRON HCL 4 MG/2ML IJ SOLN: 4.0000 mg | Freq: Four times a day (QID) | INTRAMUSCULAR | Status: DC | PRN

## 2020-03-21 MED ORDER — ATOMOXETINE HCL 60 MG PO CAPS
80.0000 mg | ORAL_CAPSULE | Freq: Every day | ORAL | Status: DC
  Administered 2020-03-21 – 2020-03-22 (×2): 80 mg via ORAL
  Filled 2020-03-21 (×4): qty 2

## 2020-03-21 MED ORDER — LEVOTHYROXINE SODIUM 50 MCG PO TABS
50.0000 ug | ORAL_TABLET | Freq: Every day | ORAL | Status: DC
  Administered 2020-03-22: 50 ug via ORAL
  Filled 2020-03-21: qty 1

## 2020-03-21 MED ORDER — ALBUTEROL SULFATE (2.5 MG/3ML) 0.083% IN NEBU
2.5000 mg | INHALATION_SOLUTION | Freq: Once | RESPIRATORY_TRACT | Status: AC
  Administered 2020-03-21: 2.5 mg via RESPIRATORY_TRACT
  Filled 2020-03-21: qty 3

## 2020-03-21 MED ORDER — ZIPRASIDONE HCL 40 MG PO CAPS
60.0000 mg | ORAL_CAPSULE | Freq: Two times a day (BID) | ORAL | Status: DC
  Administered 2020-03-21 – 2020-03-22 (×2): 60 mg via ORAL
  Filled 2020-03-21 (×5): qty 1

## 2020-03-21 MED ORDER — PRAVASTATIN SODIUM 20 MG PO TABS
80.0000 mg | ORAL_TABLET | Freq: Every day | ORAL | Status: DC
  Administered 2020-03-22: 80 mg via ORAL
  Filled 2020-03-21: qty 2

## 2020-03-21 MED ORDER — FLUTICASONE PROPIONATE 50 MCG/ACT NA SUSP
1.0000 | Freq: Every day | NASAL | Status: DC
  Administered 2020-03-22: 1 via NASAL
  Filled 2020-03-21 (×2): qty 16

## 2020-03-21 MED ORDER — MIRTAZAPINE 15 MG PO TABS
15.0000 mg | ORAL_TABLET | Freq: Every day | ORAL | Status: DC
  Administered 2020-03-22: 15 mg via ORAL
  Filled 2020-03-21: qty 1

## 2020-03-21 MED ORDER — TAMSULOSIN HCL 0.4 MG PO CAPS
0.4000 mg | ORAL_CAPSULE | Freq: Every day | ORAL | Status: DC
  Administered 2020-03-22: 0.4 mg via ORAL
  Filled 2020-03-21: qty 1

## 2020-03-21 MED ORDER — POLYETHYLENE GLYCOL 3350 17 G PO PACK: 17.0000 g | PACK | Freq: Every day | ORAL | Status: DC | PRN

## 2020-03-21 MED ORDER — POTASSIUM CITRATE ER 10 MEQ (1080 MG) PO TBCR
20.0000 meq | EXTENDED_RELEASE_TABLET | Freq: Two times a day (BID) | ORAL | Status: DC
  Administered 2020-03-22 (×2): 20 meq via ORAL
  Filled 2020-03-21 (×4): qty 2

## 2020-03-21 MED ORDER — OXYCODONE HCL 5 MG PO TABS
5.0000 mg | ORAL_TABLET | Freq: Once | ORAL | Status: AC
  Administered 2020-03-21: 5 mg via ORAL
  Filled 2020-03-21: qty 1

## 2020-03-21 MED ORDER — ONDANSETRON HCL 4 MG PO TABS: 4.0000 mg | ORAL_TABLET | Freq: Four times a day (QID) | ORAL | Status: DC | PRN

## 2020-03-21 MED ORDER — TRAZODONE HCL 100 MG PO TABS
300.0000 mg | ORAL_TABLET | Freq: Every day | ORAL | Status: DC
  Administered 2020-03-22: 300 mg via ORAL
  Filled 2020-03-21: qty 3

## 2020-03-21 MED ORDER — LORATADINE 10 MG PO TABS
10.0000 mg | ORAL_TABLET | Freq: Every day | ORAL | Status: DC
  Administered 2020-03-22: 10 mg via ORAL
  Filled 2020-03-21: qty 1

## 2020-03-21 MED ORDER — DIPHENHYDRAMINE HCL 50 MG/ML IJ SOLN
25.0000 mg | Freq: Once | INTRAMUSCULAR | Status: AC
Start: 2020-03-21 — End: 2020-03-21
  Administered 2020-03-21: 25 mg via INTRAVENOUS
  Filled 2020-03-21: qty 1

## 2020-03-21 NOTE — ED Notes (Signed)
Called dietary again for pt's meal tray per Annice Pih she will send up another one.

## 2020-03-21 NOTE — ED Notes (Signed)
Pt given meal tray.
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
## 2020-03-21 NOTE — ED Notes (Signed)
Pt placed on RA, O2 currently at 93-94%. Pt states still little tightness in chest.

## 2020-03-21 NOTE — ED Notes (Signed)
Pharmacy messaged regarding missing medications. °

## 2020-03-21 NOTE — ED Notes (Signed)
Called pharmacy and spoke with Barbara Cower and he will verify meds and sent up asap

## 2020-03-21 NOTE — ED Notes (Signed)
Called pharmacy and spoke with Barbara Cower who states he will verify medications and send up

## 2020-03-21 NOTE — ED Triage Notes (Signed)
Pt here with allergic  Reaction following bee stings. Pt states he stepped into a yellow jacket nest. Pt gave himself an EPI.  Pt states some SOB and trouble breathing. Pt talking in complete sentences. O2 at 86% RA. Pt states he feels lightheaded.  Pt states he was stung all over entire body.

## 2020-03-21 NOTE — H&P (Addendum)
History and Physical    Brett Fernandez HYW:[id number] DOB: 1960-12-04 DOA: 03/21/2020  PCP: Dortha Kern, MD Consultants:  none Patient coming from:  Home - lives with wife   Chief Complaint: sob, respiratory distress  HPI: Brett Fernandez is a 59 y.o. male with medical history significant for COPD not on home oxygen, CHF, GERD, hypertension, hypothyroidism and coronary artery disease presents emergency department chief complaint shortness of breath and respiratory distress after multiple bee stings.  He reports he was trimming a tree ran into a "yellow jacket nest".  He reports multiple stings on bilateral upper extremities bilateral lower extremities and torso.  He began to feel "tightness in his throat" and experience shortness of breath with difficulty breathing.  He administered EpiPen to himself and called 911.  Associated symptoms include this in his legs and vision changes similar to "a dark curtain slowly being put over my face".  Denies chest pain palpitations headache dizziness syncope or near syncope.  Denies abdominal pain nausea vomiting diarrhea constipation.  Denies any dysuria hematuria frequency or urgency.  He does continue to smoke.  EMS arrived and he received a second EpiPen and states after that 1 I began to feel better.  In the emergency department oxygen saturation level 86% on room air, he had tachypnea and increased work of breathing.  Chest x-ray no evidence of acute cardiopulmonary abnormality, redemonstrated prominence of the interstitial lung markings greatest within the lung bases suspicious for possible underlying interstitial lung disease, emphysema with biapical bull, he was afebrile hemodynamically stable.  Provided with a third EpiPen, Solu-Medrol, Pepcid, Benadryl as well as nebulizer and pain med.  He reports breathing much better after this but at the time of admission felt slight return of "tightness in throat". Labs reveal K+3.4, 59, HS trop 9>>21, INR 3.0  (home coumadin)    ED Course: see above  Review of Systems: As per HPI; otherwise review of systems reviewed and negative.   Ambulatory Status:  Ambulates without assistance  Past Medical History:  Diagnosis Date  . Acute respiratory failure with hypoxia (HCC) 03/21/2020   after multiple bee stings  . Allergy   . Anginal pain (HCC)   . Bipolar disorder (HCC)   . BPH (benign prostatic hypertrophy)   . CHF (congestive heart failure) (HCC)   . COPD (chronic obstructive pulmonary disease) (HCC)   . COPD exacerbation (HCC) 11/02/2015  . Coronary artery disease   . Deep vein thrombosis (DVT) (HCC)   . DJD (degenerative joint disease)   . Gastric ulcer   . GERD (gastroesophageal reflux disease)   . History of hiatal hernia   . Hypertension   . Hypothyroidism   . Kidney stones   . Lethargy 11/02/2015  . Neuromuscular disorder (HCC)   . PTSD (post-traumatic stress disorder)     Past Surgical History:  Procedure Laterality Date  . APPENDECTOMY    . colon polyps    . HEMORRHOIDECTOMY WITH HEMORRHOID BANDING    . HERNIA REPAIR    . INNER EAR SURGERY    . JOINT REPLACEMENT Left   . TOTAL KNEE ARTHROPLASTY Right 11/01/2015   Procedure: TOTAL KNEE ARTHROPLASTY;  Surgeon: Juanell Fairly, MD;  Location: [facility] ORS;  Service: Orthopedics;  Laterality: Right;    Social History   Socioeconomic History  . Marital status: Divorced    Spouse name: Not on file  . Number of children: Not on file  . Years of education: Not on file  . Highest  education level: Not on file  Occupational History  . Not on file  Tobacco Use  . Smoking status: Current Every Day Smoker    Packs/day: 1.50    Types: Cigarettes  . Smokeless tobacco: Never Used  . Tobacco comment: cutting down, not ready to quit  Vaping Use  . Vaping Use: Never used  Substance and Sexual Activity  . Alcohol use: No  . Drug use: No  . Sexual activity: Not on file  Other Topics Concern  . Not on file  Social History  Narrative  . Not on file   Social Determinants of Health   Financial Resource Strain:   . Difficulty of Paying Living Expenses:   Food Insecurity:   . Worried About Programme researcher, broadcasting/film/video in the Last Year:   . Barista in the Last Year:   Transportation Needs:   . Freight forwarder (Medical):   Marland Kitchen Lack of Transportation (Non-Medical):   Physical Activity:   . Days of Exercise per Week:   . Minutes of Exercise per Session:   Stress:   . Feeling of Stress :   Social Connections:   . Frequency of Communication with Friends and Family:   . Frequency of Social Gatherings with Friends and Family:   . Attends Religious Services:   . Active Member of Clubs or Organizations:   . Attends Banker Meetings:   Marland Kitchen Marital Status:   Intimate Partner Violence:   . Fear of Current or Ex-Partner:   . Emotionally Abused:   Marland Kitchen Physically Abused:   . Sexually Abused:     Allergies  Allergen Reactions  . Aspirin Other (See Comments)    Other Reaction: steven-johnson reaction [name]  . Bee Venom Anaphylaxis  . Sulfa Antibiotics Swelling    Other reaction(s): Unknown    Family History  Problem Relation Age of Onset  . Depression Mother   . Diabetes Mother   . Heart disease Mother   . COPD Mother   . Heart disease Father   . Cancer Father     Prior to Admission medications   Medication Sig Start Date End Date Taking? Authorizing Provider  acetaminophen (TYLENOL) 325 MG tablet Take 650 mg by mouth every 6 (six) hours as needed.    [provider]  atomoxetine (STRATTERA) 40 MG capsule Take 80 mg by mouth daily.    [provider]  DULoxetine (CYMBALTA) 60 MG capsule Take 60 mg by mouth 2 (two) times daily.     [provider]  finasteride (PROSCAR) 5 MG tablet Take 5 mg by mouth daily.     [provider]  fluticasone (FLONASE) 50 MCG/ACT nasal spray Place 1 spray into both nostrils in the morning and at bedtime.      [provider]  Ipratropium-Albuterol (COMBIVENT) 20-100 MCG/ACT AERS respimat Inhale 2 puffs into the lungs every 6 (six) hours. 09/12/19   Alford Highland, MD  isosorbide mononitrate (IMDUR) 30 MG 24 hr tablet Take 1 tablet (30 mg total) by mouth daily. 09/12/19 04/25/23  Alford Highland, MD  levothyroxine (SYNTHROID, LEVOTHROID) 50 MCG tablet Take 50 mcg by mouth daily before breakfast.     [provider]  loratadine (CLARITIN) 10 MG tablet Take 10 mg by mouth daily.     [provider]  metoprolol succinate (TOPROL-XL) 50 MG 24 hr tablet Take 50 mg by mouth daily.     [provider]  mirtazapine (REMERON) 15  MG tablet Take 15 mg by mouth daily.    [provider]  mometasone-formoterol (DULERA) 100-5 MCG/ACT AERO Inhale 2 puffs into the lungs 2 (two) times daily. 09/12/19   Alford Highland, MD  nicotine (NICODERM CQ - DOSED IN MG/24 HOURS) 21 mg/24hr patch Place 1 patch (21 mg total) onto the skin daily. 03/06/20   Enedina Finner, MD  nitroGLYCERIN (NITROSTAT) 0.4 MG SL tablet Place 1 tablet (0.4 mg total) under the tongue every 5 (five) minutes as needed for chest pain. 03/05/20   Enedina Finner, MD  omeprazole (PRILOSEC) 40 MG capsule Take 40 mg by mouth daily.     [provider]  ondansetron (ZOFRAN) 4 MG tablet Take 1 tablet (4 mg total) by mouth every 6 (six) hours as needed for nausea. 09/12/19   Alford Highland, MD  oxyCODONE (OXY IR/ROXICODONE) 5 MG immediate release tablet Take 1 tablet (5 mg total) by mouth every 6 (six) hours as needed for severe pain. 01/08/17 02/07/17  Delano Metz, MD  polyethylene glycol (MIRALAX / GLYCOLAX) 17 g packet Take 17 g by mouth daily as needed. 09/12/19   Alford Highland, MD  potassium citrate (UROCIT-K) 10 MEQ (1080 MG) SR tablet Take 20 mEq by mouth 2 (two) times daily.     [provider]  pravastatin (PRAVACHOL) 80 MG tablet Take 80 mg by mouth at bedtime.     [provider]  pregabalin (LYRICA) 75 MG capsule Take 4 capsules (300 mg total) by mouth 2 (two) times daily. 09/12/19 03/04/20  Alford Highland, MD  tamsulosin (FLOMAX) 0.4 MG CAPS capsule Take 0.4 mg by mouth daily.     [provider]  traZODone (DESYREL) 100 MG tablet Take 300 mg by mouth at bedtime.     [provider]  warfarin (COUMADIN) 4 MG tablet Take 4 mg by mouth every evening.     [provider]  ziprasidone (GEODON) 60 MG capsule Take 60 mg by mouth 2 (two) times daily with a meal.     [provider]    Physical Exam: Vitals:   03/21/20 1212 03/21/20 1213 03/21/20 1236 03/21/20 1330  BP:    (!) 154/102  Pulse: 70 71 75 71  Resp: 14 (!) 21 (!) 31 17  Temp:      SpO2: 92% 91% 90% (!) 89%  Weight:      Height:         . General:  Appears calm and comfortable and is NAD . Eyes:  PERRL, EOMI, normal lids, iris . ENT:  grossly normal hearing, lips & tongue, mmm; appropriate dentition . Neck:  no LAD, masses or thyromegaly; no carotid bruits . Cardiovascular:  RRR, no m/r/g. No LE edema. PPP . Respiratory: no increase work of breathing. Audible wheezes throughout.  . Abdomen: obese  soft, NT, ND, NABS . Back:   normal alignment, no CVAT . Skin:  Multiple sting sites bilateral arms and torso with localized edema and erythema at sites. Otherwise no rash/lesions . Musculoskeletal:  grossly normal tone BUE/BLE, good ROM, no bony abnormality . Lower extremity:  No LE edema.  Limited foot exam with no ulcerations.  2+ distal pulses. Marland Kitchen Psychiatric:  grossly normal mood and affect, speech fluent and appropriate, AOx3 . Neurologic:  CN 2-12 grossly intact, moves all extremities in coordinated fashion, sensation intact speech slow but clear  Radiological Exams on Admission: DG Chest Portable 1 View  Result Date: 03/21/2020 CLINICAL DATA:  Shortness of breath. EXAM:  PORTABLE CHEST 1 VIEW COMPARISON:  Prior chest CT 03/04/2020, chest radiograph 03/04/2020  FINDINGS: Heart size within normal limits. Aortic atherosclerosis. Redemonstrated prominence of the interstitial lung markings greatest within the lung bases suspicious for possible underlying interstitial lung disease. Emphysema with biapical bulla. No appreciable airspace consolidation. No frank pulmonary edema. No evidence of pleural effusion or pneumothorax. IMPRESSION: No evidence of acute cardiopulmonary abnormality. Redemonstrated prominence of the interstitial lung markings greatest within the lung bases suspicious for possible underlying interstitial lung disease. Emphysema with biapical bulla. Aortic Atherosclerosis (ICD10-I70.0). Electronically Signed   By: Jackey Loge DO   On: 03/21/2020 10:53    EKG: Independently reviewed.  NSR   Labs on Admission: I have personally reviewed the available labs and imaging studies at the time of the admission.  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  Acute respiratory failure with hypoxia secondary to anaphylactic reaction to bee stings in a patient with a history of chronic bronchitis/COPD and current tobacco use.  Oxygen saturation level 86% on room air in the emergency department after 2 EpiPen's.  Provided with oxygen supplementation and oxygen saturation level increased to 92% on 3 L nasal cannula.  Chest x-ray as noted above. No signs infiltrate.  He was provided with additional EpiPen, Solu-Medrol, nebulizer, Pepcid, Benadryl.  Improved  initially by the time of admission stated he felt like his "throat was starting to tighten up".  Of note patient hospitalized 2 weeks ago with chest pain rule out.  During that hospitalization he developed hypoxemia suspected related to COPD/emphysema in the setting of ongoing tobacco use.  At that time he had a CTA that ruled out a PE.  He does have a history of DVT and is on Coumadin. -Admit for observation -Monitor oxygen saturation level -Oxygen supplementation as indicated -EpiPen -Continue Benadryl Pepcid -Monitor closely  #2.  COPD/chronic bronchitis/current tobacco user.  Not on home oxygen.  Home meds include inhaler.  He is provided nebulizer as noted above in the emergency department -See #1 -As needed nebulizers -Continue to assess for need for further steroids -Nicotine patch -Cessation counseling offered  3.  Hypertension.  Uncontrolled.  Home medications include metoprolol, imdur. -resume home meds -prn hydralazine -monitor  4.  CAD/hyperlipidemia.  Recent hospitalization with chest pain and normal HS trops.  Ruled out MI. Underwent stress test 03/12/20 that per chart review was normal.  Home medications include Imdur, statin and metoprolol.  Chart review indicates he is allergic to aspirin.  During that hospitalization he was evaluated by cardiology who recommended stress test. Per Dr. Charlette Caffey negative.  -Continue home meds  #5.  Chronic pain syndrome/fibromyalgia/long term use of opiate. Home medications include oxycodone, Lyrica.  Appears stable at baseline with the exception of painful bee stings -continue home meds -ice to bee stings  6.  Bipolar disorder/depression.  Appears stable at baseline.  Home medications include Geodon, remeron, cymbalta, strattera -continue home meds  #7. Hx dvt. Home meds include coumadin. INR  3.0 on admission -Hold coumadin for now since INR is 3 - Daily PT/INR    Note: This patient has been tested and is negative for the  novel coronavirus COVID-19.  DVT prophylaxis: coumadin Code Status:  Full - confirmed with patient/family Family Communication:   Disposition Plan:  Home once clinically improved Consults called: none Admission status: obs   Toya Smothers M NP [facility]   How to contact the [facility] Attending or Consulting provider 7A - 7P or covering provider during after hours 7P -7A, for this patient?  1. Check the care team in [facility] and look for a) attending/consulting [facility] provider listed and b) the [facility] team listed 2. Log into [email or web address] and use [place]'s universal password to access. If you do not have the password, please contact the hospital operator. 3. Locate the [facility] provider you are looking for under [facility] and page to a number that you can be directly reached. 4. If you still have difficulty reaching the provider, please page the [facility] (Director on Call) for the Hospitalists listed on amion for assistance.   03/21/2020, 1:46 PM

## 2020-03-21 NOTE — ED Notes (Signed)
Called pharmacy to check on medications for pt. Pharmacy to verify right now and send.

## 2020-03-21 NOTE — ED Notes (Signed)
Called dietary to check on pt's meal tray. Per Annice Pih she will send one up

## 2020-03-22 ENCOUNTER — Encounter: Payer: Self-pay | Admitting: Internal Medicine

## 2020-03-22 DIAGNOSIS — J9601 Acute respiratory failure with hypoxia: Secondary | ICD-10-CM | POA: Diagnosis not present

## 2020-03-22 DIAGNOSIS — R079 Chest pain, unspecified: Secondary | ICD-10-CM

## 2020-03-22 DIAGNOSIS — F17200 Nicotine dependence, unspecified, uncomplicated: Secondary | ICD-10-CM

## 2020-03-22 DIAGNOSIS — I82409 Acute embolism and thrombosis of unspecified deep veins of unspecified lower extremity: Secondary | ICD-10-CM

## 2020-03-22 DIAGNOSIS — J449 Chronic obstructive pulmonary disease, unspecified: Secondary | ICD-10-CM | POA: Diagnosis not present

## 2020-03-22 DIAGNOSIS — T782XXA Anaphylactic shock, unspecified, initial encounter: Secondary | ICD-10-CM | POA: Diagnosis not present

## 2020-03-22 DIAGNOSIS — F3170 Bipolar disorder, currently in remission, most recent episode unspecified: Secondary | ICD-10-CM | POA: Diagnosis not present

## 2020-03-22 DIAGNOSIS — G894 Chronic pain syndrome: Secondary | ICD-10-CM

## 2020-03-22 LAB — CBC
HCT: 40.5 % (ref 39.0–52.0)
Hemoglobin: 14.5 g/dL (ref 13.0–17.0)
MCH: 33.1 pg (ref 26.0–34.0)
MCHC: 35.8 g/dL (ref 30.0–36.0)
MCV: 92.5 fL (ref 80.0–100.0)
Platelets: 220 10*3/uL (ref 150–400)
RBC: 4.38 MIL/uL (ref 4.22–5.81)
RDW: 13.9 % (ref 11.5–15.5)
WBC: 15.7 10*3/uL — ABNORMAL HIGH (ref 4.0–10.5)
nRBC: 0 % (ref 0.0–0.2)

## 2020-03-22 LAB — BASIC METABOLIC PANEL
Anion gap: 9 (ref 5–15)
BUN: 15 mg/dL (ref 6–20)
CO2: 24 mmol/L (ref 22–32)
Calcium: 9.1 mg/dL (ref 8.9–10.3)
Chloride: 106 mmol/L (ref 98–111)
Creatinine, Ser: 0.98 mg/dL (ref 0.61–1.24)
GFR calc Af Amer: >60 mL/min (ref >60)
GFR calc non Af Amer: >60 mL/min (ref >60)
Glucose, Bld: 144 mg/dL — ABNORMAL HIGH (ref 70–99)
Potassium: 4.2 mmol/L (ref 3.5–5.1)
Sodium: 139 mmol/L (ref 135–145)

## 2020-03-22 LAB — PROTIME-INR
INR: 1.4 — ABNORMAL HIGH (ref 0.8–1.2)
Prothrombin Time: 16.9 seconds — ABNORMAL HIGH (ref 11.4–15.2)

## 2020-03-22 MED ORDER — PREDNISONE 10 MG PO TABS
ORAL_TABLET | ORAL | 0 refills | Status: DC
Start: 2020-03-22 — End: 2021-03-12

## 2020-03-22 MED ORDER — EPINEPHRINE 0.3 MG/0.3ML IJ SOAJ: 0.3000 mg | INTRAMUSCULAR | 0 refills | Status: AC | PRN

## 2020-03-22 MED ORDER — ALBUTEROL SULFATE (2.5 MG/3ML) 0.083% IN NEBU
2.5000 mg | INHALATION_SOLUTION | Freq: Four times a day (QID) | RESPIRATORY_TRACT | Status: DC | PRN
  Administered 2020-03-22: 2.5 mg via RESPIRATORY_TRACT
  Filled 2020-03-22: qty 3

## 2020-03-22 MED ORDER — DULERA 100-5 MCG/ACT IN AERO: 2.0000 | INHALATION_SPRAY | Freq: Two times a day (BID) | RESPIRATORY_TRACT | 0 refills | Status: DC

## 2020-03-22 MED ORDER — NICOTINE 21 MG/24HR TD PT24: 21.0000 mg | MEDICATED_PATCH | Freq: Every day | TRANSDERMAL | 0 refills | Status: DC

## 2020-03-22 MED ORDER — MORPHINE SULFATE (PF) 2 MG/ML IV SOLN
2.0000 mg | INTRAVENOUS | Status: DC | PRN
  Administered 2020-03-22 (×3): 2 mg via INTRAVENOUS
  Filled 2020-03-22 (×3): qty 1

## 2020-03-22 MED ORDER — ALBUTEROL SULFATE (2.5 MG/3ML) 0.083% IN NEBU: 2.5000 mg | INHALATION_SOLUTION | Freq: Four times a day (QID) | RESPIRATORY_TRACT | 12 refills | Status: AC | PRN

## 2020-03-22 MED ORDER — NEBULIZER SYSTEM ALL-IN-ONE MISC: 0 refills | Status: AC

## 2020-03-22 NOTE — Progress Notes (Signed)
Pt c/o chest pain, RN gave nitro x3, no relief, RN gave morphine IV and notified MD, Charge nurse made aware. Dr. Kerry Hough ordered a EKG. EKG done. Pt says chest pain was relieved. RN made MD aware. RN waiting on response.

## 2020-03-22 NOTE — Progress Notes (Signed)
Pt ready for dc. RN went over discharge packet and prescription. Pt denies any further questions or needs. Waiting on wheelchair to wheel pt down.

## 2020-03-22 NOTE — Plan of Care (Signed)

## 2020-03-22 NOTE — TOC Progression Note (Signed)
Transition of Care [facility]) - Progression Note    Patient Details  Name: Brett Fernandez MRN: [id number] Date of Birth: [birth year]  Transition of Care [facility]) CM/SW Sehili, RN Phone Number: 03/22/2020, 3:58 PM  Clinical Narrative:    Met with the patient to discuss DC plan and needs He lives at home alone, he drives and is independent at home, He stated that he has a PCP and can afford his medications,  He stated that He has no needs at this time              Expected Discharge Plan: Home/Self Care Barriers to Discharge: Continued Medical Work up  Expected Discharge Plan and Services Expected Discharge Plan: Home/Self Care       Living arrangements for the past 2 months: Single Family Home Expected Discharge Date: 03/23/20               DME Arranged: N/A         HH Arranged: NA           Social Determinants of Health (SDOH) Interventions    Readmission Risk Interventions Readmission Risk Prevention Plan 09/12/2019 09/08/2019  Transportation Screening Complete Complete  PCP or Specialist Appt within 3-5 Days Complete -  Palliative Care Screening Not Applicable Not Applicable  Medication Review (RN Care Manager) Complete Complete  Some recent data might be hidden

## 2020-03-22 NOTE — Progress Notes (Signed)
RN called RT for PRN for nebulizer treatment. RT will be to pt's room shortly per RT

## 2020-03-22 NOTE — Progress Notes (Deleted)
Physician Discharge Summary  Brett Fernandez WCB:[id number] DOB: September 15, 1961 DOA: 03/21/2020  PCP: Dortha Kern, MD  Admit date: 03/21/2020 Discharge date: 03/22/2020  Admitted From: home Disposition:  home  Recommendations for Outpatient Follow-up:  1. Follow up with PCP in 1-2 weeks 2. Please obtain BMP/CBC in one week  Home Health: Equipment/Devices:  Discharge Condition:stable CODE STATUS:full code Diet recommendation: heart healthy  Brief/Interim Summary: HPI: Brett Fernandez is a 59 y.o. male with medical history significant for COPD not on home oxygen, CHF, GERD, hypertension, hypothyroidism and coronary artery disease presents emergency department chief complaint shortness of breath and respiratory distress after multiple bee stings.  He reports he was trimming a tree ran into a "yellow jacket nest".  He reports multiple stings on bilateral upper extremities bilateral lower extremities and torso.  He began to feel "tightness in his throat" and experience shortness of breath with difficulty breathing.  He administered EpiPen to himself and called 911.  Associated symptoms include this in his legs and vision changes similar to "a dark curtain slowly being put over my face".  Denies chest pain palpitations headache dizziness syncope or near syncope.  Denies abdominal pain nausea vomiting diarrhea constipation.  Denies any dysuria hematuria frequency or urgency.  He does continue to smoke.  EMS arrived and he received a second EpiPen and states after that 1 I began to feel better.  In the emergency department oxygen saturation level 86% on room air, he had tachypnea and increased work of breathing.  Chest x-ray no evidence of acute cardiopulmonary abnormality, redemonstrated prominence of the interstitial lung markings greatest within the lung bases suspicious for possible underlying interstitial lung disease, emphysema with biapical bull, he was afebrile hemodynamically stable.  Provided  with a third EpiPen, Solu-Medrol, Pepcid, Benadryl as well as nebulizer and pain med.  He reports breathing much better after this but at the time of admission felt slight return of "tightness in throat". 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 Acute resp failure with hypoxia due to anaphylactic reaction to bee sting. Patient treated with epi pen, intravenous steroids, benadryl and bronchodilators. He has been weaned off oxygen and respiratory status appears to be back to baseline 2. COPD. No wheezing at this time. He will discharge with a prednisone taper for #1. Continue on bronchodilators 3. Tobacco abuse. Patient reports smoking 1.5-2ppd. Strongly advised to quit smoking. Provided nicotine patches 4. HTN. Stable on metoprolol, imdur 5. Chest pain. troponins unremarkable and no acute changes on EKG. Symptoms atypical. Unlikely related to cardiac etiology 6. Chronic pain syndrome/fibromyalgia. Continue home regimen 7. History of DVT. Continue anticoagulation with coumadin  Discharge Instructions  Discharge Instructions    Diet - low sodium heart healthy   Complete by: As directed    For home use only DME Nebulizer machine   Complete by: As directed    Patient needs a nebulizer to treat with  the following condition: COPD (chronic obstructive pulmonary disease) with emphysema (HCC)   Length of Need: Lifetime   Increase  activity slowly   Complete by: As directed      Allergies as of 03/22/2020      Reactions   Aspirin Other (See Comments)   Other Reaction: steven-johnson reaction [name]   Bee Venom Anaphylaxis   Sulfa Antibiotics Swelling   Other reaction(s): Unknown      Medication List    TAKE these medications   acetaminophen 325 MG tablet Commonly known as: TYLENOL Take 650 mg by mouth every 6 (six) hours as needed for mild pain, moderate pain or headache.   albuterol (2.5 MG/3ML) 0.083% nebulizer solution Commonly known as: PROVENTIL Take 3 mLs (2.5 mg total) by nebulization every 6 (six) hours as needed for wheezing or shortness of breath.   atomoxetine 40 MG capsule Commonly known as: STRATTERA Take 80 mg by mouth daily.   b complex vitamins tablet Take 1 tablet by mouth daily.   calamine lotion Apply 1 application topically as needed for itching.   CENTRUM SILVER 50+MEN PO Take 1 tablet by mouth daily.   Dulera 100-5 MCG/ACT Aero Generic drug: mometasone-formoterol Inhale 2 puffs into the lungs 2 (two) times daily.   DULoxetine 60 MG capsule Commonly known as: CYMBALTA Take 60 mg by mouth 2 (two) times daily.   EPINEPHrine 0.3 mg/0.3 mL Soaj injection Commonly known as: EPI-PEN Inject 0.3 mLs (0.3 mg total) into the muscle as needed for anaphylaxis.   finasteride 5 MG tablet Commonly known as: PROSCAR Take 5 mg by mouth daily.   fluticasone 50 MCG/ACT nasal spray Commonly known as: FLONASE Place 1 spray into both nostrils in the morning and at bedtime.   hydrocortisone cream 1 % Apply 1 application topically 2 (two) times daily as needed for itching.   Ipratropium-Albuterol 20-100 MCG/ACT Aers respimat Commonly known as: COMBIVENT Inhale 2 puffs into the lungs every 6 (six) hours.   isosorbide mononitrate 30 MG 24 hr tablet Commonly known as: IMDUR Take 1 tablet (30 mg total) by mouth daily.   levothyroxine 50 MCG tablet Commonly known as:  SYNTHROID Take 50 mcg by mouth every evening.   loratadine 10 MG tablet Commonly known as: CLARITIN Take 10 mg by mouth daily.   metoprolol succinate 50 MG 24 hr tablet Commonly known as: TOPROL-XL Take 50 mg by mouth daily.   mirtazapine 15 MG tablet Commonly known as: REMERON Take 15 mg by mouth daily.   Nebulizer System All-In-One Misc Use as directed for COPD   nicotine 21 mg/24hr patch Commonly known as: NICODERM CQ - dosed in mg/24 hours Place 1 patch (21 mg total) onto the skin daily.   nitroGLYCERIN 0.4 MG SL tablet Commonly known as: NITROSTAT Place 1 tablet (0.4 mg total) under the tongue every 5 (five) minutes as needed for chest pain.   omeprazole 40 MG capsule Commonly known as: PRILOSEC Take 40 mg by mouth daily.   ondansetron 4 MG tablet Commonly known as: ZOFRAN Take 1 tablet (4 mg total) by mouth every 6 (six) hours as needed for nausea.   oxyCODONE 5 MG immediate release tablet Commonly known as: Oxy IR/ROXICODONE Take 1 tablet (5 mg total) by mouth every 6 (six) hours as needed for severe pain.   polyethylene glycol 17 g packet Commonly known as: MIRALAX / GLYCOLAX Take 17 g by mouth daily as needed.   potassium citrate 10 MEQ (1080 MG) SR tablet  Commonly known as: UROCIT-K Take 20 mEq by mouth 2 (two) times daily.   pravastatin 80 MG tablet Commonly known as: PRAVACHOL Take 80 mg by mouth at bedtime.   predniSONE 10 MG tablet Commonly known as: DELTASONE Take 40mg  po daily for 2 days then 30mg  daily for 2 days then 20mg  daily for 2 days then 10mg  daily for 2 days then stop   pregabalin 150 MG capsule Commonly known as: LYRICA Take 300 mg by mouth 2 (two) times daily. What changed: Another medication with the same name was removed. Continue taking this medication, and follow the directions you see here.   tamsulosin 0.4 MG Caps capsule Commonly known as: FLOMAX Take 0.4 mg by mouth daily.   traZODone 100 MG tablet Commonly known as:  DESYREL Take 200-300 mg by mouth at bedtime.   warfarin 4 MG tablet Commonly known as: COUMADIN Take 4 mg by mouth every evening.   ziprasidone 60 MG capsule Commonly known as: GEODON Take 60 mg by mouth 2 (two) times daily with a meal.            Durable Medical Equipment  (From admission, onward)         Start     Ordered   03/22/20 0000  For home use only DME Nebulizer machine       Question Answer Comment  Patient needs a nebulizer to treat with the following condition COPD (chronic obstructive pulmonary disease) with emphysema (HCC)   Length of Need Lifetime      03/22/20 1611          Allergies  Allergen Reactions  . Aspirin Other (See Comments)    Other Reaction: steven-johnson reaction Syndrome  . Bee Venom Anaphylaxis  . Sulfa Antibiotics Swelling    Other reaction(s): Unknown    Consultations:     Procedures/Studies: DG Chest 2 View  Result Date: 03/04/2020 CLINICAL DATA:  Chest pain EXAM: CHEST - 2 VIEW COMPARISON:  August 16, 2019 FINDINGS: The heart size and mediastinal contours are within normal limits. Hyperinflation of the upper lung zones is again noted. Aortic knob calcifications. Both lungs are clear. Healing posterior right ninth and tenth rib fractures are seen. IMPRESSION: No active cardiopulmonary disease. Electronically Signed   By: 05/23/20 M.D.   On: 03/04/2020 02:19   CT ANGIO CHEST PE W OR WO CONTRAST  Result Date: 03/04/2020 CLINICAL DATA:  Short of breath, chest pain EXAM: CT ANGIOGRAPHY CHEST WITH CONTRAST TECHNIQUE: Multidetector CT imaging of the chest was performed using the standard protocol during bolus administration of intravenous contrast. Multiplanar CT image reconstructions and MIPs were obtained to evaluate the vascular anatomy. CONTRAST:  39mL OMNIPAQUE IOHEXOL 350 MG/ML SOLN COMPARISON:  None. FINDINGS: Cardiovascular: No filling defects within the pulmonary arteries to suggest acute pulmonary  embolism. Mediastinum/Nodes: No axillary supraclavicular adenopathy no mediastinal hilar adenopathy no pericardial effusion. Esophagus normal. Lungs/Pleura: Paraseptal emphysema the upper lobes. Centrilobular emphysema in the upper lobes. No pulmonary infarction. There is atelectasis in the lower lobes. Subpleural cystic change in the lower lobes. No suspicious nodularity. Upper Abdomen: Limited view of the liver, kidneys, pancreas are unremarkable. Normal adrenal glands. Musculoskeletal: No aggressive osseous lesion. Review of the MIP images confirms the above findings. IMPRESSION: 1. No evidence acute pulmonary embolism. 2. Interstitial lung disease with paraseptal emphysema, centrilobular emphysema, and subpleural cystic change. 1. Electronically Signed   By: Jonna Clark M.D.   On: 03/04/2020 08:10   NM Myocar Multi W/Spect  W/Wall Motion / EF  Result Date: 03/05/2020  There was no ST segment deviation noted during stress.  No T wave inversion was noted during stress.  The study is normal.  This is a low risk study.  The left ventricular ejection fraction is hyperdynamic (>65%).  Nuclear stress EF: 67%.    DG Chest Portable 1 View  Result Date: 03/21/2020 CLINICAL DATA:  Shortness of breath. EXAM: PORTABLE CHEST 1 VIEW COMPARISON:  Prior chest CT 03/04/2020, chest radiograph 03/04/2020 FINDINGS: Heart size within normal limits. Aortic atherosclerosis. Redemonstrated prominence of the interstitial lung markings greatest within the lung bases suspicious for possible underlying interstitial lung disease. Emphysema with biapical bulla. No appreciable airspace consolidation. No frank pulmonary edema. No evidence of pleural effusion or pneumothorax. IMPRESSION: No evidence of acute cardiopulmonary abnormality. Redemonstrated prominence of the interstitial lung markings greatest within the lung bases suspicious for possible underlying interstitial lung disease. Emphysema with biapical bulla. Aortic  Atherosclerosis (ICD10-I70.0). Electronically Signed   By: Jackey Loge DO   On: 03/21/2020 10:53       Subjective: Had some upper abdominal discomfort, worse with deep inspiration, improved with morphine  Discharge Exam: Vitals:   03/22/20 0118 03/22/20 0354 03/22/20 0756 03/22/20 1550  BP: 136/86 (!) 158/96 (!) 127/91 (!) 135/94  Pulse: 81 91 85 85  Resp: Temp: 97.6 F (36.4 C) 97.6 F (36.4 C) 97.6 F (36.4 C) 97.6 F (36.4 C)  TempSrc: Oral Oral Oral Oral  SpO2: 95% 94% 94% 96%  Weight:      Height:        General: Pt is alert, awake, not in acute distress Cardiovascular: RRR, S1/S2 +, no rubs, no gallops Respiratory: CTA bilaterally, no wheezing, no rhonchi Abdominal: Soft, NT, ND, bowel sounds + Extremities: no edema, no cyanosis    The results of significant diagnostics from this hospitalization (including imaging, microbiology, ancillary and laboratory) are listed below for reference.     Microbiology: Recent Results (from the past 240 hour(s))  SARS Coronavirus 2 by RT PCR (hospital order, performed in [facility] hospital lab) Nasopharyngeal Nasopharyngeal Swab     Status: None   Collection Time: 03/21/20 10:48 AM   Specimen: Nasopharyngeal Swab  Result Value Ref Range Status   SARS Coronavirus 2 NEGATIVE NEGATIVE Final    Comment: (NOTE) SARS-CoV-2 target nucleic acids are NOT DETECTED.  The SARS-CoV-2 RNA is generally detectable in upper and lower respiratory specimens during the acute phase of infection. The lowest concentration of SARS-CoV-2 viral copies this assay can detect is 250 copies / mL. A negative result does not preclude SARS-CoV-2 infection and should not be used as the sole basis for treatment or other patient management decisions.  A negative result may occur with improper specimen collection / handling, submission of specimen other than nasopharyngeal swab, presence of viral mutation(s) within the areas targeted by this  assay, and inadequate number of viral copies (<250 copies / mL). A negative result must be combined with clinical observations, patient history, and epidemiological information.  Fact Sheet for Patients:   BoilerBrush.com.cy  Fact Sheet for Healthcare Providers: https://pope.com/  This test is not yet approved or  cleared by the [place] FDA and has been authorized for detection and/or diagnosis of SARS-CoV-2 by FDA under an Emergency Use Authorization (EUA).  This EUA will remain in effect (meaning this test can be used) for the duration of the COVID-19 declaration under Section 564(b)(1) of the Act, 21 U.S.C.  section 360bbb-3(b)(1), unless the authorization is terminated or revoked sooner.  Performed at [facility], [address] [place], [place] [address]      Labs: BNP (last 3 results) No results for input(s): BNP in the last 8760 hours. Basic Metabolic Panel: Recent Labs  Lab 03/21/20 1048 03/22/20 0520  NA 138 139  K 3.4* 4.2  CL 100 106  CO2 25 24  GLUCOSE 159* 144*  BUN 13 15  CREATININE 1.19 0.98  CALCIUM 8.9 9.1   Liver Function Tests: Recent Labs  Lab 03/21/20 1048  AST 23  ALT 20  ALKPHOS 88  BILITOT 0.7  PROT 7.2  ALBUMIN 4.1   No results for input(s): LIPASE, AMYLASE in the last 168 hours. No results for input(s): AMMONIA in the last 168 hours. CBC: Recent Labs  Lab 03/21/20 1048 03/22/20 0520  WBC 11.8* 15.7*  NEUTROABS 6.7  --   HGB 14.9 14.5  HCT 42.4 40.5  MCV 93.6 92.5  PLT 225 220   Cardiac Enzymes: No results for input(s): CKTOTAL, CKMB, CKMBINDEX, TROPONINI in the last 168 hours. BNP: Invalid input(s): POCBNP CBG: No results for input(s): GLUCAP in the last 168 hours. D-Dimer No results for input(s): DDIMER in the last 72 hours. Hgb A1c No results for input(s): HGBA1C in the last 72 hours. Lipid Profile No results for input(s): CHOL, HDL, LDLCALC, TRIG,  CHOLHDL, LDLDIRECT in the last 72 hours. Thyroid function studies No results for input(s): TSH, T4TOTAL, T3FREE, THYROIDAB in the last 72 hours.  Invalid input(s): FREET3 Anemia work up No results for input(s): VITAMINB12, FOLATE, FERRITIN, TIBC, IRON, RETICCTPCT in the last 72 hours. Urinalysis    Component Value Date/Time   COLORURINE YELLOW (A) 09/09/2019 1135   APPEARANCEUR CLEAR (A) 09/09/2019 1135   APPEARANCEUR Clear 10/05/2014 1436   LABSPEC 1.012 09/09/2019 1135   LABSPEC 1.009 10/05/2014 1436   PHURINE 6.0 09/09/2019 1135   GLUCOSEU NEGATIVE 09/09/2019 1135   GLUCOSEU Negative 10/05/2014 1436   HGBUR NEGATIVE 09/09/2019 1135   BILIRUBINUR NEGATIVE 09/09/2019 1135   BILIRUBINUR Negative 10/05/2014 1436   KETONESUR 80 (A) 09/09/2019 1135   PROTEINUR 30 (A) 09/09/2019 1135   NITRITE NEGATIVE 09/09/2019 1135   LEUKOCYTESUR NEGATIVE 09/09/2019 1135   LEUKOCYTESUR Negative 10/05/2014 1436   Sepsis Labs Invalid input(s): PROCALCITONIN,  WBC,  LACTICIDVEN Microbiology Recent Results (from the past 240 hour(s))  SARS Coronavirus 2 by RT PCR (hospital order, performed in [facility] hospital lab) Nasopharyngeal Nasopharyngeal Swab     Status: None   Collection Time: 03/21/20 10:48 AM   Specimen: Nasopharyngeal Swab  Result Value Ref Range Status   SARS Coronavirus 2 NEGATIVE NEGATIVE Final    Comment: (NOTE) SARS-CoV-2 target nucleic acids are NOT DETECTED.  The SARS-CoV-2 RNA is generally detectable in upper and lower respiratory specimens during the acute phase of infection. The lowest concentration of SARS-CoV-2 viral copies this assay can detect is 250 copies / mL. A negative result does not preclude SARS-CoV-2 infection and should not be used as the sole basis for treatment or other patient management decisions.  A negative result may occur with improper specimen collection / handling, submission of specimen other than nasopharyngeal swab, presence of viral  mutation(s) within the areas targeted by this assay, and inadequate number of viral copies (<250 copies / mL). A negative result must be combined with clinical observations, patient history, and epidemiological information.  Fact Sheet for Patients:   BoilerBrush.com.cy  Fact Sheet for Healthcare Providers:  https://pope.com/  This test is not yet approved or  cleared by the [place] and has been authorized for detection and/or diagnosis of SARS-CoV-2 by FDA under an Emergency Use Authorization (EUA).  This EUA will remain in effect (meaning this test can be used) for the duration of the COVID-19 declaration under Section 564(b)(1) of the Act, 21 U.S.C. section 360bbb-3(b)(1), unless the authorization is terminated or revoked sooner.  Performed at [facility], [address], [place], [place] [address]      Time coordinating discharge:  SIGNED:   Erick Blinks, MD  [facility] 03/22/2020, 7:34 PM   If 7PM-7AM, please contact night-coverage [email or web address]

## 2020-03-22 NOTE — Progress Notes (Signed)
Pt has some inspiratory wheezing in his right lobes, he doesn't have any prn breathing treatments ordered, pt c/o copd exacerbation. SpO2 96% RN notified Memon MD, waiting on response

## 2020-03-22 NOTE — Plan of Care (Signed)
  Problem: Education: Goal: Knowledge of General Education information will improve Description: Including pain rating scale, medication(s)/side effects and non-pharmacologic comfort measures 03/22/2020 1647 by Meyer Cory, RN Outcome: Progressing 03/22/2020 1120 by Meyer Cory, RN Outcome: Progressing   Problem: Health Behavior/Discharge Planning: Goal: Ability to manage health-related needs will improve 03/22/2020 1647 by Meyer Cory, RN Outcome: Progressing 03/22/2020 1120 by Meyer Cory, RN Outcome: Progressing   Problem: Clinical Measurements: Goal: Ability to maintain clinical measurements within normal limits will improve 03/22/2020 1647 by Meyer Cory, RN Outcome: Progressing 03/22/2020 1120 by Meyer Cory, RN Outcome: Progressing Goal: Will remain free from infection 03/22/2020 1647 by Meyer Cory, RN Outcome: Progressing 03/22/2020 1120 by Meyer Cory, RN Outcome: Progressing Goal: Diagnostic test results will improve 03/22/2020 1647 by Meyer Cory, RN Outcome: Progressing 03/22/2020 1120 by Meyer Cory, RN Outcome: Progressing Goal: Respiratory complications will improve 03/22/2020 1647 by Meyer Cory, RN Outcome: Progressing 03/22/2020 1120 by Meyer Cory, RN Outcome: Progressing Goal: Cardiovascular complication will be avoided 03/22/2020 1647 by Meyer Cory, RN Outcome: Progressing 03/22/2020 1120 by Meyer Cory, RN Outcome: Progressing   Problem: Activity: Goal: Risk for activity intolerance will decrease 03/22/2020 1647 by Meyer Cory, RN Outcome: Progressing 03/22/2020 1120 by Meyer Cory, RN Outcome: Progressing   Problem: Nutrition: Goal: Adequate nutrition will be maintained 03/22/2020 1647 by Meyer Cory, RN Outcome: Progressing 03/22/2020 1120 by Meyer Cory, RN Outcome: Progressing   Problem: Coping: Goal: Level of anxiety will decrease 03/22/2020 1647 by Meyer Cory, RN Outcome: Progressing 03/22/2020 1120 by Meyer Cory, RN Outcome: Progressing   Problem: Elimination: Goal: Will not experience complications related to bowel motility 03/22/2020 1647 by Meyer Cory, RN Outcome: Progressing 03/22/2020 1120 by Meyer Cory, RN Outcome: Progressing Goal: Will not experience complications related to urinary retention 03/22/2020 1647 by Meyer Cory, RN Outcome: Progressing 03/22/2020 1120 by Meyer Cory, RN Outcome: Progressing   Problem: Pain Managment: Goal: General experience of comfort will improve 03/22/2020 1647 by Meyer Cory, RN Outcome: Progressing 03/22/2020 1120 by Meyer Cory, RN Outcome: Progressing   Problem: Safety: Goal: Ability to remain free from injury will improve 03/22/2020 1647 by Meyer Cory, RN Outcome: Progressing 03/22/2020 1120 by Meyer Cory, RN Outcome: Progressing   Problem: Skin Integrity: Goal: Risk for impaired skin integrity will decrease 03/22/2020 1647 by Meyer Cory, RN Outcome: Progressing 03/22/2020 1120 by Meyer Cory, RN Outcome: Progressing

## 2020-03-22 NOTE — ED Notes (Signed)
Pt c/o chest tightness (releated to breathing), NP [place] notified. Orders to give morphine and re-assess, See [facility]

## 2020-03-22 NOTE — Discharge Summary (Signed)
Physician Discharge Summary  Brett Fernandez WCB:[id number] DOB: September 15, 1961 DOA: 03/21/2020  PCP: Dortha Kern, MD  Admit date: 03/21/2020 Discharge date: 03/22/2020  Admitted From: home Disposition:  home  Recommendations for Outpatient Follow-up:  1. Follow up with PCP in 1-2 weeks 2. Please obtain BMP/CBC in one week  Home Health: Equipment/Devices:  Discharge Condition:stable CODE STATUS:full code Diet recommendation: heart healthy  Brief/Interim Summary: HPI: Brett Fernandez is a 59 y.o. male with medical history significant for COPD not on home oxygen, CHF, GERD, hypertension, hypothyroidism and coronary artery disease presents emergency department chief complaint shortness of breath and respiratory distress after multiple bee stings.  He reports he was trimming a tree ran into a "yellow jacket nest".  He reports multiple stings on bilateral upper extremities bilateral lower extremities and torso.  He began to feel "tightness in his throat" and experience shortness of breath with difficulty breathing.  He administered EpiPen to himself and called 911.  Associated symptoms include this in his legs and vision changes similar to "a dark curtain slowly being put over my face".  Denies chest pain palpitations headache dizziness syncope or near syncope.  Denies abdominal pain nausea vomiting diarrhea constipation.  Denies any dysuria hematuria frequency or urgency.  He does continue to smoke.  EMS arrived and he received a second EpiPen and states after that 1 I began to feel better.  In the emergency department oxygen saturation level 86% on room air, he had tachypnea and increased work of breathing.  Chest x-ray no evidence of acute cardiopulmonary abnormality, redemonstrated prominence of the interstitial lung markings greatest within the lung bases suspicious for possible underlying interstitial lung disease, emphysema with biapical bull, he was afebrile hemodynamically stable.  Provided  with a third EpiPen, Solu-Medrol, Pepcid, Benadryl as well as nebulizer and pain med.  He reports breathing much better after this but at the time of admission felt slight return of "tightness in throat". 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 Acute resp failure with hypoxia due to anaphylactic reaction to bee sting. Patient treated with epi pen, intravenous steroids, benadryl and bronchodilators. He has been weaned off oxygen and respiratory status appears to be back to baseline 2. COPD. No wheezing at this time. He will discharge with a prednisone taper for #1. Continue on bronchodilators 3. Tobacco abuse. Patient reports smoking 1.5-2ppd. Strongly advised to quit smoking. Provided nicotine patches 4. HTN. Stable on metoprolol, imdur 5. Chest pain. troponins unremarkable and no acute changes on EKG. Symptoms atypical. Unlikely related to cardiac etiology 6. Chronic pain syndrome/fibromyalgia. Continue home regimen 7. History of DVT. Continue anticoagulation with coumadin  Discharge Instructions  Discharge Instructions    Diet - low sodium heart healthy   Complete by: As directed    For home use only DME Nebulizer machine   Complete by: As directed    Patient needs a nebulizer to treat with  the following condition: COPD (chronic obstructive pulmonary disease) with emphysema (HCC)   Length of Need: Lifetime   Increase  activity slowly   Complete by: As directed      Allergies as of 03/22/2020      Reactions   Aspirin Other (See Comments)   Other Reaction: steven-johnson reaction [name]   Bee Venom Anaphylaxis   Sulfa Antibiotics Swelling   Other reaction(s): Unknown      Medication List    TAKE these medications   acetaminophen 325 MG tablet Commonly known as: TYLENOL Take 650 mg by mouth every 6 (six) hours as needed for mild pain, moderate pain or headache.   albuterol (2.5 MG/3ML) 0.083% nebulizer solution Commonly known as: PROVENTIL Take 3 mLs (2.5 mg total) by nebulization every 6 (six) hours as needed for wheezing or shortness of breath.   atomoxetine 40 MG capsule Commonly known as: STRATTERA Take 80 mg by mouth daily.   b complex vitamins tablet Take 1 tablet by mouth daily.   calamine lotion Apply 1 application topically as needed for itching.   CENTRUM SILVER 50+MEN PO Take 1 tablet by mouth daily.   Dulera 100-5 MCG/ACT Aero Generic drug: mometasone-formoterol Inhale 2 puffs into the lungs 2 (two) times daily.   DULoxetine 60 MG capsule Commonly known as: CYMBALTA Take 60 mg by mouth 2 (two) times daily.   EPINEPHrine 0.3 mg/0.3 mL Soaj injection Commonly known as: EPI-PEN Inject 0.3 mLs (0.3 mg total) into the muscle as needed for anaphylaxis.   finasteride 5 MG tablet Commonly known as: PROSCAR Take 5 mg by mouth daily.   fluticasone 50 MCG/ACT nasal spray Commonly known as: FLONASE Place 1 spray into both nostrils in the morning and at bedtime.   hydrocortisone cream 1 % Apply 1 application topically 2 (two) times daily as needed for itching.   Ipratropium-Albuterol 20-100 MCG/ACT Aers respimat Commonly known as: COMBIVENT Inhale 2 puffs into the lungs every 6 (six) hours.   isosorbide mononitrate 30 MG 24 hr tablet Commonly known as: IMDUR Take 1 tablet (30 mg total) by mouth daily.   levothyroxine 50 MCG tablet Commonly known as:  SYNTHROID Take 50 mcg by mouth every evening.   loratadine 10 MG tablet Commonly known as: CLARITIN Take 10 mg by mouth daily.   metoprolol succinate 50 MG 24 hr tablet Commonly known as: TOPROL-XL Take 50 mg by mouth daily.   mirtazapine 15 MG tablet Commonly known as: REMERON Take 15 mg by mouth daily.   Nebulizer System All-In-One Misc Use as directed for COPD   nicotine 21 mg/24hr patch Commonly known as: NICODERM CQ - dosed in mg/24 hours Place 1 patch (21 mg total) onto the skin daily.   nitroGLYCERIN 0.4 MG SL tablet Commonly known as: NITROSTAT Place 1 tablet (0.4 mg total) under the tongue every 5 (five) minutes as needed for chest pain.   omeprazole 40 MG capsule Commonly known as: PRILOSEC Take 40 mg by mouth daily.   ondansetron 4 MG tablet Commonly known as: ZOFRAN Take 1 tablet (4 mg total) by mouth every 6 (six) hours as needed for nausea.   oxyCODONE 5 MG immediate release tablet Commonly known as: Oxy IR/ROXICODONE Take 1 tablet (5 mg total) by mouth every 6 (six) hours as needed for severe pain.   polyethylene glycol 17 g packet Commonly known as: MIRALAX / GLYCOLAX Take 17 g by mouth daily as needed.   potassium citrate 10 MEQ (1080 MG) SR tablet  Commonly known as: UROCIT-K Take 20 mEq by mouth 2 (two) times daily.   pravastatin 80 MG tablet Commonly known as: PRAVACHOL Take 80 mg by mouth at bedtime.   predniSONE 10 MG tablet Commonly known as: DELTASONE Take 40mg  po daily for 2 days then 30mg  daily for 2 days then 20mg  daily for 2 days then 10mg  daily for 2 days then stop   pregabalin 150 MG capsule Commonly known as: LYRICA Take 300 mg by mouth 2 (two) times daily. What changed: Another medication with the same name was removed. Continue taking this medication, and follow the directions you see here.   tamsulosin 0.4 MG Caps capsule Commonly known as: FLOMAX Take 0.4 mg by mouth daily.   traZODone 100 MG tablet Commonly known as:  DESYREL Take 200-300 mg by mouth at bedtime.   warfarin 4 MG tablet Commonly known as: COUMADIN Take 4 mg by mouth every evening.   ziprasidone 60 MG capsule Commonly known as: GEODON Take 60 mg by mouth 2 (two) times daily with a meal.            Durable Medical Equipment  (From admission, onward)         Start     Ordered   03/22/20 0000  For home use only DME Nebulizer machine       Question Answer Comment  Patient needs a nebulizer to treat with the following condition COPD (chronic obstructive pulmonary disease) with emphysema (HCC)   Length of Need Lifetime      03/22/20 1611          Allergies  Allergen Reactions  . Aspirin Other (See Comments)    Other Reaction: steven-johnson reaction Syndrome  . Bee Venom Anaphylaxis  . Sulfa Antibiotics Swelling    Other reaction(s): Unknown    Consultations:     Procedures/Studies: DG Chest 2 View  Result Date: 03/04/2020 CLINICAL DATA:  Chest pain EXAM: CHEST - 2 VIEW COMPARISON:  August 16, 2019 FINDINGS: The heart size and mediastinal contours are within normal limits. Hyperinflation of the upper lung zones is again noted. Aortic knob calcifications. Both lungs are clear. Healing posterior right ninth and tenth rib fractures are seen. IMPRESSION: No active cardiopulmonary disease. Electronically Signed   By: 05/23/20 M.D.   On: 03/04/2020 02:19   CT ANGIO CHEST PE W OR WO CONTRAST  Result Date: 03/04/2020 CLINICAL DATA:  Short of breath, chest pain EXAM: CT ANGIOGRAPHY CHEST WITH CONTRAST TECHNIQUE: Multidetector CT imaging of the chest was performed using the standard protocol during bolus administration of intravenous contrast. Multiplanar CT image reconstructions and MIPs were obtained to evaluate the vascular anatomy. CONTRAST:  39mL OMNIPAQUE IOHEXOL 350 MG/ML SOLN COMPARISON:  None. FINDINGS: Cardiovascular: No filling defects within the pulmonary arteries to suggest acute pulmonary  embolism. Mediastinum/Nodes: No axillary supraclavicular adenopathy no mediastinal hilar adenopathy no pericardial effusion. Esophagus normal. Lungs/Pleura: Paraseptal emphysema the upper lobes. Centrilobular emphysema in the upper lobes. No pulmonary infarction. There is atelectasis in the lower lobes. Subpleural cystic change in the lower lobes. No suspicious nodularity. Upper Abdomen: Limited view of the liver, kidneys, pancreas are unremarkable. Normal adrenal glands. Musculoskeletal: No aggressive osseous lesion. Review of the MIP images confirms the above findings. IMPRESSION: 1. No evidence acute pulmonary embolism. 2. Interstitial lung disease with paraseptal emphysema, centrilobular emphysema, and subpleural cystic change. 1. Electronically Signed   By: Jonna Clark M.D.   On: 03/04/2020 08:10   NM Myocar Multi W/Spect  W/Wall Motion / EF  Result Date: 03/05/2020  There was no ST segment deviation noted during stress.  No T wave inversion was noted during stress.  The study is normal.  This is a low risk study.  The left ventricular ejection fraction is hyperdynamic (>65%).  Nuclear stress EF: 67%.    DG Chest Portable 1 View  Result Date: 03/21/2020 CLINICAL DATA:  Shortness of breath. EXAM: PORTABLE CHEST 1 VIEW COMPARISON:  Prior chest CT 03/04/2020, chest radiograph 03/04/2020 FINDINGS: Heart size within normal limits. Aortic atherosclerosis. Redemonstrated prominence of the interstitial lung markings greatest within the lung bases suspicious for possible underlying interstitial lung disease. Emphysema with biapical bulla. No appreciable airspace consolidation. No frank pulmonary edema. No evidence of pleural effusion or pneumothorax. IMPRESSION: No evidence of acute cardiopulmonary abnormality. Redemonstrated prominence of the interstitial lung markings greatest within the lung bases suspicious for possible underlying interstitial lung disease. Emphysema with biapical bulla. Aortic  Atherosclerosis (ICD10-I70.0). Electronically Signed   By: Jackey Loge DO   On: 03/21/2020 10:53      Subjective: Had some upper abdominal discomfort, worse with deep inspiration, improved with morphine  Discharge Exam: Vitals:   03/22/20 0118 03/22/20 0354 03/22/20 0756 03/22/20 1550  BP: 136/86 (!) 158/96 (!) 127/91 (!) 135/94  Pulse: 81 91 85 85  Resp: 20 20 18 18   Temp: 97.6 F (36.4 C) 97.6 F (36.4 C) 97.6 F (36.4 C) 97.6 F (36.4 C)  TempSrc: Oral Oral Oral Oral  SpO2: 95% 94% 94% 96%  Weight:      Height:        General: Pt is alert, awake, not in acute distress Cardiovascular: RRR, S1/S2 +, no rubs, no gallops Respiratory: CTA bilaterally, no wheezing, no rhonchi Abdominal: Soft, NT, ND, bowel sounds + Extremities: no edema, no cyanosis    The results of significant diagnostics from this hospitalization (including imaging, microbiology, ancillary and laboratory) are listed below for reference.     Microbiology: Recent Results (from the past 240 hour(s))  SARS Coronavirus 2 by RT PCR (hospital order, performed in [facility] hospital lab) Nasopharyngeal Nasopharyngeal Swab     Status: None   Collection Time: 03/21/20 10:48 AM   Specimen: Nasopharyngeal Swab  Result Value Ref Range Status   SARS Coronavirus 2 NEGATIVE NEGATIVE Final    Comment: (NOTE) SARS-CoV-2 target nucleic acids are NOT DETECTED.  The SARS-CoV-2 RNA is generally detectable in upper and lower respiratory specimens during the acute phase of infection. The lowest concentration of SARS-CoV-2 viral copies this assay can detect is 250 copies / mL. A negative result does not preclude SARS-CoV-2 infection and should not be used as the sole basis for treatment or other patient management decisions.  A negative result may occur with improper specimen collection / handling, submission of specimen other than nasopharyngeal swab, presence of viral mutation(s) within the areas targeted by this  assay, and inadequate number of viral copies (<250 copies / mL). A negative result must be combined with clinical observations, patient history, and epidemiological information.  Fact Sheet for Patients:   BoilerBrush.com.cy  Fact Sheet for Healthcare Providers: https://pope.com/  This test is not yet approved or  cleared by the [place] FDA and has been authorized for detection and/or diagnosis of SARS-CoV-2 by FDA under an Emergency Use Authorization (EUA).  This EUA will remain in effect (meaning this test can be used) for the duration of the COVID-19 declaration under Section 564(b)(1) of the Act, 21 U.S.C. section  360bbb-3(b)(1), unless the authorization is terminated or revoked sooner.  Performed at [facility], [address] [place], [place] [address]      Labs: BNP (last 3 results) No results for input(s): BNP in the last 8760 hours. Basic Metabolic Panel: Recent Labs  Lab 03/21/20 1048 03/22/20 0520  NA 138 139  K 3.4* 4.2  CL 100 106  CO2 25 24  GLUCOSE 159* 144*  BUN 13 15  CREATININE 1.19 0.98  CALCIUM 8.9 9.1   Liver Function Tests: Recent Labs  Lab 03/21/20 1048  AST 23  ALT 20  ALKPHOS 88  BILITOT 0.7  PROT 7.2  ALBUMIN 4.1   No results for input(s): LIPASE, AMYLASE in the last 168 hours. No results for input(s): AMMONIA in the last 168 hours. CBC: Recent Labs  Lab 03/21/20 1048 03/22/20 0520  WBC 11.8* 15.7*  NEUTROABS 6.7  --   HGB 14.9 14.5  HCT 42.4 40.5  MCV 93.6 92.5  PLT 225 220   Cardiac Enzymes: No results for input(s): CKTOTAL, CKMB, CKMBINDEX, TROPONINI in the last 168 hours. BNP: Invalid input(s): POCBNP CBG: No results for input(s): GLUCAP in the last 168 hours. D-Dimer No results for input(s): DDIMER in the last 72 hours. Hgb A1c No results for input(s): HGBA1C in the last 72 hours. Lipid Profile No results for input(s): CHOL, HDL, LDLCALC, TRIG,  CHOLHDL, LDLDIRECT in the last 72 hours. Thyroid function studies No results for input(s): TSH, T4TOTAL, T3FREE, THYROIDAB in the last 72 hours.  Invalid input(s): FREET3 Anemia work up No results for input(s): VITAMINB12, FOLATE, FERRITIN, TIBC, IRON, RETICCTPCT in the last 72 hours. Urinalysis    Component Value Date/Time   COLORURINE YELLOW (A) 09/09/2019 1135   APPEARANCEUR CLEAR (A) 09/09/2019 1135   APPEARANCEUR Clear 10/05/2014 1436   LABSPEC 1.012 09/09/2019 1135   LABSPEC 1.009 10/05/2014 1436   PHURINE 6.0 09/09/2019 1135   GLUCOSEU NEGATIVE 09/09/2019 1135   GLUCOSEU Negative 10/05/2014 1436   HGBUR NEGATIVE 09/09/2019 1135   BILIRUBINUR NEGATIVE 09/09/2019 1135   BILIRUBINUR Negative 10/05/2014 1436   KETONESUR 80 (A) 09/09/2019 1135   PROTEINUR 30 (A) 09/09/2019 1135   NITRITE NEGATIVE 09/09/2019 1135   LEUKOCYTESUR NEGATIVE 09/09/2019 1135   LEUKOCYTESUR Negative 10/05/2014 1436   Sepsis Labs Invalid input(s): PROCALCITONIN,  WBC,  LACTICIDVEN Microbiology Recent Results (from the past 240 hour(s))  SARS Coronavirus 2 by RT PCR (hospital order, performed in [facility] hospital lab) Nasopharyngeal Nasopharyngeal Swab     Status: None   Collection Time: 03/21/20 10:48 AM   Specimen: Nasopharyngeal Swab  Result Value Ref Range Status   SARS Coronavirus 2 NEGATIVE NEGATIVE Final    Comment: (NOTE) SARS-CoV-2 target nucleic acids are NOT DETECTED.  The SARS-CoV-2 RNA is generally detectable in upper and lower respiratory specimens during the acute phase of infection. The lowest concentration of SARS-CoV-2 viral copies this assay can detect is 250 copies / mL. A negative result does not preclude SARS-CoV-2 infection and should not be used as the sole basis for treatment or other patient management decisions.  A negative result may occur with improper specimen collection / handling, submission of specimen other than nasopharyngeal swab, presence of viral  mutation(s) within the areas targeted by this assay, and inadequate number of viral copies (<250 copies / mL). A negative result must be combined with clinical observations, patient history, and epidemiological information.  Fact Sheet for Patients:   BoilerBrush.com.cy  Fact Sheet for Healthcare Providers: https://pope.com/  This test is not yet approved or  cleared by the [place] and has been authorized for detection and/or diagnosis of SARS-CoV-2 by FDA under an Emergency Use Authorization (EUA).  This EUA will remain in effect (meaning this test can be used) for the duration of the COVID-19 declaration under Section 564(b)(1) of the Act, 21 U.S.C. section 360bbb-3(b)(1), unless the authorization is terminated or revoked sooner.  Performed at [facility], [address] [place], [place] [address]      Time coordinating discharge:  SIGNED:   Erick Blinks, MD  [facility] 03/22/2020, 7:44 PM   If 7PM-7AM, please contact night-coverage [email or web address]

## 2020-03-22 NOTE — Plan of Care (Signed)
  Problem: Education: Goal: Knowledge of General Education information will improve Description Including pain rating scale, medication(s)/side effects and non-pharmacologic comfort measures Outcome: Progressing   

## 2021-01-18 ENCOUNTER — Other Ambulatory Visit: Payer: Self-pay | Admitting: Internal Medicine

## 2021-01-18 DIAGNOSIS — R079 Chest pain, unspecified: Secondary | ICD-10-CM

## 2021-01-22 ENCOUNTER — Emergency Department: Payer: Medicaid Other

## 2021-01-22 ENCOUNTER — Other Ambulatory Visit: Payer: Self-pay

## 2021-01-22 ENCOUNTER — Emergency Department
Admission: EM | Admit: 2021-01-22 | Discharge: 2021-01-22 | Disposition: A | Discharge status: Home / Self Care | Payer: Medicaid Other | Attending: Emergency Medicine | Admitting: Emergency Medicine

## 2021-01-22 DIAGNOSIS — K5900 Constipation, unspecified: Secondary | ICD-10-CM | POA: Diagnosis not present

## 2021-01-22 DIAGNOSIS — I110 Hypertensive heart disease with heart failure: Secondary | ICD-10-CM | POA: Insufficient documentation

## 2021-01-22 DIAGNOSIS — J441 Chronic obstructive pulmonary disease with (acute) exacerbation: Secondary | ICD-10-CM | POA: Insufficient documentation

## 2021-01-22 DIAGNOSIS — R059 Cough, unspecified: Secondary | ICD-10-CM | POA: Diagnosis not present

## 2021-01-22 DIAGNOSIS — Z96651 Presence of right artificial knee joint: Secondary | ICD-10-CM | POA: Insufficient documentation

## 2021-01-22 DIAGNOSIS — I509 Heart failure, unspecified: Secondary | ICD-10-CM | POA: Diagnosis not present

## 2021-01-22 DIAGNOSIS — Z79899 Other long term (current) drug therapy: Secondary | ICD-10-CM | POA: Diagnosis not present

## 2021-01-22 DIAGNOSIS — Z20822 Contact with and (suspected) exposure to covid-19: Secondary | ICD-10-CM | POA: Diagnosis not present

## 2021-01-22 DIAGNOSIS — R112 Nausea with vomiting, unspecified: Secondary | ICD-10-CM | POA: Insufficient documentation

## 2021-01-22 DIAGNOSIS — J449 Chronic obstructive pulmonary disease, unspecified: Secondary | ICD-10-CM

## 2021-01-22 DIAGNOSIS — Z7901 Long term (current) use of anticoagulants: Secondary | ICD-10-CM | POA: Diagnosis not present

## 2021-01-22 DIAGNOSIS — R0789 Other chest pain: Secondary | ICD-10-CM | POA: Diagnosis not present

## 2021-01-22 DIAGNOSIS — F17210 Nicotine dependence, cigarettes, uncomplicated: Secondary | ICD-10-CM | POA: Insufficient documentation

## 2021-01-22 DIAGNOSIS — R0600 Dyspnea, unspecified: Secondary | ICD-10-CM | POA: Diagnosis not present

## 2021-01-22 DIAGNOSIS — E039 Hypothyroidism, unspecified: Secondary | ICD-10-CM | POA: Diagnosis not present

## 2021-01-22 DIAGNOSIS — Z7951 Long term (current) use of inhaled steroids: Secondary | ICD-10-CM | POA: Diagnosis not present

## 2021-01-22 DIAGNOSIS — I2510 Atherosclerotic heart disease of native coronary artery without angina pectoris: Secondary | ICD-10-CM | POA: Insufficient documentation

## 2021-01-22 DIAGNOSIS — R1084 Generalized abdominal pain: Secondary | ICD-10-CM | POA: Diagnosis present

## 2021-01-22 LAB — COMPREHENSIVE METABOLIC PANEL
ALT: 27 U/L (ref 0–44)
AST: 26 U/L (ref 15–41)
Albumin: 3.9 g/dL (ref 3.5–5.0)
Alkaline Phosphatase: 83 U/L (ref 38–126)
Anion gap: 10 (ref 5–15)
BUN: 18 mg/dL (ref 6–20)
CO2: 26 mmol/L (ref 22–32)
Calcium: 8.9 mg/dL (ref 8.9–10.3)
Chloride: 98 mmol/L (ref 98–111)
Creatinine, Ser: 0.95 mg/dL (ref 0.61–1.24)
GFR, Estimated: >60 mL/min (ref >60)
Glucose, Bld: 102 mg/dL — ABNORMAL HIGH (ref 70–99)
Potassium: 4.3 mmol/L (ref 3.5–5.1)
Sodium: 134 mmol/L — ABNORMAL LOW (ref 135–145)
Total Bilirubin: 0.7 mg/dL (ref 0.3–1.2)
Total Protein: 6.8 g/dL (ref 6.5–8.1)

## 2021-01-22 LAB — CBC
HCT: 48.4 % (ref 39.0–52.0)
Hemoglobin: 16 g/dL (ref 13.0–17.0)
MCH: 31.9 pg (ref 26.0–34.0)
MCHC: 33.1 g/dL (ref 30.0–36.0)
MCV: 96.6 fL (ref 80.0–100.0)
Platelets: 196 10*3/uL (ref 150–400)
RBC: 5.01 MIL/uL (ref 4.22–5.81)
RDW: 14.1 % (ref 11.5–15.5)
WBC: 11.4 10*3/uL — ABNORMAL HIGH (ref 4.0–10.5)
nRBC: 0 % (ref 0.0–0.2)

## 2021-01-22 LAB — URINALYSIS, COMPLETE (UACMP) WITH MICROSCOPIC
Bacteria, UA: NONE SEEN
Bilirubin Urine: NEGATIVE
Glucose, UA: NEGATIVE mg/dL
Hgb urine dipstick: NEGATIVE
Ketones, ur: 5 mg/dL — AB
Leukocytes,Ua: NEGATIVE
Nitrite: NEGATIVE
Protein, ur: NEGATIVE mg/dL
Specific Gravity, Urine: 1.011 (ref 1.005–1.030)
pH: 6 (ref 5.0–8.0)

## 2021-01-22 LAB — RESP PANEL BY RT-PCR (FLU A&B, COVID) ARPGX2
Influenza A by PCR: NEGATIVE
Influenza B by PCR: NEGATIVE
SARS Coronavirus 2 by RT PCR: NEGATIVE

## 2021-01-22 LAB — BRAIN NATRIURETIC PEPTIDE: B Natriuretic Peptide: 18.2 pg/mL (ref 0.0–100.0)

## 2021-01-22 LAB — PROCALCITONIN: Procalcitonin: <0.1 ng/mL

## 2021-01-22 LAB — TROPONIN I (HIGH SENSITIVITY): Troponin I (High Sensitivity): 10 ng/L (ref <18)

## 2021-01-22 LAB — LIPASE, BLOOD: Lipase: 40 U/L (ref 11–51)

## 2021-01-22 MED ORDER — IPRATROPIUM-ALBUTEROL 0.5-2.5 (3) MG/3ML IN SOLN
3.0000 mL | Freq: Once | RESPIRATORY_TRACT | Status: AC
  Administered 2021-01-22: 3 mL via RESPIRATORY_TRACT

## 2021-01-22 MED ORDER — POLYETHYLENE GLYCOL 3350 17 G PO PACK: 17.0000 g | PACK | Freq: Every day | ORAL | 0 refills | Status: DC

## 2021-01-22 MED ORDER — DICYCLOMINE HCL 10 MG PO CAPS: 10.0000 mg | ORAL_CAPSULE | Freq: Three times a day (TID) | ORAL | 0 refills | Status: DC | PRN

## 2021-01-22 MED ORDER — PREDNISONE 10 MG (21) PO TBPK: ORAL_TABLET | ORAL | 0 refills | Status: DC

## 2021-01-22 MED ORDER — IPRATROPIUM-ALBUTEROL 0.5-2.5 (3) MG/3ML IN SOLN
3.0000 mL | Freq: Once | RESPIRATORY_TRACT | Status: AC
  Administered 2021-01-22: 3 mL via RESPIRATORY_TRACT
  Filled 2021-01-22: qty 9

## 2021-01-22 NOTE — ED Triage Notes (Signed)
Pt arrives via pov from home. Pt reports possible GI blockage, nausea w/out emesis, unable to have bowel movement x 5 days. Pt states tried multiple laxitives at home without relief. Hx of illus not working correctly, previous blockage that required surgery. NAD noted at this time. When standing pt lightheaded and unsteady on feet when coming into facility.

## 2021-01-22 NOTE — Discharge Instructions (Addendum)
As we discussed please talk to your primary care provider about obtaining a CT scan of your chest to further evaluate the nodule seen on CXR today. Please seek medical attention for any high fevers, chest pain, shortness of breath, change in behavior, persistent vomiting, bloody stool or any other new or concerning symptoms.
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
## 2021-01-22 NOTE — ED Notes (Signed)
Pt verbalized understanding of d/c instructions at this time. Prescriptions and follow-up care reviewed at this time. Pt given opportunity to ask questions as needed. Pt ambulatory to ED lobby to wait for ride, NAD noted, RR even and unlabored, steady gait noted.

## 2021-01-22 NOTE — ED Provider Notes (Signed)
CT scan without any concerning findings. Did discuss pulmonary nodule seen on chest x-ray with patient. Did have a couple episodes of low o2 readings here. Does have history of COPD and on my exam had some wheezing in the lower lungs. Was given breathing treatments which he states helped his breathing and his oxygen levels did improve. Will plan on discharging with prescription for steroid and bentyl. Patient also requested prescription for laxative.    Phineas Semen, MD 01/22/21 [phone]

## 2021-02-04 ENCOUNTER — Ambulatory Visit: Admission: RE | Admit: 2021-02-04 | Payer: Medicaid Other | Source: Ambulatory Visit

## 2021-02-05 ENCOUNTER — Other Ambulatory Visit (HOSPITAL_COMMUNITY): Payer: Self-pay | Admitting: Emergency Medicine

## 2021-02-05 DIAGNOSIS — I200 Unstable angina: Secondary | ICD-10-CM

## 2021-02-05 MED ORDER — METOPROLOL TARTRATE 100 MG PO TABS: 100.0000 mg | ORAL_TABLET | Freq: Once | ORAL | 0 refills | Status: DC

## 2021-02-05 NOTE — Progress Notes (Addendum)
Attempted to call patient regarding upcoming cardiac CT appointment. Left message on voicemail with name and callback number Rockwell Alexandria RN Navigator Cardiac Imaging [facility] [phone] Office [phone] Cell  Left detailed message about one time dose 100mg  metoprolol tartrate sent to his pharmacy on file for HR control for CCTA.

## 2021-02-07 ENCOUNTER — Ambulatory Visit
Admission: RE | Admit: 2021-02-07 | Discharge: 2021-02-07 | Disposition: A | Discharge status: Home / Self Care | Payer: Medicaid Other | Source: Ambulatory Visit | Attending: Internal Medicine | Admitting: Internal Medicine

## 2021-02-07 ENCOUNTER — Other Ambulatory Visit: Payer: Self-pay

## 2021-02-07 DIAGNOSIS — R079 Chest pain, unspecified: Secondary | ICD-10-CM

## 2021-02-07 NOTE — Progress Notes (Signed)
Unable to scan this patient due to HR being 101.  Per patient he didn't receive instructions to take medication prior to coming for scan. See VS.

## 2021-02-08 ENCOUNTER — Telehealth (HOSPITAL_COMMUNITY): Payer: Self-pay | Admitting: Emergency Medicine

## 2021-02-08 NOTE — Telephone Encounter (Signed)
Attempted to call patient regarding upcoming cardiac CT appointment. °Left message on voicemail with name and callback number °Dmitry Macomber RN Navigator Cardiac Imaging °[facility] °[phone] Office °[phone] Cell ° °

## 2021-02-20 ENCOUNTER — Telehealth (HOSPITAL_COMMUNITY): Payer: Self-pay | Admitting: Emergency Medicine

## 2021-02-20 NOTE — Telephone Encounter (Signed)
Reaching out to patient to offer assistance regarding upcoming cardiac imaging study; pt verbalizes understanding of appt date/time, parking situation and where to check in, pre-test NPO status and medications ordered, and verified current allergies; name and call back number provided for further questions should they arise Jonah Nestle RN Navigator Cardiac Imaging [facility] [phone] office [phone] cell  100mg metoprolol 2 hr prior to scan Ronda Rajkumar  

## 2021-02-21 ENCOUNTER — Other Ambulatory Visit: Payer: Self-pay

## 2021-02-21 ENCOUNTER — Ambulatory Visit
Admission: RE | Admit: 2021-02-21 | Discharge: 2021-02-21 | Disposition: A | Discharge status: Home / Self Care | Payer: Medicaid Other | Source: Ambulatory Visit | Attending: Internal Medicine | Admitting: Internal Medicine

## 2021-02-21 ENCOUNTER — Other Ambulatory Visit (HOSPITAL_COMMUNITY): Payer: Self-pay | Admitting: Emergency Medicine

## 2021-02-21 DIAGNOSIS — R931 Abnormal findings on diagnostic imaging of heart and coronary circulation: Secondary | ICD-10-CM

## 2021-02-21 DIAGNOSIS — I2510 Atherosclerotic heart disease of native coronary artery without angina pectoris: Secondary | ICD-10-CM

## 2021-02-21 DIAGNOSIS — R079 Chest pain, unspecified: Secondary | ICD-10-CM | POA: Insufficient documentation

## 2021-02-21 DIAGNOSIS — I200 Unstable angina: Secondary | ICD-10-CM

## 2021-02-21 MED ORDER — METOPROLOL TARTRATE 5 MG/5ML IV SOLN
10.0000 mg | Freq: Once | INTRAVENOUS | Status: AC
  Administered 2021-02-21: 10 mg via INTRAVENOUS

## 2021-02-21 MED ORDER — IOHEXOL 350 MG/ML SOLN
80.0000 mL | Freq: Once | INTRAVENOUS | Status: AC | PRN
  Administered 2021-02-21: 85 mL via INTRAVENOUS

## 2021-02-21 MED ORDER — NITROGLYCERIN 0.4 MG SL SUBL
0.8000 mg | SUBLINGUAL_TABLET | Freq: Once | SUBLINGUAL | Status: AC
  Administered 2021-02-21: 0.8 mg via SUBLINGUAL

## 2021-02-21 MED ORDER — METOPROLOL TARTRATE 100 MG PO TABS: 100.0000 mg | ORAL_TABLET | Freq: Once | ORAL | 0 refills | Status: DC

## 2021-02-21 NOTE — Progress Notes (Signed)
Patient tolerated CT well. Drank water and coffee after.  Vital signs stable encourage to drink water throughout day.Reasons explained and verbalized understanding. Ambulated steady gait.  

## 2021-02-25 ENCOUNTER — Other Ambulatory Visit: Payer: Self-pay | Admitting: Internal Medicine

## 2021-02-25 DIAGNOSIS — R931 Abnormal findings on diagnostic imaging of heart and coronary circulation: Secondary | ICD-10-CM

## 2021-02-26 DIAGNOSIS — I2510 Atherosclerotic heart disease of native coronary artery without angina pectoris: Secondary | ICD-10-CM | POA: Diagnosis not present

## 2021-02-26 DIAGNOSIS — R931 Abnormal findings on diagnostic imaging of heart and coronary circulation: Secondary | ICD-10-CM | POA: Diagnosis not present

## 2021-03-01 ENCOUNTER — Other Ambulatory Visit
Admission: RE | Admit: 2021-03-01 | Discharge: 2021-03-01 | Disposition: A | Discharge status: Home / Self Care | Payer: Medicaid Other | Source: Ambulatory Visit | Attending: Student | Admitting: Student

## 2021-03-01 DIAGNOSIS — Z01818 Encounter for other preprocedural examination: Secondary | ICD-10-CM | POA: Diagnosis present

## 2021-03-01 LAB — BRAIN NATRIURETIC PEPTIDE: B Natriuretic Peptide: 38.7 pg/mL (ref 0.0–100.0)

## 2021-03-21 ENCOUNTER — Other Ambulatory Visit: Payer: Self-pay

## 2021-03-21 ENCOUNTER — Observation Stay
Admission: RE | Admit: 2021-03-21 | Discharge: 2021-03-22 | Disposition: A | Discharge status: Home / Self Care | Payer: Medicaid Other | Attending: Internal Medicine | Admitting: Internal Medicine

## 2021-03-21 ENCOUNTER — Encounter: Payer: Self-pay | Admitting: Internal Medicine

## 2021-03-21 ENCOUNTER — Encounter
Admission: RE | Disposition: A | Discharge status: Home / Self Care | Payer: Self-pay | Source: Home / Self Care | Attending: Internal Medicine

## 2021-03-21 DIAGNOSIS — I25110 Atherosclerotic heart disease of native coronary artery with unstable angina pectoris: Secondary | ICD-10-CM | POA: Diagnosis not present

## 2021-03-21 DIAGNOSIS — Z955 Presence of coronary angioplasty implant and graft: Secondary | ICD-10-CM

## 2021-03-21 DIAGNOSIS — R079 Chest pain, unspecified: Secondary | ICD-10-CM

## 2021-03-21 DIAGNOSIS — R0602 Shortness of breath: Secondary | ICD-10-CM

## 2021-03-21 DIAGNOSIS — I200 Unstable angina: Secondary | ICD-10-CM | POA: Diagnosis present

## 2021-03-21 HISTORY — PX: LEFT HEART CATH AND CORONARY ANGIOGRAPHY: CATH118249

## 2021-03-21 HISTORY — PX: CORONARY STENT INTERVENTION: CATH118234

## 2021-03-21 LAB — CBC
HCT: 43.2 % (ref 39.0–52.0)
Hemoglobin: 14.3 g/dL (ref 13.0–17.0)
MCH: 32.8 pg (ref 26.0–34.0)
MCHC: 33.1 g/dL (ref 30.0–36.0)
MCV: 99.1 fL (ref 80.0–100.0)
Platelets: 185 10*3/uL (ref 150–400)
RBC: 4.36 MIL/uL (ref 4.22–5.81)
RDW: 13.8 % (ref 11.5–15.5)
WBC: 9.6 10*3/uL (ref 4.0–10.5)
nRBC: 0 % (ref 0.0–0.2)

## 2021-03-21 LAB — POCT ACTIVATED CLOTTING TIME
Activated Clotting Time: 214 seconds
Activated Clotting Time: 306 seconds
Activated Clotting Time: 439 seconds

## 2021-03-21 LAB — TROPONIN I (HIGH SENSITIVITY)
Troponin I (High Sensitivity): 157 ng/L (ref <18)
Troponin I (High Sensitivity): 173 ng/L (ref <18)

## 2021-03-21 SURGERY — LEFT HEART CATH AND CORONARY ANGIOGRAPHY
Anesthesia: Moderate Sedation

## 2021-03-21 MED ORDER — OXYCODONE HCL 5 MG PO TABS: 5.0000 mg | ORAL_TABLET | Freq: Four times a day (QID) | ORAL | Status: DC | PRN

## 2021-03-21 MED ORDER — HYDRALAZINE HCL 20 MG/ML IJ SOLN: 10.0000 mg | INTRAMUSCULAR | Status: AC | PRN

## 2021-03-21 MED ORDER — SODIUM CHLORIDE 0.9 % WEIGHT BASED INFUSION: 1.0000 mL/kg/h | INTRAVENOUS | Status: AC

## 2021-03-21 MED ORDER — HEPARIN SODIUM (PORCINE) 1000 UNIT/ML IJ SOLN
INTRAMUSCULAR | Status: AC
  Filled 2021-03-21: qty 1

## 2021-03-21 MED ORDER — ACETAMINOPHEN 325 MG PO TABS: 650.0000 mg | ORAL_TABLET | Freq: Four times a day (QID) | ORAL | Status: DC | PRN

## 2021-03-21 MED ORDER — NICOTINE 21 MG/24HR TD PT24
21.0000 mg | MEDICATED_PATCH | Freq: Every day | TRANSDERMAL | Status: DC
  Administered 2021-03-21 – 2021-03-22 (×2): 21 mg via TRANSDERMAL
  Filled 2021-03-21 (×3): qty 1

## 2021-03-21 MED ORDER — HYDROCORTISONE 1 % EX CREA
1.0000 "application " | TOPICAL_CREAM | Freq: Two times a day (BID) | CUTANEOUS | Status: DC | PRN
  Filled 2021-03-21: qty 28

## 2021-03-21 MED ORDER — SODIUM CHLORIDE 0.9% FLUSH
3.0000 mL | Freq: Two times a day (BID) | INTRAVENOUS | Status: DC
  Administered 2021-03-22: 3 mL via INTRAVENOUS

## 2021-03-21 MED ORDER — HEPARIN (PORCINE) IN NACL 2000-0.9 UNIT/L-% IV SOLN
INTRAVENOUS | Status: DC | PRN
  Administered 2021-03-21: 1000 mL

## 2021-03-21 MED ORDER — ZINC GLUCONATE 50 MG PO TABS: 50.0000 mg | ORAL_TABLET | Freq: Every day | ORAL | Status: DC

## 2021-03-21 MED ORDER — LIDOCAINE HCL (PF) 1 % IJ SOLN
INTRAMUSCULAR | Status: AC
  Filled 2021-03-21: qty 30

## 2021-03-21 MED ORDER — TIROFIBAN (AGGRASTAT) BOLUS VIA INFUSION
INTRAVENOUS | Status: DC | PRN
  Administered 2021-03-21: 2415 ug via INTRAVENOUS

## 2021-03-21 MED ORDER — HEPARIN (PORCINE) IN NACL 1000-0.9 UT/500ML-% IV SOLN
INTRAVENOUS | Status: AC
  Filled 2021-03-21: qty 1000

## 2021-03-21 MED ORDER — MIDAZOLAM HCL 2 MG/2ML IJ SOLN
INTRAMUSCULAR | Status: AC
  Filled 2021-03-21: qty 2

## 2021-03-21 MED ORDER — PANTOPRAZOLE SODIUM 40 MG PO TBEC
40.0000 mg | DELAYED_RELEASE_TABLET | Freq: Every day | ORAL | Status: DC
  Administered 2021-03-22: 40 mg via ORAL
  Filled 2021-03-21: qty 1

## 2021-03-21 MED ORDER — LEVOTHYROXINE SODIUM 50 MCG PO TABS
50.0000 ug | ORAL_TABLET | Freq: Every day | ORAL | Status: DC
  Administered 2021-03-22: 50 ug via ORAL
  Filled 2021-03-21: qty 1

## 2021-03-21 MED ORDER — LABETALOL HCL 5 MG/ML IV SOLN: 10.0000 mg | INTRAVENOUS | Status: AC | PRN

## 2021-03-21 MED ORDER — ISOSORBIDE MONONITRATE ER 30 MG PO TB24
30.0000 mg | ORAL_TABLET | Freq: Every day | ORAL | Status: DC
  Administered 2021-03-21 – 2021-03-22 (×2): 30 mg via ORAL
  Filled 2021-03-21 (×2): qty 1

## 2021-03-21 MED ORDER — FENTANYL CITRATE (PF) 100 MCG/2ML IJ SOLN
INTRAMUSCULAR | Status: AC
  Filled 2021-03-21: qty 2

## 2021-03-21 MED ORDER — TAMSULOSIN HCL 0.4 MG PO CAPS
0.8000 mg | ORAL_CAPSULE | Freq: Every day | ORAL | Status: DC
  Administered 2021-03-22: 0.8 mg via ORAL
  Filled 2021-03-21: qty 2

## 2021-03-21 MED ORDER — TICAGRELOR 90 MG PO TABS
ORAL_TABLET | ORAL | Status: DC | PRN
  Administered 2021-03-21: 180 mg via ORAL

## 2021-03-21 MED ORDER — SODIUM CHLORIDE 0.9 % WEIGHT BASED INFUSION
1.0000 mL/kg/h | INTRAVENOUS | Status: DC
  Administered 2021-03-21: 1 mL/kg/h via INTRAVENOUS

## 2021-03-21 MED ORDER — IPRATROPIUM-ALBUTEROL 20-100 MCG/ACT IN AERS: 2.0000 | INHALATION_SPRAY | Freq: Four times a day (QID) | RESPIRATORY_TRACT | Status: DC

## 2021-03-21 MED ORDER — HEPARIN SODIUM (PORCINE) 1000 UNIT/ML IJ SOLN
INTRAMUSCULAR | Status: DC | PRN
  Administered 2021-03-21 (×2): 4000 [IU] via INTRAVENOUS

## 2021-03-21 MED ORDER — SODIUM CHLORIDE 0.9 % IV SOLN: 250.0000 mL | INTRAVENOUS | Status: DC | PRN

## 2021-03-21 MED ORDER — VERAPAMIL HCL 2.5 MG/ML IV SOLN
INTRAVENOUS | Status: AC
  Filled 2021-03-21: qty 2

## 2021-03-21 MED ORDER — IPRATROPIUM-ALBUTEROL 0.5-2.5 (3) MG/3ML IN SOLN
3.0000 mL | Freq: Four times a day (QID) | RESPIRATORY_TRACT | Status: DC
  Administered 2021-03-21 – 2021-03-22 (×3): 3 mL via RESPIRATORY_TRACT
  Filled 2021-03-21 (×3): qty 3

## 2021-03-21 MED ORDER — B COMPLEX PO TABS: 1.0000 | ORAL_TABLET | Freq: Every day | ORAL | Status: DC

## 2021-03-21 MED ORDER — IOHEXOL 300 MG/ML  SOLN
INTRAMUSCULAR | Status: DC | PRN
  Administered 2021-03-21: 260 mL

## 2021-03-21 MED ORDER — ACETAMINOPHEN 325 MG PO TABS: 650.0000 mg | ORAL_TABLET | ORAL | Status: DC | PRN

## 2021-03-21 MED ORDER — NITROGLYCERIN 0.4 MG SL SUBL: 0.4000 mg | SUBLINGUAL_TABLET | SUBLINGUAL | Status: DC | PRN

## 2021-03-21 MED ORDER — CALAMINE EX LOTN: 1.0000 "application " | TOPICAL_LOTION | Freq: Two times a day (BID) | CUTANEOUS | Status: DC

## 2021-03-21 MED ORDER — SODIUM CHLORIDE 0.9% FLUSH: 3.0000 mL | INTRAVENOUS | Status: DC | PRN

## 2021-03-21 MED ORDER — FLUTICASONE PROPIONATE 50 MCG/ACT NA SUSP
1.0000 | Freq: Every day | NASAL | Status: DC
  Administered 2021-03-22: 1 via NASAL
  Filled 2021-03-21: qty 16

## 2021-03-21 MED ORDER — NITROGLYCERIN 1 MG/10 ML FOR IR/CATH LAB
INTRA_ARTERIAL | Status: DC | PRN
  Administered 2021-03-21: 200 ug via INTRACORONARY

## 2021-03-21 MED ORDER — PRAVASTATIN SODIUM 40 MG PO TABS
80.0000 mg | ORAL_TABLET | Freq: Every day | ORAL | Status: DC
  Administered 2021-03-21: 80 mg via ORAL
  Filled 2021-03-21: qty 2

## 2021-03-21 MED ORDER — OXYCODONE HCL 5 MG PO TABS
10.0000 mg | ORAL_TABLET | Freq: Three times a day (TID) | ORAL | Status: DC | PRN
  Administered 2021-03-21 – 2021-03-22 (×2): 10 mg via ORAL
  Filled 2021-03-21 (×2): qty 2

## 2021-03-21 MED ORDER — LISINOPRIL 5 MG PO TABS
5.0000 mg | ORAL_TABLET | Freq: Every day | ORAL | Status: DC
  Administered 2021-03-22: 5 mg via ORAL
  Filled 2021-03-21: qty 1

## 2021-03-21 MED ORDER — VERAPAMIL HCL 2.5 MG/ML IV SOLN
INTRAVENOUS | Status: DC | PRN
  Administered 2021-03-21: 2.5 mg via INTRA_ARTERIAL

## 2021-03-21 MED ORDER — ASPIRIN 81 MG PO CHEW: 81.0000 mg | CHEWABLE_TABLET | ORAL | Status: DC

## 2021-03-21 MED ORDER — MOMETASONE FURO-FORMOTEROL FUM 100-5 MCG/ACT IN AERO
2.0000 | INHALATION_SPRAY | Freq: Two times a day (BID) | RESPIRATORY_TRACT | Status: DC
  Administered 2021-03-21 – 2021-03-22 (×2): 2 via RESPIRATORY_TRACT
  Filled 2021-03-21: qty 8.8

## 2021-03-21 MED ORDER — B COMPLEX-C PO TABS
1.0000 | ORAL_TABLET | Freq: Every day | ORAL | Status: DC
  Administered 2021-03-21 – 2021-03-22 (×2): 1 via ORAL
  Filled 2021-03-21 (×2): qty 1

## 2021-03-21 MED ORDER — OXYCODONE HCL 5 MG PO TABS
ORAL_TABLET | ORAL | Status: AC
  Administered 2021-03-21: 5 mg via ORAL
  Filled 2021-03-21: qty 1

## 2021-03-21 MED ORDER — FENTANYL CITRATE (PF) 100 MCG/2ML IJ SOLN
INTRAMUSCULAR | Status: DC | PRN
  Administered 2021-03-21: 50 ug via INTRAVENOUS
  Administered 2021-03-21: 25 ug via INTRAVENOUS

## 2021-03-21 MED ORDER — SODIUM CHLORIDE 0.9 % WEIGHT BASED INFUSION
3.0000 mL/kg/h | INTRAVENOUS | Status: AC
  Administered 2021-03-21: 3 mL/kg/h via INTRAVENOUS

## 2021-03-21 MED ORDER — LORATADINE 10 MG PO TABS
10.0000 mg | ORAL_TABLET | Freq: Every day | ORAL | Status: DC
  Administered 2021-03-22: 10 mg via ORAL
  Filled 2021-03-21: qty 1

## 2021-03-21 MED ORDER — ZINC SULFATE 220 (50 ZN) MG PO CAPS
220.0000 mg | ORAL_CAPSULE | Freq: Every day | ORAL | Status: DC
  Administered 2021-03-21 – 2021-03-22 (×2): 220 mg via ORAL
  Filled 2021-03-21 (×2): qty 1

## 2021-03-21 MED ORDER — TICAGRELOR 90 MG PO TABS
ORAL_TABLET | ORAL | Status: AC
  Filled 2021-03-21: qty 2

## 2021-03-21 MED ORDER — ATOMOXETINE HCL 40 MG PO CAPS
80.0000 mg | ORAL_CAPSULE | Freq: Every day | ORAL | Status: DC
  Administered 2021-03-22: 80 mg via ORAL
  Filled 2021-03-21: qty 2

## 2021-03-21 MED ORDER — CYCLOBENZAPRINE HCL 5 MG PO TABS
5.0000 mg | ORAL_TABLET | Freq: Every day | ORAL | Status: DC
  Administered 2021-03-21: 5 mg via ORAL
  Filled 2021-03-21 (×2): qty 1

## 2021-03-21 MED ORDER — SODIUM CHLORIDE 0.9% FLUSH
3.0000 mL | Freq: Two times a day (BID) | INTRAVENOUS | Status: DC
  Administered 2021-03-21 – 2021-03-22 (×2): 3 mL via INTRAVENOUS

## 2021-03-21 MED ORDER — TIROFIBAN HCL IN NACL 5-0.9 MG/100ML-% IV SOLN
0.1500 ug/kg/min | INTRAVENOUS | Status: AC
  Administered 2021-03-21 – 2021-03-22 (×2): 0.15 ug/kg/min via INTRAVENOUS
  Filled 2021-03-21 (×3): qty 100

## 2021-03-21 MED ORDER — FUROSEMIDE 40 MG PO TABS: 60.0000 mg | ORAL_TABLET | Freq: Every day | ORAL | Status: DC | PRN

## 2021-03-21 MED ORDER — ADULT MULTIVITAMIN W/MINERALS CH
1.0000 | ORAL_TABLET | Freq: Every day | ORAL | Status: DC
  Administered 2021-03-22: 1 via ORAL
  Filled 2021-03-21: qty 1

## 2021-03-21 MED ORDER — TRAZODONE HCL 100 MG PO TABS
300.0000 mg | ORAL_TABLET | Freq: Every day | ORAL | Status: DC
  Administered 2021-03-21: 300 mg via ORAL
  Filled 2021-03-21: qty 3

## 2021-03-21 MED ORDER — ONDANSETRON HCL 4 MG/2ML IJ SOLN: 4.0000 mg | Freq: Four times a day (QID) | INTRAMUSCULAR | Status: DC | PRN

## 2021-03-21 MED ORDER — ALBUTEROL SULFATE (2.5 MG/3ML) 0.083% IN NEBU: 2.5000 mg | INHALATION_SOLUTION | Freq: Four times a day (QID) | RESPIRATORY_TRACT | Status: DC | PRN

## 2021-03-21 MED ORDER — PREGABALIN 50 MG PO CAPS
200.0000 mg | ORAL_CAPSULE | Freq: Every day | ORAL | Status: DC
  Administered 2021-03-22: 200 mg via ORAL
  Filled 2021-03-21: qty 4

## 2021-03-21 MED ORDER — METOPROLOL SUCCINATE ER 50 MG PO TB24
50.0000 mg | ORAL_TABLET | Freq: Every day | ORAL | Status: DC
  Administered 2021-03-22: 50 mg via ORAL
  Filled 2021-03-21: qty 1

## 2021-03-21 MED ORDER — CALAMINE EX LOTN
1.0000 "application " | TOPICAL_LOTION | Freq: Two times a day (BID) | CUTANEOUS | Status: DC
  Filled 2021-03-21: qty 5

## 2021-03-21 MED ORDER — MIDAZOLAM HCL 2 MG/2ML IJ SOLN
INTRAMUSCULAR | Status: DC | PRN
  Administered 2021-03-21 (×2): 1 mg via INTRAVENOUS

## 2021-03-21 MED ORDER — TIROFIBAN HCL IV 12.5 MG/250 ML
INTRAVENOUS | Status: AC
  Filled 2021-03-21: qty 250

## 2021-03-21 MED ORDER — TICAGRELOR 90 MG PO TABS
90.0000 mg | ORAL_TABLET | Freq: Two times a day (BID) | ORAL | Status: DC
  Administered 2021-03-21 – 2021-03-22 (×2): 90 mg via ORAL
  Filled 2021-03-21 (×2): qty 1

## 2021-03-21 MED ORDER — HYDROMORPHONE HCL 1 MG/ML IJ SOLN
1.0000 mg | INTRAMUSCULAR | Status: DC | PRN
  Administered 2021-03-21 – 2021-03-22 (×2): 1 mg via INTRAVENOUS
  Filled 2021-03-21 (×2): qty 1

## 2021-03-21 MED ORDER — TIROFIBAN HCL IN NACL 5-0.9 MG/100ML-% IV SOLN
INTRAVENOUS | Status: AC | PRN
  Administered 2021-03-21: 0.15 ug/kg/min via INTRAVENOUS

## 2021-03-21 MED ORDER — ONDANSETRON HCL 4 MG PO TABS: 4.0000 mg | ORAL_TABLET | Freq: Four times a day (QID) | ORAL | Status: DC | PRN

## 2021-03-21 MED ORDER — ALBUTEROL SULFATE HFA 108 (90 BASE) MCG/ACT IN AERS: 1.0000 | INHALATION_SPRAY | Freq: Four times a day (QID) | RESPIRATORY_TRACT | Status: DC | PRN

## 2021-03-21 SURGICAL SUPPLY — 17 items
BALLN TREK RX 2.5X15 (BALLOONS) ×2
BALLOON TREK RX 2.5X15 (BALLOONS) ×1 IMPLANT
CATH INFINITI 5 FR JL3.5 (CATHETERS) ×2 IMPLANT
CATH INFINITI JR4 5F (CATHETERS) ×2 IMPLANT
CATH VISTA GUIDE 6FR XBLAD3.5 (CATHETERS) ×2 IMPLANT
DEVICE RAD TR BAND REGULAR (VASCULAR PRODUCTS) ×2 IMPLANT
DRAPE BRACHIAL (DRAPES) ×2 IMPLANT
GLIDESHEATH SLEND SS 6F .021 (SHEATH) ×2 IMPLANT
KIT ENCORE 26 ADVANTAGE (KITS) ×2 IMPLANT
PACK CARDIAC CATH (CUSTOM PROCEDURE TRAY) ×2 IMPLANT
PROTECTION STATION PRESSURIZED (MISCELLANEOUS) ×2
SET ATX SIMPLICITY (MISCELLANEOUS) ×2 IMPLANT
STATION PROTECTION PRESSURIZED (MISCELLANEOUS) ×1 IMPLANT
STENT RESOLUTE ONYX 2.5X22 (Permanent Stent) ×2 IMPLANT
TUBING CIL FLEX 10 FLL-RA (TUBING) ×2 IMPLANT
WIRE G HI TQ BMW 190 (WIRE) ×2 IMPLANT
WIRE HI TORQ VERSACORE J 260CM (WIRE) ×2 IMPLANT

## 2021-03-21 NOTE — Progress Notes (Addendum)
   03/21/21 1100  Clinical Encounter Type  Visited With Patient and family together  Visit Type Initial;Spiritual support;Social support  Referral From Nurse  Consult/Referral To Chaplain   Chaplain Curley Spice and Oleta Mouse completed one AD for Brett Fernandez.

## 2021-03-21 NOTE — Consult Note (Signed)
ANTICOAGULATION CONSULT NOTE - Initial Consult  Pharmacy Consult for Aggrastat  Indication: ACS -- s/p cath   Allergies  Allergen Reactions   Aspirin Other (See Comments)    Other Reaction: steven-johnson reaction [name]   Bee Venom Anaphylaxis   Sulfa Antibiotics Swelling    Other reaction(s): Unknown    Patient Measurements: Height: 5\' 7"  (170.2 cm) Weight: 96.6 kg (213 lb) IBW/kg (Calculated) : 66.1   Vital Signs: Temp: 98.4 F (36.9 C) (07/07 1135) Temp Source: Oral (07/07 1135) BP: 129/93 (07/07 1345) Pulse Rate: 80 (07/07 1345)  Labs: No results for input(s): HGB, HCT, PLT, APTT, LABPROT, INR, HEPARINUNFRC, HEPRLOWMOCWT, CREATININE, CKTOTAL, CKMB, TROPONINIHS in the last 72 hours.  CrCl cannot be calculated (Patient's most recent lab result is older than the maximum 21 days allowed.).   Medical History: Past Medical History:  Diagnosis Date   Acute respiratory failure with hypoxia (HCC) 03/21/2020   after multiple bee stings   Allergy    Anginal pain (HCC)    Bipolar disorder (HCC)    BPH (benign prostatic hypertrophy)    CHF (congestive heart failure) (HCC)    COPD (chronic obstructive pulmonary disease) (HCC)    COPD exacerbation (HCC) 11/02/2015   Coronary artery disease    Deep vein thrombosis (DVT) (HCC)    Depression    DJD (degenerative joint disease)    Gastric ulcer    GERD (gastroesophageal reflux disease)    History of hiatal hernia    Hypertension    Hypothyroidism    Kidney stones    Lethargy 11/02/2015   Neuromuscular disorder (HCC)    PTSD (post-traumatic stress disorder)     Medications:  Heparin 8,000 units total given 7/7 1220 during PCI Ticagrelor 180 mg x 1 7/7 1220  Assessment: 60 y.o male admitted 03/21/21 for left heart catherization. Pharmacy has been consulted to continue aggrastat infusion for 18 hours following PCI.  Goal of Therapy:  Monitor platelets by anticoagulation protocol: Yes   Plan:   Continue aggrastat 0.15 mcg/kg/min x 18 hours CBC in 6 hours following initiation and with AM labs   05/22/21, PharmD, BCPS Clinical Pharmacist   03/21/2021,2:20 PM

## 2021-03-22 ENCOUNTER — Encounter: Payer: Self-pay | Admitting: Internal Medicine

## 2021-03-22 DIAGNOSIS — Z955 Presence of coronary angioplasty implant and graft: Secondary | ICD-10-CM

## 2021-03-22 DIAGNOSIS — I25110 Atherosclerotic heart disease of native coronary artery with unstable angina pectoris: Secondary | ICD-10-CM | POA: Diagnosis not present

## 2021-03-22 LAB — CBC
HCT: 42.2 % (ref 39.0–52.0)
Hemoglobin: 13.7 g/dL (ref 13.0–17.0)
MCH: 32.1 pg (ref 26.0–34.0)
MCHC: 32.5 g/dL (ref 30.0–36.0)
MCV: 98.8 fL (ref 80.0–100.0)
Platelets: 176 10*3/uL (ref 150–400)
RBC: 4.27 MIL/uL (ref 4.22–5.81)
RDW: 13.7 % (ref 11.5–15.5)
WBC: 9.3 10*3/uL (ref 4.0–10.5)
nRBC: 0 % (ref 0.0–0.2)

## 2021-03-22 LAB — BASIC METABOLIC PANEL
Anion gap: 6 (ref 5–15)
BUN: 8 mg/dL (ref 6–20)
CO2: 29 mmol/L (ref 22–32)
Calcium: 8.7 mg/dL — ABNORMAL LOW (ref 8.9–10.3)
Chloride: 105 mmol/L (ref 98–111)
Creatinine, Ser: 0.82 mg/dL (ref 0.61–1.24)
GFR, Estimated: >60 mL/min (ref >60)
Glucose, Bld: 104 mg/dL — ABNORMAL HIGH (ref 70–99)
Potassium: 3.6 mmol/L (ref 3.5–5.1)
Sodium: 140 mmol/L (ref 135–145)

## 2021-03-22 LAB — CARDIAC CATHETERIZATION: Cath EF Quantitative: 60 %

## 2021-03-22 MED ORDER — TICAGRELOR 90 MG PO TABS: 90.0000 mg | ORAL_TABLET | Freq: Two times a day (BID) | ORAL | 6 refills | Status: AC

## 2021-03-22 MED ORDER — SODIUM CHLORIDE 0.9% FLUSH: 3.0000 mL | INTRAVENOUS | Status: DC | PRN

## 2021-03-22 MED ORDER — SODIUM CHLORIDE 0.9% FLUSH
3.0000 mL | Freq: Two times a day (BID) | INTRAVENOUS | Status: DC
  Administered 2021-03-22: 3 mL via INTRAVENOUS

## 2021-03-22 MED ORDER — PREGABALIN 150 MG PO CAPS: 300.0000 mg | ORAL_CAPSULE | Freq: Two times a day (BID) | ORAL | Status: DC

## 2021-03-22 MED ORDER — SODIUM CHLORIDE 0.9 % WEIGHT BASED INFUSION: 3.0000 mL/kg/h | INTRAVENOUS | Status: AC

## 2021-03-22 MED ORDER — ASPIRIN 81 MG PO CHEW: 81.0000 mg | CHEWABLE_TABLET | ORAL | Status: DC

## 2021-03-22 MED ORDER — SODIUM CHLORIDE 0.9 % WEIGHT BASED INFUSION: 1.0000 mL/kg/h | INTRAVENOUS | Status: DC

## 2021-03-22 MED ORDER — PREGABALIN 200 MG PO CAPS: 200.0000 mg | ORAL_CAPSULE | Freq: Every day | ORAL | 6 refills | Status: DC

## 2021-03-22 MED ORDER — SODIUM CHLORIDE 0.9 % IV SOLN: 250.0000 mL | INTRAVENOUS | Status: DC | PRN

## 2021-03-22 NOTE — Progress Notes (Signed)
PATIENT ALERT AND ORIENTED X 4. COMPLAINS OF GENERALIZED CHRONIC PAIN. NO CHANGE IN PREVIOUS ASSESSMENT. AMBULATED PATIENT IN HALLWAY ON ROOM AIR AND 02 SATS REMAINED 94%. HE WILL BE DISCHARGED HOME AND WENT OVER DISCHARGE INSTRUCTIONS AND MEDS WITH PATIENT. HE DOES NOT HAVE ANY QUESTIONS AT THIS TIME. IV WAS REMOVED AND DRESSING PLACED. DRESSING REMAINS IN PLACE TO RIGHT WRIST WHERE TR BAND WAS REMOVED AND THERE IS NO SIGNS OF BLEEDING OR HEMATOMA. PATIENT WAS GIVEN STENT CARD AND ALSO BRILLINTA CARD TO TAKE TO PHARMACY TO PICK UP MED.

## 2021-03-22 NOTE — Progress Notes (Signed)
Rounded on patient today and he appears to be doing well. The patient states that he did experience some mild, non-radiating chest tightness and fatigue after ambulating around the unit for a few minutes; however, he states that the symptoms resolved fairly quickly with rest. He states that he is ready to be discharged home with his family. The patient's ECG on today reveals NSR with occasional PVC's and is without any ST changes, bedside telemetry monitor reveals NSR and HR of 87 bpm. The patient is Aox4, vss yet he is slightly hypertensive, HRRR, s1/s2 upon ausculation, +2 peripheral pulses and lung sounds are clear bilaterally. The patient's blood pressure will be checked prior to discharge and the patient was educated on the importance of monitoring his bp closely at home.The patient's right radial site is clean, dry, intact, without edema, bruising, erythema or hematoma. The patient was educated on the importance of being compliant with all of his medications, including the brilinta. Thorough medication education was provided to the patient by this NP. The patient was also educated on the importance of following precautions and lifting restrictions with his right hand/arm. He verbalized understanding of all information. We will follow up with the patient in the [facility] office in 1 week.

## 2021-03-22 NOTE — Progress Notes (Signed)
   03/22/21 0700  Clinical Encounter Type  Visited With Patient  Visit Type Follow-up;Spiritual support;Social support  Referral From Chaplain  Consult/Referral To Chaplain  Spiritual Encounters  Spiritual Needs Emotional  Stress Factors  Patient Stress Factors Health changes  Advance Directives (For Healthcare)  Does Patient Have a Medical Advance Directive? Yes    03/22/21 0700  Clinical Encounter Type  Visited With Patient  Visit Type Follow-up;Spiritual support;Social support  Referral From Chaplain  Consult/Referral To Chaplain  Spiritual Encounters  Spiritual Needs Emotional  Stress Factors  Patient Stress Factors Health changes  Advance Directives (For Healthcare)  Does Patient Have a Medical Advance Directive? Yes  Chaplain Keandre Linden did a follow up visit with Mr. Brett Fernandez in room 241A. I informed [name] we were just doing a routine FU to see how his surgery went and to let him know we had completed his Advance Directive and it is placed in his chart. [name] thanked me for coming and he stated, "his surgery went well but he's a little sore, but he appreciated all our help and the visit. I provided a ministry of presence, emotional and spiritual support.

## 2021-03-22 NOTE — Discharge Summary (Signed)
Physician Discharge Summary      Patient ID: Brett Fernandez MRN: [id number] DOB/AGE: 19-Dec-1960 60 y.o.  Admit date: 03/21/2021 Discharge date: 03/22/2021  Primary Discharge Diagnosis unstable angina status post PCI and stent DES Secondary Discharge Diagnosis coronary artery disease chronic pain  Significant Diagnostic Studies: angiography: Cardiac cath PCI and stent DES to LAD radial approach  Consults:  Pharmacy consult for Aggrastat  Hospital Course: Patient was brought in as an outpatient with diagnostic cardiac cath from right radial approach he was found to have normal left ventricular function left dominant system high-grade lesion in the mid LAD 100% with TIMI I flow.  Patient with successful PCI and stent with DES to the mid LAD residual distal 75% lesion was deferred.  Patient had chest pain symptoms yesterday after the procedure EKG is unremarkable troponins are unremarkable pain appears to be noncardiac he has history of chronic pain he was treated with nitrates stayed on Brilinta but given extra pain medications like Dilaudid and advised to follow-up with his primary physician   Discharge Exam: Blood pressure (!) 144/87, pulse 83, temperature 98 F (36.7 C), temperature source Oral, resp. rate 18, height 5\' 7"  (1.702 m), weight 96.6 kg, SpO2 94 %.    General appearance: appears older than stated age HEENT within normal limits Neck exam supple significant JVD present abdomen Lung exam clear to auscultation percussion Heart exam regular rate and rhythm soft systolic ejection murmur PMI nondisplaced Abdominal exam was benign Extremities and within normal limits 2+ pulses throughout right radial site appears to be normal no complications Neurological grossly intact with chronic pain Labs:   Lab Results  Component Value Date   WBC 9.3 03/22/2021   HGB 13.7 03/22/2021   HCT 42.2 03/22/2021   MCV 98.8 03/22/2021   PLT 176 03/22/2021    Recent Labs  Lab  03/22/21 0454  NA 140  K 3.6  CL 105  CO2 29  BUN 8  CREATININE 0.82  CALCIUM 8.7*  GLUCOSE 104*      Radiology: Cardiac cath with high-grade lesion in LAD normal left ventricular function EKG: Normal sinus rhythm nonspecific ST-T changes.  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   albuterol (2.5 MG/3ML) 0.083% nebulizer solution Commonly known as: PROVENTIL Take 3 mLs (2.5 mg total) by nebulization every 6 (six) hours as needed for wheezing or shortness of breath.   atomoxetine 80 MG capsule Commonly known as: STRATTERA Take 80 mg by mouth daily.   b complex vitamins  tablet Take 1 tablet by mouth daily.   calamine lotion Apply 1 application topically as needed for itching.   CENTRUM SILVER 50+MEN PO Take 1 tablet by mouth daily.   cyclobenzaprine 5 MG tablet Commonly known as: FLEXERIL Take 5 mg by mouth at bedtime.   Dulera 100-5 MCG/ACT Aero Generic drug: mometasone-formoterol Inhale 2 puffs into the lungs 2 (two) times daily.   EPINEPHrine 0.3 mg/0.3 mL Soaj injection Commonly known as:  EPI-PEN Inject 0.3 mLs (0.3 mg total) into the muscle as needed for anaphylaxis.   fluticasone 50 MCG/ACT nasal spray Commonly known as: FLONASE Place 1 spray into both nostrils in the morning and at bedtime.   furosemide 20 MG tablet Commonly known as: LASIX Take 60 mg by mouth daily as needed for edema.   hydrocortisone cream 1 % Apply 1 application topically 2 (two) times daily as needed for itching.   isosorbide mononitrate 30 MG 24 hr tablet Commonly known as: IMDUR Take 1 tablet (30 mg total) by mouth daily.   levothyroxine 50 MCG tablet Commonly known as: SYNTHROID Take 50 mcg by mouth every evening.   lisinopril 5 MG tablet Commonly known as: ZESTRIL Take 5 mg by mouth daily.   loratadine 10 MG tablet Commonly known as: CLARITIN Take 10 mg by mouth daily.   metoprolol succinate 50 MG 24 hr tablet Commonly known as: TOPROL-XL Take 50 mg by mouth daily. Take with or immediately following a meal.   Nebulizer System All-In-One Misc Use as directed for COPD   nicotine 21 mg/24hr patch Commonly known as: NICODERM CQ - dosed in mg/24 hours Place 1 patch (21 mg total) onto the skin daily.   nitroGLYCERIN 0.4 MG SL tablet Commonly known as: NITROSTAT Place 1 tablet (0.4 mg total) under the tongue every 5 (five) minutes as needed for chest pain.   omeprazole 40 MG capsule Commonly known as: PRILOSEC Take 40 mg by mouth daily.   ondansetron 4 MG tablet Commonly known as: ZOFRAN Take 1 tablet (4 mg total) by mouth every 6 (six) hours as needed for nausea.   Oxycodone HCl 10 MG Tabs Take 10 mg by mouth 3 (three) times daily as needed (pain).   oxyCODONE 5 MG immediate release tablet Commonly known as: Oxy IR/ROXICODONE Take 1 tablet (5 mg total) by mouth every 6 (six) hours as needed for severe pain.   polyethylene glycol 17 g packet Commonly known as: MIRALAX / GLYCOLAX Take 17 g by mouth daily as needed.   polyethylene glycol 17 g packet Commonly known  as: MiraLax Take 17 g by mouth daily.   potassium citrate 10 MEQ (1080 MG) SR tablet Commonly known as: UROCIT-K Take 20 mEq by mouth 2 (two) times daily.   pravastatin 80 MG tablet Commonly known as: PRAVACHOL Take 80 mg by mouth at bedtime.   pregabalin 200 MG capsule Commonly known as: LYRICA Take 200 mg by mouth in the morning, at noon, and at bedtime. What changed: Another medication with the same name was added. Make sure you understand how and when to take each.   pregabalin 200 MG capsule Commonly known as: LYRICA Take 1 capsule (200 mg total) by mouth daily. What changed: You were already taking a medication with the same name, and this prescription was added. Make sure you understand how and when to take each.   tamsulosin 0.4 MG Caps capsule Commonly known as: FLOMAX Take 0.8 mg by mouth daily.   ticagrelor 90 MG Tabs tablet  Commonly known as: BRILINTA Take 1 tablet (90 mg total) by mouth 2 (two) times daily.   traZODone 100 MG tablet Commonly known as: DESYREL Take 300 mg by mouth at bedtime.   Turmeric 500 MG Caps Take 500 mg by mouth in the morning and at bedtime.   warfarin 5 MG tablet Commonly known as: COUMADIN Take 5 mg by mouth every evening.   zinc gluconate 50 MG tablet Take 50 mg by mouth daily.       ASK your doctor about these medications    Ipratropium-Albuterol 20-100 MCG/ACT Aers respimat Commonly known as: COMBIVENT Inhale 2 puffs into the lungs every 6 (six) hours.        Follow-up Information     Dorothyann Peng D, MD. Schedule an appointment as soon as possible for a visit in 2 week(s).   Specialties: Cardiology, Internal Medicine Contact information: [address] [place] [address] [phone]                 BRING ALL MEDICATIONS WITH YOU TO FOLLOW UP APPOINTMENTS  Time spent with patient to include physician time:  Signed:  Alwyn Pea MD 03/22/2021, 8:16 AM

## 2021-03-22 NOTE — Care Management (Signed)
Per bedside RN O2 is not indicated at discharge

## 2021-03-22 NOTE — Discharge Instructions (Addendum)
Resume Coumadin 5 mg a day in addition to Brilinta 90 mg twice daily Follow-up in the office 1 to 2 weeks

## 2021-03-22 NOTE — Clinical Social Work Note (Signed)
Per AVS, patient will discharge on new Brilinta. Provided patient with 30-day free card. Patient said plan is for discharge today but is waiting to see MD and that his brother will take him home.  Charlynn Court, CSW [phone]

## 2022-01-13 ENCOUNTER — Telehealth: Payer: Self-pay | Admitting: *Deleted

## 2022-01-13 NOTE — Telephone Encounter (Signed)
Pt called stating that his PCP - [facility] - made a referral to lung cancer screening for him. Pt given office number for [facility] to contact to follow up on appt for lung cancer screening. Pt verbalized understanding.  ?

## 2022-01-14 ENCOUNTER — Other Ambulatory Visit: Payer: Self-pay | Admitting: *Deleted

## 2022-01-14 DIAGNOSIS — Z122 Encounter for screening for malignant neoplasm of respiratory organs: Secondary | ICD-10-CM

## 2022-01-14 DIAGNOSIS — Z87891 Personal history of nicotine dependence: Secondary | ICD-10-CM

## 2022-01-14 DIAGNOSIS — F17210 Nicotine dependence, cigarettes, uncomplicated: Secondary | ICD-10-CM

## 2022-01-29 ENCOUNTER — Ambulatory Visit
Admission: RE | Admit: 2022-01-29 | Discharge: 2022-01-29 | Disposition: A | Discharge status: Home / Self Care | Payer: Medicaid Other | Source: Ambulatory Visit | Attending: Acute Care | Admitting: Acute Care

## 2022-01-29 ENCOUNTER — Ambulatory Visit (INDEPENDENT_AMBULATORY_CARE_PROVIDER_SITE_OTHER): Payer: Medicaid Other | Admitting: Acute Care

## 2022-01-29 ENCOUNTER — Encounter: Payer: Self-pay | Admitting: Acute Care

## 2022-01-29 DIAGNOSIS — F17210 Nicotine dependence, cigarettes, uncomplicated: Secondary | ICD-10-CM | POA: Insufficient documentation

## 2022-01-29 DIAGNOSIS — N200 Calculus of kidney: Secondary | ICD-10-CM | POA: Diagnosis not present

## 2022-01-29 DIAGNOSIS — J439 Emphysema, unspecified: Secondary | ICD-10-CM | POA: Diagnosis not present

## 2022-01-29 DIAGNOSIS — I2510 Atherosclerotic heart disease of native coronary artery without angina pectoris: Secondary | ICD-10-CM | POA: Insufficient documentation

## 2022-01-29 DIAGNOSIS — Z122 Encounter for screening for malignant neoplasm of respiratory organs: Secondary | ICD-10-CM | POA: Diagnosis present

## 2022-01-29 DIAGNOSIS — I700 Atherosclerosis of aorta: Secondary | ICD-10-CM | POA: Diagnosis not present

## 2022-01-29 DIAGNOSIS — R918 Other nonspecific abnormal finding of lung field: Secondary | ICD-10-CM | POA: Insufficient documentation

## 2022-01-29 DIAGNOSIS — Z87891 Personal history of nicotine dependence: Secondary | ICD-10-CM | POA: Diagnosis present

## 2022-01-29 NOTE — Progress Notes (Signed)
Virtual Visit via Telephone Note ? ?I connected with Brett Fernandez on 01/29/22 at 12:00 PM EDT by telephone and verified that I am speaking with the correct person using two identifiers. ? ?Location: ?Patient:  At home ?Provider:  [address] [place], [place], [address]  ?  ?I discussed the limitations, risks, security and privacy concerns of performing an evaluation and management service by telephone and the availability of in person appointments. I also discussed with the patient that there may be a patient responsible charge related to this service. The patient expressed understanding and agreed to proceed. ? ? ?Shared Decision Making Visit Lung Cancer Screening Program ?([phone]) ? ? ?Eligibility: ?Age 3 y.o. ?Pack Years Smoking History Calculation 100+  pack year smoking history ?(# packs/per year x # years smoked) ?Recent History of coughing up blood  no ?Unexplained weight loss? no ?( >Than 15 pounds within the last 6 months ) ?Prior History Lung / other cancer no ?(Diagnosis within the last 5 years already requiring surveillance chest CT Scans). ?Smoking Status Current Smoker ?Former Smokers: Years since quit:  NA ? Quit Date:  NA ? ?Visit Components: ?Discussion included one or more decision making aids. yes ?Discussion included risk/benefits of screening. yes ?Discussion included potential follow up diagnostic testing for abnormal scans. yes ?Discussion included meaning and risk of over diagnosis. yes ?Discussion included meaning and risk of False Positives. yes ?Discussion included meaning of total radiation exposure. yes ? ?Counseling Included: ?Importance of adherence to annual lung cancer LDCT screening. yes ?Impact of comorbidities on ability to participate in the program. yes ?Ability and willingness to under diagnostic treatment. yes ? ?Smoking Cessation Counseling: ?Current Smokers:  ?Discussed importance of smoking cessation. yes ?Information about tobacco cessation classes and  interventions provided to patient. yes ?Patient provided with "ticket" for LDCT Scan. yes ?Symptomatic Patient. no ? Counseling NA ?Diagnosis Code: Tobacco Use Z72.0 ?Asymptomatic Patient yes ? Counseling (Intermediate counseling: > three minutes counseling) U0454 ?Former Smokers:  ?Discussed the importance of maintaining cigarette abstinence. yes ?Diagnosis Code: Personal History of Nicotine Dependence. U98.119 ?Information about tobacco cessation classes and interventions provided to patient. Yes ?Patient provided with "ticket" for LDCT Scan. yes ?Written Order for Lung Cancer Screening with LDCT placed in Epic. Yes ?(CT Chest Lung Cancer Screening Low Dose W/O CM) [id number] ?Z12.2-Screening of respiratory organs ?Z87.891-Personal history of nicotine dependence ? ?I have spent 25 minutes of face to face/ virtual visit   time with  Brett Fernandez discussing the risks and benefits of lung cancer screening. We viewed / discussed a power point together that explained in detail the above noted topics. We paused at intervals to allow for questions to be asked and answered to ensure understanding.We discussed that the single most powerful action that he can take to decrease his risk of developing lung cancer is to quit smoking. We discussed whether or not he is ready to commit to setting a quit date. We discussed options for tools to aid in quitting smoking including nicotine replacement therapy, non-nicotine medications, support groups, Quit Smart classes, and behavior modification. We discussed that often times setting smaller, more achievable goals, such as eliminating 1 cigarette a day for a week and then 2 cigarettes a day for a week can be helpful in slowly decreasing the number of cigarettes smoked. This allows for a sense of accomplishment as well as providing a clinical benefit. I provided  him  with smoking cessation  information  with contact information for community resources,  classes, free nicotine replacement  therapy, and access to mobile apps, text messaging, and on-line smoking cessation help. I have also provided  him  the office contact information in the event he needs to contact me, or the screening staff. We discussed the time and location of the scan, and that either Abigail Miyamotoenise Phelps RN, Karlton Lemonenise Buckner, RN  or I will call / send a letter with the results within 24-72 hours of receiving them. The patient verbalized understanding of all of  the above and had no further questions upon leaving the office. They have my contact information in the event they have any further questions. ? ?I spent 3-4 minutes counseling on smoking cessation and the health risks of continued tobacco abuse. ? ?I explained to the patient that there has been a high incidence of coronary artery disease noted on these exams. I explained that this is a non-gated exam therefore degree or severity cannot be determined. This patient is on statin therapy. I have asked the patient to follow-up with their PCP regarding any incidental finding of coronary artery disease and management with diet or medication as their PCP  feels is clinically indicated. The patient verbalized understanding of the above and had no further questions upon completion of the visit. ? ?  ? ? ?Bevelyn NgoSarah F Salem Lembke, NP ?01/29/2022 ? ? ? ? ? ? ?

## 2022-01-29 NOTE — Patient Instructions (Signed)
Thank you for participating in the [facility]. It was our pleasure to meet you today. We will call you with the results of your scan within the next few days. Your scan will be assigned a Lung RADS category score by the physicians reading the scans.  This Lung RADS score determines follow up scanning.  See below for description of categories, and follow up screening recommendations. We will be in touch to schedule your follow up screening annually or based on recommendations of our providers. We will fax a copy of your scan results to your Primary Care Physician, or the physician who referred you to the program, to ensure they have the results. Please call the office if you have any questions or concerns regarding your scanning experience or results.  Our office number is [phone]. Please speak with Denise Phelps, RN. , or  Denise Buckner RN, They are  our Lung Cancer Screening RN.'s If They are unavailable when you call, Please leave a message on the voice mail. We will return your call at our earliest convenience.This voice mail is monitored several times a day.  Remember, if your scan is normal, we will scan you annually as long as you continue to meet the criteria for the program. (Age 55-77, Current smoker or smoker who has quit within the last 15 years). If you are a smoker, remember, quitting is the single most powerful action that you can take to decrease your risk of lung cancer and other pulmonary, breathing related problems. We know quitting is hard, and we are here to help.  Please let us know if there is anything we can do to help you meet your goal of quitting. If you are a former smoker, congratulations. We are proud of you! Remain smoke free! Remember you can refer friends or family members through the number above.  We will screen them to make sure they meet criteria for the program. Thank you for helping us take better care of you by  participating in Lung Screening.  You can receive free nicotine replacement therapy ( patches, gum or mints) by calling 1-800-QUIT NOW. Please call so we can get you on the path to becoming  a non-smoker. I know it is hard, but you can do this!  Lung RADS Categories:  Lung RADS 1: no nodules or definitely non-concerning nodules.  Recommendation is for a repeat annual scan in 12 months.  Lung RADS 2:  nodules that are non-concerning in appearance and behavior with a very low likelihood of becoming an active cancer. Recommendation is for a repeat annual scan in 12 months.  Lung RADS 3: nodules that are probably non-concerning , includes nodules with a low likelihood of becoming an active cancer.  Recommendation is for a 6-month repeat screening scan. Often noted after an upper respiratory illness. We will be in touch to make sure you have no questions, and to schedule your 6-month scan.  Lung RADS 4 A: nodules with concerning findings, recommendation is most often for a follow up scan in 3 months or additional testing based on our provider's assessment of the scan. We will be in touch to make sure you have no questions and to schedule the recommended 3 month follow up scan.  Lung RADS 4 B:  indicates findings that are concerning. We will be in touch with you to schedule additional diagnostic testing based on our provider's  assessment of the scan.  Other options for assistance in smoking cessation (   As covered by your insurance benefits)  Hypnosis for smoking cessation  [employer] [phone]  Acupuncture for smoking cessation  [facility] [phone]   

## 2022-01-30 ENCOUNTER — Encounter: Payer: Self-pay | Admitting: Emergency Medicine

## 2022-01-30 ENCOUNTER — Emergency Department: Payer: Medicaid Other

## 2022-01-30 ENCOUNTER — Emergency Department
Admission: EM | Admit: 2022-01-30 | Discharge: 2022-01-30 | Disposition: A | Discharge status: Home / Self Care | Payer: Medicaid Other | Attending: Student in an Organized Health Care Education/Training Program | Admitting: Student in an Organized Health Care Education/Training Program

## 2022-01-30 ENCOUNTER — Other Ambulatory Visit: Payer: Self-pay

## 2022-01-30 DIAGNOSIS — Z20822 Contact with and (suspected) exposure to covid-19: Secondary | ICD-10-CM | POA: Insufficient documentation

## 2022-01-30 DIAGNOSIS — J441 Chronic obstructive pulmonary disease with (acute) exacerbation: Secondary | ICD-10-CM | POA: Diagnosis not present

## 2022-01-30 DIAGNOSIS — R0602 Shortness of breath: Secondary | ICD-10-CM | POA: Diagnosis present

## 2022-01-30 DIAGNOSIS — R109 Unspecified abdominal pain: Secondary | ICD-10-CM | POA: Insufficient documentation

## 2022-01-30 DIAGNOSIS — R079 Chest pain, unspecified: Secondary | ICD-10-CM

## 2022-01-30 LAB — BLOOD GAS, VENOUS
Acid-Base Excess: 5.6 mmol/L — ABNORMAL HIGH (ref 0.0–2.0)
Bicarbonate: 33.1 mmol/L — ABNORMAL HIGH (ref 20.0–28.0)
O2 Saturation: 38.9 %
Patient temperature: 37
pCO2, Ven: 60 mmHg (ref 44–60)
pH, Ven: 7.35 (ref 7.25–7.43)
pO2, Ven: <31 mmHg — CL (ref 32–45)

## 2022-01-30 LAB — CBC
HCT: 43.3 % (ref 39.0–52.0)
Hemoglobin: 13.9 g/dL (ref 13.0–17.0)
MCH: 31.9 pg (ref 26.0–34.0)
MCHC: 32.1 g/dL (ref 30.0–36.0)
MCV: 99.3 fL (ref 80.0–100.0)
Platelets: 180 10*3/uL (ref 150–400)
RBC: 4.36 MIL/uL (ref 4.22–5.81)
RDW: 14.2 % (ref 11.5–15.5)
WBC: 10.5 10*3/uL (ref 4.0–10.5)
nRBC: 0 % (ref 0.0–0.2)

## 2022-01-30 LAB — BASIC METABOLIC PANEL
Anion gap: 6 (ref 5–15)
BUN: 11 mg/dL (ref 8–23)
CO2: 27 mmol/L (ref 22–32)
Calcium: 8.5 mg/dL — ABNORMAL LOW (ref 8.9–10.3)
Chloride: 105 mmol/L (ref 98–111)
Creatinine, Ser: 0.94 mg/dL (ref 0.61–1.24)
GFR, Estimated: >60 mL/min (ref >60)
Glucose, Bld: 100 mg/dL — ABNORMAL HIGH (ref 70–99)
Potassium: 4.3 mmol/L (ref 3.5–5.1)
Sodium: 138 mmol/L (ref 135–145)

## 2022-01-30 LAB — RESP PANEL BY RT-PCR (FLU A&B, COVID) ARPGX2
Influenza A by PCR: NEGATIVE
Influenza B by PCR: NEGATIVE
SARS Coronavirus 2 by RT PCR: NEGATIVE

## 2022-01-30 LAB — D-DIMER, QUANTITATIVE: D-Dimer, Quant: <0.27 ug/mL-FEU (ref 0.00–0.50)

## 2022-01-30 LAB — TROPONIN I (HIGH SENSITIVITY): Troponin I (High Sensitivity): 3 ng/L (ref <18)

## 2022-01-30 MED ORDER — SODIUM CHLORIDE 0.9 % IV BOLUS
500.0000 mL | Freq: Once | INTRAVENOUS | Status: AC
  Administered 2022-01-30: 500 mL via INTRAVENOUS

## 2022-01-30 MED ORDER — IPRATROPIUM-ALBUTEROL 0.5-2.5 (3) MG/3ML IN SOLN
3.0000 mL | Freq: Once | RESPIRATORY_TRACT | Status: AC
  Administered 2022-01-30: 3 mL via RESPIRATORY_TRACT
  Filled 2022-01-30: qty 3

## 2022-01-30 MED ORDER — PREDNISONE 20 MG PO TABS: 40.0000 mg | ORAL_TABLET | Freq: Every day | ORAL | 0 refills | Status: AC

## 2022-01-30 MED ORDER — METHYLPREDNISOLONE SODIUM SUCC 125 MG IJ SOLR
125.0000 mg | Freq: Once | INTRAMUSCULAR | Status: AC
  Administered 2022-01-30: 125 mg via INTRAVENOUS
  Filled 2022-01-30: qty 2

## 2022-01-30 MED ORDER — IOHEXOL 300 MG/ML  SOLN
100.0000 mL | Freq: Once | INTRAMUSCULAR | Status: AC | PRN
  Administered 2022-01-30: 100 mL via INTRAVENOUS

## 2022-01-30 NOTE — ED Notes (Signed)
Discharge instructions including smoking cession and prescription discussed with pt. Pt verbalized understanding with no questions at this time. Pt to go home with brother.

## 2022-01-30 NOTE — ED Provider Notes (Signed)
[facility] Provider Note    Event Date/Time   First MD Initiated Contact with Patient 01/30/22 1520     (approximate)   History   Chest Pain   HPI  Brett Fernandez is a 61 y.o. male presents to the ER for evaluation of chest discomfort as well as shortness of breath and abdominal pain aches all over.  Patient states he had stress test yesterday does not know what the results were.  Denies any pain ripping or tearing through to his back.  No nausea or vomiting.  Feels like he might of pulled a muscle.  States he has arthritis all over and is having severe pain related to that.  No measured fevers no congestion.     Physical Exam   Triage Vital Signs: ED Triage Vitals  Enc Vitals Group     BP 01/30/22 1529 107/71     Pulse Rate 01/30/22 1529 62     Resp 01/30/22 1529 17     Temp 01/30/22 1529 97.9 F (36.6 C)     Temp Source 01/30/22 1529 Oral     SpO2 01/30/22 1529 95 %     Weight 01/30/22 1530 193 lb 8 oz (87.8 kg)     Height 01/30/22 1530  (1.727 m)     Head Circumference --      Peak Flow --      Pain Score 01/30/22 1530 5     Pain Loc --      Pain Edu? --      Excl. in GC? --     Most recent vital signs: Vitals:   01/30/22 2139 01/30/22 2140  BP: 124/74   Pulse: 80   Resp: (!) 21 (!) 21  Temp:    SpO2: 92%      Constitutional: Alert  Eyes: Conjunctivae are normal.  Head: Atraumatic. Nose: No congestion/rhinnorhea. Mouth/Throat: Mucous membranes are moist.   Neck: Painless ROM.  Cardiovascular:   Good peripheral circulation. No m/g/r Respiratory: Normal respiratory effort.   Prolonged expiratory phase with faint and expiratory wheezing. Gastrointestinal: Soft and nontender with mild right lower quadrant abdominal pain no guarding or rebound. Musculoskeletal:  no deformity Neurologic:  MAE spontaneously. No gross focal neurologic deficits are appreciated.  Skin:  Skin is warm, dry and intact. No rash noted. Psychiatric:  Mood and affect are normal. Speech and behavior are normal.    ED Results / Procedures / Treatments   Labs (all labs ordered are listed, but only abnormal results are displayed) Labs Reviewed  BASIC METABOLIC PANEL - Abnormal; Notable for the following components:      Result Value   Glucose, Bld 100 (*)    Calcium 8.5 (*)    All other components within normal limits  BLOOD GAS, VENOUS - Abnormal; Notable for the following components:   pO2, Ven <31 (*)    Bicarbonate 33.1 (*)    Acid-Base Excess 5.6 (*)    All other components within normal limits  RESP PANEL BY RT-PCR (FLU A&B, COVID) ARPGX2  CBC  D-DIMER, QUANTITATIVE  TROPONIN I (HIGH SENSITIVITY)  TROPONIN I (HIGH SENSITIVITY)     EKG  ED ECG REPORT I, Willy Eddy, the attending physician, personally viewed and interpreted this ECG.   Date: 01/30/2022  EKG Time: 15:27  Rate: 60  Rhythm: sinus  Axis: normal  Intervals: normal  ST&T Change: no stemi, no depressions    RADIOLOGY Please see ED Course for my review  and interpretation.  I personally reviewed all radiographic images ordered to evaluate for the above acute complaints and reviewed radiology reports and findings.  These findings were personally discussed with the patient.  Please see medical record for radiology report.    PROCEDURES:  Critical Care performed: No  Procedures   MEDICATIONS ORDERED IN ED: Medications  ipratropium-albuterol (DUONEB) 0.5-2.5 (3) MG/3ML nebulizer solution 3 mL (3 mLs Nebulization Given 01/30/22 1604)  ipratropium-albuterol (DUONEB) 0.5-2.5 (3) MG/3ML nebulizer solution 3 mL (3 mLs Nebulization Given 01/30/22 1623)  methylPREDNISolone sodium succinate (SOLU-MEDROL) 125 mg/2 mL injection 125 mg (125 mg Intravenous Given 01/30/22 1622)  ipratropium-albuterol (DUONEB) 0.5-2.5 (3) MG/3ML nebulizer solution 3 mL (3 mLs Nebulization Given 01/30/22 2051)  sodium chloride 0.9 % bolus 500 mL (500 mLs Intravenous New  Bag/Given 01/30/22 2134)  iohexol (OMNIPAQUE) 300 MG/ML solution 100 mL (100 mLs Intravenous Contrast Given 01/30/22 2123)     IMPRESSION / MDM / ASSESSMENT AND PLAN / ED COURSE  I reviewed the triage vital signs and the nursing notes.                              Differential diagnosis includes, but is not limited to, COPD, pneumonia, CHF, ACS, PE, stone, appendicitis, colitis, musculoskeletal strain  Patient presented to the ER for evaluation of symptoms as described above.  This presenting complaint could reflect a potentially life-threatening illness therefore the patient will be placed on continuous pulse oximetry and telemetry for monitoring.  Laboratory evaluation will be sent to evaluate for the above complaints.      Clinical Course as of 01/30/22 2208  Thu Jan 30, 2022  1614 Chest x-ray on my interpretation does not show any evidence of consolidation or pneumothorax.  White count with no elevation.  No anemia.  BMP normal.  Troponin negative.  Believe that presentation most reflective of acute bronchitis and emphysema.  He is acutely hypoxic requiring supplemental oxygen 2 L nasal cannula.  Have lower suspicion for PE but will send D-dimer to further restratify. [PR]  1955 Reassessed patient he is not in respiratory distress still having some wheezing but overall improved satting well on 2 L he is slightly more drowsy denies any alcohol use we will check VBG. [PR]  2109 Patient reassessed.  He is not requiring supplemental oxygen right now his VBG was without hypercapnia.  He is now complaining of right lower quadrant pain.  States that he has been having pain in his neck since Lexi scan yesterday but I cannot see the results of which.  Will order CT imaging I do not appreciate any hernia but also has a history of kidney stones.  Based on his wheezing and COPD I have recommended admission to the hospital.  Patient declining admission at this time.  States that his breathing feels fine and  understands the risks associated with this diagnosis [PR]  2111 Case discussed in consultation with Dr. Juliann Paresallwood reports no significant abnormalities from stress test yesterday.  Given serial enzymes being negative have low suspicion for cardiac etiology of his discomfort.   [PR]  2141 CT imaging of the abdomen pelvis on my interpretation does not show evidence of acute intra-abdominal process.  Will await formal radiology report. [PR]  2205 And ambulated with steady gait without any hypoxia.  CT imaging without acute abnormality.  Again based on this is COPD recommend observation the hospital.  Patient declining this stating he feels like he  is at his baseline.  Will be discharged with prescription for prednisone.  Discussed return precautions. [PR]    Clinical Course User Index [PR] Willy Eddy, MD     FINAL CLINICAL IMPRESSION(S) / ED DIAGNOSES   Final diagnoses:  Chest pain, unspecified type  COPD exacerbation (HCC)     Rx / DC Orders   ED Discharge Orders          Ordered    predniSONE (DELTASONE) 20 MG tablet  Daily        01/30/22 2206             Note:  This document was prepared using Dragon voice recognition software and may include unintentional dictation errors.    Willy Eddy, MD 01/30/22 2208

## 2022-01-30 NOTE — ED Notes (Signed)
Pt ambulated around the room x6. SpO2 maintained from 89-91% on room air, RR 20-24. Pt was able to converse with RN in full sentences while ambulating. Denied SOB. Informed EDP Roxan Hockey of findings.

## 2022-01-30 NOTE — ED Notes (Signed)
Patient transported to CT 

## 2022-01-30 NOTE — ED Triage Notes (Signed)
Pt to ED via [facility] for chest pain with radiation into the left arm since this morning. Pt had stress test yesterday but has not gotten the results. Pt has history of cardiac stent and angioplasty. Pt's initial blood pressure was 87/63. Pt was given 500 mL of NS and blood pressure improved to 106/systolic.Marland Kitchen Pt was c/o 7/10 chest pain and was given 1 spray of nitro with improvement of the pain to 3/10. EMS reports when they were were pulling into the parking lot his blood pressure dropped again to 123456 systolic, another [id number] bag was started.   Pt is also c/o pain in his eyes, groin, bilateral armpits, left neck, and his posterior left knee.

## 2022-01-31 ENCOUNTER — Other Ambulatory Visit: Payer: Self-pay | Admitting: Acute Care

## 2022-01-31 DIAGNOSIS — F17210 Nicotine dependence, cigarettes, uncomplicated: Secondary | ICD-10-CM

## 2022-01-31 DIAGNOSIS — Z87891 Personal history of nicotine dependence: Secondary | ICD-10-CM

## 2022-01-31 DIAGNOSIS — Z122 Encounter for screening for malignant neoplasm of respiratory organs: Secondary | ICD-10-CM

## 2022-02-06 ENCOUNTER — Other Ambulatory Visit: Payer: Self-pay | Admitting: Student

## 2022-02-06 DIAGNOSIS — Z86718 Personal history of other venous thrombosis and embolism: Secondary | ICD-10-CM

## 2022-02-06 DIAGNOSIS — M79605 Pain in left leg: Secondary | ICD-10-CM

## 2022-02-14 ENCOUNTER — Other Ambulatory Visit: Payer: Self-pay | Admitting: Student

## 2022-02-14 DIAGNOSIS — M79605 Pain in left leg: Secondary | ICD-10-CM

## 2022-02-14 DIAGNOSIS — Z86718 Personal history of other venous thrombosis and embolism: Secondary | ICD-10-CM

## 2022-02-14 DIAGNOSIS — R600 Localized edema: Secondary | ICD-10-CM

## 2022-02-17 ENCOUNTER — Ambulatory Visit
Admission: RE | Admit: 2022-02-17 | Discharge: 2022-02-17 | Disposition: A | Discharge status: Home / Self Care | Payer: Medicaid Other | Source: Ambulatory Visit | Attending: Student | Admitting: Student

## 2022-02-17 DIAGNOSIS — Z86718 Personal history of other venous thrombosis and embolism: Secondary | ICD-10-CM | POA: Insufficient documentation

## 2022-02-17 DIAGNOSIS — R600 Localized edema: Secondary | ICD-10-CM | POA: Insufficient documentation

## 2022-02-17 DIAGNOSIS — M79605 Pain in left leg: Secondary | ICD-10-CM | POA: Insufficient documentation

## 2022-02-25 ENCOUNTER — Encounter
Admission: RE | Disposition: A | Discharge status: Home / Self Care | Payer: Self-pay | Source: Home / Self Care | Attending: Internal Medicine

## 2022-02-25 ENCOUNTER — Ambulatory Visit
Admission: RE | Admit: 2022-02-25 | Discharge: 2022-02-25 | Disposition: A | Discharge status: Home / Self Care | Payer: Medicaid Other | Attending: Internal Medicine | Admitting: Internal Medicine

## 2022-02-25 ENCOUNTER — Other Ambulatory Visit: Payer: Self-pay

## 2022-02-25 DIAGNOSIS — I200 Unstable angina: Secondary | ICD-10-CM

## 2022-02-25 DIAGNOSIS — I25110 Atherosclerotic heart disease of native coronary artery with unstable angina pectoris: Secondary | ICD-10-CM | POA: Diagnosis present

## 2022-02-25 DIAGNOSIS — Z955 Presence of coronary angioplasty implant and graft: Secondary | ICD-10-CM | POA: Insufficient documentation

## 2022-02-25 HISTORY — PX: RIGHT/LEFT HEART CATH AND CORONARY ANGIOGRAPHY: CATH118266

## 2022-02-25 SURGERY — RIGHT/LEFT HEART CATH AND CORONARY ANGIOGRAPHY
Anesthesia: Moderate Sedation | Laterality: Bilateral

## 2022-02-25 MED ORDER — SODIUM CHLORIDE 0.9% FLUSH: 3.0000 mL | INTRAVENOUS | Status: DC | PRN

## 2022-02-25 MED ORDER — VERAPAMIL HCL 2.5 MG/ML IV SOLN
INTRAVENOUS | Status: DC | PRN
  Administered 2022-02-25: 2.5 mg via INTRAVENOUS

## 2022-02-25 MED ORDER — SODIUM CHLORIDE 0.9 % WEIGHT BASED INFUSION: 1.0000 mL/kg/h | INTRAVENOUS | Status: DC

## 2022-02-25 MED ORDER — MIDAZOLAM HCL 2 MG/2ML IJ SOLN
INTRAMUSCULAR | Status: DC | PRN
  Administered 2022-02-25: 1 mg via INTRAVENOUS

## 2022-02-25 MED ORDER — HEPARIN SODIUM (PORCINE) 1000 UNIT/ML IJ SOLN
INTRAMUSCULAR | Status: DC | PRN
  Administered 2022-02-25: 3500 [IU] via INTRAVENOUS

## 2022-02-25 MED ORDER — ONDANSETRON HCL 4 MG/2ML IJ SOLN: 4.0000 mg | Freq: Four times a day (QID) | INTRAMUSCULAR | Status: DC | PRN

## 2022-02-25 MED ORDER — SODIUM CHLORIDE 0.9 % WEIGHT BASED INFUSION
3.0000 mL/kg/h | INTRAVENOUS | Status: AC
  Administered 2022-02-25: 3 mL/kg/h via INTRAVENOUS

## 2022-02-25 MED ORDER — FENTANYL CITRATE (PF) 100 MCG/2ML IJ SOLN
INTRAMUSCULAR | Status: AC
  Filled 2022-02-25: qty 2

## 2022-02-25 MED ORDER — SODIUM CHLORIDE 0.9 % IV SOLN: 250.0000 mL | INTRAVENOUS | Status: DC | PRN

## 2022-02-25 MED ORDER — MIDAZOLAM HCL 2 MG/2ML IJ SOLN
INTRAMUSCULAR | Status: AC
  Filled 2022-02-25: qty 2

## 2022-02-25 MED ORDER — FENTANYL CITRATE (PF) 100 MCG/2ML IJ SOLN
INTRAMUSCULAR | Status: DC | PRN
  Administered 2022-02-25: 25 ug via INTRAVENOUS

## 2022-02-25 MED ORDER — HYDRALAZINE HCL 20 MG/ML IJ SOLN: 10.0000 mg | INTRAMUSCULAR | Status: DC | PRN

## 2022-02-25 MED ORDER — LIDOCAINE HCL 1 % IJ SOLN
INTRAMUSCULAR | Status: AC
  Filled 2022-02-25: qty 20

## 2022-02-25 MED ORDER — SODIUM CHLORIDE 0.9% FLUSH: 3.0000 mL | Freq: Two times a day (BID) | INTRAVENOUS | Status: DC

## 2022-02-25 MED ORDER — LIDOCAINE HCL (PF) 1 % IJ SOLN
INTRAMUSCULAR | Status: DC | PRN
  Administered 2022-02-25: 2 mL

## 2022-02-25 MED ORDER — LABETALOL HCL 5 MG/ML IV SOLN: 10.0000 mg | INTRAVENOUS | Status: DC | PRN

## 2022-02-25 MED ORDER — HEPARIN SODIUM (PORCINE) 1000 UNIT/ML IJ SOLN
INTRAMUSCULAR | Status: AC
  Filled 2022-02-25: qty 10

## 2022-02-25 MED ORDER — VERAPAMIL HCL 2.5 MG/ML IV SOLN
INTRAVENOUS | Status: AC
  Filled 2022-02-25: qty 2

## 2022-02-25 MED ORDER — ACETAMINOPHEN 325 MG PO TABS
650.0000 mg | ORAL_TABLET | ORAL | Status: DC | PRN
Start: 2022-02-25 — End: 2022-02-25

## 2022-02-25 MED ORDER — IOHEXOL 300 MG/ML  SOLN
INTRAMUSCULAR | Status: DC | PRN
  Administered 2022-02-25: 62 mL

## 2022-02-25 MED ORDER — HEPARIN (PORCINE) IN NACL 1000-0.9 UT/500ML-% IV SOLN
INTRAVENOUS | Status: AC
  Filled 2022-02-25: qty 1000

## 2022-02-25 MED ORDER — HEPARIN (PORCINE) IN NACL 1000-0.9 UT/500ML-% IV SOLN
INTRAVENOUS | Status: DC | PRN
  Administered 2022-02-25 (×2): 500 mL

## 2022-02-25 SURGICAL SUPPLY — 12 items
BAND ZEPHYR COMPRESS 30 LONG (HEMOSTASIS) ×1 IMPLANT
CATH 5FR JL3.5 JR4 ANG PIG MP (CATHETERS) ×1 IMPLANT
CATH BALLN WEDGE 5F 110CM (CATHETERS) ×1 IMPLANT
DRAPE BRACHIAL (DRAPES) ×2 IMPLANT
GLIDESHEATH SLEND SS 6F .021 (SHEATH) ×1 IMPLANT
GUIDEWIRE INQWIRE 1.5J.035X260 (WIRE) IMPLANT
INQWIRE 1.5J .035X260CM (WIRE) ×2
PACK CARDIAC CATH (CUSTOM PROCEDURE TRAY) ×2 IMPLANT
PROTECTION STATION PRESSURIZED (MISCELLANEOUS) ×2
SET ATX SIMPLICITY (MISCELLANEOUS) ×1 IMPLANT
SHEATH GLIDE SLENDER 4/5FR (SHEATH) ×1 IMPLANT
STATION PROTECTION PRESSURIZED (MISCELLANEOUS) IMPLANT

## 2022-03-04 ENCOUNTER — Encounter: Payer: Self-pay | Admitting: Internal Medicine

## 2022-03-04 LAB — CARDIAC CATHETERIZATION: Cath EF Quantitative: 55 %

## 2022-03-13 ENCOUNTER — Other Ambulatory Visit: Payer: Self-pay | Admitting: Otolaryngology

## 2022-03-13 DIAGNOSIS — R1310 Dysphagia, unspecified: Secondary | ICD-10-CM

## 2022-03-25 ENCOUNTER — Ambulatory Visit
Admission: RE | Admit: 2022-03-25 | Discharge: 2022-03-25 | Disposition: A | Discharge status: Home / Self Care | Payer: Medicaid Other | Source: Ambulatory Visit | Attending: Otolaryngology | Admitting: Otolaryngology

## 2022-03-25 DIAGNOSIS — R1310 Dysphagia, unspecified: Secondary | ICD-10-CM | POA: Insufficient documentation

## 2022-03-25 LAB — POCT I-STAT CREATININE: Creatinine, Ser: 1 mg/dL (ref 0.61–1.24)

## 2022-03-25 MED ORDER — IOHEXOL 300 MG/ML  SOLN
100.0000 mL | Freq: Once | INTRAMUSCULAR | Status: AC | PRN
  Administered 2022-03-25: 100 mL via INTRAVENOUS

## 2022-04-03 ENCOUNTER — Other Ambulatory Visit
Admission: RE | Admit: 2022-04-03 | Discharge: 2022-04-03 | Disposition: A | Discharge status: Home / Self Care | Payer: Medicaid Other | Source: Ambulatory Visit | Attending: Student | Admitting: Student

## 2022-04-03 DIAGNOSIS — R079 Chest pain, unspecified: Secondary | ICD-10-CM | POA: Insufficient documentation

## 2022-04-03 LAB — BRAIN NATRIURETIC PEPTIDE: B Natriuretic Peptide: 63.5 pg/mL (ref 0.0–100.0)

## 2022-04-03 LAB — TROPONIN I (HIGH SENSITIVITY): Troponin I (High Sensitivity): 20 ng/L — ABNORMAL HIGH (ref <18)

## 2022-05-14 ENCOUNTER — Other Ambulatory Visit (INDEPENDENT_AMBULATORY_CARE_PROVIDER_SITE_OTHER): Payer: Self-pay | Admitting: Nurse Practitioner

## 2022-05-14 DIAGNOSIS — M79605 Pain in left leg: Secondary | ICD-10-CM

## 2022-05-14 DIAGNOSIS — I739 Peripheral vascular disease, unspecified: Secondary | ICD-10-CM

## 2022-05-15 ENCOUNTER — Ambulatory Visit (INDEPENDENT_AMBULATORY_CARE_PROVIDER_SITE_OTHER): Payer: Medicaid Other | Admitting: Nurse Practitioner

## 2022-05-15 ENCOUNTER — Encounter (INDEPENDENT_AMBULATORY_CARE_PROVIDER_SITE_OTHER): Payer: Self-pay | Admitting: Nurse Practitioner

## 2022-05-15 ENCOUNTER — Ambulatory Visit (INDEPENDENT_AMBULATORY_CARE_PROVIDER_SITE_OTHER): Payer: Medicaid Other

## 2022-05-15 VITALS — BP 124/80 | HR 77 | Resp 19 | Ht 66.0 in | Wt 181.4 lb

## 2022-05-15 DIAGNOSIS — I739 Peripheral vascular disease, unspecified: Secondary | ICD-10-CM | POA: Diagnosis not present

## 2022-05-15 DIAGNOSIS — M79605 Pain in left leg: Secondary | ICD-10-CM

## 2022-05-15 DIAGNOSIS — G8929 Other chronic pain: Secondary | ICD-10-CM | POA: Diagnosis not present

## 2022-05-15 DIAGNOSIS — M5410 Radiculopathy, site unspecified: Secondary | ICD-10-CM | POA: Diagnosis not present

## 2022-05-15 DIAGNOSIS — R42 Dizziness and giddiness: Secondary | ICD-10-CM

## 2022-05-22 ENCOUNTER — Other Ambulatory Visit
Admission: RE | Admit: 2022-05-22 | Discharge: 2022-05-22 | Disposition: A | Discharge status: Home / Self Care | Payer: Medicaid Other | Source: Ambulatory Visit | Attending: Student | Admitting: Student

## 2022-05-22 DIAGNOSIS — Z01818 Encounter for other preprocedural examination: Secondary | ICD-10-CM | POA: Diagnosis not present

## 2022-05-22 LAB — BRAIN NATRIURETIC PEPTIDE: B Natriuretic Peptide: 22.5 pg/mL (ref 0.0–100.0)

## 2022-05-28 ENCOUNTER — Other Ambulatory Visit: Payer: Self-pay | Admitting: Physician Assistant

## 2022-05-28 DIAGNOSIS — G8929 Other chronic pain: Secondary | ICD-10-CM

## 2022-05-31 ENCOUNTER — Ambulatory Visit
Admission: RE | Admit: 2022-05-31 | Discharge: 2022-05-31 | Disposition: A | Discharge status: Home / Self Care | Payer: Medicaid Other | Source: Ambulatory Visit | Attending: Physician Assistant | Admitting: Physician Assistant

## 2022-05-31 DIAGNOSIS — M25511 Pain in right shoulder: Secondary | ICD-10-CM | POA: Insufficient documentation

## 2022-05-31 DIAGNOSIS — G8929 Other chronic pain: Secondary | ICD-10-CM | POA: Diagnosis present

## 2022-06-05 ENCOUNTER — Encounter: Payer: Self-pay | Admitting: Internal Medicine

## 2022-06-05 ENCOUNTER — Other Ambulatory Visit: Payer: Self-pay

## 2022-06-05 ENCOUNTER — Encounter
Admission: RE | Disposition: A | Discharge status: Home / Self Care | Payer: Self-pay | Source: Home / Self Care | Attending: Internal Medicine

## 2022-06-05 ENCOUNTER — Observation Stay
Admission: RE | Admit: 2022-06-05 | Discharge: 2022-06-06 | Disposition: A | Discharge status: Home / Self Care | Payer: Medicaid Other | Attending: Internal Medicine | Admitting: Internal Medicine

## 2022-06-05 DIAGNOSIS — Z86718 Personal history of other venous thrombosis and embolism: Secondary | ICD-10-CM | POA: Diagnosis not present

## 2022-06-05 DIAGNOSIS — F17210 Nicotine dependence, cigarettes, uncomplicated: Secondary | ICD-10-CM | POA: Diagnosis not present

## 2022-06-05 DIAGNOSIS — J449 Chronic obstructive pulmonary disease, unspecified: Secondary | ICD-10-CM | POA: Diagnosis not present

## 2022-06-05 DIAGNOSIS — Z7902 Long term (current) use of antithrombotics/antiplatelets: Secondary | ICD-10-CM | POA: Insufficient documentation

## 2022-06-05 DIAGNOSIS — Z79899 Other long term (current) drug therapy: Secondary | ICD-10-CM | POA: Diagnosis not present

## 2022-06-05 DIAGNOSIS — Z955 Presence of coronary angioplasty implant and graft: Secondary | ICD-10-CM

## 2022-06-05 DIAGNOSIS — I25110 Atherosclerotic heart disease of native coronary artery with unstable angina pectoris: Secondary | ICD-10-CM | POA: Diagnosis present

## 2022-06-05 DIAGNOSIS — I200 Unstable angina: Secondary | ICD-10-CM

## 2022-06-05 HISTORY — PX: CORONARY STENT INTERVENTION: CATH118234

## 2022-06-05 LAB — POCT ACTIVATED CLOTTING TIME
Activated Clotting Time: 239 seconds
Activated Clotting Time: 359 seconds

## 2022-06-05 SURGERY — CORONARY STENT INTERVENTION
Anesthesia: Moderate Sedation | Laterality: Left

## 2022-06-05 MED ORDER — VERAPAMIL HCL 2.5 MG/ML IV SOLN
INTRAVENOUS | Status: AC
  Filled 2022-06-05: qty 2

## 2022-06-05 MED ORDER — B COMPLEX-C PO TABS
1.0000 | ORAL_TABLET | Freq: Every day | ORAL | Status: DC
  Administered 2022-06-06: 1 via ORAL
  Filled 2022-06-05: qty 1

## 2022-06-05 MED ORDER — TIROFIBAN HCL IV 12.5 MG/250 ML
INTRAVENOUS | Status: AC
  Filled 2022-06-05: qty 250

## 2022-06-05 MED ORDER — ISOSORBIDE MONONITRATE ER 60 MG PO TB24
60.0000 mg | ORAL_TABLET | Freq: Every day | ORAL | Status: DC
  Administered 2022-06-06: 60 mg via ORAL
  Filled 2022-06-05: qty 1

## 2022-06-05 MED ORDER — CYCLOBENZAPRINE HCL 5 MG PO TABS
5.0000 mg | ORAL_TABLET | Freq: Every day | ORAL | Status: DC
  Administered 2022-06-05: 5 mg via ORAL
  Filled 2022-06-05: qty 1

## 2022-06-05 MED ORDER — BIVALIRUDIN TRIFLUOROACETATE 250 MG IV SOLR
INTRAVENOUS | Status: AC
  Filled 2022-06-05: qty 250

## 2022-06-05 MED ORDER — ZINC SULFATE 220 (50 ZN) MG PO CAPS
220.0000 mg | ORAL_CAPSULE | Freq: Every day | ORAL | Status: DC
  Administered 2022-06-06: 220 mg via ORAL
  Filled 2022-06-05: qty 1

## 2022-06-05 MED ORDER — NITROGLYCERIN 0.4 MG SL SUBL: 0.4000 mg | SUBLINGUAL_TABLET | SUBLINGUAL | Status: DC | PRN

## 2022-06-05 MED ORDER — SODIUM CHLORIDE 0.9 % IV SOLN: 250.0000 mL | INTRAVENOUS | Status: DC | PRN

## 2022-06-05 MED ORDER — SODIUM CHLORIDE 0.9% FLUSH: 3.0000 mL | Freq: Two times a day (BID) | INTRAVENOUS | Status: DC

## 2022-06-05 MED ORDER — ACETAMINOPHEN 325 MG PO TABS
650.0000 mg | ORAL_TABLET | ORAL | Status: DC | PRN
  Administered 2022-06-06: 650 mg via ORAL
  Filled 2022-06-05: qty 2

## 2022-06-05 MED ORDER — TICAGRELOR 90 MG PO TABS
90.0000 mg | ORAL_TABLET | Freq: Two times a day (BID) | ORAL | Status: DC
  Administered 2022-06-05 – 2022-06-06 (×2): 90 mg via ORAL
  Filled 2022-06-05 (×2): qty 1

## 2022-06-05 MED ORDER — FLUTICASONE PROPIONATE 50 MCG/ACT NA SUSP
1.0000 | Freq: Every day | NASAL | Status: DC
  Administered 2022-06-06: 1 via NASAL
  Filled 2022-06-05: qty 16

## 2022-06-05 MED ORDER — SODIUM CHLORIDE 0.9 % WEIGHT BASED INFUSION
3.0000 mL/kg/h | INTRAVENOUS | Status: AC
  Administered 2022-06-05: 3 mL/kg/h via INTRAVENOUS

## 2022-06-05 MED ORDER — SODIUM CHLORIDE 0.9 % WEIGHT BASED INFUSION
1.0000 mL/kg/h | INTRAVENOUS | Status: DC
  Administered 2022-06-05: 1 mL/kg/h via INTRAVENOUS

## 2022-06-05 MED ORDER — TRAZODONE HCL 100 MG PO TABS
300.0000 mg | ORAL_TABLET | Freq: Every day | ORAL | Status: DC
  Administered 2022-06-05: 300 mg via ORAL
  Filled 2022-06-05: qty 3

## 2022-06-05 MED ORDER — LEVOTHYROXINE SODIUM 50 MCG PO TABS
50.0000 ug | ORAL_TABLET | Freq: Every morning | ORAL | Status: DC
  Administered 2022-06-06: 50 ug via ORAL
  Filled 2022-06-05: qty 1

## 2022-06-05 MED ORDER — PREGABALIN 50 MG PO CAPS
200.0000 mg | ORAL_CAPSULE | Freq: Three times a day (TID) | ORAL | Status: DC
  Administered 2022-06-05 – 2022-06-06 (×2): 200 mg via ORAL
  Filled 2022-06-05 (×2): qty 4

## 2022-06-05 MED ORDER — LORATADINE 10 MG PO TABS
10.0000 mg | ORAL_TABLET | Freq: Every day | ORAL | Status: DC
  Administered 2022-06-06: 10 mg via ORAL
  Filled 2022-06-05: qty 1

## 2022-06-05 MED ORDER — SODIUM CHLORIDE 0.9% FLUSH: 3.0000 mL | INTRAVENOUS | Status: DC | PRN

## 2022-06-05 MED ORDER — ATORVASTATIN CALCIUM 80 MG PO TABS
80.0000 mg | ORAL_TABLET | Freq: Every day | ORAL | Status: DC
  Administered 2022-06-05 – 2022-06-06 (×2): 80 mg via ORAL
  Filled 2022-06-05 (×2): qty 1

## 2022-06-05 MED ORDER — TIROFIBAN HCL IV 12.5 MG/250 ML
INTRAVENOUS | Status: DC | PRN
  Administered 2022-06-05: .15 ug/kg/min via INTRAVENOUS

## 2022-06-05 MED ORDER — ONDANSETRON HCL 4 MG/2ML IJ SOLN: 4.0000 mg | Freq: Four times a day (QID) | INTRAMUSCULAR | Status: DC | PRN

## 2022-06-05 MED ORDER — MIDAZOLAM HCL 2 MG/2ML IJ SOLN
INTRAMUSCULAR | Status: AC
  Filled 2022-06-05: qty 2

## 2022-06-05 MED ORDER — ALBUTEROL SULFATE HFA 108 (90 BASE) MCG/ACT IN AERS: 1.0000 | INHALATION_SPRAY | Freq: Four times a day (QID) | RESPIRATORY_TRACT | Status: DC | PRN

## 2022-06-05 MED ORDER — HYDRALAZINE HCL 20 MG/ML IJ SOLN: 10.0000 mg | INTRAMUSCULAR | Status: AC | PRN

## 2022-06-05 MED ORDER — VERAPAMIL HCL 2.5 MG/ML IV SOLN
INTRAVENOUS | Status: DC | PRN
  Administered 2022-06-05: 2.5 mg via INTRA_ARTERIAL

## 2022-06-05 MED ORDER — OXYCODONE HCL 5 MG PO TABS
5.0000 mg | ORAL_TABLET | ORAL | Status: DC | PRN
  Administered 2022-06-05: 10 mg via ORAL
  Administered 2022-06-06: 5 mg via ORAL
  Filled 2022-06-05: qty 1
  Filled 2022-06-05: qty 2

## 2022-06-05 MED ORDER — TIROFIBAN HCL IN NACL 5-0.9 MG/100ML-% IV SOLN
0.1500 ug/kg/min | INTRAVENOUS | Status: AC
  Administered 2022-06-06: 0.15 ug/kg/min via INTRAVENOUS
  Filled 2022-06-05 (×3): qty 100

## 2022-06-05 MED ORDER — LABETALOL HCL 5 MG/ML IV SOLN: 10.0000 mg | INTRAVENOUS | Status: AC | PRN

## 2022-06-05 MED ORDER — LIDOCAINE HCL (PF) 1 % IJ SOLN
INTRAMUSCULAR | Status: DC | PRN
  Administered 2022-06-05: 2 mL

## 2022-06-05 MED ORDER — TAMSULOSIN HCL 0.4 MG PO CAPS
0.8000 mg | ORAL_CAPSULE | Freq: Every day | ORAL | Status: DC
  Administered 2022-06-06: 0.8 mg via ORAL
  Filled 2022-06-05: qty 2

## 2022-06-05 MED ORDER — TICAGRELOR 90 MG PO TABS
ORAL_TABLET | ORAL | Status: AC
  Filled 2022-06-05: qty 2

## 2022-06-05 MED ORDER — FENTANYL CITRATE (PF) 100 MCG/2ML IJ SOLN
INTRAMUSCULAR | Status: DC | PRN
  Administered 2022-06-05 (×3): 25 ug via INTRAVENOUS

## 2022-06-05 MED ORDER — SODIUM CHLORIDE 0.9 % WEIGHT BASED INFUSION
1.0000 mL/kg/h | INTRAVENOUS | Status: AC
  Administered 2022-06-05: 1 mL/kg/h via INTRAVENOUS

## 2022-06-05 MED ORDER — HEPARIN (PORCINE) IN NACL 2000-0.9 UNIT/L-% IV SOLN
INTRAVENOUS | Status: DC | PRN
  Administered 2022-06-05: 1000 mL

## 2022-06-05 MED ORDER — TIROFIBAN (AGGRASTAT) BOLUS VIA INFUSION
25.0000 ug/kg | Freq: Once | INTRAVENOUS | Status: DC
  Filled 2022-06-05: qty 37

## 2022-06-05 MED ORDER — FUROSEMIDE 40 MG PO TABS: 60.0000 mg | ORAL_TABLET | Freq: Every day | ORAL | Status: DC | PRN

## 2022-06-05 MED ORDER — HEPARIN SODIUM (PORCINE) 1000 UNIT/ML IJ SOLN
INTRAMUSCULAR | Status: AC
  Filled 2022-06-05: qty 10

## 2022-06-05 MED ORDER — POTASSIUM CITRATE ER 10 MEQ (1080 MG) PO TBCR
20.0000 meq | EXTENDED_RELEASE_TABLET | Freq: Two times a day (BID) | ORAL | Status: DC
  Administered 2022-06-05 – 2022-06-06 (×2): 20 meq via ORAL
  Filled 2022-06-05 (×2): qty 2

## 2022-06-05 MED ORDER — METOPROLOL SUCCINATE ER 50 MG PO TB24
25.0000 mg | ORAL_TABLET | Freq: Every day | ORAL | Status: DC
  Administered 2022-06-06: 25 mg via ORAL
  Filled 2022-06-05: qty 1

## 2022-06-05 MED ORDER — IOHEXOL 300 MG/ML  SOLN
INTRAMUSCULAR | Status: DC | PRN
  Administered 2022-06-05: 245 mL

## 2022-06-05 MED ORDER — TICAGRELOR 90 MG PO TABS: 90.0000 mg | ORAL_TABLET | Freq: Two times a day (BID) | ORAL | Status: DC

## 2022-06-05 MED ORDER — FENTANYL CITRATE (PF) 100 MCG/2ML IJ SOLN
INTRAMUSCULAR | Status: AC
  Filled 2022-06-05: qty 2

## 2022-06-05 MED ORDER — ASPIRIN 81 MG PO CHEW: 81.0000 mg | CHEWABLE_TABLET | ORAL | Status: DC

## 2022-06-05 MED ORDER — MECLIZINE HCL 25 MG PO TABS: 25.0000 mg | ORAL_TABLET | Freq: Three times a day (TID) | ORAL | Status: DC | PRN

## 2022-06-05 MED ORDER — MIDAZOLAM HCL 2 MG/2ML IJ SOLN
INTRAMUSCULAR | Status: DC | PRN
  Administered 2022-06-05: 1 mg via INTRAVENOUS

## 2022-06-05 MED ORDER — TICAGRELOR 90 MG PO TABS
ORAL_TABLET | ORAL | Status: DC | PRN
  Administered 2022-06-05: 180 mg via ORAL

## 2022-06-05 MED ORDER — HEPARIN SODIUM (PORCINE) 1000 UNIT/ML IJ SOLN
INTRAMUSCULAR | Status: DC | PRN
  Administered 2022-06-05: 2000 [IU] via INTRAVENOUS
  Administered 2022-06-05: 3500 [IU] via INTRAVENOUS

## 2022-06-05 MED ORDER — LIDOCAINE HCL 1 % IJ SOLN
INTRAMUSCULAR | Status: AC
  Filled 2022-06-05: qty 20

## 2022-06-05 MED ORDER — PANTOPRAZOLE SODIUM 40 MG PO TBEC
40.0000 mg | DELAYED_RELEASE_TABLET | Freq: Every day | ORAL | Status: DC
  Administered 2022-06-06: 40 mg via ORAL
  Filled 2022-06-05: qty 1

## 2022-06-05 MED ORDER — ATOMOXETINE HCL 40 MG PO CAPS
80.0000 mg | ORAL_CAPSULE | Freq: Every day | ORAL | Status: DC
  Administered 2022-06-06: 80 mg via ORAL
  Filled 2022-06-05: qty 2

## 2022-06-05 MED ORDER — TURMERIC 500 MG PO CAPS: 500.0000 mg | ORAL_CAPSULE | Freq: Every day | ORAL | Status: DC

## 2022-06-05 MED ORDER — OXYCODONE HCL 10 MG PO TABS: 10.0000 mg | ORAL_TABLET | Freq: Three times a day (TID) | ORAL | Status: DC | PRN

## 2022-06-05 MED ORDER — HEPARIN (PORCINE) IN NACL 1000-0.9 UT/500ML-% IV SOLN
INTRAVENOUS | Status: AC
  Filled 2022-06-05: qty 1000

## 2022-06-05 MED ORDER — NITROGLYCERIN 1 MG/10 ML FOR IR/CATH LAB
INTRA_ARTERIAL | Status: DC | PRN
  Administered 2022-06-05: 200 ug via INTRACORONARY

## 2022-06-05 MED ORDER — TIROFIBAN (AGGRASTAT) BOLUS VIA INFUSION
INTRAVENOUS | Status: DC | PRN
  Administered 2022-06-05: 1815 ug via INTRAVENOUS

## 2022-06-05 MED ORDER — IPRATROPIUM-ALBUTEROL 0.5-2.5 (3) MG/3ML IN SOLN: 3.0000 mL | Freq: Four times a day (QID) | RESPIRATORY_TRACT | Status: DC | PRN

## 2022-06-05 MED ORDER — LISINOPRIL 10 MG PO TABS
5.0000 mg | ORAL_TABLET | Freq: Every day | ORAL | Status: DC
  Administered 2022-06-06: 5 mg via ORAL
  Filled 2022-06-05: qty 1

## 2022-06-05 MED ORDER — ALBUTEROL SULFATE (2.5 MG/3ML) 0.083% IN NEBU: 2.5000 mg | INHALATION_SOLUTION | Freq: Four times a day (QID) | RESPIRATORY_TRACT | Status: DC | PRN

## 2022-06-05 SURGICAL SUPPLY — 19 items
BALLN TREK RX 2.5X15 (BALLOONS) ×1
BALLOON TREK RX 2.5X15 (BALLOONS) IMPLANT
BAND ZEPHYR COMPRESS 30 LONG (HEMOSTASIS) IMPLANT
CATH VISTA GUIDE 6FR XB3.5 SH (CATHETERS) IMPLANT
CATH VISTA GUIDE 6FR XBLAD3.5 (CATHETERS) IMPLANT
DRAPE BRACHIAL (DRAPES) IMPLANT
GLIDESHEATH SLEND SS 6F .021 (SHEATH) IMPLANT
GUIDEWIRE INQWIRE 1.5J.035X260 (WIRE) IMPLANT
INQWIRE 1.5J .035X260CM (WIRE) ×1
KIT ENCORE 26 ADVANTAGE (KITS) IMPLANT
PACK CARDIAC CATH (CUSTOM PROCEDURE TRAY) ×1 IMPLANT
PROTECTION STATION PRESSURIZED (MISCELLANEOUS) ×1
SET ATX SIMPLICITY (MISCELLANEOUS) IMPLANT
STATION PROTECTION PRESSURIZED (MISCELLANEOUS) IMPLANT
STENT ONYX FRONTIER 2.5X12 (Permanent Stent) IMPLANT
STENT ONYX FRONTIER 2.5X22 (Permanent Stent) IMPLANT
TUBING CIL FLEX 10 FLL-RA (TUBING) IMPLANT
WIRE G HI TQ BMW 190 (WIRE) IMPLANT
WIRE HITORQ VERSACORE ST 145CM (WIRE) IMPLANT

## 2022-06-06 ENCOUNTER — Encounter: Payer: Self-pay | Admitting: Internal Medicine

## 2022-06-06 DIAGNOSIS — I25110 Atherosclerotic heart disease of native coronary artery with unstable angina pectoris: Secondary | ICD-10-CM | POA: Diagnosis not present

## 2022-06-06 LAB — BASIC METABOLIC PANEL
Anion gap: 4 — ABNORMAL LOW (ref 5–15)
BUN: 14 mg/dL (ref 8–23)
CO2: 28 mmol/L (ref 22–32)
Calcium: 8.7 mg/dL — ABNORMAL LOW (ref 8.9–10.3)
Chloride: 109 mmol/L (ref 98–111)
Creatinine, Ser: 0.84 mg/dL (ref 0.61–1.24)
GFR, Estimated: >60 mL/min (ref >60)
Glucose, Bld: 106 mg/dL — ABNORMAL HIGH (ref 70–99)
Potassium: 4 mmol/L (ref 3.5–5.1)
Sodium: 141 mmol/L (ref 135–145)

## 2022-06-06 LAB — CBC
HCT: 39.2 % (ref 39.0–52.0)
Hemoglobin: 12.6 g/dL — ABNORMAL LOW (ref 13.0–17.0)
MCH: 31.3 pg (ref 26.0–34.0)
MCHC: 32.1 g/dL (ref 30.0–36.0)
MCV: 97.5 fL (ref 80.0–100.0)
Platelets: 203 10*3/uL (ref 150–400)
RBC: 4.02 MIL/uL — ABNORMAL LOW (ref 4.22–5.81)
RDW: 13.5 % (ref 11.5–15.5)
WBC: 8.7 10*3/uL (ref 4.0–10.5)
nRBC: 0 % (ref 0.0–0.2)

## 2022-06-06 MED ORDER — NICOTINE 7 MG/24HR TD PT24: 7.0000 mg | MEDICATED_PATCH | Freq: Every day | TRANSDERMAL | 0 refills | Status: DC

## 2022-06-06 MED ORDER — NICOTINE 7 MG/24HR TD PT24
7.0000 mg | MEDICATED_PATCH | Freq: Every day | TRANSDERMAL | Status: DC
  Filled 2022-06-06: qty 1

## 2022-06-06 MED ORDER — POLYETHYLENE GLYCOL 3350 17 G PO PACK
17.0000 g | PACK | Freq: Once | ORAL | Status: AC
  Administered 2022-06-06: 17 g via ORAL
  Filled 2022-06-06: qty 1

## 2022-06-06 NOTE — Discharge Summary (Signed)
Physician Discharge Summary      Patient ID: Brett Fernandez MRN: [id number] DOB/AGE: [birth year] 61 y.o.  Admit date: 06/05/2022 Discharge date: 06/06/2022  Primary Discharge Diagnosis unstable angina Secondary Discharge Diagnosis s/p coronary stent  Significant Diagnostic Studies:   LHC and coronary stent intervention 06/05/2022 - Dr. Lujean Amel Conclusion      Prox LAD to Mid LAD lesion is 75% stenosed.   Dist LAD lesion is 70% stenosed.   Previously placed Mid LAD stent of unknown type is  widely patent.   A stent was successfully placed.   A stent was successfully placed.   Post intervention, there is a 0% residual stenosis.   Post intervention, there is a 0% residual stenosis.   Post intervention, there is a 0% residual stenosis.   Conclusion Cardiac cath 75% mid LAD 70% distal LAD   Pre-dilated 2.5 x 15 mm balloon trek 8 atm    Mid LAD PCI and stent DES 2.5 x 22 mm 18 atm 75->0 Distal LAD PCI and stent DES 2.5 x 12 mm 12 atm 70->0    Consults: none  Hospital Course: The patient was brought to the cardiac cath lab and underwent left heart catheterization and coronary angiography with Dr. Lujean Amel on 06/05/2022. The patient tolerated with procedure well without complications.  The patient underwent PCI with DES x2 to the mid LAD and distal LAD. On 06/06/2022 the right wrist  arterial puncture site was examined and found to be without significant erythema, tenderness to palpation, or apparent aneurysm.  Hospital course was overall uneventful, on day of discharge the patient was ambulatory and eager to go home.  The patient was given care instructions and ER return precautions and will follow up in office in 1 week, or sooner if needed.     Discharge Exam: Blood pressure 120/68, pulse 63, temperature 97.8 F (36.6 C), resp. rate 18, height $RemoveBe'5\' 7"'eqECPramu$  (1.702 m), weight 72.6 kg, SpO2 92 %.   PHYSICAL EXAM General: Pleasant Caucasian male, well nourished, in no  acute distress eating breakfast HEENT:  Normocephalic and atraumatic. Neck:   No JVD.  Lungs: Normal respiratory effort on room air.  Decreased breath sounds without appreciable crackles or wheezes Heart: HRRR . Normal S1 and S2 without gallops or murmurs.  Abdomen: non-distended appearing.  Msk: Normal strength and tone for age. Extremities: No clubbing, cyanosis, edema.  Access: Right wrist with expected ecchymosis and trace swelling with gauze and Tegaderm covering arterial puncture site without tenderness to palpation Neuro: Alert and oriented x3 Psych:  mood appropriate for situation.    Labs:   Lab Results  Component Value Date   WBC 8.7 06/06/2022   HGB 12.6 (L) 06/06/2022   HCT 39.2 06/06/2022   MCV 97.5 06/06/2022   PLT 203 06/06/2022    Recent Labs  Lab 06/06/22 0623  NA 141  K 4.0  CL 109  CO2 28  BUN 14  CREATININE 0.84  CALCIUM 8.7*  GLUCOSE 106*   EKG: Sinus rhythm with nonspecific T wave changes  FOLLOW UP PLANS AND APPOINTMENTS Discharge Instructions     AMB Referral to Cardiac Rehabilitation - Phase II   Complete by: As directed    Diagnosis: Coronary Stents   After initial evaluation and assessments completed: Virtual Based Care may be provided alone or in conjunction with Phase 2 Cardiac Rehab based on patient barriers.: Yes      Allergies as of 06/06/2022       Reactions  Aspirin Other (See Comments)   Other Reaction: steven-johnson reaction [name] Syndrome   Bee Venom Anaphylaxis   Sulfa Antibiotics Swelling   Other reaction(s): Unknown        Medication List     STOP taking these medications    clopidogrel 75 MG tablet Commonly known as: PLAVIX   finasteride 5 MG tablet Commonly known as: PROSCAR   ondansetron 4 MG disintegrating tablet Commonly known as: ZOFRAN-ODT   pravastatin 80 MG tablet Commonly known as: PRAVACHOL       TAKE these medications    albuterol (2.5 MG/3ML) 0.083% nebulizer  solution Commonly known as: PROVENTIL Take 3 mLs (2.5 mg total) by nebulization every 6 (six) hours as needed for wheezing or shortness of breath. What changed: Another medication with the same name was removed. Continue taking this medication, and follow the directions you see here.   atomoxetine 80 MG capsule Commonly known as: STRATTERA Take 80 mg by mouth daily.   atorvastatin 80 MG tablet Commonly known as: LIPITOR Take 80 mg by mouth daily.   b complex vitamins tablet Take 1 tablet by mouth daily.   calamine lotion Apply 1 application  topically as needed for itching.   CENTRUM SILVER 50+MEN PO Take 1 tablet by mouth daily.   cyclobenzaprine 5 MG tablet Commonly known as: FLEXERIL Take 5 mg by mouth at bedtime.   EPINEPHrine 0.3 mg/0.3 mL Soaj injection Commonly known as: EPI-PEN Inject 0.3 mLs (0.3 mg total) into the muscle as needed for anaphylaxis.   fluticasone 50 MCG/ACT nasal spray Commonly known as: FLONASE Place 1 spray into both nostrils in the morning and at bedtime.   furosemide 20 MG tablet Commonly known as: LASIX Take 60 mg by mouth daily as needed for edema.   hydrocortisone cream 1 % Apply 1 application topically 2 (two) times daily as needed for itching.   Ipratropium-Albuterol 20-100 MCG/ACT Aers respimat Commonly known as: COMBIVENT Inhale 2 puffs into the lungs every 6 (six) hours. What changed:  how much to take when to take this   isosorbide mononitrate 60 MG 24 hr tablet Commonly known as: IMDUR Take 60 mg by mouth daily.   levothyroxine 50 MCG tablet Commonly known as: SYNTHROID Take 50 mcg by mouth every evening.   lisinopril 5 MG tablet Commonly known as: ZESTRIL Take 5 mg by mouth daily.   loratadine 10 MG tablet Commonly known as: CLARITIN Take 10 mg by mouth daily.   meclizine 25 MG tablet Commonly known as: ANTIVERT Take 25 mg by mouth 3 (three) times daily as needed.   metoprolol succinate 50 MG 24 hr  tablet Commonly known as: TOPROL-XL Take 25 mg by mouth daily. Take with or immediately following a meal.   Nebulizer System All-In-One Misc Use as directed for COPD   nitroGLYCERIN 0.4 MG SL tablet Commonly known as: NITROSTAT Place 1 tablet (0.4 mg total) under the tongue every 5 (five) minutes as needed for chest pain.   omeprazole 40 MG capsule Commonly known as: PRILOSEC Take 40 mg by mouth daily.   Oxycodone HCl 10 MG Tabs Take 10 mg by mouth 3 (three) times daily as needed (pain).   potassium citrate 10 MEQ (1080 MG) SR tablet Commonly known as: UROCIT-K Take 20 mEq by mouth 2 (two) times daily.   pregabalin 200 MG capsule Commonly known as: LYRICA Take 200 mg by mouth 3 (three) times daily.   tamsulosin 0.4 MG Caps capsule Commonly known as: FLOMAX Take  0.8 mg by mouth daily.   ticagrelor 90 MG Tabs tablet Commonly known as: BRILINTA Take 1 tablet (90 mg total) by mouth 2 (two) times daily.   traZODone 100 MG tablet Commonly known as: DESYREL Take 300 mg by mouth at bedtime.   Turmeric 500 MG Caps Take 500 mg by mouth in the morning and at bedtime.   warfarin 5 MG tablet Commonly known as: COUMADIN Take 5 mg by mouth every evening.   zinc gluconate 50 MG tablet Take 50 mg by mouth daily.        Follow-up Information     Yolonda Kida, MD. Go in 1 week(s).   Specialties: Cardiology, Internal Medicine Contact information: [place] [address] [phone]                 BRING ALL MEDICATIONS WITH YOU TO FOLLOW UP APPOINTMENTS  This patient's plan of care was discussed and created with Dr. Clayborn Bigness and he is in agreement.    Time spent with patient : >30 mins Signed:  Alanson Puls Mazen Marcin PA-C 06/06/2022, 10:24 AM

## 2022-06-06 NOTE — Progress Notes (Signed)
  Transition of Care [facility]) Screening Note   Patient Details  Name: Brett Fernandez Date of Birth: 03-Jul-1961   Transition of Care [facility]) CM/SW Contact:    Alberteen Sam, LCSW Phone Number: 06/06/2022, 10:03 AM    Transition of Care Department [facility]) has reviewed patient and no TOC needs have been identified at this time. We will continue to monitor patient advancement through interdisciplinary progression rounds. If new patient transition needs arise, please place a TOC consult.  [place], [place]

## 2022-06-06 NOTE — Progress Notes (Signed)
Brief cardiology progress note  Status post PCI and stent to LAD with DES No complications Patient on Brilinta in place of Plavix hopefully for 4 weeks then will resume Plavix Will restart Coumadin tomorrow for DVT prophylaxis Not on aspirin because the patient describes an allergy Medication reconciliation complete Patient should be stable for discharge home today Patient should follow-up with cardiology 1 to 2 weeks Patient seen and examined by Erwin Have the patient follow-up with me in 1 to 2 weeks

## 2022-06-07 LAB — LIPOPROTEIN A (LPA): Lipoprotein (a): 53.4 nmol/L — ABNORMAL HIGH (ref <75.0)

## 2022-06-08 ENCOUNTER — Encounter (INDEPENDENT_AMBULATORY_CARE_PROVIDER_SITE_OTHER): Payer: Self-pay | Admitting: Nurse Practitioner

## 2022-06-08 NOTE — Progress Notes (Signed)
Subjective:    Patient ID: Brett Fernandez, male    DOB: 03-Nov-1960, 61 y.o.   MRN: [id number] Chief Complaint  Patient presents with   Establish Care    Referred by Dr Quillian Quince    Today the patient presents as a referral from Dr. Anne Hahn with concern for peripheral vascular disease.  The patient recently saw his cardiologist and there was concern due to painful lower extremities with the left being worse than the right with weak pulses.  They describe some pain with elevation as well as varicosities.  There is also concern for carotid stenosis.  The symptoms began several months ago.  Currently the patient is on Plavix and Coumadin.  He notes that the pain started after his stress test.  He has pain in his groin areas.  Additionally has left knee pain.  He endorses some lightheadedness with activity and has had a previous CVA.  Today noninvasive studies show an ABI of 1.14 on the right and 1.14 on the left.  He has strong triphasic tibial artery waveforms bilaterally with normal toe waveforms bilaterally.    Review of Systems  Neurological:  Positive for light-headedness.  All other systems reviewed and are negative.      Objective:   Physical Exam Vitals reviewed.  HENT:     Head: Normocephalic.  Neck:     Vascular: Carotid bruit present.  Cardiovascular:     Rate and Rhythm: Normal rate.     Pulses:          Dorsalis pedis pulses are 1+ on the right side and 1+ on the left side.       Posterior tibial pulses are 1+ on the right side and 1+ on the left side.  Pulmonary:     Effort: Pulmonary effort is normal.  Skin:    General: Skin is warm and dry.     Comments: Varicosities noted bilaterally  Neurological:     Mental Status: He is alert and oriented to person, place, and time.  Psychiatric:        Mood and Affect: Mood normal.        Behavior: Behavior normal.        Thought Content: Thought content normal.        Judgment: Judgment normal.     BP 124/80 (BP  Location: Left Arm)   Pulse 77   Resp 19   Ht 5\' 6"  (1.676 m)   Wt 181 lb 6.4 oz (82.3 kg)   BMI 29.28 kg/m   Past Medical History:  Diagnosis Date   Acute respiratory failure with hypoxia (HCC) 03/21/2020   after multiple bee stings   Allergy    Anginal pain (HCC)    Bipolar disorder (HCC)    BPH (benign prostatic hypertrophy)    CHF (congestive heart failure) (HCC)    COPD (chronic obstructive pulmonary disease) (HCC)    COPD exacerbation (HCC) 11/02/2015   Coronary artery disease    Deep vein thrombosis (DVT) (HCC)    Depression    DJD (degenerative joint disease)    Gastric ulcer    GERD (gastroesophageal reflux disease)    History of hiatal hernia    Hypertension    Hypothyroidism    Kidney stones    Lethargy 11/02/2015   Neuromuscular disorder (HCC)    PTSD (post-traumatic stress disorder)     Social History   Socioeconomic History   Marital status: Divorced    Spouse name: Not on file  Number of children: Not on file   Years of education: Not on file   Highest education level: Not on file  Occupational History   Not on file  Tobacco Use   Smoking status: Every Day    Packs/day: 2.25    Years: 55.00    Total pack years: 123.75    Types: Cigarettes   Smokeless tobacco: Never   Tobacco comments:    cutting down, not ready to quit  Vaping Use   Vaping Use: Never used  Substance and Sexual Activity   Alcohol use: Yes   Drug use: No   Sexual activity: Not Currently  Other Topics Concern   Not on file  Social History Narrative   Not on file   Social Determinants of Health   Financial Resource Strain: Not on file  Food Insecurity: Not on file  Transportation Needs: Not on file  Physical Activity: Not on file  Stress: Not on file  Social Connections: Not on file  Intimate Partner Violence: Not on file    Past Surgical History:  Procedure Laterality Date   APPENDECTOMY     colon polyps     CORONARY STENT INTERVENTION N/A 03/21/2021    Procedure: CORONARY STENT INTERVENTION;  Surgeon: Alwyn Pea, MD;  Location: [facility];  Service: Cardiovascular;  Laterality: N/A;   CORONARY STENT INTERVENTION Left 06/05/2022   Procedure: CORONARY STENT INTERVENTION;  Surgeon: Alwyn Pea, MD;  Location: [facility];  Service: Cardiovascular;  Laterality: Left;   HEMORRHOIDECTOMY WITH HEMORRHOID BANDING     HERNIA REPAIR     INNER EAR SURGERY     JOINT REPLACEMENT Left    LEFT HEART CATH AND CORONARY ANGIOGRAPHY N/A 03/21/2021   Procedure: LEFT HEART CATH AND CORONARY ANGIOGRAPHY;  Surgeon: Alwyn Pea, MD;  Location: [facility];  Service: Cardiovascular;  Laterality: N/A;   RIGHT/LEFT HEART CATH AND CORONARY ANGIOGRAPHY Bilateral 02/25/2022   Procedure: RIGHT/LEFT HEART CATH AND CORONARY ANGIOGRAPHY;  Surgeon: Alwyn Pea, MD;  Location: [facility];  Service: Cardiovascular;  Laterality: Bilateral;   TOTAL KNEE ARTHROPLASTY Right 11/01/2015   Procedure: TOTAL KNEE ARTHROPLASTY;  Surgeon: Juanell Fairly, MD;  Location: [facility];  Service: Orthopedics;  Laterality: Right;    Family History  Problem Relation Age of Onset   Depression Mother    Diabetes Mother    Heart disease Mother    COPD Mother    Heart disease Father    Cancer Father     Allergies  Allergen Reactions   Aspirin Other (See Comments)    Other Reaction: steven-johnson reaction [name]   Bee Venom Anaphylaxis   Sulfa Antibiotics Swelling    Other reaction(s): Unknown       Latest Ref Rng & Units 06/06/2022    6:23 AM 01/30/2022    3:30 PM 03/22/2021    4:54 AM  CBC  WBC 4.0 - 10.5 K/uL 8.7  10.5  9.3   Hemoglobin 13.0 - 17.0 g/dL 23.5  36.1  44.3   Hematocrit 39.0 - 52.0 % 39.2  43.3  42.2   Platelets 150 - 400 K/uL 203  180  176       CMP     Component Value Date/Time   NA 141 06/06/2022 0623   NA 141 10/13/2014 0518   K 4.0 06/06/2022 0623   K 4.1 10/13/2014 0518    CL 109 06/06/2022 0623   CL 109 (H)  10/13/2014 0518   CO2 28 06/06/2022 0623   CO2 27 10/13/2014 0518   GLUCOSE 106 (H) 06/06/2022 0623   GLUCOSE 125 (H) 10/13/2014 0518   BUN 14 06/06/2022 0623   BUN 8 10/13/2014 0518   CREATININE 0.84 06/06/2022 0623   CREATININE 1.04 10/13/2014 0518   CALCIUM 8.7 (L) 06/06/2022 0623   CALCIUM 8.3 (L) 10/13/2014 0518   PROT 6.8 01/22/2021 1435   ALBUMIN 3.9 01/22/2021 1435   AST 26 01/22/2021 1435   ALT 27 01/22/2021 1435   ALKPHOS 83 01/22/2021 1435   BILITOT 0.7 01/22/2021 1435   GFRNONAA >60 06/06/2022 0623   GFRNONAA >60 10/13/2014 0518   GFRNONAA >60 02/20/2012 1257   GFRAA >60 03/22/2020 0520   GFRAA >60 10/13/2014 0518   GFRAA >60 02/20/2012 1257     VAS [place] ABI WITH/WO TBI  Result Date: 05/22/2022  LOWER EXTREMITY DOPPLER STUDY Patient Name:  RYLAN BERNARD  Date of Exam:   05/15/2022 Medical Rec #: [id number]         Accession #:    [id number] Date of Birth: 01-19-1961         Patient Gender: M Patient Age:   33 years Exam Location:  [facility] Procedure:      VAS [place] ABI WITH/WO TBI Referring Phys: --------------------------------------------------------------------------------  Indications: rt leg pain  Performing Technologist: Concha Norway RVT  Examination Guidelines: A complete evaluation includes at minimum, Doppler waveform signals and systolic blood pressure reading at the level of bilateral brachial, anterior tibial, and posterior tibial arteries, when vessel segments are accessible. Bilateral testing is considered an integral part of a complete examination. Photoelectric Plethysmograph (PPG) waveforms and toe systolic pressure readings are included as required and additional duplex testing as needed. Limited examinations for reoccurring indications may be performed as noted.  ABI Findings: +---------+------------------+-----+---------+--------+ Right    Rt Pressure (mmHg)IndexWaveform Comment   +---------+------------------+-----+---------+--------+ Brachial 132                                      +---------+------------------+-----+---------+--------+ ATA      141               1.07 triphasic         +---------+------------------+-----+---------+--------+ PTA      150               1.14 triphasic         +---------+------------------+-----+---------+--------+ Great Toe106               0.80 Normal            +---------+------------------+-----+---------+--------+ +---------+------------------+-----+---------+-------+ Left     Lt Pressure (mmHg)IndexWaveform Comment +---------+------------------+-----+---------+-------+ Brachial 130                                     +---------+------------------+-----+---------+-------+ ATA      136               1.03 triphasic        +---------+------------------+-----+---------+-------+ PTA      150               1.14 triphasic        +---------+------------------+-----+---------+-------+ Great Toe119               0.90 Normal           +---------+------------------+-----+---------+-------+  Summary: Right: Resting right ankle-brachial index indicates noncompressible right lower extremity arteries. The right toe-brachial index is normal. Left: Resting left ankle-brachial index is within normal range. The left toe-brachial index is normal. *See table(s) above for measurements and observations.  Electronically signed by Levora Dredge MD on 05/22/2022 at 4:26:02 PM.    Final        Assessment & Plan:   1. Left leg pain Today's noninvasive study showed no evidence of peripheral arterial disease.  However the patient's pain discomfort could be related to some venous disease.  We will have him return for noninvasive studies to determine if there is any evidence of venous reflux that may account for his pain.  If venous abnormalities further work-up will be required by primary care provider. - VAS [place] ABI WITH/WO  TBI   2. Episodic lightheadedness He has a notable bruit and in some instances dizziness/lightheadedness can be caused by carotid stenosis.  We will evaluate at his follow-up visit.  3. Chronic radicular pain of lower extremity The patient's pain is likely multifactorial in nature.  I suspect that this may also play a role in the patient's pain   Current Outpatient Medications on File Prior to Visit  Medication Sig Dispense Refill   albuterol (PROVENTIL) (2.5 MG/3ML) 0.083% nebulizer solution Take 3 mLs (2.5 mg total) by nebulization every 6 (six) hours as needed for wheezing or shortness of breath. 75 mL 12   atomoxetine (STRATTERA) 80 MG capsule Take 80 mg by mouth daily.     b complex vitamins tablet Take 1 tablet by mouth daily.     calamine lotion Apply 1 application  topically as needed for itching.     cyclobenzaprine (FLEXERIL) 5 MG tablet Take 5 mg by mouth at bedtime.     EPINEPHrine 0.3 mg/0.3 mL IJ SOAJ injection Inject 0.3 mLs (0.3 mg total) into the muscle as needed for anaphylaxis. 1 each 0   fluticasone (FLONASE) 50 MCG/ACT nasal spray Place 1 spray into both nostrils in the morning and at bedtime.      furosemide (LASIX) 20 MG tablet Take 60 mg by mouth daily as needed for edema.     hydrocortisone cream 1 % Apply 1 application topically 2 (two) times daily as needed for itching.     Ipratropium-Albuterol (COMBIVENT) 20-100 MCG/ACT AERS respimat Inhale 2 puffs into the lungs every 6 (six) hours. (Patient taking differently: Inhale 1 puff into the lungs in the morning, at noon, in the evening, and at bedtime.) 4 g 0   isosorbide mononitrate (IMDUR) 60 MG 24 hr tablet Take 60 mg by mouth daily.     levothyroxine (SYNTHROID, LEVOTHROID) 50 MCG tablet Take 50 mcg by mouth every evening.      lisinopril (ZESTRIL) 5 MG tablet Take 5 mg by mouth daily.     loratadine (CLARITIN) 10 MG tablet Take 10 mg by mouth daily.      meclizine (ANTIVERT) 25 MG tablet Take 25 mg by mouth 3  (three) times daily as needed.     metoprolol succinate (TOPROL-XL) 50 MG 24 hr tablet Take 25 mg by mouth daily. Take with or immediately following a meal.     Multiple Vitamins-Minerals (CENTRUM SILVER 50+MEN PO) Take 1 tablet by mouth daily.     Nebulizer System All-In-One MISC Use as directed for COPD 1 each 0   nitroGLYCERIN (NITROSTAT) 0.4 MG SL tablet Place 1 tablet (0.4 mg total) under the tongue every 5 (five) minutes as needed  for chest pain. 30 tablet 2   omeprazole (PRILOSEC) 40 MG capsule Take 40 mg by mouth daily.      Oxycodone HCl 10 MG TABS Take 10 mg by mouth 3 (three) times daily as needed (pain).     potassium citrate (UROCIT-K) 10 MEQ (1080 MG) SR tablet Take 20 mEq by mouth 2 (two) times daily.      pregabalin (LYRICA) 200 MG capsule Take 200 mg by mouth 3 (three) times daily.     tamsulosin (FLOMAX) 0.4 MG CAPS capsule Take 0.8 mg by mouth daily.     ticagrelor (BRILINTA) 90 MG TABS tablet Take 1 tablet (90 mg total) by mouth 2 (two) times daily. (Patient not taking: Reported on 06/05/2022) 60 tablet 6   traZODone (DESYREL) 100 MG tablet Take 300 mg by mouth at bedtime.     Turmeric 500 MG CAPS Take 500 mg by mouth in the morning and at bedtime.     warfarin (COUMADIN) 5 MG tablet Take 5 mg by mouth every evening.     zinc gluconate 50 MG tablet Take 50 mg by mouth daily.     atorvastatin (LIPITOR) 80 MG tablet Take 80 mg by mouth daily.     No current facility-administered medications on file prior to visit.    There are no Patient Instructions on file for this visit. No follow-ups on file.   Georgiana Spinner, NP

## 2022-06-20 ENCOUNTER — Other Ambulatory Visit (INDEPENDENT_AMBULATORY_CARE_PROVIDER_SITE_OTHER): Payer: Self-pay | Admitting: Nurse Practitioner

## 2022-06-20 DIAGNOSIS — R42 Dizziness and giddiness: Secondary | ICD-10-CM

## 2022-06-20 DIAGNOSIS — R0989 Other specified symptoms and signs involving the circulatory and respiratory systems: Secondary | ICD-10-CM

## 2022-06-20 DIAGNOSIS — M79605 Pain in left leg: Secondary | ICD-10-CM

## 2022-06-23 ENCOUNTER — Ambulatory Visit (INDEPENDENT_AMBULATORY_CARE_PROVIDER_SITE_OTHER): Payer: Medicaid Other | Admitting: Nurse Practitioner

## 2022-06-23 ENCOUNTER — Ambulatory Visit (INDEPENDENT_AMBULATORY_CARE_PROVIDER_SITE_OTHER): Payer: Medicaid Other

## 2022-06-23 ENCOUNTER — Encounter (INDEPENDENT_AMBULATORY_CARE_PROVIDER_SITE_OTHER): Payer: Self-pay | Admitting: Nurse Practitioner

## 2022-06-23 VITALS — BP 148/93 | HR 57 | Wt 173.4 lb

## 2022-06-23 DIAGNOSIS — M79605 Pain in left leg: Secondary | ICD-10-CM | POA: Diagnosis not present

## 2022-06-23 DIAGNOSIS — M79604 Pain in right leg: Secondary | ICD-10-CM | POA: Diagnosis not present

## 2022-06-23 DIAGNOSIS — R0989 Other specified symptoms and signs involving the circulatory and respiratory systems: Secondary | ICD-10-CM | POA: Diagnosis not present

## 2022-06-23 DIAGNOSIS — G8929 Other chronic pain: Secondary | ICD-10-CM | POA: Diagnosis not present

## 2022-06-23 DIAGNOSIS — I6523 Occlusion and stenosis of bilateral carotid arteries: Secondary | ICD-10-CM

## 2022-06-23 DIAGNOSIS — M5410 Radiculopathy, site unspecified: Secondary | ICD-10-CM | POA: Diagnosis not present

## 2022-06-23 DIAGNOSIS — R42 Dizziness and giddiness: Secondary | ICD-10-CM | POA: Diagnosis not present

## 2022-06-24 ENCOUNTER — Encounter (INDEPENDENT_AMBULATORY_CARE_PROVIDER_SITE_OTHER): Payer: Self-pay | Admitting: Nurse Practitioner

## 2022-06-24 NOTE — Progress Notes (Signed)
Subjective:    Patient ID: Brett Fernandez, male    DOB: 15-Feb-1961, 61 y.o.   MRN: [id number] Chief Complaint  Patient presents with   Follow-up    Ultrasound follow up    Today the patient presents as a referral from Dr. Jannifer Franklin with concern for peripheral vascular disease.  The patient recently saw his cardiologist and there was concern due to painful lower extremities with the left being worse than the right with weak pulses.  They describe some pain with elevation as well as varicosities.  There is also concern for carotid stenosis.  The symptoms began several months ago.  Currently the patient is on Plavix and Coumadin.  He notes that the pain started after his stress test.  He has pain in his groin areas.  Additionally has left knee pain.  He endorses some lightheadedness with activity and has had a previous CVA.   Previous noninvasive studies show an ABI of 1.14 on the right and 1.14 on the left.  He has strong triphasic tibial artery waveforms bilaterally with normal toe waveforms bilaterally  No DVT or superficial thrombophlebitis seen bilaterally.  No evidence of deep venous insufficiency seen bilaterally.  Small amount of venous reflux in the left great saphenous vein at the saphenofemoral junction.  No reflux in the right great saphenous vein.  Today the patient has near normal extracranial vessels with only minimal wall thickening bilaterally.  The left vertebral artery has antegrade flow however the vertebral on the right does not visualize.  This is suggestive of stenosis.  However there are normal flow hemodynamics in the bilateral subclavian arteries and no evidence of steal syndrome.    Review of Systems  Musculoskeletal:  Positive for arthralgias.  Neurological:  Positive for light-headedness.  All other systems reviewed and are negative.      Objective:   Physical Exam Vitals reviewed.  HENT:     Head: Normocephalic.  Neck:     Vascular: No carotid bruit.   Cardiovascular:     Rate and Rhythm: Normal rate.     Pulses:          Dorsalis pedis pulses are detected w/ Doppler on the right side and detected w/ Doppler on the left side.       Posterior tibial pulses are detected w/ Doppler on the right side and detected w/ Doppler on the left side.  Pulmonary:     Effort: Pulmonary effort is normal.  Musculoskeletal:     Right lower leg: No edema.     Left lower leg: No edema.  Skin:    General: Skin is warm and dry.  Neurological:     Mental Status: He is alert and oriented to person, place, and time.  Psychiatric:        Mood and Affect: Mood normal.        Behavior: Behavior normal.        Thought Content: Thought content normal.        Judgment: Judgment normal.     BP (!) 148/93 (BP Location: Left Arm)   Pulse (!) 57   Wt 173 lb 6.4 oz (78.7 kg)   BMI 27.16 kg/m   Past Medical History:  Diagnosis Date   Acute respiratory failure with hypoxia ([place]) 03/21/2020   after multiple bee stings   Allergy    Anginal pain (HCC)    Bipolar disorder (HCC)    BPH (benign prostatic hypertrophy)    CHF (congestive heart failure) (  HCC)    COPD (chronic obstructive pulmonary disease) (HCC)    COPD exacerbation ([place]) 11/02/2015   Coronary artery disease    Deep vein thrombosis (DVT) (HCC)    Depression    DJD (degenerative joint disease)    Gastric ulcer    GERD (gastroesophageal reflux disease)    History of hiatal hernia    Hypertension    Hypothyroidism    Kidney stones    Lethargy 11/02/2015   Neuromuscular disorder (HCC)    PTSD (post-traumatic stress disorder)     Social History   Socioeconomic History   Marital status: Divorced    Spouse name: Not on file   Number of children: Not on file   Years of education: Not on file   Highest education level: Not on file  Occupational History   Not on file  Tobacco Use   Smoking status: Every Day    Packs/day: 2.25    Years: 55.00    Total pack years: 123.75    Types:  Cigarettes   Smokeless tobacco: Never   Tobacco comments:    cutting down, not ready to quit  Vaping Use   Vaping Use: Never used  Substance and Sexual Activity   Alcohol use: Yes   Drug use: No   Sexual activity: Not Currently  Other Topics Concern   Not on file  Social History Narrative   Not on file   Social Determinants of Health   Financial Resource Strain: Not on file  Food Insecurity: Not on file  Transportation Needs: Not on file  Physical Activity: Not on file  Stress: Not on file  Social Connections: Not on file  Intimate Partner Violence: Not on file    Past Surgical History:  Procedure Laterality Date   APPENDECTOMY     colon polyps     CORONARY STENT INTERVENTION N/A 03/21/2021   Procedure: CORONARY STENT INTERVENTION;  Surgeon: Yolonda Kida, MD;  Location: [facility];  Service: Cardiovascular;  Laterality: N/A;   CORONARY STENT INTERVENTION Left 06/05/2022   Procedure: CORONARY STENT INTERVENTION;  Surgeon: Yolonda Kida, MD;  Location: [facility];  Service: Cardiovascular;  Laterality: Left;   HEMORRHOIDECTOMY WITH HEMORRHOID BANDING     HERNIA REPAIR     INNER EAR SURGERY     JOINT REPLACEMENT Left    LEFT HEART CATH AND CORONARY ANGIOGRAPHY N/A 03/21/2021   Procedure: LEFT HEART CATH AND CORONARY ANGIOGRAPHY;  Surgeon: Yolonda Kida, MD;  Location: [facility];  Service: Cardiovascular;  Laterality: N/A;   RIGHT/LEFT HEART CATH AND CORONARY ANGIOGRAPHY Bilateral 02/25/2022   Procedure: RIGHT/LEFT HEART CATH AND CORONARY ANGIOGRAPHY;  Surgeon: Yolonda Kida, MD;  Location: [facility];  Service: Cardiovascular;  Laterality: Bilateral;   TOTAL KNEE ARTHROPLASTY Right 11/01/2015   Procedure: TOTAL KNEE ARTHROPLASTY;  Surgeon: Thornton Park, MD;  Location: [facility];  Service: Orthopedics;  Laterality: Right;    Family History  Problem Relation Age of Onset   Depression Mother    Diabetes Mother     Heart disease Mother    COPD Mother    Heart disease Father    Cancer Father     Allergies  Allergen Reactions   Aspirin Other (See Comments)    Other Reaction: steven-johnson reaction [name] Syndrome   Bee Venom Anaphylaxis   Sulfa Antibiotics Swelling    Other reaction(s): Unknown       Latest Ref Rng & Units 06/06/2022  6:23 AM 01/30/2022    3:30 PM 03/22/2021    4:54 AM  CBC  WBC 4.0 - 10.5 K/uL 8.7  10.5  9.3   Hemoglobin 13.0 - 17.0 g/dL 12.6  13.9  13.7   Hematocrit 39.0 - 52.0 % 39.2  43.3  42.2   Platelets 150 - 400 K/uL 203  180  176       CMP     Component Value Date/Time   NA 141 06/06/2022 0623   NA 141 10/13/2014 0518   K 4.0 06/06/2022 0623   K 4.1 10/13/2014 0518   CL 109 06/06/2022 0623   CL 109 (H) 10/13/2014 0518   CO2 28 06/06/2022 0623   CO2 27 10/13/2014 0518   GLUCOSE 106 (H) 06/06/2022 0623   GLUCOSE 125 (H) 10/13/2014 0518   BUN 14 06/06/2022 0623   BUN 8 10/13/2014 0518   CREATININE 0.84 06/06/2022 0623   CREATININE 1.04 10/13/2014 0518   CALCIUM 8.7 (L) 06/06/2022 0623   CALCIUM 8.3 (L) 10/13/2014 0518   PROT 6.8 01/22/2021 1435   ALBUMIN 3.9 01/22/2021 1435   AST 26 01/22/2021 1435   ALT 27 01/22/2021 1435   ALKPHOS 83 01/22/2021 1435   BILITOT 0.7 01/22/2021 1435   GFRNONAA >60 06/06/2022 0623   GFRNONAA >60 10/13/2014 0518   GFRNONAA >60 02/20/2012 1257   GFRAA >60 03/22/2020 0520   GFRAA >60 10/13/2014 0518   GFRAA >60 02/20/2012 1257     VAS [place] ABI WITH/WO TBI  Result Date: 05/22/2022  LOWER EXTREMITY DOPPLER STUDY Patient Name:  MONDRELL ANGLADE  Date of Exam:   05/15/2022 Medical Rec #: [id number]         Accession #:    [id number] Date of Birth: 10/30/1960         Patient Gender: M Patient Age:   55 years Exam Location:  [facility] Procedure:      VAS [place] ABI WITH/WO TBI Referring Phys: --------------------------------------------------------------------------------  Indications: rt leg pain   Performing Technologist: Concha Norway RVT  Examination Guidelines: A complete evaluation includes at minimum, Doppler waveform signals and systolic blood pressure reading at the level of bilateral brachial, anterior tibial, and posterior tibial arteries, when vessel segments are accessible. Bilateral testing is considered an integral part of a complete examination. Photoelectric Plethysmograph (PPG) waveforms and toe systolic pressure readings are included as required and additional duplex testing as needed. Limited examinations for reoccurring indications may be performed as noted.  ABI Findings: +---------+------------------+-----+---------+--------+ Right    Rt Pressure (mmHg)IndexWaveform Comment  +---------+------------------+-----+---------+--------+ Brachial 132                                      +---------+------------------+-----+---------+--------+ ATA      141               1.07 triphasic         +---------+------------------+-----+---------+--------+ PTA      150               1.14 triphasic         +---------+------------------+-----+---------+--------+ Great Toe106               0.80 Normal            +---------+------------------+-----+---------+--------+ +---------+------------------+-----+---------+-------+ Left     Lt Pressure (mmHg)IndexWaveform Comment +---------+------------------+-----+---------+-------+ Brachial 130                                     +---------+------------------+-----+---------+-------+  ATA      136               1.03 triphasic        +---------+------------------+-----+---------+-------+ PTA      150               1.14 triphasic        +---------+------------------+-----+---------+-------+ Great Toe119               0.90 Normal           +---------+------------------+-----+---------+-------+  Summary: Right: Resting right ankle-brachial index indicates noncompressible right lower extremity arteries. The right  toe-brachial index is normal. Left: Resting left ankle-brachial index is within normal range. The left toe-brachial index is normal. *See table(s) above for measurements and observations.  Electronically signed by Hortencia Pilar MD on 05/22/2022 at 4:26:02 PM.    Final        Assessment & Plan:   1. Left leg pain Today the patient's noninvasive studies show that he has some reflux in the left great saphenous vein.  This is not extensive but as the patient does have some notable varicosities in his lower extremity this may be the cause of some of the tenderness.  We discussed treatment for varicose veins including conservative therapy, which entails use of medical grade compression stockings, elevation and activity.  They are also more invasive treatment such as endovenous laser ablation.  Following discussion the patient will continue with conservative therapy.  He has some shoulder issues which require surgery soon which are currently more of a concern for him.  He will rectify these issues and if he continues to have issues with treatment of the varicose veins he can contact our office for follow-up.  2. Chronic radicular pain of lower extremity May be a cause of the lower extremity discomfort.  3. Bilateral carotid artery stenosis Patient is likely occlusion of the right vertebral artery.  Currently there is no signs symptoms of steal.  I discussed with patient that once the vertebral artery is occluded there is no further intervention indicated.  Currently patient is on appropriate medication with antiplatelet and statin.  No medication changes recommended at this time.  We will follow this by annually.   Current Outpatient Medications on File Prior to Visit  Medication Sig Dispense Refill   albuterol (PROVENTIL) (2.5 MG/3ML) 0.083% nebulizer solution Take 3 mLs (2.5 mg total) by nebulization every 6 (six) hours as needed for wheezing or shortness of breath. 75 mL 12   atomoxetine (STRATTERA)  80 MG capsule Take 80 mg by mouth daily.     atorvastatin (LIPITOR) 80 MG tablet Take 80 mg by mouth daily.     b complex vitamins tablet Take 1 tablet by mouth daily.     calamine lotion Apply 1 application  topically as needed for itching.     cyclobenzaprine (FLEXERIL) 5 MG tablet Take 5 mg by mouth at bedtime.     EPINEPHrine 0.3 mg/0.3 mL IJ SOAJ injection Inject 0.3 mLs (0.3 mg total) into the muscle as needed for anaphylaxis. 1 each 0   fluticasone (FLONASE) 50 MCG/ACT nasal spray Place 1 spray into both nostrils in the morning and at bedtime.      furosemide (LASIX) 20 MG tablet Take 60 mg by mouth daily as needed for edema.     hydrocortisone cream 1 % Apply 1 application topically 2 (two) times daily as needed for itching.     Ipratropium-Albuterol (COMBIVENT) 20-100 MCG/ACT AERS  respimat Inhale 2 puffs into the lungs every 6 (six) hours. (Patient taking differently: Inhale 1 puff into the lungs in the morning, at noon, in the evening, and at bedtime.) 4 g 0   isosorbide mononitrate (IMDUR) 60 MG 24 hr tablet Take 60 mg by mouth daily.     levothyroxine (SYNTHROID, LEVOTHROID) 50 MCG tablet Take 50 mcg by mouth every evening.      lisinopril (ZESTRIL) 5 MG tablet Take 5 mg by mouth daily.     loratadine (CLARITIN) 10 MG tablet Take 10 mg by mouth daily.      meclizine (ANTIVERT) 25 MG tablet Take 25 mg by mouth 3 (three) times daily as needed.     metoprolol succinate (TOPROL-XL) 50 MG 24 hr tablet Take 25 mg by mouth daily. Take with or immediately following a meal.     Multiple Vitamins-Minerals (CENTRUM SILVER 50+MEN PO) Take 1 tablet by mouth daily.     Nebulizer System All-In-One MISC Use as directed for COPD 1 each 0   nicotine (NICODERM CQ - DOSED IN MG/24 HR) 7 mg/24hr patch Place 1 patch (7 mg total) onto the skin daily. 28 patch 0   nitroGLYCERIN (NITROSTAT) 0.4 MG SL tablet Place 1 tablet (0.4 mg total) under the tongue every 5 (five) minutes as needed for chest pain. 30  tablet 2   omeprazole (PRILOSEC) 40 MG capsule Take 40 mg by mouth daily.      Oxycodone HCl 10 MG TABS Take 10 mg by mouth 3 (three) times daily as needed (pain).     potassium citrate (UROCIT-K) 10 MEQ (1080 MG) SR tablet Take 20 mEq by mouth 2 (two) times daily.      pregabalin (LYRICA) 200 MG capsule Take 200 mg by mouth 3 (three) times daily.     tamsulosin (FLOMAX) 0.4 MG CAPS capsule Take 0.8 mg by mouth daily.     traZODone (DESYREL) 100 MG tablet Take 300 mg by mouth at bedtime.     Turmeric 500 MG CAPS Take 500 mg by mouth in the morning and at bedtime.     warfarin (COUMADIN) 5 MG tablet Take 5 mg by mouth every evening.     zinc gluconate 50 MG tablet Take 50 mg by mouth daily.     ticagrelor (BRILINTA) 90 MG TABS tablet Take 1 tablet (90 mg total) by mouth 2 (two) times daily. (Patient not taking: Reported on 06/05/2022) 60 tablet 6   No current facility-administered medications on file prior to visit.    There are no Patient Instructions on file for this visit. No follow-ups on file.   Kris Hartmann, NP

## 2022-07-14 ENCOUNTER — Encounter (INDEPENDENT_AMBULATORY_CARE_PROVIDER_SITE_OTHER): Payer: Self-pay

## 2022-08-19 ENCOUNTER — Other Ambulatory Visit: Payer: Self-pay | Admitting: Orthopedic Surgery

## 2022-09-09 ENCOUNTER — Other Ambulatory Visit: Payer: Self-pay

## 2022-09-09 ENCOUNTER — Encounter
Admission: RE | Admit: 2022-09-09 | Discharge: 2022-09-09 | Disposition: A | Discharge status: Home / Self Care | Payer: Medicaid Other | Source: Ambulatory Visit | Attending: Orthopedic Surgery | Admitting: Orthopedic Surgery

## 2022-09-09 VITALS — BP 141/90 | HR 79 | Resp 16 | Ht 66.0 in | Wt 169.0 lb

## 2022-09-09 DIAGNOSIS — Z7901 Long term (current) use of anticoagulants: Secondary | ICD-10-CM

## 2022-09-09 HISTORY — DX: Acute myocardial infarction, unspecified: I21.9

## 2022-09-09 NOTE — Patient Instructions (Signed)
Your procedure is scheduled on: 09/18/22 Report to [place]. To find out your arrival time please call [phone] between 1PM - 3PM on 09/17/22.  Remember: Instructions that are not followed completely may result in serious medical risk, up to and including death, or upon the discretion of your surgeon and anesthesiologist your surgery may need to be rescheduled.     _X__ 1. Do not eat food after midnight the night before your procedure.                 No gum chewing or hard candies. You may drink clear liquids up to 2 hours                 before you are scheduled to arrive for your surgery- DO not drink clear                 liquids within 2 hours of the start of your surgery.                 Clear Liquids include:  water, apple juice without pulp, clear carbohydrate                 drink such as Clearfast or Gatorade, Black Coffee or Tea (Do not add                 anything to coffee or tea). Diabetics water only  Drink the Ensure 2 hours before arriving to surgery.  __X__2.  On the morning of surgery brush your teeth with toothpaste and water, you                 may rinse your mouth with mouthwash if you wish.  Do not swallow any              toothpaste of mouthwash.     _X__ 3.  No Alcohol for 24 hours before or after surgery.   _X__ 4.  Do Not Smoke or use e-cigarettes For 24 Hours Prior to Your Surgery.                 Do not use any chewable tobacco products for at least 6 hours prior to                 surgery.  ____  5.  Bring all medications with you on the day of surgery if instructed.   __X__  6.  Notify your doctor if there is any change in your medical condition      (cold, fever, infections).     Do not wear jewelry, make-up, hairpins, clips or nail polish. Do not wear lotions, powders, or perfumes.  Do not shave body hair 48 hours prior to surgery. Men may shave face and neck. Do not bring valuables to the  hospital.    [facility] is not responsible for any belongings or valuables.  Contacts, dentures/partials or body piercings may not be worn into surgery. Bring a case for your contacts, glasses or hearing aids, a denture cup will be supplied. Leave your suitcase in the car. After surgery it may be brought to your room. For patients admitted to the hospital, discharge time is determined by your treatment team.   Patients discharged the day of surgery will not be allowed to drive home.   Please read over the following fact sheets that you were given:   MRSA Information, CHG soap  __X__  Take these medicines the morning of surgery with A SIP OF WATER:    1. atorvastatin (LIPITOR) 80 MG tablet   2. isosorbide mononitrate (IMDUR) 60 MG 24 hr table   3. levothyroxine (SYNTHROID, LEVOTHROID) 50 MCG tablet   4. loratadine (CLARITIN) 10 MG tablet   5. metoprolol succinate (TOPROL-XL) 50 MG 24 hr tablet   6. omeprazole (PRILOSEC) 40 MG capsule   7. pregabalin (LYRICA) 200 MG capsule    8. tamsulosin (FLOMAX) 0.4 MG CAPS capsule   9. May take oxycodone if needed  ____ Fleet  Enema (as directed)   __X__ Use CHG Soap/SAGE wipes as directed  __X__ Use inhalers on the day of surgery  USE YOUR NEBULIZER TREATMENT  ____ Stop metformin/Janumet/Farxiga 2 days prior to surgery    ____ Take 1/2 of usual insulin dose the night before surgery. No insulin the morning          of surgery.   ____ Stop Blood Thinners Coumadin/Plavix/Xarelto/Pleta/Pradaxa/Eliquis/Effient/Aspirin  on   Or contact your Surgeon, Cardiologist or Medical Doctor regarding  ability to stop your blood thinners  __X__ Stop Anti-inflammatories 7 days before surgery such as Advil, Ibuprofen, Motrin,  BC or Goodies Powder, Naprosyn, Naproxen, Aleve, Aspirin    __X__ Stop all herbals and supplements, fish oil or vitamins  until after surgery.    ____ Bring C-Pap to the hospital.   HOLD YOUR BLOOD THINNERS AS INSTRUCTED BY DR  CALWOOD.  APPOINTMENT 09/10/22 AT 3:15 WITH DR Nydia Bouton

## 2022-09-11 NOTE — Progress Notes (Signed)
Seen by Dr. Juliann Pares (cardiology) on 09/10/22 for pre-op clearance.  Copy and pasted note from that visit:   Plan   Preoperative clearance for upcoming shoulder surgery, patient is at moderate risk and is cleared from a cardiac standpoint for surgery. Hold Brilinta and Warfarin 5 days before procedure, re-start 2 days after procedure h/o NSTEMI CAD with unstable angina, S/p PCI/DES to LAD (03/2021), S/p PCI/DES x2 to mid LAD (overlapping previous stent) & distal LAD,. Continue Imdur, lisinopril, atorvastatin, Brilinta, metoprolol, and sublingual nitroglycerin as needed for severe chest pain. Hyperlipidemia, chronic, no recent lipid profile SVT intermittent, stable, previous EKG was unremarkable. His echocardiogram from 04/16/2022 revealed normal LV systolic function with an estimated EF of >55%, septal wall motion abnormalities were noted and an irregular heart rhythm was captured throughout the examination. A 72-hour Holter monitor was placed during the visit on 04/22/2022 showed no evidence of atrial fibrillation or atrial flutter. Tobacco use, recommend tobacco cessation COPD, chronic, stable. Dyspnea on exertion is chronic and remains unchanged. Echo as above. Continue diuresis with furosemide as needed. Management per PCP. Recommend following up with pulmonary. History of recurrent DVT, chronic, fairly stable. Follow up with PCP as scheduled. Follow up with vascular surgery. Have patient follow up in 6 months

## 2022-09-17 ENCOUNTER — Other Ambulatory Visit: Payer: Self-pay | Admitting: Gastroenterology

## 2022-09-17 DIAGNOSIS — R1319 Other dysphagia: Secondary | ICD-10-CM

## 2022-09-17 DIAGNOSIS — K219 Gastro-esophageal reflux disease without esophagitis: Secondary | ICD-10-CM

## 2022-09-17 DIAGNOSIS — R634 Abnormal weight loss: Secondary | ICD-10-CM

## 2022-09-23 ENCOUNTER — Ambulatory Visit: Payer: Medicaid Other | Admitting: Anesthesiology

## 2022-09-23 ENCOUNTER — Encounter: Payer: Self-pay | Admitting: Orthopedic Surgery

## 2022-09-23 ENCOUNTER — Other Ambulatory Visit: Payer: Self-pay

## 2022-09-23 ENCOUNTER — Encounter
Admission: RE | Disposition: A | Discharge status: Home / Self Care | Payer: Self-pay | Source: Ambulatory Visit | Attending: Orthopedic Surgery

## 2022-09-23 ENCOUNTER — Ambulatory Visit: Payer: Medicaid Other

## 2022-09-23 ENCOUNTER — Ambulatory Visit
Admission: RE | Admit: 2022-09-23 | Discharge: 2022-09-23 | Disposition: A | Discharge status: Home / Self Care | Payer: Medicaid Other | Source: Ambulatory Visit | Attending: Orthopedic Surgery | Admitting: Orthopedic Surgery

## 2022-09-23 DIAGNOSIS — M75101 Unspecified rotator cuff tear or rupture of right shoulder, not specified as traumatic: Secondary | ICD-10-CM | POA: Diagnosis present

## 2022-09-23 DIAGNOSIS — Z86718 Personal history of other venous thrombosis and embolism: Secondary | ICD-10-CM | POA: Diagnosis not present

## 2022-09-23 DIAGNOSIS — J449 Chronic obstructive pulmonary disease, unspecified: Secondary | ICD-10-CM | POA: Diagnosis not present

## 2022-09-23 DIAGNOSIS — I252 Old myocardial infarction: Secondary | ICD-10-CM | POA: Insufficient documentation

## 2022-09-23 DIAGNOSIS — M19011 Primary osteoarthritis, right shoulder: Secondary | ICD-10-CM | POA: Diagnosis not present

## 2022-09-23 DIAGNOSIS — Z87891 Personal history of nicotine dependence: Secondary | ICD-10-CM | POA: Diagnosis not present

## 2022-09-23 DIAGNOSIS — I509 Heart failure, unspecified: Secondary | ICD-10-CM | POA: Diagnosis not present

## 2022-09-23 DIAGNOSIS — I110 Hypertensive heart disease with heart failure: Secondary | ICD-10-CM | POA: Diagnosis not present

## 2022-09-23 DIAGNOSIS — M25811 Other specified joint disorders, right shoulder: Secondary | ICD-10-CM | POA: Insufficient documentation

## 2022-09-23 DIAGNOSIS — Z7901 Long term (current) use of anticoagulants: Secondary | ICD-10-CM | POA: Insufficient documentation

## 2022-09-23 HISTORY — PX: SHOULDER ARTHROSCOPY WITH SUBACROMIAL DECOMPRESSION AND OPEN ROTATOR C: SHX5688

## 2022-09-23 LAB — PROTIME-INR
INR: 1 (ref 0.8–1.2)
Prothrombin Time: 12.9 seconds (ref 11.4–15.2)

## 2022-09-23 SURGERY — SHOULDER ARTHROSCOPY WITH SUBACROMIAL DECOMPRESSION AND OPEN ROTATOR CUFF REPAIR, OPEN BICEPS TENDON REPAIR
Anesthesia: Regional | Site: Shoulder | Laterality: Right

## 2022-09-23 MED ORDER — BUPIVACAINE LIPOSOME 1.3 % IJ SUSP
INTRAMUSCULAR | Status: AC
  Filled 2022-09-23: qty 20

## 2022-09-23 MED ORDER — VASOPRESSIN 20 UNIT/ML IV SOLN
INTRAVENOUS | Status: AC
  Filled 2022-09-23: qty 1

## 2022-09-23 MED ORDER — DEXAMETHASONE SODIUM PHOSPHATE 10 MG/ML IJ SOLN
INTRAMUSCULAR | Status: AC
  Filled 2022-09-23: qty 1

## 2022-09-23 MED ORDER — SUGAMMADEX SODIUM 200 MG/2ML IV SOLN
INTRAVENOUS | Status: DC | PRN
  Administered 2022-09-23: 200 mg via INTRAVENOUS

## 2022-09-23 MED ORDER — LACTATED RINGERS IR SOLN
Status: DC | PRN
  Administered 2022-09-23 (×12): 3000 mL

## 2022-09-23 MED ORDER — ACETAMINOPHEN 10 MG/ML IV SOLN: 1000.0000 mg | Freq: Once | INTRAVENOUS | Status: DC | PRN

## 2022-09-23 MED ORDER — BUPIVACAINE LIPOSOME 1.3 % IJ SUSP
INTRAMUSCULAR | Status: DC | PRN
  Administered 2022-09-23: 20 mL

## 2022-09-23 MED ORDER — ACETAMINOPHEN 500 MG PO TABS: 1000.0000 mg | ORAL_TABLET | Freq: Three times a day (TID) | ORAL | 2 refills | Status: AC

## 2022-09-23 MED ORDER — EPHEDRINE 5 MG/ML INJ
INTRAVENOUS | Status: AC
  Filled 2022-09-23: qty 5

## 2022-09-23 MED ORDER — ALBUMIN HUMAN 5 % IV SOLN
INTRAVENOUS | Status: AC
  Filled 2022-09-23: qty 500

## 2022-09-23 MED ORDER — PROPOFOL 10 MG/ML IV BOLUS
INTRAVENOUS | Status: DC | PRN
  Administered 2022-09-23: 100 mg via INTRAVENOUS

## 2022-09-23 MED ORDER — BUPIVACAINE HCL (PF) 0.5 % IJ SOLN
INTRAMUSCULAR | Status: AC
  Filled 2022-09-23: qty 10

## 2022-09-23 MED ORDER — LACTATED RINGERS IV SOLN: INTRAVENOUS | Status: DC

## 2022-09-23 MED ORDER — FENTANYL CITRATE (PF) 100 MCG/2ML IJ SOLN
INTRAMUSCULAR | Status: AC
  Filled 2022-09-23: qty 2

## 2022-09-23 MED ORDER — ROCURONIUM BROMIDE 10 MG/ML (PF) SYRINGE
PREFILLED_SYRINGE | INTRAVENOUS | Status: AC
  Filled 2022-09-23: qty 10

## 2022-09-23 MED ORDER — ONDANSETRON 4 MG PO TBDP: 4.0000 mg | ORAL_TABLET | Freq: Three times a day (TID) | ORAL | 0 refills | Status: DC | PRN

## 2022-09-23 MED ORDER — CHLORHEXIDINE GLUCONATE 0.12 % MT SOLN
OROMUCOSAL | Status: AC
  Administered 2022-09-23: 15 mL via OROMUCOSAL
  Filled 2022-09-23: qty 15

## 2022-09-23 MED ORDER — CHLORHEXIDINE GLUCONATE 0.12 % MT SOLN: 15.0000 mL | Freq: Once | OROMUCOSAL | Status: AC

## 2022-09-23 MED ORDER — GLYCOPYRROLATE 0.2 MG/ML IJ SOLN
INTRAMUSCULAR | Status: AC
  Filled 2022-09-23: qty 1

## 2022-09-23 MED ORDER — FENTANYL CITRATE PF 50 MCG/ML IJ SOSY
PREFILLED_SYRINGE | INTRAMUSCULAR | Status: AC
  Administered 2022-09-23: 50 ug via INTRAVENOUS
  Filled 2022-09-23: qty 1

## 2022-09-23 MED ORDER — BUPIVACAINE HCL (PF) 0.5 % IJ SOLN
INTRAMUSCULAR | Status: DC | PRN
  Administered 2022-09-23: 10 mL

## 2022-09-23 MED ORDER — LACTATED RINGERS IV SOLN
INTRAVENOUS | Status: DC | PRN
  Administered 2022-09-23 (×4): 3001 mL

## 2022-09-23 MED ORDER — ONDANSETRON HCL 4 MG/2ML IJ SOLN: 4.0000 mg | Freq: Once | INTRAMUSCULAR | Status: DC | PRN

## 2022-09-23 MED ORDER — PHENYLEPHRINE HCL-NACL 20-0.9 MG/250ML-% IV SOLN
INTRAVENOUS | Status: DC | PRN
  Administered 2022-09-23: 100 ug/min via INTRAVENOUS

## 2022-09-23 MED ORDER — OXYCODONE HCL 5 MG PO TABS: 5.0000 mg | ORAL_TABLET | ORAL | 0 refills | Status: DC | PRN

## 2022-09-23 MED ORDER — FENTANYL CITRATE (PF) 100 MCG/2ML IJ SOLN: 25.0000 ug | INTRAMUSCULAR | Status: DC | PRN

## 2022-09-23 MED ORDER — MIDAZOLAM HCL 2 MG/2ML IJ SOLN: 1.0000 mg | INTRAMUSCULAR | Status: DC | PRN

## 2022-09-23 MED ORDER — ROCURONIUM BROMIDE 100 MG/10ML IV SOLN
INTRAVENOUS | Status: DC | PRN
  Administered 2022-09-23 (×2): 20 mg via INTRAVENOUS
  Administered 2022-09-23: 30 mg via INTRAVENOUS
  Administered 2022-09-23: 10 mg via INTRAVENOUS

## 2022-09-23 MED ORDER — ALBUTEROL SULFATE HFA 108 (90 BASE) MCG/ACT IN AERS
INHALATION_SPRAY | RESPIRATORY_TRACT | Status: DC | PRN
  Administered 2022-09-23 (×2): 4 via RESPIRATORY_TRACT

## 2022-09-23 MED ORDER — DEXAMETHASONE SODIUM PHOSPHATE 10 MG/ML IJ SOLN
INTRAMUSCULAR | Status: DC | PRN
  Administered 2022-09-23: 10 mg via INTRAVENOUS

## 2022-09-23 MED ORDER — ORAL CARE MOUTH RINSE: 15.0000 mL | Freq: Once | OROMUCOSAL | Status: AC

## 2022-09-23 MED ORDER — FENTANYL CITRATE PF 50 MCG/ML IJ SOSY: 50.0000 ug | PREFILLED_SYRINGE | Freq: Once | INTRAMUSCULAR | Status: AC

## 2022-09-23 MED ORDER — ALBUTEROL SULFATE HFA 108 (90 BASE) MCG/ACT IN AERS
INHALATION_SPRAY | RESPIRATORY_TRACT | Status: AC
  Filled 2022-09-23: qty 6.7

## 2022-09-23 MED ORDER — OXYCODONE HCL 5 MG/5ML PO SOLN: 5.0000 mg | Freq: Once | ORAL | Status: AC | PRN

## 2022-09-23 MED ORDER — ACETAMINOPHEN 10 MG/ML IV SOLN
INTRAVENOUS | Status: DC | PRN
  Administered 2022-09-23: 1000 mg via INTRAVENOUS

## 2022-09-23 MED ORDER — ONDANSETRON HCL 4 MG/2ML IJ SOLN
INTRAMUSCULAR | Status: AC
  Filled 2022-09-23: qty 2

## 2022-09-23 MED ORDER — PHENYLEPHRINE HCL (PRESSORS) 10 MG/ML IV SOLN
INTRAVENOUS | Status: DC | PRN
  Administered 2022-09-23: 160 ug via INTRAVENOUS
  Administered 2022-09-23 (×2): 80 ug via INTRAVENOUS
  Administered 2022-09-23 (×2): 160 ug via INTRAVENOUS

## 2022-09-23 MED ORDER — EPINEPHRINE PF 1 MG/ML IJ SOLN
INTRAMUSCULAR | Status: AC
  Filled 2022-09-23: qty 4

## 2022-09-23 MED ORDER — GLYCOPYRROLATE 0.2 MG/ML IJ SOLN
INTRAMUSCULAR | Status: DC | PRN
  Administered 2022-09-23: .2 mg via INTRAVENOUS

## 2022-09-23 MED ORDER — ACETAMINOPHEN 10 MG/ML IV SOLN
INTRAVENOUS | Status: AC
  Filled 2022-09-23: qty 100

## 2022-09-23 MED ORDER — EPHEDRINE SULFATE (PRESSORS) 50 MG/ML IJ SOLN
INTRAMUSCULAR | Status: DC | PRN
  Administered 2022-09-23: 15 mg via INTRAVENOUS
  Administered 2022-09-23: 10 mg via INTRAVENOUS
  Administered 2022-09-23: 5 mg via INTRAVENOUS
  Administered 2022-09-23 (×2): 10 mg via INTRAVENOUS
  Administered 2022-09-23: 15 mg via INTRAVENOUS

## 2022-09-23 MED ORDER — LACTATED RINGERS IV SOLN: INTRAVENOUS | Status: DC | PRN

## 2022-09-23 MED ORDER — BUPIVACAINE HCL (PF) 0.5 % IJ SOLN
INTRAMUSCULAR | Status: AC
  Filled 2022-09-23: qty 30

## 2022-09-23 MED ORDER — MIDAZOLAM HCL 2 MG/2ML IJ SOLN
INTRAMUSCULAR | Status: AC
  Administered 2022-09-23: 1 mg via INTRAVENOUS
  Filled 2022-09-23: qty 2

## 2022-09-23 MED ORDER — PHENYLEPHRINE HCL-NACL 20-0.9 MG/250ML-% IV SOLN
INTRAVENOUS | Status: AC
  Filled 2022-09-23: qty 250

## 2022-09-23 MED ORDER — LIDOCAINE HCL (PF) 2 % IJ SOLN
INTRAMUSCULAR | Status: AC
  Filled 2022-09-23: qty 5

## 2022-09-23 MED ORDER — VASOPRESSIN 20 UNIT/ML IV SOLN
INTRAVENOUS | Status: DC | PRN
  Administered 2022-09-23 (×4): 1 [IU] via INTRAVENOUS

## 2022-09-23 MED ORDER — OXYCODONE HCL 5 MG PO TABS
ORAL_TABLET | ORAL | Status: AC
  Filled 2022-09-23: qty 1

## 2022-09-23 MED ORDER — CEFAZOLIN SODIUM-DEXTROSE 2-4 GM/100ML-% IV SOLN
2.0000 g | INTRAVENOUS | Status: AC
  Administered 2022-09-23: 2 g via INTRAVENOUS

## 2022-09-23 MED ORDER — ALBUMIN HUMAN 5 % IV SOLN
25.0000 g | Freq: Once | INTRAVENOUS | Status: AC
  Administered 2022-09-23: 25 g via INTRAVENOUS

## 2022-09-23 MED ORDER — OXYCODONE HCL 5 MG PO TABS
5.0000 mg | ORAL_TABLET | Freq: Once | ORAL | Status: AC | PRN
  Administered 2022-09-23: 5 mg via ORAL

## 2022-09-23 MED ORDER — CEFAZOLIN SODIUM-DEXTROSE 2-4 GM/100ML-% IV SOLN
INTRAVENOUS | Status: AC
  Filled 2022-09-23: qty 100

## 2022-09-23 SURGICAL SUPPLY — 91 items
ADAPTER IRRIG TUBE 2 SPIKE SOL (ADAPTER) ×2 IMPLANT
ADH SKN CLS APL DERMABOND .7 (GAUZE/BANDAGES/DRESSINGS)
ADPR TBG 2 SPK PMP STRL ASCP (ADAPTER) ×2
ANCH SUT .5 CRC TPR CT 40X40 (SUTURE) ×2
ANCH SUT 1.4 SUT TPE BLK/WHT (SUTURE) ×2
ANCH SUT 2 SWLK 19.1 CLS EYLT (Anchor) ×4 IMPLANT
ANCH SUT 2.9 PUSHLOCK ANCH (Orthopedic Implant) ×1 IMPLANT
ANCH SUT 2X2.3 2 STRN TPE (Anchor) ×2 IMPLANT
ANCHOR ICONIX SPEED 2.0 TAPE (Anchor) IMPLANT
ANCHOR SWIVELOCK BIO 4.75X19.1 (Anchor) ×1 IMPLANT
APL PRP STRL LF DISP 70% ISPRP (MISCELLANEOUS) ×2
BLADE SHAVER 4.5X7 STR FR (MISCELLANEOUS) ×1 IMPLANT
BNDG ADH 2 X3.75 FABRIC TAN LF (GAUZE/BANDAGES/DRESSINGS) ×1 IMPLANT
BNDG ADH XL 3.75X2 STRCH LF (GAUZE/BANDAGES/DRESSINGS) ×1
BUR BR 5.5 WIDE MOUTH (BURR) IMPLANT
CANNULA PART THRD DISP 5.75X7 (CANNULA) IMPLANT
CANNULA PARTIAL THREAD 2X7 (CANNULA) IMPLANT
CANNULA TWIST IN 8.25X7CM (CANNULA) IMPLANT
CANNULA TWIST IN 8.25X9CM (CANNULA) IMPLANT
CHLORAPREP W/TINT 26 (MISCELLANEOUS) ×1 IMPLANT
COOLER POLAR GLACIER W/PUMP (MISCELLANEOUS) ×1 IMPLANT
DERMABOND ADVANCED .7 DNX12 (GAUZE/BANDAGES/DRESSINGS) IMPLANT
DEVICE SUCT BLK HOLE OR FLOOR (MISCELLANEOUS) ×1 IMPLANT
DRAPE 3/4 80X56 (DRAPES) ×1 IMPLANT
DRAPE INCISE IOBAN 66X45 STRL (DRAPES) ×1 IMPLANT
DRAPE POUCH INSTRU U-SHP 10X18 (DRAPES) IMPLANT
DRAPE U-SHAPE 47X51 STRL (DRAPES) ×2 IMPLANT
DRSG TEGADERM 4X4.75 (GAUZE/BANDAGES/DRESSINGS) ×3 IMPLANT
ELECT REM PT RETURN 9FT ADLT (ELECTROSURGICAL) ×1
ELECTRODE REM PT RTRN 9FT ADLT (ELECTROSURGICAL) ×1 IMPLANT
GAUZE SPONGE 4X4 12PLY STRL (GAUZE/BANDAGES/DRESSINGS) ×1 IMPLANT
GAUZE XEROFORM 1X8 LF (GAUZE/BANDAGES/DRESSINGS) ×1 IMPLANT
GLOVE BIO SURGEON STRL SZ7.5 (GLOVE) ×1 IMPLANT
GLOVE BIOGEL PI IND STRL 8 (GLOVE) ×2 IMPLANT
GLOVE SURG ORTHO 8.0 STRL STRW (GLOVE) ×1 IMPLANT
GLOVE SURG SYN 8.0 (GLOVE) ×1 IMPLANT
GLOVE SURG SYN 8.0 PF PI (GLOVE) ×1 IMPLANT
GOWN STRL REUS W/ TWL LRG LVL3 (GOWN DISPOSABLE) ×2 IMPLANT
GOWN STRL REUS W/TWL LRG LVL3 (GOWN DISPOSABLE) ×2
GOWN STRL REUS W/TWL XL LVL4 (GOWN DISPOSABLE) ×1 IMPLANT
IV LACTATED RINGER IRRG 3000ML (IV SOLUTION) ×4
IV LR IRRIG 3000ML ARTHROMATIC (IV SOLUTION) ×4 IMPLANT
KIT CORKSCREW KNTLS 3.9 S/T/P (INSTRUMENTS) IMPLANT
KIT STABILIZATION SHOULDER (MISCELLANEOUS) ×1 IMPLANT
KIT SUTURETAK 3.0 INSERT PERC (KITS) IMPLANT
KIT TURNOVER KIT A (KITS) ×1 IMPLANT
MANIFOLD NEPTUNE II (INSTRUMENTS) ×2 IMPLANT
MASK FACE SPIDER DISP (MASK) ×1 IMPLANT
MAT ABSORB  FLUID 56X50 GRAY (MISCELLANEOUS) ×2
MAT ABSORB FLUID 56X50 GRAY (MISCELLANEOUS) ×2 IMPLANT
NDL MAYO CATGUT SZ5 (NEEDLE)
NDL SAFETY ECLIP 18X1.5 (MISCELLANEOUS) ×1 IMPLANT
NDL SCORPION MULTI FIRE (NEEDLE) IMPLANT
NDL SUT 5 .5 CRC TPR PNT MAYO (NEEDLE) IMPLANT
NEEDLE SCORPION MULTI FIRE (NEEDLE) IMPLANT
PACK ARTHROSCOPY SHOULDER (MISCELLANEOUS) ×1 IMPLANT
PAD ARMBOARD 7.5X6 YLW CONV (MISCELLANEOUS) ×1 IMPLANT
PAD WRAPON POLAR SHDR XLG (MISCELLANEOUS) ×1 IMPLANT
PASSER SUT FIRSTPASS SELF (INSTRUMENTS) ×1 IMPLANT
PASSER SUT SWIFTSTITCH HIP CRT (INSTRUMENTS) ×1 IMPLANT
SHAVER BLADE BONE CUTTER  5.5 (BLADE) ×1
SHAVER BLADE BONE CUTTER 5.5 (BLADE) ×1 IMPLANT
SLEEVE REMOTE CONTROL 5X12 (DRAPES) IMPLANT
SLING ULTRA II M (MISCELLANEOUS) ×1 IMPLANT
SPONGE INTESTINAL PEANUT (DISPOSABLE) IMPLANT
SPONGE T-LAP 18X18 ~~LOC~~+RFID (SPONGE) ×1 IMPLANT
STAPLER SKIN PROX 35W (STAPLE) IMPLANT
STRAP SAFETY 5IN WIDE (MISCELLANEOUS) ×1 IMPLANT
SUT ETHILON 3-0 (SUTURE) ×1 IMPLANT
SUT ETHILON 3-0 FS-10 30 BLK (SUTURE) ×1
SUT LASSO 90 DEG CVD (SUTURE) IMPLANT
SUT LASSO 90 DEG SD STR (SUTURE) IMPLANT
SUT MNCRL 4-0 (SUTURE)
SUT MNCRL 4-0 27XMFL (SUTURE)
SUT PROLENE 0 CT 2 (SUTURE) IMPLANT
SUT VIC AB 0 CT1 36 (SUTURE) IMPLANT
SUT VIC AB 2-0 CT2 27 (SUTURE) IMPLANT
SUT XBRAID 1.4 BLK/WHT (SUTURE) IMPLANT
SUT XBRAID 1.4 BLUE (SUTURE) IMPLANT
SUTURE EHLN 3-0 FS-10 30 BLK (SUTURE) IMPLANT
SUTURE MNCRL 4-0 27XMF (SUTURE) IMPLANT
SYSTEM IMPL TENODESIS LNT 2.9 (Orthopedic Implant) IMPLANT
TAPE CLOTH 3X10 WHT NS LF (GAUZE/BANDAGES/DRESSINGS) ×1 IMPLANT
TAPE MICROFOAM 4IN (TAPE) ×1 IMPLANT
TRAP FLUID SMOKE EVACUATOR (MISCELLANEOUS) ×1 IMPLANT
TUBING CONNECTING 10 (TUBING) IMPLANT
TUBING INFLOW SET DBFLO PUMP (TUBING) ×1 IMPLANT
TUBING OUTFLOW SET DBLFO PUMP (TUBING) ×1 IMPLANT
WAND WEREWOLF FLOW 90D (MISCELLANEOUS) ×1 IMPLANT
WATER STERILE IRR 500ML POUR (IV SOLUTION) ×1 IMPLANT
WRAPON POLAR PAD SHDR XLG (MISCELLANEOUS) ×1

## 2022-09-23 NOTE — Anesthesia Preprocedure Evaluation (Addendum)
Anesthesia Evaluation  Patient identified by MRN, date of birth, ID band Patient awake    Reviewed: Allergy & Precautions, NPO status , Patient's Chart, lab work & pertinent test results  History of Anesthesia Complications Negative for: history of anesthetic complications  Airway Mallampati: I   Neck ROM: Full    Dental  (+) Edentulous Upper, Edentulous Lower   Pulmonary COPD, former smoker (quit 05/2022)   Pulmonary exam normal breath sounds clear to auscultation       Cardiovascular hypertension, + CAD (s/p stents on Brilinta and warfarin, last dose of both 09/12/22), + Past MI and +CHF  Normal cardiovascular exam Rhythm:Regular Rate:Normal  Hx DVT  ECG 09/10/22: normal  Echo 04/16/22:  NORMAL LEFT VENTRICULAR SYSTOLIC FUNCTION  NORMAL RIGHT VENTRICULAR SYSTOLIC FUNCTION  TRIVIAL REGURGITATION NOTED  NO VALVULAR STENOSIS  IRREGULAR HEART RHYTHM CAPTURED THROUGHOUT EXAM  ESTIMATED LVEF >55%  (CALCULATED: 56.4%)  *EF WITHIN NORMAL LIMITS: HOWEVER SEPTAL WALL MOTION ABNORMALITY NOTED  GLS: -10.9%   Myocardial perfusion 01/29/22:  LVEF= 56%   FINDINGS:  Regional wall motion:  reveals normal myocardial thickening and wall motion.  The overall quality of the study is fair.   Artifacts noted: yes  Left ventricular cavity: normal.  Perfusion Analysis:  SPECT images demonstrate moderate perfusion abnormality of mild intensity is present in the infereior region on the stress images. Defect type : Fixed    Neuro/Psych  PSYCHIATRIC DISORDERS (PTSD)   Bipolar Disorder   negative neurological ROS     GI/Hepatic hiatal hernia, PUD,GERD  ,,  Endo/Other  Hypothyroidism    Renal/GU Renal disease (nephrolithiasis)     Musculoskeletal   Abdominal   Peds  Hematology negative hematology ROS (+)   Anesthesia Other Findings Cardiology note 09/11/22:  Plan  Preoperative clearance for upcoming shoulder surgery, patient is at  moderate risk and is cleared from a cardiac standpoint for surgery. Hold Brilinta and Warfarin 5 days before procedure, re-start 2 days after procedure h/o NSTEMI CAD with unstable angina, S/p PCI/DES to LAD (03/2021), S/p PCI/DES x2 to mid LAD (overlapping previous stent) & distal LAD,. Continue Imdur, lisinopril, atorvastatin, Brilinta, metoprolol, and sublingual nitroglycerin as needed for severe chest pain. Hyperlipidemia, chronic, no recent lipid profile SVT intermittent, stable, previous EKG was unremarkable. His echocardiogram from 04/16/2022 revealed normal LV systolic function with an estimated EF of >55%, septal wall motion abnormalities were noted and an irregular heart rhythm was captured throughout the examination. A 72-hour Holter monitor was placed during the visit on 04/22/2022 showed no evidence of atrial fibrillation or atrial flutter. Tobacco use, recommend tobacco cessation COPD, chronic, stable. Dyspnea on exertion is chronic and remains unchanged. Echo as above. Continue diuresis with furosemide as needed. Management per PCP. Recommend following up with pulmonary. History of recurrent DVT, chronic, fairly stable. Follow up with PCP as scheduled. Follow up with vascular surgery. Have patient follow up in 6 months    Cardiology note 08/15/22:  62 y.o. male with  1. Chest pain at rest  2. NSTEMI (non-ST elevated myocardial infarction) (CMS-HCC)  3. Coronary artery disease of native artery of native heart with stable angina pectoris (CMS-HCC)  4. Presence of drug coated stent in LAD coronary artery  5. Mixed hyperlipidemia  6. SVT (supraventricular tachycardia)  7. Tobacco use  8. Chronic obstructive pulmonary disease, unspecified COPD type (CMS-HCC)  9. History of DVT (deep vein thrombosis)  10. Cardiac arrhythmia, unspecified cardiac arrhythmia type  11. Peripheral edema   Plan  History of  non-STEMI status post PCI and stent continue current medical therapy Continue Coumadin  therapy for anticoagulation Hypertension currently on lisinopril and metoprolol Maintain Brilinta metoprolol lisinopril Lasix Hyperlipidemia continue Lipitor therapy for lipid management COPD continue inhalers as necessary for shortness of breath symptoms Previous history of tobacco abuse continue to manage patient From cigarette smoking Peripheral edema continue support stockings elevation continue Lasix Have the patient follow-up in 6 months  Return in about 3 months (around 11/14/2022).  DWAYNE Salome Arnt, MD    Reproductive/Obstetrics                              Anesthesia Physical Anesthesia Plan  ASA: 3  Anesthesia Plan: General and Regional   Post-op Pain Management: Regional block*   Induction: Intravenous  PONV Risk Score and Plan: 2 and Ondansetron, Dexamethasone and Treatment may vary due to age or medical condition  Airway Management Planned: Oral ETT  Additional Equipment:   Intra-op Plan:   Post-operative Plan: Extubation in OR  Informed Consent: I have reviewed the patients History and Physical, chart, labs and discussed the procedure including the risks, benefits and alternatives for the proposed anesthesia with the patient or authorized representative who has indicated his/her understanding and acceptance.     Dental advisory given  Plan Discussed with: CRNA  Anesthesia Plan Comments: (Plan for preoperative interscalene nerve block and GETA.  Patient consented for risks of anesthesia including but not limited to:  - adverse reactions to medications - damage to eyes, teeth, lips or other oral mucosa - nerve damage due to positioning  - sore throat or hoarseness - damage to heart, brain, nerves, lungs, other parts of body or loss of life  Informed patient about role of CRNA in peri- and intra-operative care.  Patient voiced understanding.)         Anesthesia Quick Evaluation

## 2022-09-23 NOTE — H&P (Signed)
Paper H&P to be scanned into permanent record. H&P reviewed. No significant changes noted.  

## 2022-09-23 NOTE — Anesthesia Procedure Notes (Signed)
Procedure Name: Intubation Date/Time: 09/23/2022 12:50 PM  Performed by: Hilbert Odor, CRNAPre-anesthesia Checklist: Patient identified, Patient being monitored, Timeout performed, Emergency Drugs available and Suction available Patient Re-evaluated:Patient Re-evaluated prior to induction Oxygen Delivery Method: Circle system utilized Preoxygenation: Pre-oxygenation with 100% oxygen Induction Type: IV induction Ventilation: Mask ventilation without difficulty Laryngoscope Size: 3 and McGraph Grade View: Grade I Tube type: Oral Tube size: 7.5 mm Number of attempts: 1 Airway Equipment and Method: Stylet Placement Confirmation: ETT inserted through vocal cords under direct vision, positive ETCO2 and breath sounds checked- equal and bilateral Secured at: 21 cm Tube secured with: Tape Dental Injury: Teeth and Oropharynx as per pre-operative assessment

## 2022-09-23 NOTE — Discharge Instructions (Addendum)
POLAR CARE INFORMATION  [email or web address]  How to use Bier Cold Therapy System?  YouTube   [email or web address]  OPERATING INSTRUCTIONS  Start the product With dry hands, connect the transformer to the electrical connection located on the top of the cooler. Next, plug the transformer into an appropriate electrical outlet. The unit will automatically start running at this point.  To stop the pump, disconnect electrical power.  Unplug to stop the product when not in use. Unplugging the Polar Care unit turns it off. Always unplug immediately after use. Never leave it plugged in while unattended. Remove pad.    FIRST ADD WATER TO FILL LINE, THEN ICE---Replace ice when existing ice is almost melted  1 Discuss Treatment with your [place] Practitioner and Use Only as Prescribed 2 Apply Insulation Barrier & Cold Therapy Pad 3 Check for Moisture 4 Inspect Skin Regularly  Tips and Trouble Shooting Usage Tips 1. Use cubed or chunked ice for optimal performance. 2. It is recommended to drain the Pad between uses. To drain the pad, hold the Pad upright with the hose pointed toward the ground. Depress the black plunger and allow water to drain out. 3. You may disconnect the Pad from the unit without removing the pad from the affected area by depressing the silver tabs on the hose coupling and gently pulling the hoses apart. The Pad and unit will seal itself and will not leak. Note: Some dripping during release is normal. 4. DO NOT RUN PUMP WITHOUT WATER! The pump in this unit is designed to run with water. Running the unit without water will cause permanent damage to the pump. 5. Unplug unit before removing lid.  TROUBLESHOOTING GUIDE Pump not running, Water not flowing to the pad, Pad is not getting cold 1. Make sure the transformer is plugged into the wall outlet. 2. Confirm that the ice and water are filled to the indicated levels. 3. Make sure  there are no kinks in the pad. 4. Gently pull on the blue tube to make sure the tube/pad junction is straight. 5. Remove the pad from the treatment site and ll it while the pad is lying at; then reapply. 6. Confirm that the pad couplings are securely attached to the unit. Listen for the double clicks (Figure 1) to confirm the pad couplings are securely attached.  Leaks    Note: Some condensation on the lines, controller, and pads is unavoidable, especially in warmer climates. 1. If using a [facility] Polar Care Cold Therapy unit with a detachable Cold Therapy Pad, and a leak exists (other than condensation on the lines) disconnect the pad couplings. Make sure the silver tabs on the couplings are depressed before reconnecting the pad to the pump hose; then confirm both sides of the coupling are properly clicked in. 2. If the coupling continues to leak or a leak is detected in the pad itself, stop using it and call [place] at [phone].  Cleaning After use, empty and dry the unit with a soft cloth. Warm water and mild detergent may be used occasionally to clean the pump and tubes.  WARNING: [place] can be cold enough to cause serious injury, including full skin necrosis. Follow these Operating Instructions, and carefully read the Product Insert (see pouch on side of unit) and the Cold Therapy Pad Fitting Instructions (provided with each Cold Therapy Pad) prior to use.       Post-Op Instructions - Rotator Cuff Repair  1.  Bracing: You will wear a shoulder immobilizer or sling for 6 weeks.   2. Driving: No driving for 3 weeks post-op. When driving, do not wear the immobilizer. Ideally, we recommend no driving for 6 weeks while sling is in place as one arm will be immobilized.   3. Activity: No active lifting for 2 months. Wrist, hand, and elbow motion only. Avoid lifting the upper arm away from the body except for hygiene. You are permitted to bend and straighten the elbow  passively only (no active elbow motion). You may use your hand and wrist for typing, writing, and managing utensils (cutting food). Do not lift more than a coffee cup for 8 weeks.  When sleeping or resting, inclined positions (recliner chair or wedge pillow) and a pillow under the forearm for support may provide better comfort for up to 4 weeks.  Avoid long distance travel for 4 weeks.  Return to normal activities after rotator cuff repair repair normally takes 6 months on average. If rehab goes very well, may be able to do most activities at 4 months, except overhead or contact sports.  4. Physical Therapy: Begins 3-4 days after surgery, and proceed 1 time per week for the first 6 weeks, then 1-2 times per week from weeks 6-20 post-op.  5. Medications:  - You will be provided a prescription for narcotic pain medicine. After surgery, take 1-2 narcotic tablets every 4 hours if needed for severe pain.  - A prescription for anti-nausea medication will be provided in case the narcotic medicine causes nausea - take 1 tablet every 6 hours only if nauseated.   - Take tylenol 1000 mg (2 Extra Strength tablets or 3 regular strength) every 8 hours for pain.  May decrease or stop tylenol 5 days after surgery if you are having minimal pain. - Resume home dosing of warfarin today and Brilinta tomorrow - DO NOT take ANY nonsteroidal anti-inflammatory pain medications (Advil, Motrin, Ibuprofen, Aleve, Naproxen, or Naprosyn). These medicines can inhibit healing of your shoulder repair.    If you are taking prescription medication for anxiety, depression, insomnia, muscle spasm, chronic pain, or for attention deficit disorder, you are advised that you are at a higher risk of adverse effects with use of narcotics post-op, including narcotic addiction/dependence, depressed breathing, death. If you use non-prescribed substances: alcohol, marijuana, cocaine, heroin, methamphetamines, etc., you are at a higher risk of  adverse effects with use of narcotics post-op, including narcotic addiction/dependence, depressed breathing, death. You are advised that taking > 50 morphine milligram equivalents (MME) of narcotic pain medication per day results in twice the risk of overdose or death. For your prescription provided: oxycodone 5 mg - taking more than 6 tablets per day would result in > 50 morphine milligram equivalents (MME) of narcotic pain medication. Be advised that we will prescribe narcotics short-term, for acute post-operative pain only - 3 weeks for major operations such as shoulder repair/reconstruction surgeries.     6. Post-Op Appointment:  Your first post-op appointment will be 10-14 days post-op.  7. Work or School: For most, but not all procedures, we advise staying out of work or school for at least 1 to 2 weeks in order to recover from the stress of surgery and to allow time for healing.   If you need a work or school note this can be provided.   8. Smoking: If you are a smoker, you need to refrain from smoking in the postoperative period. The nicotine in cigarettes will inhibit  healing of your shoulder repair and decrease the chance of successful repair. Similarly, nicotine containing products (gum, patches) should be avoided.   Post-operative Brace: Apply and remove the brace you received as you were instructed to at the time of fitting and as described in detail as the brace's instructions for use indicate.  Wear the brace for the period of time prescribed by your physician.  The brace can be cleaned with soap and water and allowed to air dry only.  Should the brace result in increased pain, decreased feeling (numbness/tingling), increased swelling or an overall worsening of your medical condition, please contact your doctor immediately.  If an emergency situation occurs as a result of wearing the brace after normal business hours, please dial 911 and seek immediate medical attention.  Let your  doctor know if you have any further questions about the brace issued to you. Refer to the shoulder sling instructions for use if you have any questions regarding the correct fit of your shoulder sling.  [facility] Troubleshooting: [phone]  Video that illustrates how to properly use a shoulder sling: "Instructions for Proper Use of an Orthopaedic Sling" [email or web address]   AMBULATORY SURGERY  DISCHARGE INSTRUCTIONS   The drugs that you were given will stay in your system until tomorrow so for the next 24 hours you should not:  Drive an automobile Make any legal decisions Drink any alcoholic beverage   You may resume regular meals tomorrow.  Today it is better to start with liquids and gradually work up to solid foods.  You may eat anything you prefer, but it is better to start with liquids, then soup and crackers, and gradually work up to solid foods.   Please notify your doctor immediately if you have any unusual bleeding, trouble breathing, redness and pain at the surgery site, drainage, fever, or pain not relieved by medication.    Additional Instructions:   Please contact your physician with any problems or Same Day Surgery at [phone], Monday through Friday 6 am to 4 pm, or [place] at [facility] number at [phone].  SHOULDER SLING IMMOBILIZER   VIDEO Slingshot 2 Shoulder Brace Application - YouTube ---[email or web address]  INSTRUCTIONS While supporting the injured arm, slide the forearm into the sling. Wrap the adjustable shoulder strap around the neck and shoulders and attach the strap end to the sling using  the "alligator strap tab."  Adjust the shoulder strap to the required length. Position the shoulder pad behind the neck. To secure the shoulder pad location (optional), pull the shoulder strap away from the shoulder pad, unfold the hook material on the top of the pad, then press the  shoulder strap back onto the hook material to secure the pad in place. Attach the closure strap across the open top of the sling. Position the strap so that it holds the arm securely in the sling. Next, attach the thumb strap to the open end of the sling between the thumb and fingers. After sling has been fit, it may be easily removed and reapplied using the quick release buckle on shoulder strap. If a neutral pillow or 15 abduction pillow is included, place the pillow at the waistline. Attach the sling to the pillow, lining up hook material on the pillow with the loop on sling. Adjust the waist strap to fit.  If waist strap is too long, cut it to fit. Use the small piece of double sided hook material (located on top of the pillow) to secure  the strap end. Place the double sided hook material on the inside of the cut strap end and secure it to the waist strap.     If no pillow is included, attach the waist strap to the sling and adjust to fit.    Washing Instructions: Straps and sling must be removed and cleaned regularly depending on your activity level and perspiration. Hand wash straps and sling in cold water with mild detergent, rinse, air dry

## 2022-09-23 NOTE — Anesthesia Procedure Notes (Signed)
Anesthesia Regional Block: Interscalene brachial plexus block   Pre-Anesthetic Checklist: , timeout performed,  Correct Patient, Correct Site, Correct Laterality,  Correct Procedure,, risks and benefits discussed,  Surgical consent,  Pre-op evaluation,  At surgeon's request and post-op pain management  Laterality: Right  Prep: chloraprep       Needles:  Injection technique: Single-shot  Needle Type: Echogenic Needle          Additional Needles:   Procedures:,,,, ultrasound used (permanent image in chart),,   Motor weakness within 20 minutes.  Narrative:  Start time: 09/23/2022 9:52 AM End time: 09/23/2022 9:55 AM Injection made incrementally with aspirations every 5 mL.  Performed by: Personally  Anesthesiologist: Darrin Nipper, MD  Additional Notes: Functioning IV was confirmed and monitors applied.  Sterile prep and drape, hand hygiene and sterile gloves were used. Ultrasound guidance: relevant anatomy identified, needle position confirmed, local anesthetic spread visualized around nerve(s), vascular puncture avoided.  Image saved to electronic medical record.  Negative aspiration prior to incremental administration of local anesthetic for total 20 ml Exparel and 10 ml bupivacaine 0.5% given in interscalene distribution. The patient tolerated the procedure well. Vital signs and moderate sedation medications recorded in RN notes.

## 2022-09-23 NOTE — Transfer of Care (Signed)
Immediate Anesthesia Transfer of Care Note  Patient: Brett Fernandez  Procedure(s) Performed: Right shoulder arthroscopic rotator cuff repair, subacromial decompression, distal clavicle excision, and biceps tenodesis (Right: Shoulder)  Patient Location: PACU  Anesthesia Type:General  Level of Consciousness: drowsy  Airway & Oxygen Therapy: Patient Spontanous Breathing and Patient connected to face mask oxygen  Post-op Assessment: Report given to RN and Post -op Vital signs reviewed and stable  Post vital signs: Reviewed and stable  Last Vitals:  Vitals Value Taken Time  BP 105/65 09/23/22 1610  Temp 35.9 C 09/23/22 1610  Pulse 72 09/23/22 1615  Resp 27 09/23/22 1615  SpO2 99 % 09/23/22 1615  Vitals shown include unvalidated device data.  Last Pain:  Vitals:   09/23/22 0930  TempSrc: Tympanic  PainSc:          Complications: No notable events documented.

## 2022-09-23 NOTE — Op Note (Addendum)
SURGERY DATE: 09/23/2022   PRE-OP DIAGNOSIS:  1. Right subacromial impingement 2. Right biceps tendinopathy 3. Right rotator cuff tear 4. Right acromioclavicular joint arthritis  POST-OP DIAGNOSIS: 1. Right subacromial impingement 2. Right biceps tendinopathy 3. Right rotator cuff tear (subscapularis and supraspinatus) 4. Right acromioclavicular joint arthritis  PROCEDURES:  1. Right arthroscopic rotator cuff repair (full thickness subscapularis and supraspinatus) 2. Right arthroscopic biceps tenodesis 3. Right arthroscopic subacromial decompression 4. Right arthroscopic extensive debridement of shoulder (glenohumeral and subacromial spaces) 5. Right arthroscopic distal clavicle excision  SURGEON: Cato Mulligan, MD   ASSISTANT: Anitra Lauth, PA   ANESTHESIA: Gen with Exparel interscalene block   ESTIMATED BLOOD LOSS: 5cc   DRAINS:  none   TOTAL IV FLUIDS: per anesthesia      SPECIMENS: none   IMPLANTS:  - Arthrex 2.94mm PushLock x 1 - Arthrex 4.104mm SwiveLock x 4 - Iconix SPEED double loaded with 1.2 and 2.33mm tape x 2     OPERATIVE FINDINGS:  Examination under anesthesia: A careful examination under anesthesia was performed.  Passive range of motion was: FF: 150; ER at side: 45; ER in abduction: 90; IR in abduction: 45.  Anterior load shift: NT.  Posterior load shift: NT.  Sulcus in neutral: NT.  Sulcus in ER: NT.     Intra-operative findings: A thorough arthroscopic examination of the shoulder was performed.  The findings are: 1. Biceps tendon: tendinopathy with significant erythema  and medial subluxation 2. Superior labrum: erythema 3. Posterior labrum and capsule: normal 4. Inferior capsule and inferior recess: normal 5. Glenoid cartilage surface: Grade 2 degenerative changes 6. Supraspinatus attachment: full-thickness tear of the supraspinatus 7. Posterior rotator cuff attachment: normal 8. Humeral head articular cartilage: Grade 2-3 degenerative changes 9.  Rotator interval: significant synovitis 10: Subscapularis tendon: full-thickness, retracted tear 11. Anterior labrum: Mildly degenerative 12. IGHL: normal   OPERATIVE REPORT:    Indications for procedure:  Brett Fernandez is a 61 y.o. male with over 1 year of right shoulder pain.  He has had difficulty with overhead motion since that time with sensations of weakness. Clinical exam and MRI were suggestive of rotator cuff tear, biceps tendinopathy, acromioclavicular joint arthritis and subacromial impingement. After discussion of risks, benefits, and alternatives to surgery, the patient elected to proceed.    Procedure in detail:   I identified Brett Fernandez in the pre-operative holding area.  I marked the operative shoulder with my initials. I reviewed the risks and benefits of the proposed surgical intervention, and the patient wished to proceed.  Anesthesia was then performed with an Exparel interscalene block.  The patient was transferred to the operative suite and placed in the beach chair position.     Appropriate IV antibiotics were administered prior to incision. The operative upper extremity was then prepped and draped in standard fashion. A time out was performed confirming the correct extremity, correct patient, and correct procedure.    I then created a standard posterior portal with an 11 blade. The glenohumeral joint was easily entered with a blunt trocar and the arthroscope introduced. The findings of diagnostic arthroscopy are described above. I debrided degenerative tissue including the synovitic tissue about the rotator interval and anterior and superior labrum. I also debrided the regions of degenerative changes to the humeral head and glenoid until there were stable cartilage edges.  I then coagulated the inflamed synovium to obtain hemostasis and reduce the risk of post-operative swelling using an Arthrocare radiofrequency device.   I  then turned my attention to the  arthroscopic biceps tenodesis. The Loop n Tack technique was used to pass a FiberTape through the biceps in a locked fashion adjacent to the biceps anchor.  A hole for a 2.9 mm Arthrex PushLock was drilled in the bicipital groove just superior to the subscapularis tendon insertion.  The biceps tendon was then cut and the biceps anchor complex was debrided down to a stable base on the superior labrum.  The FiberTape was loaded onto the PushLock anchor and impacted into place into the previously drilled hole in the bicipital groove.  This appropriately secured the biceps into the bicipital groove and took it off of tension.   The subscapularis tear was identified.  A superior anterolateral portal was made under needle localization.  A 7 mm cannula was placed.  The comma tissue indicating the superolateral border of the subscapularis was identified readily.  The tip of the coracoid as well as the conjoined tendon and coracoacromial ligaments were visualized after debriding rotator interval tissue.  Tissue about the subscapularis was released anteriorly, superiorly, and posteriorly to allow for improved mobilization.  The comma tissue was tagged with a FiberWire suture and tension on this allowed for appropriate mobilization of the subscapularis.  The lesser tuberosity footprint was prepared with a combination of electrocautery and burr. 2 suture tapes were passed in a mattress fashion.  The traction stitch was removed.  Inferior 2 strands of suture were then loaded onto a 4.75 mm SwiveLock anchor and placed into the  inferior portion of the prepared footprint with the arm in a neutral position.  This process was repeated for the superior 2 strands of suture and placed into a SwiveLock into the superior aspect of the superior footprint. This construct appropriately reduced the subscapularis tear.  The arm was then internally and externally rotated and the subscapularis was noted to move appropriately with rotation.   The remainder of the suture was then cut.   Next, the arthroscope was then introduced into the subacromial space. A direct lateral portal was created with an 11-blade after spinal needle localization. An extensive subacromial bursectomy and debridement was performed using a combination of the shaver and Arthrocare wand. The entire acromial undersurface was exposed and the CA ligament was subperiosteally elevated to expose the anterior acromial hook. A burr was used to create a flat anterior and lateral aspect of the acromion, converting it from a Type 2 to a Type 1 acromion. Care was made to keep the deltoid fascia intact.   I then turned my attention to the arthroscopic distal clavicle excision. I identified the acromioclavicular joint. Surrounding bursal tissue was debrided and the edges of the joint were identified. I used the 5.65mm barrel burr to remove the distal clavicle parallel to the edge of the acromion. I was able to fit two widths of the burr into the space between the distal clavicle and acromion, signifying that I had removed ~7mm of distal clavicle. This was confirmed by viewing anteriorly and introducing a probe with measuring marks from the lateral portal. Hemostasis was achieved with an Arthrocare wand.   Next, I created an accessory posterolateral portal to assist with visualization and instrumentation.  I debrided the poor quality edges of the supraspinatus tendon.  This was a U-shaped tear of the anteriore supraspinatus.  I prepared the footprint using a burr to expose bleeding bone.     I then percutaneously placed 1 Iconix SPEED medial row anchor along the anterior portion of  the tear at the articular margin. Another SPEED anchor was placed along the posterior portion of the tear at the articular margin. I then shuttled all 8 strands of tape through the rotator cuff just lateral to the musculotendinous junction using a FirstPass suture passer spanning the anterior to posterior extent  of the tear. The posterior strands of each suture were passed through an [employer] anchor.  This was placed approximately 2 cm distal to the lateral edge of the footprint in line with the posterior aspect of the tear with appropriate tensioning of each suture prior to final fixation. Prior to placement of lateral row anchor, a suturelasso was used to shuttle a free SutureTape in a margin convergence fashion to better reduce the tear. This was tied arthroscopiclly and one limb was taken into the SwiveLock anchor as well.  Similarly, the anterior strands of each suture and the other limb of the margin convergence suture were passed through another SwiveLock anchor along the anterior margin of the tear.  There was 1 small dogear. The knotless mechanism of the anterior SwiveLock anchor was utilized to reduce the dogear.  This construct allowed for excellent reapproximation of the rotator cuff to its native footprint without undue tension.  Appropriate compression was achieved.  The repair was stable to external and internal rotation.   Fluid was evacuated from the shoulder, and the portals were closed with 3-0 Nylon. Xeroform was applied to the portals. A sterile dressing was applied, followed by a Polar Care sleeve and a SlingShot shoulder immobilizer/sling. The patient was awakened from anesthesia without difficulty and was transferred to the PACU in stable condition.    Of note, assistance from a PA was essential to performing the surgery.  PA was present for the entire surgery.  PA assisted with patient positioning, retraction, instrumentation, and wound closure. The surgery would have been more difficult and had longer operative time without PA assistance.   Additionally, this case had increased complexity compared to standard arthroscopic rotator cuff repair given the involvement of the subscapularis tear. Repair of this tear increased surgical time by 45 minutes and increased complexity due to  additional preparation and repair of subscapularis tear, use of additional implants, and creation of an accessory portal, all of which would have otherwise not occurred.  COMPLICATIONS: none   DISPOSITION: plan for discharge home after recovery in PACU     POSTOPERATIVE PLAN: Remain in sling (except hygiene and elbow/wrist/hand RoM exercises as instructed by PT) x 6 weeks and NWB for this time. PT to begin 3-4 days after surgery.  Large rotator cuff repair rehab protocol with subscapularis restrictions. ASA 325mg  daily x 2 weeks for DVT ppx.

## 2022-09-24 ENCOUNTER — Encounter: Payer: Self-pay | Admitting: Orthopedic Surgery

## 2022-09-24 NOTE — Anesthesia Postprocedure Evaluation (Signed)
Anesthesia Post Note  Patient: BRALLAN DENIO  Procedure(s) Performed: Right shoulder arthroscopic rotator cuff repair, subacromial decompression, distal clavicle excision, and biceps tenodesis (Right: Shoulder)  Patient location during evaluation: PACU Anesthesia Type: Regional Level of consciousness: awake and alert, oriented and patient cooperative Pain management: pain level controlled Vital Signs Assessment: post-procedure vital signs reviewed and stable Respiratory status: spontaneous breathing, nonlabored ventilation and respiratory function stable Cardiovascular status: blood pressure returned to baseline and stable Postop Assessment: adequate PO intake Anesthetic complications: no   No notable events documented.   Last Vitals:  Vitals:   09/23/22 1745 09/23/22 1805  BP: 96/68 97/63  Pulse: (!) 59 65  Resp: 12 14  Temp:  (!) 36.2 C  SpO2: 99% 94%    Last Pain:  Vitals:   09/23/22 1805  TempSrc: Temporal  PainSc: 2                  Darrin Nipper

## 2022-09-30 ENCOUNTER — Ambulatory Visit
Admission: RE | Admit: 2022-09-30 | Discharge: 2022-09-30 | Disposition: A | Discharge status: Home / Self Care | Payer: Medicaid Other | Source: Ambulatory Visit | Attending: Gastroenterology | Admitting: Gastroenterology

## 2022-09-30 DIAGNOSIS — R1319 Other dysphagia: Secondary | ICD-10-CM

## 2022-09-30 DIAGNOSIS — K219 Gastro-esophageal reflux disease without esophagitis: Secondary | ICD-10-CM

## 2022-09-30 DIAGNOSIS — R634 Abnormal weight loss: Secondary | ICD-10-CM

## 2022-11-04 ENCOUNTER — Other Ambulatory Visit: Payer: Medicaid Other

## 2022-12-07 NOTE — H&P (Incomplete)
Pre-Procedure H&P   Patient ID: Brett Fernandez is a 62 y.o. male.  Gastroenterology Provider: Annamaria Helling, DO  Referring Provider: Octavia Bruckner, PA PCP: Lynnell Jude, MD  Date: 12/07/2022  HPI Brett Fernandez is a 62 y.o. male who presents today for Esophagogastroduodenoscopy for Dysphagia, GERD, weight loss .  Patient has noted nonprogressive dysphagia for the last 2 years.  Notes trouble swallowing solids and pills specifically meats and breads.  He does have issues with regurgitation.  He denies any odynophagia or issues with liquids.  He notes breakthrough reflux despite acid suppressive therapy.  He does use tobacco products.  Over the last 6 months he is down approximately 17 pounds and 35 pounds in the last 1.5 years  He is on Coumadin and Brilinta which been held for this procedure***  1997 underwent partial colon resection.  CT May 2023 demonstrating sigmoid diverticulosis with normal hepatobiliary and pancreatic findings  Colonoscopy and EGD in January 2006.  Colonoscopy was normal, EGD demonstrated hiatal hernia gastropathy that was negative for H. pylori on biopsy and otherwise normal  Maternal grandmother with gastric cancer, father with colon polyps. Most recent lab work INR 1.0 creatinine 0.8 hemoglobin 12.6 MCV 97.5 platelets 203,000   Past Medical History:  Diagnosis Date   Acute respiratory failure with hypoxia ([place]) 03/21/2020   after multiple bee stings   Allergy    Anginal pain (HCC)    Bipolar disorder (HCC)    BPH (benign prostatic hypertrophy)    CHF (congestive heart failure) (HCC)    COPD (chronic obstructive pulmonary disease) (HCC)    COPD exacerbation ([place]) 11/02/2015   Coronary artery disease    Deep vein thrombosis (DVT) (HCC)    Depression    DJD (degenerative joint disease)    Gastric ulcer    GERD (gastroesophageal reflux disease)    History of hiatal hernia    Hypertension    Hypothyroidism    Kidney stones     Lethargy 11/02/2015   Myocardial infarction (HCC)    Neuromuscular disorder ([place])    PTSD (post-traumatic stress disorder)     Past Surgical History:  Procedure Laterality Date   APPENDECTOMY     colon polyps     CORONARY STENT INTERVENTION N/A 03/21/2021   Procedure: CORONARY STENT INTERVENTION;  Surgeon: Yolonda Kida, MD;  Location: [facility];  Service: Cardiovascular;  Laterality: N/A;   CORONARY STENT INTERVENTION Left 06/05/2022   Procedure: CORONARY STENT INTERVENTION;  Surgeon: Yolonda Kida, MD;  Location: [facility];  Service: Cardiovascular;  Laterality: Left;   HEMORRHOIDECTOMY WITH HEMORRHOID BANDING     HERNIA REPAIR     INNER EAR SURGERY     JOINT REPLACEMENT Left    knee   LEFT HEART CATH AND CORONARY ANGIOGRAPHY N/A 03/21/2021   Procedure: LEFT HEART CATH AND CORONARY ANGIOGRAPHY;  Surgeon: Yolonda Kida, MD;  Location: [facility];  Service: Cardiovascular;  Laterality: N/A;   RIGHT/LEFT HEART CATH AND CORONARY ANGIOGRAPHY Bilateral 02/25/2022   Procedure: RIGHT/LEFT HEART CATH AND CORONARY ANGIOGRAPHY;  Surgeon: Yolonda Kida, MD;  Location: [facility];  Service: Cardiovascular;  Laterality: Bilateral;   SHOULDER ARTHROSCOPY WITH SUBACROMIAL DECOMPRESSION AND OPEN ROTATOR C Right 09/23/2022   Procedure: Right shoulder arthroscopic rotator cuff repair, subacromial decompression, distal clavicle excision, and biceps tenodesis;  Surgeon: Leim Fabry, MD;  Location: [facility];  Service: Orthopedics;  Laterality: Right;   TOTAL KNEE  ARTHROPLASTY Right 11/01/2015   Procedure: TOTAL KNEE ARTHROPLASTY;  Surgeon: Thornton Park, MD;  Location: [facility];  Service: Orthopedics;  Laterality: Right;    Family History Maternal grandmother with gastric cancer, father with colon polyps No other h/o GI disease or malignancy  Review of Systems  Constitutional:  Positive for unexpected weight change. Negative for activity  change, appetite change, chills, diaphoresis, fatigue and fever.  HENT:  Positive for trouble swallowing. Negative for voice change.   Respiratory:  Negative for shortness of breath and wheezing.   Cardiovascular:  Negative for chest pain, palpitations and leg swelling.  Gastrointestinal:  Negative for abdominal distention, abdominal pain, anal bleeding, blood in stool, constipation, diarrhea, nausea and vomiting.       +gerd  Musculoskeletal:  Negative for arthralgias and myalgias.  Skin:  Negative for color change and pallor.  Neurological:  Negative for dizziness, syncope and weakness.  Psychiatric/Behavioral:  Negative for confusion. The patient is not nervous/anxious.   All other systems reviewed and are negative.    Medications No current facility-administered medications on file prior to encounter.   Current Outpatient Medications on File Prior to Encounter  Medication Sig Dispense Refill   acetaminophen (TYLENOL) 500 MG tablet Take 2 tablets (1,000 mg total) by mouth every 8 (eight) hours. 90 tablet 2   albuterol (PROVENTIL) (2.5 MG/3ML) 0.083% nebulizer solution Take 3 mLs (2.5 mg total) by nebulization every 6 (six) hours as needed for wheezing or shortness of breath. 75 mL 12   albuterol (VENTOLIN HFA) 108 (90 Base) MCG/ACT inhaler Inhale 1-2 puffs into the lungs every 6 (six) hours as needed for wheezing or shortness of breath.     atomoxetine (STRATTERA) 80 MG capsule Take 80 mg by mouth daily.     atorvastatin (LIPITOR) 80 MG tablet Take 80 mg by mouth at bedtime.     b complex vitamins tablet Take 1 tablet by mouth daily.     cyclobenzaprine (FLEXERIL) 5 MG tablet Take 5 mg by mouth at bedtime as needed.     EPINEPHrine 0.3 mg/0.3 mL IJ SOAJ injection Inject 0.3 mLs (0.3 mg total) into the muscle as needed for anaphylaxis. 1 each 0   fluticasone (FLONASE) 50 MCG/ACT nasal spray Place 2 sprays into both nostrils daily.     furosemide (LASIX) 20 MG tablet Take 60 mg by mouth  daily as needed for edema.     isosorbide mononitrate (IMDUR) 60 MG 24 hr tablet Take 60 mg by mouth daily.     levothyroxine (SYNTHROID, LEVOTHROID) 50 MCG tablet Take 50 mcg by mouth daily before breakfast.     lisinopril (ZESTRIL) 5 MG tablet Take 5 mg by mouth daily.     loratadine (CLARITIN) 10 MG tablet Take 10 mg by mouth daily.      meclizine (ANTIVERT) 25 MG tablet Take 25 mg by mouth 3 (three) times daily as needed.     metoprolol succinate (TOPROL-XL) 50 MG 24 hr tablet Take 50 mg by mouth daily. Take with or immediately following a meal.     Multiple Vitamins-Minerals (CENTRUM SILVER 50+MEN PO) Take 1 tablet by mouth daily.     Nebulizer System All-In-One MISC Use as directed for COPD 1 each 0   nitroGLYCERIN (NITROSTAT) 0.4 MG SL tablet Place 1 tablet (0.4 mg total) under the tongue every 5 (five) minutes as needed for chest pain. 30 tablet 2   omeprazole (PRILOSEC) 40 MG capsule Take 40 mg by mouth daily.  ondansetron (ZOFRAN-ODT) 4 MG disintegrating tablet Take 1 tablet (4 mg total) by mouth every 8 (eight) hours as needed for nausea or vomiting. 20 tablet 0   oxyCODONE (ROXICODONE) 5 MG immediate release tablet Take 1-2 tablets (5-10 mg total) by mouth every 4 (four) hours as needed (pain). 30 tablet 0   Oxycodone HCl 10 MG TABS Take 10 mg by mouth 3 (three) times daily as needed (pain).     potassium citrate (UROCIT-K) 10 MEQ (1080 MG) SR tablet Take 20 mEq by mouth 2 (two) times daily.      pregabalin (LYRICA) 200 MG capsule Take 200-400 mg by mouth 2 (two) times daily. 400 mg qam and 200 mg qhs     tamsulosin (FLOMAX) 0.4 MG CAPS capsule Take 0.8 mg by mouth daily.     ticagrelor (BRILINTA) 90 MG TABS tablet Take 1 tablet (90 mg total) by mouth 2 (two) times daily. 60 tablet 6   traZODone (DESYREL) 100 MG tablet Take 200 mg by mouth at bedtime.     warfarin (COUMADIN) 4 MG tablet Take 4 mg by mouth at bedtime.     zinc gluconate 50 MG tablet Take 100 mg by mouth daily.       Pertinent medications related to GI and procedure were reviewed by me with the patient prior to the procedure  No current facility-administered medications for this encounter.  Current Outpatient Medications:    acetaminophen (TYLENOL) 500 MG tablet, Take 2 tablets (1,000 mg total) by mouth every 8 (eight) hours., Disp: 90 tablet, Rfl: 2   albuterol (PROVENTIL) (2.5 MG/3ML) 0.083% nebulizer solution, Take 3 mLs (2.5 mg total) by nebulization every 6 (six) hours as needed for wheezing or shortness of breath., Disp: 75 mL, Rfl: 12   albuterol (VENTOLIN HFA) 108 (90 Base) MCG/ACT inhaler, Inhale 1-2 puffs into the lungs every 6 (six) hours as needed for wheezing or shortness of breath., Disp: , Rfl:    atomoxetine (STRATTERA) 80 MG capsule, Take 80 mg by mouth daily., Disp: , Rfl:    atorvastatin (LIPITOR) 80 MG tablet, Take 80 mg by mouth at bedtime., Disp: , Rfl:    b complex vitamins tablet, Take 1 tablet by mouth daily., Disp: , Rfl:    cyclobenzaprine (FLEXERIL) 5 MG tablet, Take 5 mg by mouth at bedtime as needed., Disp: , Rfl:    EPINEPHrine 0.3 mg/0.3 mL IJ SOAJ injection, Inject 0.3 mLs (0.3 mg total) into the muscle as needed for anaphylaxis., Disp: 1 each, Rfl: 0   fluticasone (FLONASE) 50 MCG/ACT nasal spray, Place 2 sprays into both nostrils daily., Disp: , Rfl:    furosemide (LASIX) 20 MG tablet, Take 60 mg by mouth daily as needed for edema., Disp: , Rfl:    isosorbide mononitrate (IMDUR) 60 MG 24 hr tablet, Take 60 mg by mouth daily., Disp: , Rfl:    levothyroxine (SYNTHROID, LEVOTHROID) 50 MCG tablet, Take 50 mcg by mouth daily before breakfast., Disp: , Rfl:    lisinopril (ZESTRIL) 5 MG tablet, Take 5 mg by mouth daily., Disp: , Rfl:    loratadine (CLARITIN) 10 MG tablet, Take 10 mg by mouth daily. , Disp: , Rfl:    meclizine (ANTIVERT) 25 MG tablet, Take 25 mg by mouth 3 (three) times daily as needed., Disp: , Rfl:    metoprolol succinate (TOPROL-XL) 50 MG 24 hr tablet, Take  50 mg by mouth daily. Take with or immediately following a meal., Disp: , Rfl:    Multiple  Vitamins-Minerals (CENTRUM SILVER 50+MEN PO), Take 1 tablet by mouth daily., Disp: , Rfl:    Nebulizer System All-In-One MISC, Use as directed for COPD, Disp: 1 each, Rfl: 0   nitroGLYCERIN (NITROSTAT) 0.4 MG SL tablet, Place 1 tablet (0.4 mg total) under the tongue every 5 (five) minutes as needed for chest pain., Disp: 30 tablet, Rfl: 2   omeprazole (PRILOSEC) 40 MG capsule, Take 40 mg by mouth daily. , Disp: , Rfl:    ondansetron (ZOFRAN-ODT) 4 MG disintegrating tablet, Take 1 tablet (4 mg total) by mouth every 8 (eight) hours as needed for nausea or vomiting., Disp: 20 tablet, Rfl: 0   oxyCODONE (ROXICODONE) 5 MG immediate release tablet, Take 1-2 tablets (5-10 mg total) by mouth every 4 (four) hours as needed (pain)., Disp: 30 tablet, Rfl: 0   Oxycodone HCl 10 MG TABS, Take 10 mg by mouth 3 (three) times daily as needed (pain)., Disp: , Rfl:    potassium citrate (UROCIT-K) 10 MEQ (1080 MG) SR tablet, Take 20 mEq by mouth 2 (two) times daily. , Disp: , Rfl:    pregabalin (LYRICA) 200 MG capsule, Take 200-400 mg by mouth 2 (two) times daily. 400 mg qam and 200 mg qhs, Disp: , Rfl:    tamsulosin (FLOMAX) 0.4 MG CAPS capsule, Take 0.8 mg by mouth daily., Disp: , Rfl:    ticagrelor (BRILINTA) 90 MG TABS tablet, Take 1 tablet (90 mg total) by mouth 2 (two) times daily., Disp: 60 tablet, Rfl: 6   traZODone (DESYREL) 100 MG tablet, Take 200 mg by mouth at bedtime., Disp: , Rfl:    warfarin (COUMADIN) 4 MG tablet, Take 4 mg by mouth at bedtime., Disp: , Rfl:    zinc gluconate 50 MG tablet, Take 100 mg by mouth daily., Disp: , Rfl:       Allergies  Allergen Reactions   Aspirin Other (See Comments)    Other Reaction: Avie Checo-johnson reaction [name] Syndrome   Bee Venom Anaphylaxis   Sulfa Antibiotics Swelling    Other reaction(s): Unknown   Allergies were reviewed by me prior to the  procedure  Objective   There is no height or weight on file to calculate BMI. There were no vitals filed for this visit.  *** Physical Exam Vitals and nursing note reviewed.  Constitutional:      General: He is not in acute distress.    Appearance: Normal appearance. He is not ill-appearing, toxic-appearing or diaphoretic.  HENT:     Head: Normocephalic and atraumatic.     Nose: Nose normal.     Mouth/Throat:     Mouth: Mucous membranes are moist.     Pharynx: Oropharynx is clear.  Eyes:     General: No scleral icterus.    Extraocular Movements: Extraocular movements intact.  Cardiovascular:     Rate and Rhythm: Normal rate and regular rhythm.     Heart sounds: Normal heart sounds. No murmur heard.    No friction rub. No gallop.  Pulmonary:     Effort: Pulmonary effort is normal. No respiratory distress.     Breath sounds: Normal breath sounds. No wheezing, rhonchi or rales.  Abdominal:     General: Bowel sounds are normal. There is no distension.     Palpations: Abdomen is soft.     Tenderness: There is no abdominal tenderness. There is no guarding or rebound.  Musculoskeletal:     Cervical back: Neck supple.     Right lower leg: No edema.  Left lower leg: No edema.  Skin:    General: Skin is warm and dry.     Coloration: Skin is not jaundiced or pale.  Neurological:     General: No focal deficit present.     Mental Status: He is alert and oriented to person, place, and time. Mental status is at baseline.  Psychiatric:        Mood and Affect: Mood normal.        Behavior: Behavior normal.        Thought Content: Thought content normal.        Judgment: Judgment normal.      Assessment:  Brett Fernandez is a 62 y.o. male  who presents today for Esophagogastroduodenoscopy for Dysphagia, GERD, weight loss .  Plan:  Esophagogastroduodenoscopy with possible intervention today  Esophagogastroduodenoscopy with possible biopsy, control of bleeding,  polypectomy, and interventions as necessary has been discussed with the patient/patient representative. Informed consent was obtained from the patient/patient representative after explaining the indication, nature, and risks of the procedure including but not limited to death, bleeding, perforation, missed neoplasm/lesions, cardiorespiratory compromise, and reaction to medications. Opportunity for questions was given and appropriate answers were provided. Patient/patient representative has verbalized understanding is amenable to undergoing the procedure.   Annamaria Helling, DO  [facility]  Portions of the record may have been created with voice recognition software. Occasional wrong-word or 'sound-a-like' substitutions may have occurred due to the inherent limitations of voice recognition software.  Read the chart carefully and recognize, using context, where substitutions may have occurred.

## 2022-12-08 ENCOUNTER — Encounter: Admission: RE | Payer: Self-pay | Source: Home / Self Care

## 2022-12-08 ENCOUNTER — Ambulatory Visit: Admission: RE | Admit: 2022-12-08 | Payer: Medicaid Other | Source: Home / Self Care | Admitting: Gastroenterology

## 2022-12-08 SURGERY — EGD (ESOPHAGOGASTRODUODENOSCOPY)
Anesthesia: General

## 2023-01-05 ENCOUNTER — Inpatient Hospital Stay: Admission: RE | Admit: 2023-01-05 | Payer: Medicaid Other | Source: Ambulatory Visit

## 2023-01-08 IMAGING — CR DG CHEST 2V
2 series · 2 of 2 positions shown · non-contrast
Comparison: 03/21/2020

CLINICAL DATA: Left mid chest pain.

EXAM:
CHEST - 2 VIEW

[chest pa]
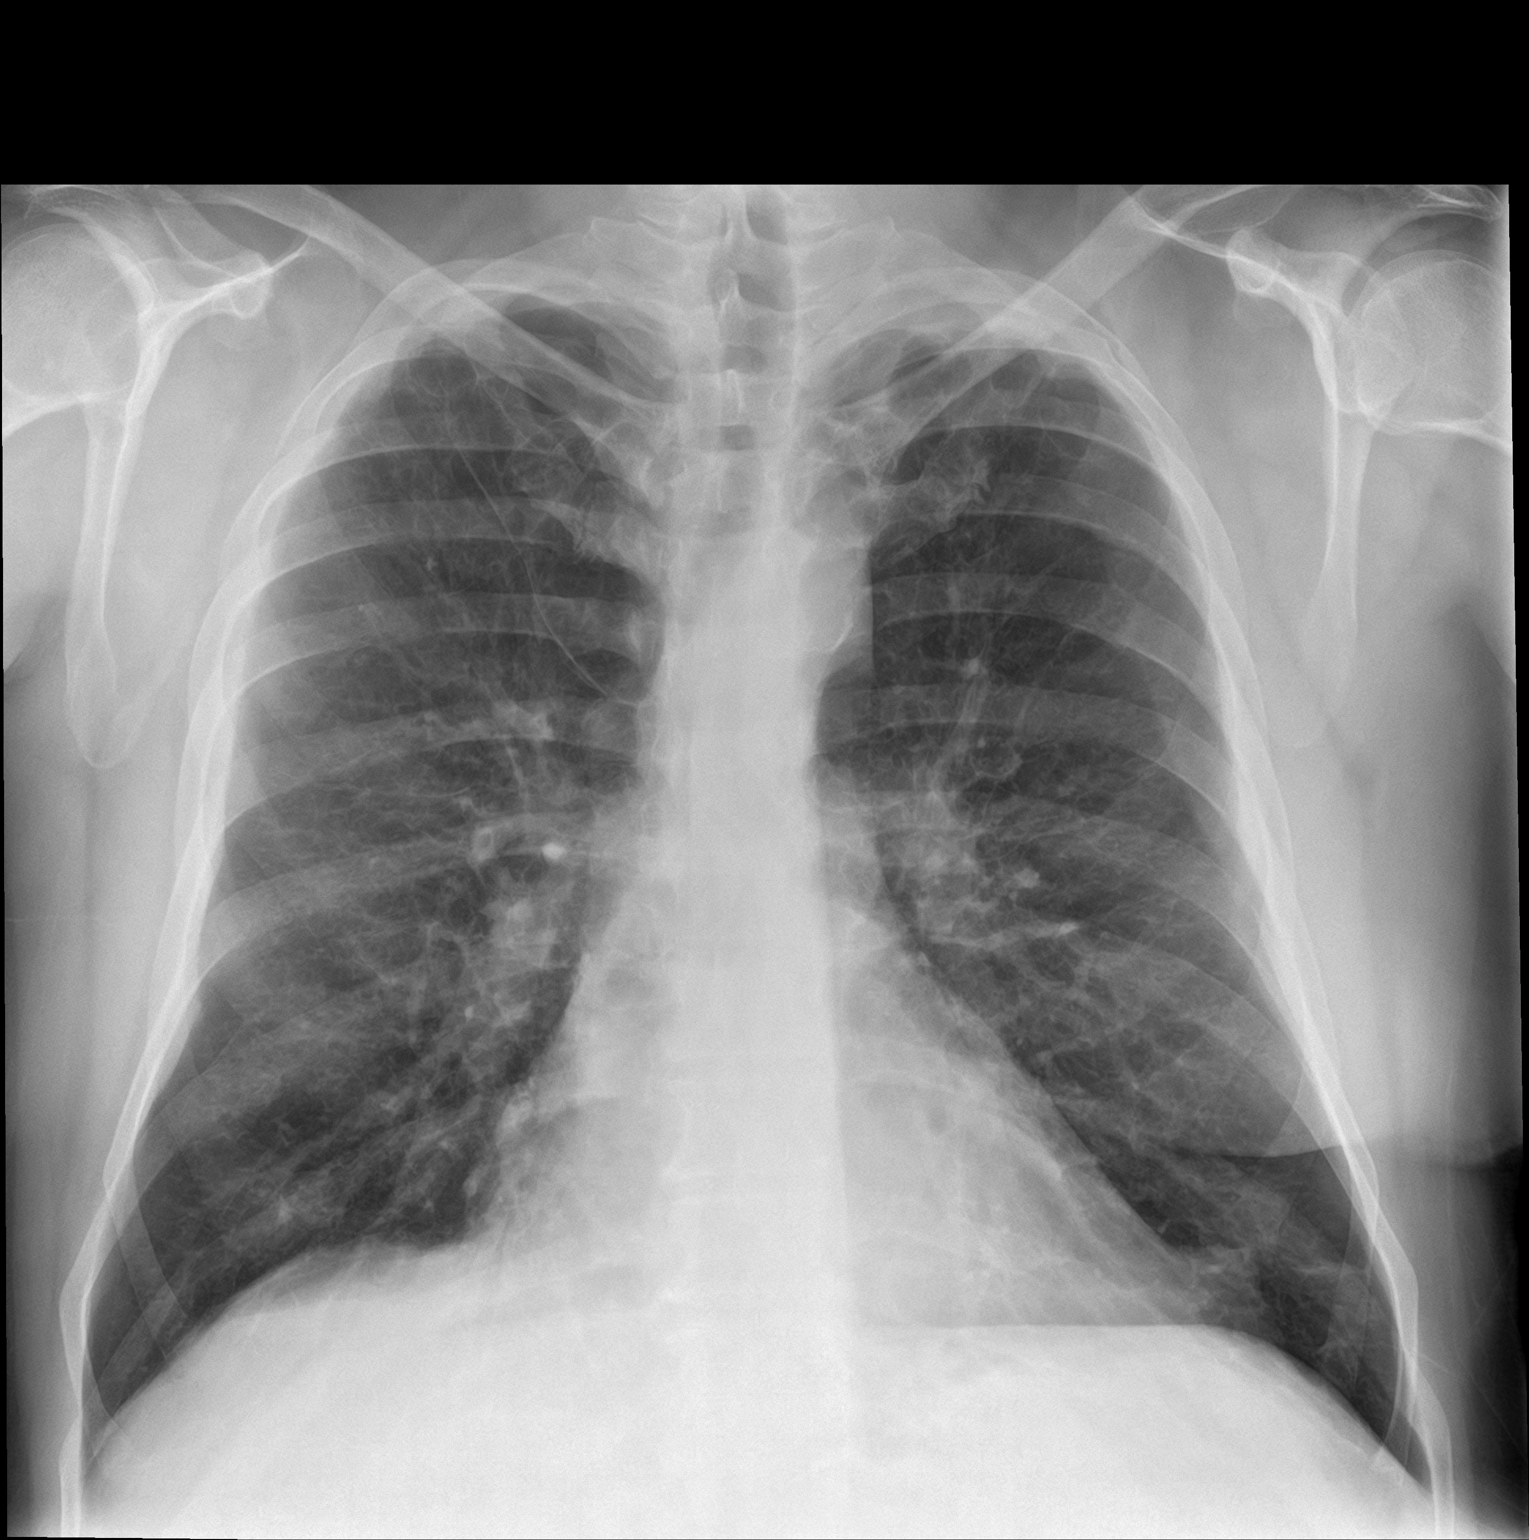

[chest lat]
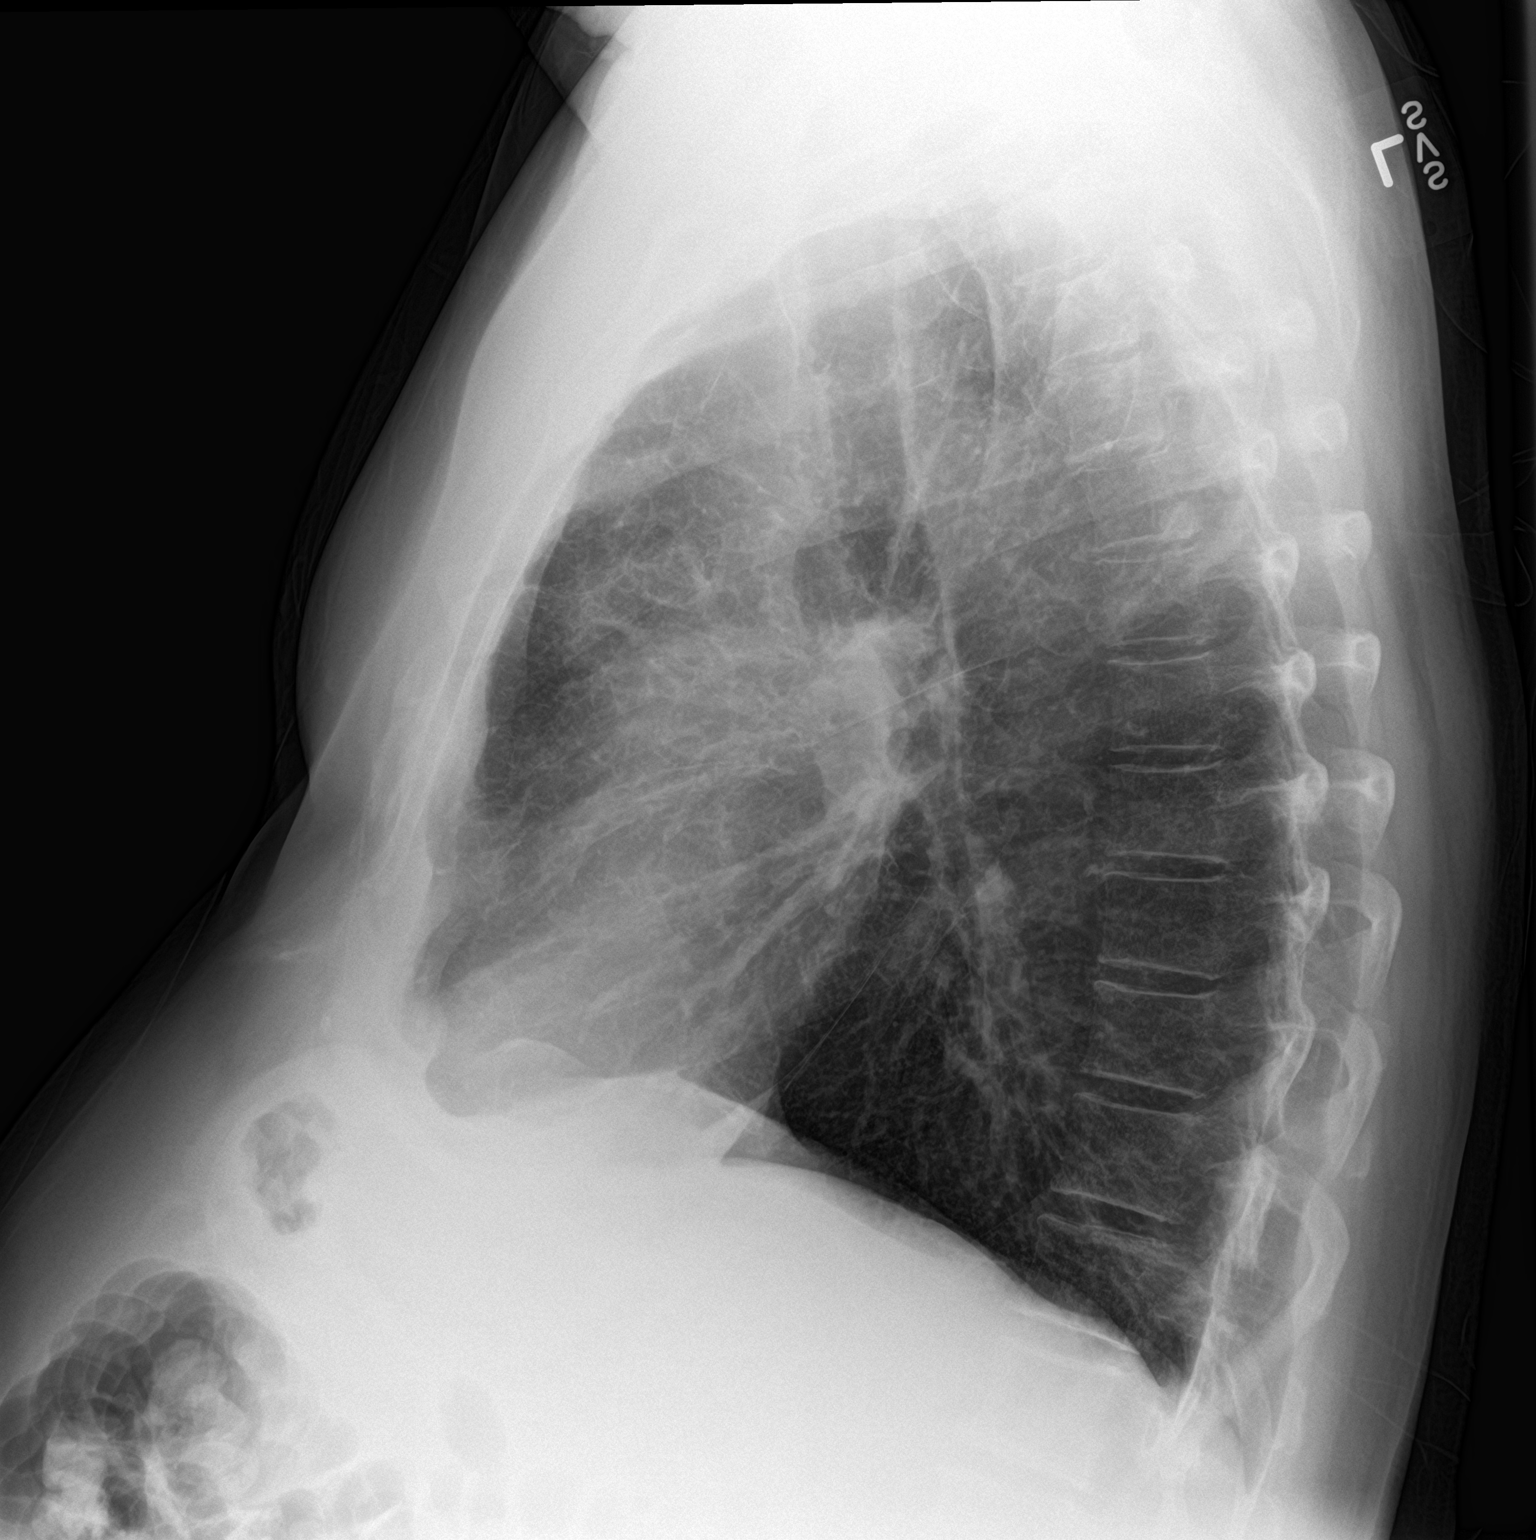

[2 of 2 positions shown; findings below may reference images not displayed]

FINDINGS: Lungs are hyperexpanded with bullous changes in the apices.
Atelectasis or scarring noted left base. Interstitial markings are
diffusely coarsened with chronic features. The lungs are clear
without focal pneumonia, edema, pneumothorax or pleural effusion.
Tiny pulmonary nodule noted at the right base. The visualized bony
structures of the thorax show no acute abnormality.
IMPRESSION: Tiny pulmonary nodule noted at the right lung base. CT chest without
contrast recommended to further evaluate.

Hyperexpansion with chronic interstitial coarsening.

## 2023-01-11 ENCOUNTER — Other Ambulatory Visit: Payer: Self-pay | Admitting: Acute Care

## 2023-01-11 DIAGNOSIS — F17210 Nicotine dependence, cigarettes, uncomplicated: Secondary | ICD-10-CM

## 2023-01-11 DIAGNOSIS — Z87891 Personal history of nicotine dependence: Secondary | ICD-10-CM

## 2023-01-11 DIAGNOSIS — Z122 Encounter for screening for malignant neoplasm of respiratory organs: Secondary | ICD-10-CM

## 2023-02-02 ENCOUNTER — Ambulatory Visit
Admission: RE | Admit: 2023-02-02 | Discharge: 2023-02-02 | Disposition: A | Discharge status: Home / Self Care | Payer: Medicaid Other | Source: Ambulatory Visit | Attending: Acute Care | Admitting: Acute Care

## 2023-02-02 DIAGNOSIS — Z87891 Personal history of nicotine dependence: Secondary | ICD-10-CM | POA: Diagnosis present

## 2023-02-02 DIAGNOSIS — J439 Emphysema, unspecified: Secondary | ICD-10-CM | POA: Insufficient documentation

## 2023-02-02 DIAGNOSIS — I700 Atherosclerosis of aorta: Secondary | ICD-10-CM | POA: Diagnosis not present

## 2023-02-02 DIAGNOSIS — F17210 Nicotine dependence, cigarettes, uncomplicated: Secondary | ICD-10-CM

## 2023-02-02 DIAGNOSIS — I2510 Atherosclerotic heart disease of native coronary artery without angina pectoris: Secondary | ICD-10-CM | POA: Diagnosis not present

## 2023-02-02 DIAGNOSIS — N200 Calculus of kidney: Secondary | ICD-10-CM | POA: Diagnosis not present

## 2023-02-02 DIAGNOSIS — Z122 Encounter for screening for malignant neoplasm of respiratory organs: Secondary | ICD-10-CM

## 2023-02-05 ENCOUNTER — Other Ambulatory Visit: Payer: Self-pay | Admitting: Acute Care

## 2023-02-05 DIAGNOSIS — F17210 Nicotine dependence, cigarettes, uncomplicated: Secondary | ICD-10-CM

## 2023-02-05 DIAGNOSIS — Z122 Encounter for screening for malignant neoplasm of respiratory organs: Secondary | ICD-10-CM

## 2023-02-05 DIAGNOSIS — Z87891 Personal history of nicotine dependence: Secondary | ICD-10-CM

## 2023-05-20 ENCOUNTER — Other Ambulatory Visit: Payer: Self-pay | Admitting: Gastroenterology

## 2023-05-20 DIAGNOSIS — K219 Gastro-esophageal reflux disease without esophagitis: Secondary | ICD-10-CM

## 2023-05-20 DIAGNOSIS — R634 Abnormal weight loss: Secondary | ICD-10-CM

## 2023-05-20 DIAGNOSIS — R1319 Other dysphagia: Secondary | ICD-10-CM

## 2023-05-22 ENCOUNTER — Ambulatory Visit
Admission: RE | Admit: 2023-05-22 | Discharge: 2023-05-22 | Disposition: A | Discharge status: Home / Self Care | Payer: Medicaid Other | Source: Ambulatory Visit | Attending: Gastroenterology | Admitting: Gastroenterology

## 2023-05-22 DIAGNOSIS — R634 Abnormal weight loss: Secondary | ICD-10-CM | POA: Diagnosis present

## 2023-05-22 DIAGNOSIS — R1319 Other dysphagia: Secondary | ICD-10-CM | POA: Diagnosis present

## 2023-05-22 DIAGNOSIS — K219 Gastro-esophageal reflux disease without esophagitis: Secondary | ICD-10-CM | POA: Diagnosis present

## 2023-05-25 NOTE — Group Note (Deleted)

## 2023-08-31 ENCOUNTER — Other Ambulatory Visit: Payer: Self-pay | Admitting: Family Medicine

## 2023-08-31 DIAGNOSIS — G459 Transient cerebral ischemic attack, unspecified: Secondary | ICD-10-CM

## 2023-09-07 ENCOUNTER — Ambulatory Visit
Admission: RE | Admit: 2023-09-07 | Discharge: 2023-09-07 | Disposition: A | Discharge status: Home / Self Care | Payer: Medicaid Other | Source: Ambulatory Visit | Attending: Family Medicine | Admitting: Family Medicine

## 2023-09-07 DIAGNOSIS — G459 Transient cerebral ischemic attack, unspecified: Secondary | ICD-10-CM | POA: Diagnosis present

## 2023-09-10 ENCOUNTER — Emergency Department
Admission: EM | Admit: 2023-09-10 | Discharge: 2023-09-10 | Disposition: A | Discharge status: Home / Self Care | Payer: Medicaid Other | Attending: Emergency Medicine | Admitting: Emergency Medicine

## 2023-09-10 ENCOUNTER — Other Ambulatory Visit: Payer: Self-pay

## 2023-09-10 ENCOUNTER — Emergency Department: Payer: Medicaid Other

## 2023-09-10 DIAGNOSIS — M79605 Pain in left leg: Secondary | ICD-10-CM

## 2023-09-10 DIAGNOSIS — I2510 Atherosclerotic heart disease of native coronary artery without angina pectoris: Secondary | ICD-10-CM | POA: Diagnosis not present

## 2023-09-10 DIAGNOSIS — Z7901 Long term (current) use of anticoagulants: Secondary | ICD-10-CM | POA: Insufficient documentation

## 2023-09-10 DIAGNOSIS — M7981 Nontraumatic hematoma of soft tissue: Secondary | ICD-10-CM | POA: Diagnosis not present

## 2023-09-10 DIAGNOSIS — S8012XA Contusion of left lower leg, initial encounter: Secondary | ICD-10-CM

## 2023-09-10 DIAGNOSIS — I110 Hypertensive heart disease with heart failure: Secondary | ICD-10-CM | POA: Insufficient documentation

## 2023-09-10 DIAGNOSIS — I509 Heart failure, unspecified: Secondary | ICD-10-CM | POA: Diagnosis not present

## 2023-09-10 NOTE — ED Provider Triage Note (Signed)
Emergency Medicine Provider Triage Evaluation Note  Brett Fernandez , a 62 y.o. male  was evaluated in triage.  Pt complains of pain and swelling in left leg x 2 days. Currently on warfarin.   Physical Exam  Pulse 71   Temp 97.6 F (36.4 C)   Resp 18  Gen:   Awake, no distress   Resp:  Normal effort  MSK:   Moves extremities without difficulty  Other:    Medical Decision Making  Medically screening exam initiated at 6:26 PM.  Appropriate orders placed.  Brett Fernandez was informed that the remainder of the evaluation will be completed by another provider, this initial triage assessment does not replace that evaluation, and the importance of remaining in the ED until their evaluation is complete.    Chinita Pester, FNP 09/10/23 [phone]

## 2023-09-10 NOTE — Discharge Instructions (Signed)
Your exam and [place] are normal and reassuring. No evidence of a an DVT on your study. Take your home meds as prescribed. Follow-up with Dr. Quillian Quince as needed.

## 2023-09-10 NOTE — ED Notes (Signed)
 Patient discharged from ED by provider. Discharge instructions reviewed with patient and all questions answered. Patient wheeled from ED in NAD.

## 2023-09-10 NOTE — ED Provider Notes (Signed)
[facility] Emergency Department Provider Note     Event Date/Time   First MD Initiated Contact with Patient 09/10/23 2048     (approximate)   History   No chief complaint on file.   HPI  Brett Fernandez is a 62 y.o. male with a history of HTN, CHF, CAD on blood thinner, DVT history, and MI, presents to the ED for left lower extremity fullness and pain.  Patient would endorse 4 days of swelling and 1 day of pain to the posterior left leg.  He presents to the ED for evaluation of pain and swelling with concern for possible DVT.  He denies any missed doses of his Coumadin.  No chest pain or shortness of breath reported.  No recent injury or trauma reported.   Physical Exam   Triage Vital Signs: ED Triage Vitals  Encounter Vitals Group     BP 09/10/23 1827 134/87     Systolic BP Percentile --      Diastolic BP Percentile --      Pulse Rate 09/10/23 1826 71     Resp 09/10/23 1826 18     Temp 09/10/23 1826 97.6 F (36.4 C)     Temp src --      SpO2 09/10/23 1827 97 %     Weight 09/10/23 1826 160 lb (72.6 kg)     Height 09/10/23 1826 5\' 6"  (1.676 m)     Head Circumference --      Peak Flow --      Pain Score 09/10/23 1825 8     Pain Loc --      Pain Education --      Exclude from Growth Chart --     Most recent vital signs: Vitals:   09/10/23 1826 09/10/23 1827  BP:  134/87  Pulse: 71   Resp: 18   Temp: 97.6 F (36.4 C)   SpO2:  97%    General Awake, no distress. NAD CV:  Good peripheral perfusion. RRR RESP:  Normal effort.  ABD:  No distention.  MSK:  LLE no obvious deformity, dislocation, ecchymosis, or edema.  Active range of motion of the knee and ankle joint.  Mild tender to palpation to posterior mid calf region.  No obvious bruise or hematoma appreciated.  No calf cords noted.   ED Results / Procedures / Treatments   Labs (all labs ordered are listed, but only abnormal results are displayed) Labs Reviewed - No data to  display   EKG    RADIOLOGY  I personally viewed and evaluated these images as part of my medical decision making, as well as reviewing the written report by the radiologist.  ED Provider Interpretation: No acute DVT; popliteal space fullness considered consistent with Baker's cyst; lateral soft tissue swelling consistent with hematoma  US Venous Img Lower Unilateral Left Result Date: 09/10/2023 CLINICAL DATA:  pain and swelling for 4 days. Tobacco use. Prior DVT/PE. Color changes and ulcerations. Varicose veins. Anticoagulation therapy. EXAM: Left LOWER EXTREMITY VENOUS DOPPLER ULTRASOUND TECHNIQUE: Gray-scale sonography with compression, as well as color and duplex ultrasound, were performed to evaluate the deep venous system(s) from the level of the common femoral vein through the popliteal and proximal calf veins. COMPARISON:  Ultrasound left lower extremity 02/17/2022 FINDINGS: VENOUS Normal compressibility of the common femoral, superficial femoral, and popliteal veins, as well as the visualized calf veins. Visualized portions of profunda femoral vein and great saphenous vein unremarkable. No filling defects  to suggest DVT on grayscale or color Doppler imaging. Doppler waveforms show normal direction of venous flow, normal respiratory plasticity and response to augmentation. Limited views of the contralateral common femoral vein are unremarkable. OTHER There is a 3.3 x 0.7 x 1.7 cm simple appearing fluid collection along the left popliteal fossa. A 5.2 x 1.3 x 2.1 cm complex appearing fluid collection along the calf at the area of pain/swelling the could represents a hematoma. Limitations: none IMPRESSION: 1. No deep venous thrombosis of the left lower extremity. 2. A 3.3 x 0.7 x 1.7 cm simple appearing left popliteal fossa fluid collection that could represent a Baker's cyst versus liquified hematoma. 3. A 5.2 x 1.3 x 2.1 cm complex appearing fluid collection along the calf at the area of  pain/swelling could represent a hematoma. Recommend follow-up until resolution. Electronically Signed   By: Tish Frederickson M.D.   On: 09/10/2023 19:30     PROCEDURES:  Critical Care performed: No  Procedures   MEDICATIONS ORDERED IN ED: Medications - No data to display   IMPRESSION / MDM / ASSESSMENT AND PLAN / ED COURSE  I reviewed the triage vital signs and the nursing notes.                              Differential diagnosis includes, but is not limited to, DVT, muscle strain, gastroc tear, Baker's cyst, hematoma, myalgia  Patient's presentation is most consistent with acute complicated illness / injury requiring diagnostic workup.  Patient's diagnosis is consistent with hematoma to the LLE.  Reassuring exam and workup at this time.  No ultrasound evidence of acute DVT, based on interpretation of images.  Patient will be discharged home with instructions to apply moist heat to help reduce symptoms.  They also take his previously prescribed home meds as directed. Patient is to follow up with PCP as discussed, as needed or otherwise directed. Patient is given ED precautions to return to the ED for any worsening or new symptoms.   FINAL CLINICAL IMPRESSION(S) / ED DIAGNOSES   Final diagnoses:  Leg pain, posterior, left  Hematoma of left lower leg     Rx / DC Orders   ED Discharge Orders     None        Note:  This document was prepared using Dragon voice recognition software and may include unintentional dictation errors.    Lissa Hoard, PA-C 09/10/23 2114    Dionne Bucy, MD 09/10/23 2350

## 2023-09-10 NOTE — ED Triage Notes (Signed)
Pt to ED for posterior left leg pain, swelling started yesterday.  Takes blood thinners

## 2023-09-30 ENCOUNTER — Other Ambulatory Visit: Payer: Self-pay | Admitting: Family Medicine

## 2023-09-30 DIAGNOSIS — S8012XA Contusion of left lower leg, initial encounter: Secondary | ICD-10-CM

## 2023-09-30 DIAGNOSIS — M7122 Synovial cyst of popliteal space [Baker], left knee: Secondary | ICD-10-CM

## 2023-10-01 ENCOUNTER — Ambulatory Visit
Admission: RE | Admit: 2023-10-01 | Discharge: 2023-10-01 | Disposition: A | Discharge status: Home / Self Care | Payer: Medicaid Other | Source: Ambulatory Visit | Attending: Family Medicine | Admitting: Family Medicine

## 2023-10-01 DIAGNOSIS — M7122 Synovial cyst of popliteal space [Baker], left knee: Secondary | ICD-10-CM | POA: Diagnosis present

## 2023-10-01 DIAGNOSIS — S8012XA Contusion of left lower leg, initial encounter: Secondary | ICD-10-CM | POA: Diagnosis present

## 2023-10-03 ENCOUNTER — Emergency Department
Admission: EM | Admit: 2023-10-03 | Discharge: 2023-10-03 | Disposition: A | Discharge status: Home / Self Care | Payer: Medicaid Other | Attending: Emergency Medicine | Admitting: Emergency Medicine

## 2023-10-03 ENCOUNTER — Emergency Department: Payer: Medicaid Other

## 2023-10-03 ENCOUNTER — Other Ambulatory Visit: Payer: Self-pay

## 2023-10-03 DIAGNOSIS — S8012XD Contusion of left lower leg, subsequent encounter: Secondary | ICD-10-CM | POA: Insufficient documentation

## 2023-10-03 DIAGNOSIS — Z7901 Long term (current) use of anticoagulants: Secondary | ICD-10-CM | POA: Insufficient documentation

## 2023-10-03 DIAGNOSIS — J449 Chronic obstructive pulmonary disease, unspecified: Secondary | ICD-10-CM | POA: Insufficient documentation

## 2023-10-03 DIAGNOSIS — S8992XD Unspecified injury of left lower leg, subsequent encounter: Secondary | ICD-10-CM | POA: Diagnosis present

## 2023-10-03 DIAGNOSIS — I509 Heart failure, unspecified: Secondary | ICD-10-CM | POA: Insufficient documentation

## 2023-10-03 DIAGNOSIS — E039 Hypothyroidism, unspecified: Secondary | ICD-10-CM | POA: Insufficient documentation

## 2023-10-03 DIAGNOSIS — I110 Hypertensive heart disease with heart failure: Secondary | ICD-10-CM | POA: Diagnosis not present

## 2023-10-03 DIAGNOSIS — X58XXXD Exposure to other specified factors, subsequent encounter: Secondary | ICD-10-CM | POA: Diagnosis not present

## 2023-10-03 DIAGNOSIS — I2510 Atherosclerotic heart disease of native coronary artery without angina pectoris: Secondary | ICD-10-CM | POA: Insufficient documentation

## 2023-10-03 LAB — CBC WITH DIFFERENTIAL/PLATELET
Abs Immature Granulocytes: 0.07 10*3/uL (ref 0.00–0.07)
Basophils Absolute: 0.1 10*3/uL (ref 0.0–0.1)
Basophils Relative: 1 %
Eosinophils Absolute: 0.4 10*3/uL (ref 0.0–0.5)
Eosinophils Relative: 3 %
HCT: 40.2 % (ref 39.0–52.0)
Hemoglobin: 13 g/dL (ref 13.0–17.0)
Immature Granulocytes: 1 %
Lymphocytes Relative: 16 %
Lymphs Abs: 2.1 10*3/uL (ref 0.7–4.0)
MCH: 32.6 pg (ref 26.0–34.0)
MCHC: 32.3 g/dL (ref 30.0–36.0)
MCV: 100.8 fL — ABNORMAL HIGH (ref 80.0–100.0)
Monocytes Absolute: 1.1 10*3/uL — ABNORMAL HIGH (ref 0.1–1.0)
Monocytes Relative: 8 %
Neutro Abs: 10.1 10*3/uL — ABNORMAL HIGH (ref 1.7–7.7)
Neutrophils Relative %: 71 %
Platelets: 299 10*3/uL (ref 150–400)
RBC: 3.99 MIL/uL — ABNORMAL LOW (ref 4.22–5.81)
RDW: 13.2 % (ref 11.5–15.5)
WBC: 13.8 10*3/uL — ABNORMAL HIGH (ref 4.0–10.5)
nRBC: 0 % (ref 0.0–0.2)

## 2023-10-03 LAB — PROTIME-INR
INR: 2.2 — ABNORMAL HIGH (ref 0.8–1.2)
Prothrombin Time: 24.2 s — ABNORMAL HIGH (ref 11.4–15.2)

## 2023-10-03 LAB — BASIC METABOLIC PANEL
Anion gap: 8 (ref 5–15)
BUN: 20 mg/dL (ref 8–23)
CO2: 27 mmol/L (ref 22–32)
Calcium: 9 mg/dL (ref 8.9–10.3)
Chloride: 99 mmol/L (ref 98–111)
Creatinine, Ser: 1.27 mg/dL — ABNORMAL HIGH (ref 0.61–1.24)
GFR, Estimated: >60 mL/min (ref >60)
Glucose, Bld: 111 mg/dL — ABNORMAL HIGH (ref 70–99)
Potassium: 4.4 mmol/L (ref 3.5–5.1)
Sodium: 134 mmol/L — ABNORMAL LOW (ref 135–145)

## 2023-10-03 LAB — CK: Total CK: 113 U/L (ref 49–397)

## 2023-10-03 MED ORDER — IOHEXOL 350 MG/ML SOLN
100.0000 mL | Freq: Once | INTRAVENOUS | Status: AC | PRN
  Administered 2023-10-03: 100 mL via INTRAVENOUS

## 2023-10-03 MED ORDER — MORPHINE SULFATE (PF) 4 MG/ML IV SOLN
4.0000 mg | Freq: Once | INTRAVENOUS | Status: AC
  Administered 2023-10-03: 4 mg via INTRAVENOUS
  Filled 2023-10-03: qty 1

## 2023-10-03 MED ORDER — OXYCODONE-ACETAMINOPHEN 10-325 MG PO TABS: 1.0000 | ORAL_TABLET | Freq: Three times a day (TID) | ORAL | 0 refills | Status: AC | PRN

## 2023-10-03 NOTE — ED Provider Notes (Signed)
[facility] Provider Note    Event Date/Time   First MD Initiated Contact with Patient 10/03/23 1511     (approximate)   History   compartment syndrome and calf tightness w hematoma   HPI  Brett Fernandez is a 63 y.o. 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  The patient denies any initial trauma to the leg.  He states that the calf started to swell, become tender and painful on its own.  He was seen in the ED shortly after Christmas and had an ultrasound showing both a Baker's cyst and a hematoma.  He states the pain and swelling was starting get slightly better however about 3 days ago it started to get worse, again without any trauma.  He now has numbness in the foot.  He had a repeat ultrasound done 2 days ago that showed a somewhat expanding hematoma.  I reviewed the past medical records per the patient was most recently admitted to inpatient in September 2023 for cardiac catheterization.  I also reviewed the ultrasound reports from the current episode of leg swelling.  The ultrasound on 12/26 showed a 5 x 1 x 2 cm complex fluid collection likely representing hematoma.  The ultrasound from 1/16 measured it as 14 x 5 x 2 cm.   Physical Exam   Triage Vital Signs: ED Triage Vitals  Encounter Vitals Group     BP 10/03/23 1437 120/75     Systolic BP Percentile --      Diastolic BP Percentile --      Pulse Rate 10/03/23 1437 75     Resp 10/03/23 1437 20     Temp 10/03/23 1437 98 F (36.7 C)     Temp Source 10/03/23 1437 Oral     SpO2 10/03/23 1437 99 %     Weight 10/03/23 1437 160 lb 15 oz (73 kg)     Height 10/03/23 1437 5\' 6"  (1.676 m)     Head Circumference --      Peak Flow --      Pain Score 10/03/23 1438 8     Pain Loc --      Pain Education --      Exclude from  Growth Chart --     Most recent vital signs: Vitals:   10/03/23 1800 10/03/23 1830  BP: 108/76 122/72  Pulse: 68 68  Resp: 18 17  Temp: 98 F (36.7 C)   SpO2: 99% 98%     General: Awake, no distress.  CV:  Good peripheral perfusion.  Resp:  Normal effort.  Abd:  No distention.  Other:  1+ bilateral DP pulses.  Cap refill to left foot around 2 seconds.  Left foot slightly cooler than right.  Color symmetrical bilaterally.  Pain on range of motion of the ankle, however patient is able to plantar and dorsiflex and move all his toes.  Subjective decrease sensation to the foot and toes.  Swelling and tenderness to the posterior calf, however skin does not appear tense and is relatively soft to palpation.   ED Results / Procedures / Treatments   Labs (all labs ordered are listed, but only abnormal results are displayed) Labs Reviewed  PROTIME-INR - Abnormal; Notable for the following components:      Result Value   Prothrombin  Time 24.2 (*)    INR 2.2 (*)    All other components within normal limits  CBC WITH DIFFERENTIAL/PLATELET - Abnormal; Notable for the following components:   WBC 13.8 (*)    RBC 3.99 (*)    MCV 100.8 (*)    Neutro Abs 10.1 (*)    Monocytes Absolute 1.1 (*)    All other components within normal limits  BASIC METABOLIC PANEL - Abnormal; Notable for the following components:   Sodium 134 (*)    Glucose, Bld 111 (*)    Creatinine, Ser 1.27 (*)    All other components within normal limits  CK     EKG     RADIOLOGY  CT angio lower extremity:   IMPRESSION:  VASCULAR    1. No significant flow limiting stenosis within the aorta or  bilateral lower extremity runoff vessels.  2. Complex fluid collection along the superficial margin of the  medial head of the gastrocnemius muscle, with imaging  characteristics most compatible with hematoma. No evidence of  contrast extravasation to suggest active hemorrhage.  3.  Aortic Atherosclerosis  (ICD10-I70.0).    NON-VASCULAR    1. Complex fluid collection along the superficial margin of the  medial head of the gastrocnemius muscle, most consistent with  hematoma as described above. Adjacent subcutaneous edema within the  left popliteal fossa and posterior calf.  2. Nonspecific early opacification of the superficial veins of the  left calf , which may reflect hypervascularity of the soft tissues  surrounding the complex fluid collection. No CT evidence of AV  fistula.  3. Large left knee effusion.  4. Multiple punctate less than 3 mm nonobstructing left renal  calculi.    PROCEDURES:  Critical Care performed: No  Procedures   MEDICATIONS ORDERED IN ED: Medications  iohexol (OMNIPAQUE) 350 MG/ML injection 100 mL (100 mLs Intravenous Contrast Given 10/03/23 1548)  morphine (PF) 4 MG/ML injection 4 mg (4 mg Intravenous Given 10/03/23 1627)     IMPRESSION / MDM / ASSESSMENT AND PLAN / ED COURSE  I reviewed the triage vital signs and the nursing notes.  63 year old male with PMH as noted above presents with worsened left lower leg swelling over the last 3 weeks, atraumatic, initially coming in for evaluation on 12/26.  The patient had increased swelling and pain about 3 days ago and ultrasound from that time shows an expanding hematoma.  Triage RN noted inability to palpate DP pulse, however it was easily palpable on my exam and symmetric to the right side.  Exam is overall quite reassuring.  The patient's vital signs are normal.  He has some pain with movement but is not acutely uncomfortable appearing, and the calf itself is tender and swollen but not tense or hard.  He does have slightly delayed cap refill and does report numbness on that side.  Differential diagnosis includes, but is not limited to, acute arterial hemorrhage, expanding hematoma, Baker's cyst, neuropraxia.  I have a low suspicion for acute arterial occlusion given the palpable pulses and otherwise reassuring  exam.  Clinical presentation is also not consistent with compartment syndrome, and the history also does not along with this diagnosis.  We will obtain labs, CT angio, and reassess.  Patient's presentation is most consistent with acute presentation with potential threat to life or bodily function.  The patient is on the cardiac monitor to evaluate for evidence of arrhythmia and/or significant heart rate changes.  ----------------------------------------- 6:49 PM on 10/03/2023 -----------------------------------------  CT angio  confirms a diagnosis of gastrocnemius tear hematoma with no evidence of active extravasation.  There is no evidence of any arterial occlusion or other acute vascular issue.  Lab workup is unremarkable.  CK is normal.  CBC shows mild leukocytosis.  I consulted Dr. Hyacinth Meeker from orthopedics and discussed the case and findings with him.  He recommends placing a posterior splint and having the patient completely nonweightbearing, using crutches, and doing bed rest as much as possible over the next several days, as well as elevating the leg on 3 pillows.  He recommends that the patient pause his Coumadin for the next 2 days.  He advised that the patient can follow-up with his primary care provider early next week.  I counseled the patient on the results of the workup, orthopedic recommendations, and overall plan.  He expressed understanding and agreement.  At this time, his pain is well-controlled and he feels comfortable going home.  He states that he lives by himself but has family members that we will be able to help him.  The splint has been placed.  At this time the patient is stable for discharge.  I gave strict return precautions and he expressed understanding.  FINAL CLINICAL IMPRESSION(S) / ED DIAGNOSES   Final diagnoses:  Hematoma of left lower extremity, subsequent encounter     Rx / DC Orders   ED Discharge Orders          Ordered    oxyCODONE-acetaminophen  (PERCOCET) 10-325 MG tablet  3 times daily PRN        10/03/23 1846             Note:  This document was prepared using Dragon voice recognition software and may include unintentional dictation errors.    Dionne Bucy, MD 10/03/23 [phone]

## 2023-10-03 NOTE — Discharge Instructions (Addendum)
Your CT scan today is showing the same result as the ultrasounds, which is a hematoma.  This is a collection of blood.  There are no signs that it is actively bleeding right now.  Keep the splint on at all times.  You should go on bedrest for the next 3 to 5 days, only getting up to go to the bathroom or when otherwise absolutely necessary.  You should keep the left leg elevated on several pillows.  As discussed, you should hold the Coumadin tonight and tomorrow.  You may resume it on Monday.  We have prescribed a small quantity of the 10 mg oxycodone that you have been prescribed previously to cover your pain for the next several days.  Follow-up with your primary care doctor as soon as possible, ideally early next week.  Your INR should be rechecked at that time.  In the meantime, return to the emergency department for new, worsening, or persistent severe pain, swelling, numbness in the leg or foot, discoloration of the skin, pain going up the leg, or any other new or worsening symptoms that concern you.

## 2023-10-03 NOTE — ED Triage Notes (Signed)
Pt sent by PCP, DVT was ruled out but pt has enlarging L calf hematoma and there is concern for compartment syndrome. Ongoing since over 1 week. Attempted to get pedal pulse, only had fetal doppler, unable to hear L pedal pulse. L foot is cooler to touch than R foot. L calf is tight and painful.   Called EDP who will come assess pt with pedal doppler. EDP at bedside in triage room. Pedal pulse auscultated with pedal doppler.

## 2023-10-03 NOTE — ED Notes (Signed)
Left calf is red, warm, tender, and mildly swollen posteriorly. Pt AOX4, NAD noted

## 2023-10-03 NOTE — ED Notes (Signed)
Splint placed by ortho tech in place. Cut edge of ortho glass under posterior knee joint padded with cotton wrap at this time. CMS intact, cap refill <3 seconds.

## 2023-11-06 ENCOUNTER — Encounter: Payer: Self-pay | Admitting: Gastroenterology

## 2023-11-20 ENCOUNTER — Encounter: Payer: Self-pay | Admitting: Gastroenterology

## 2023-11-23 ENCOUNTER — Ambulatory Visit: Admitting: Anesthesiology

## 2023-11-23 ENCOUNTER — Ambulatory Visit
Admission: RE | Admit: 2023-11-23 | Discharge: 2023-11-23 | Disposition: A | Discharge status: Home / Self Care | Payer: Medicaid Other | Attending: Gastroenterology | Admitting: Gastroenterology

## 2023-11-23 ENCOUNTER — Encounter
Admission: RE | Disposition: A | Discharge status: Home / Self Care | Payer: Self-pay | Source: Home / Self Care | Attending: Gastroenterology

## 2023-11-23 ENCOUNTER — Encounter: Payer: Self-pay | Admitting: Gastroenterology

## 2023-11-23 ENCOUNTER — Other Ambulatory Visit: Payer: Self-pay

## 2023-11-23 DIAGNOSIS — I509 Heart failure, unspecified: Secondary | ICD-10-CM | POA: Insufficient documentation

## 2023-11-23 DIAGNOSIS — K3189 Other diseases of stomach and duodenum: Secondary | ICD-10-CM | POA: Insufficient documentation

## 2023-11-23 DIAGNOSIS — I110 Hypertensive heart disease with heart failure: Secondary | ICD-10-CM | POA: Insufficient documentation

## 2023-11-23 DIAGNOSIS — K2970 Gastritis, unspecified, without bleeding: Secondary | ICD-10-CM | POA: Diagnosis not present

## 2023-11-23 DIAGNOSIS — K224 Dyskinesia of esophagus: Secondary | ICD-10-CM | POA: Insufficient documentation

## 2023-11-23 DIAGNOSIS — R1310 Dysphagia, unspecified: Secondary | ICD-10-CM | POA: Diagnosis present

## 2023-11-23 DIAGNOSIS — Z7901 Long term (current) use of anticoagulants: Secondary | ICD-10-CM | POA: Insufficient documentation

## 2023-11-23 DIAGNOSIS — Z7902 Long term (current) use of antithrombotics/antiplatelets: Secondary | ICD-10-CM | POA: Insufficient documentation

## 2023-11-23 HISTORY — PX: ESOPHAGOGASTRODUODENOSCOPY (EGD) WITH PROPOFOL: SHX5813

## 2023-11-23 HISTORY — DX: Hyperlipidemia, unspecified: E78.5

## 2023-11-23 HISTORY — DX: Personal history of urinary calculi: Z87.442

## 2023-11-23 SURGERY — ESOPHAGOGASTRODUODENOSCOPY (EGD) WITH PROPOFOL
Anesthesia: General

## 2023-11-23 MED ORDER — DEXMEDETOMIDINE HCL IN NACL 80 MCG/20ML IV SOLN
INTRAVENOUS | Status: DC | PRN
  Administered 2023-11-23: 20 ug via INTRAVENOUS

## 2023-11-23 MED ORDER — PROPOFOL 10 MG/ML IV BOLUS
INTRAVENOUS | Status: DC | PRN
Start: 2023-11-23 — End: 2023-11-23
  Administered 2023-11-23: 50 mg via INTRAVENOUS

## 2023-11-23 MED ORDER — LIDOCAINE HCL (CARDIAC) PF 100 MG/5ML IV SOSY
PREFILLED_SYRINGE | INTRAVENOUS | Status: DC | PRN
  Administered 2023-11-23: 50 mg via INTRAVENOUS

## 2023-11-23 MED ORDER — PROPOFOL 1000 MG/100ML IV EMUL
INTRAVENOUS | Status: AC
  Filled 2023-11-23: qty 100

## 2023-11-23 MED ORDER — PROPOFOL 500 MG/50ML IV EMUL
INTRAVENOUS | Status: DC | PRN
  Administered 2023-11-23: 75 ug/kg/min via INTRAVENOUS

## 2023-11-23 MED ORDER — LIDOCAINE HCL (PF) 2 % IJ SOLN
INTRAMUSCULAR | Status: AC
  Filled 2023-11-23: qty 5

## 2023-11-23 MED ORDER — SODIUM CHLORIDE 0.9 % IV SOLN: INTRAVENOUS | Status: DC

## 2023-11-23 MED ORDER — GLYCOPYRROLATE 0.2 MG/ML IJ SOLN
INTRAMUSCULAR | Status: AC
Start: 2023-11-23 — End: ?
  Filled 2023-11-23: qty 1

## 2023-11-23 MED ORDER — GLYCOPYRROLATE 0.2 MG/ML IJ SOLN
INTRAMUSCULAR | Status: DC | PRN
  Administered 2023-11-23: .2 mg via INTRAVENOUS

## 2023-11-23 NOTE — Transfer of Care (Signed)
 Immediate Anesthesia Transfer of Care Note  Patient: Brett Fernandez  Procedure(s) Performed: ESOPHAGOGASTRODUODENOSCOPY (EGD) WITH PROPOFOL  Patient Location: PACU  Anesthesia Type:General  Level of Consciousness: sedated  Airway & Oxygen Therapy: Patient Spontanous Breathing  Post-op Assessment: Report given to RN and Post -op Vital signs reviewed and stable  Post vital signs: Reviewed and stable  Last Vitals:  Vitals Value Taken Time  BP 129/88 11/23/23 1243  Temp    Pulse 70 11/23/23 1244  Resp 21 11/23/23 1244  SpO2 93 % 11/23/23 1244  Vitals shown include unfiled device data.  Last Pain:  Vitals:   11/23/23 1124  TempSrc: Temporal  PainSc: 0-No pain         Complications: No notable events documented.

## 2023-11-23 NOTE — Anesthesia Preprocedure Evaluation (Signed)
 Anesthesia Evaluation  Patient identified by MRN, date of birth, ID band Patient awake    Reviewed: Allergy & Precautions, NPO status , Patient's Chart, lab work & pertinent test results  Airway Mallampati: III  TM Distance: >3 FB Neck ROM: full    Dental  (+) Chipped, Dental Advidsory Given   Pulmonary shortness of breath and with exertion, COPD,  COPD inhaler, Current Smoker   Pulmonary exam normal        Cardiovascular hypertension, + CAD, + Past MI, + Cardiac Stents and +CHF  Normal cardiovascular exam     Neuro/Psych  PSYCHIATRIC DISORDERS Anxiety Depression Bipolar Disorder   negative neurological ROS     GI/Hepatic ,GERD  Medicated,,  Endo/Other  Hypothyroidism    Renal/GU negative Renal ROS  negative genitourinary   Musculoskeletal   Abdominal   Peds  Hematology negative hematology ROS (+)   Anesthesia Other Findings PMH of 15 shots of liqour a week, CAD, with multiple cardiac stents placed, hx of MI. Patient was seen by cardiology on 10/21/23 and was recommended he complete a echo and stress test. Patient states he completed them but results are not listed. Patient cleared by cardiology for his procedure. Patient states he gets short of breath when he is working. Denies SOB, chest pain at rest. Risks of damage to his heart discussed with the patient. Patient stated he understood and willing to proceed.   Past Medical History: 03/21/2020: Acute respiratory failure with hypoxia (HCC)     Comment:  after multiple bee stings No date: Allergy No date: Anginal pain (HCC) No date: Bipolar disorder (HCC) No date: BPH (benign prostatic hypertrophy) No date: CHF (congestive heart failure) (HCC) No date: COPD (chronic obstructive pulmonary disease) (HCC) 11/02/2015: COPD exacerbation (HCC) No date: Coronary artery disease No date: Deep vein thrombosis (DVT) (HCC) No date: Depression No date: DJD (degenerative joint  disease) No date: Gastric ulcer No date: GERD (gastroesophageal reflux disease) No date: History of hiatal hernia No date: History of kidney stones No date: Hyperlipidemia No date: Hypertension No date: Hypothyroidism 11/02/2015: Lethargy No date: Myocardial infarction (HCC) No date: Neuromuscular disorder (HCC) No date: PTSD (post-traumatic stress disorder)  Past Surgical History: No date: APPENDECTOMY No date: colon polyps 03/21/2021: CORONARY STENT INTERVENTION; N/A     Comment:  Procedure: CORONARY STENT INTERVENTION;  Surgeon:               Alwyn Pea, MD;  Location: [facility];               Service: Cardiovascular;  Laterality: N/A; 06/05/2022: CORONARY STENT INTERVENTION; Left     Comment:  Procedure: CORONARY STENT INTERVENTION;  Surgeon:               Alwyn Pea, MD;  Location: [facility];               Service: Cardiovascular;  Laterality: Left; No date: HEMORRHOIDECTOMY WITH HEMORRHOID BANDING No date: HERNIA REPAIR No date: INNER EAR SURGERY No date: JOINT REPLACEMENT; Left     Comment:  knee 03/21/2021: LEFT HEART CATH AND CORONARY ANGIOGRAPHY; N/A     Comment:  Procedure: LEFT HEART CATH AND CORONARY ANGIOGRAPHY;                Surgeon: Alwyn Pea, MD;  Location: [facility] INV[facility]              CV LAB;  Service: Cardiovascular;  Laterality: N/A; 02/25/2022: RIGHT/LEFT HEART  CATH AND CORONARY ANGIOGRAPHY; Bilateral     Comment:  Procedure: RIGHT/LEFT HEART CATH AND CORONARY               ANGIOGRAPHY;  Surgeon: Alwyn Pea, MD;  Location:              [facility];  Service: Cardiovascular;                Laterality: Bilateral; 09/23/2022: SHOULDER ARTHROSCOPY WITH SUBACROMIAL DECOMPRESSION AND  OPEN ROTATOR C; Right     Comment:  Procedure: Right shoulder arthroscopic rotator cuff               repair, subacromial decompression, distal clavicle               excision, and biceps tenodesis;  Surgeon: Signa Kell,               MD;  Location: [facility];  Service: Orthopedics;                Laterality: Right; 11/01/2015: TOTAL KNEE ARTHROPLASTY; Right     Comment:  Procedure: TOTAL KNEE ARTHROPLASTY;  Surgeon: Juanell Fairly, MD;  Location: [facility] OR[facility];  Service:               Orthopedics;  Laterality: Right;  BMI    Body Mass Index: 25.37 kg/m      Reproductive/Obstetrics negative OB ROS                             Anesthesia Physical Anesthesia Plan  ASA: 3  Anesthesia Plan: General   Post-op Pain Management: Minimal or no pain anticipated   Induction: Intravenous  PONV Risk Score and Plan: 3 and Propofol infusion, TIVA and Ondansetron  Airway Management Planned: Nasal Cannula  Additional Equipment: None  Intra-op Plan:   Post-operative Plan:   Informed Consent: I have reviewed the patients History and Physical, chart, labs and discussed the procedure including the risks, benefits and alternatives for the proposed anesthesia with the patient or authorized representative who has indicated his/her understanding and acceptance.     Dental advisory given  Plan Discussed with: CRNA and Surgeon  Anesthesia Plan Comments: (Discussed risks of anesthesia with patient, including possibility of difficulty with spontaneous ventilation under anesthesia necessitating airway intervention, PONV, and rare risks such as cardiac or respiratory or neurological events, and allergic reactions. Discussed the role of CRNA in patient's perioperative care. Patient understands.)       Anesthesia Quick Evaluation

## 2023-11-23 NOTE — Anesthesia Postprocedure Evaluation (Signed)
 Anesthesia Post Note  Patient: Brett Fernandez  Procedure(s) Performed: ESOPHAGOGASTRODUODENOSCOPY (EGD) WITH PROPOFOL  Patient location during evaluation: PACU Anesthesia Type: General Level of consciousness: awake and alert Pain management: pain level controlled Vital Signs Assessment: post-procedure vital signs reviewed and stable Respiratory status: spontaneous breathing, nonlabored ventilation, respiratory function stable and patient connected to nasal cannula oxygen Cardiovascular status: blood pressure returned to baseline and stable Postop Assessment: no apparent nausea or vomiting Anesthetic complications: no  No notable events documented.   Last Vitals:  Vitals:   11/23/23 1254 11/23/23 1304  BP: (!) 129/95 (!) 138/91  Pulse:    Resp:    Temp:    SpO2:      Last Pain:  Vitals:   11/23/23 1304  TempSrc:   PainSc: 0-No pain                 Stephanie Coup

## 2023-11-23 NOTE — Interval H&P Note (Signed)
 History and Physical Interval Note: Preprocedure H&P from 11/23/23  was reviewed and there was no interval change after seeing and examining the patient.  Written consent was obtained from the patient after discussion of risks, benefits, and alternatives. Patient has consented to proceed with Esophagogastroduodenoscopy with possible intervention   11/23/2023 11:32 AM  Brett Fernandez  has presented today for surgery, with the diagnosis of R13.14 (ICD-10-CM) - Pharyngoesophageal dysphagia R63.4 (ICD-10-CM) - Unintentional weight loss Z87.11 (ICD-10-CM) - History of stomach ulcers.  The various methods of treatment have been discussed with the patient and family. After consideration of risks, benefits and other options for treatment, the patient has consented to  Procedure(s): ESOPHAGOGASTRODUODENOSCOPY (EGD) WITH PROPOFOL (N/A) as a surgical intervention.  The patient's history has been reviewed, patient examined, no change in status, stable for surgery.  I have reviewed the patient's chart and labs.  Questions were answered to the patient's satisfaction.     Jaynie Collins

## 2023-11-23 NOTE — H&P (Signed)
 Pre-Procedure H&P   Patient ID: Brett Fernandez is a 63 y.o. male.  Gastroenterology Provider: Jaynie Collins, DO  Referring Provider: Jacob Moores, PA PCP: Dortha Kern, MD  Date: 11/23/2023  HPI Mr. Brett Fernandez is a 63 y.o. male who presents today for Esophagogastroduodenoscopy for Dysphagia, weight loss .  Patient notes continued dysphagia to solids specifically dry food.  Has difficulty with carbonated beverages.  Also notes regurgitation.  He underwent an esophagram in September 2024 with normal motility and normal passes of his barium tablet.  No hiatal hernia was appreciated at that time.  EGD was performed last in 2007 with duodenitis.  Gastritis negative for H. pylori.  Patient is on Brilinta (held 3 days) and Coumadin (held 5 days) which have been held for this procedure Per chart review patient has chronic narcotic use and additionally drinks 16 shots of liquor per week  Hemoglobin 13 MCV 101 platelets 299,000 creatinine 1.27   Past Medical History:  Diagnosis Date   Acute respiratory failure with hypoxia (HCC) 03/21/2020   after multiple bee stings   Allergy    Anginal pain (HCC)    Bipolar disorder (HCC)    BPH (benign prostatic hypertrophy)    CHF (congestive heart failure) (HCC)    COPD (chronic obstructive pulmonary disease) (HCC)    COPD exacerbation (HCC) 11/02/2015   Coronary artery disease    Deep vein thrombosis (DVT) (HCC)    Depression    DJD (degenerative joint disease)    Gastric ulcer    GERD (gastroesophageal reflux disease)    History of hiatal hernia    History of kidney stones    Hyperlipidemia    Hypertension    Hypothyroidism    Lethargy 11/02/2015   Myocardial infarction (HCC)    Neuromuscular disorder (HCC)    PTSD (post-traumatic stress disorder)     Past Surgical History:  Procedure Laterality Date   APPENDECTOMY     colon polyps     CORONARY STENT INTERVENTION N/A 03/21/2021   Procedure: CORONARY STENT  INTERVENTION;  Surgeon: Alwyn Pea, MD;  Location: [facility];  Service: Cardiovascular;  Laterality: N/A;   CORONARY STENT INTERVENTION Left 06/05/2022   Procedure: CORONARY STENT INTERVENTION;  Surgeon: Alwyn Pea, MD;  Location: [facility];  Service: Cardiovascular;  Laterality: Left;   HEMORRHOIDECTOMY WITH HEMORRHOID BANDING     HERNIA REPAIR     INNER EAR SURGERY     JOINT REPLACEMENT Left    knee   LEFT HEART CATH AND CORONARY ANGIOGRAPHY N/A 03/21/2021   Procedure: LEFT HEART CATH AND CORONARY ANGIOGRAPHY;  Surgeon: Alwyn Pea, MD;  Location: [facility];  Service: Cardiovascular;  Laterality: N/A;   RIGHT/LEFT HEART CATH AND CORONARY ANGIOGRAPHY Bilateral 02/25/2022   Procedure: RIGHT/LEFT HEART CATH AND CORONARY ANGIOGRAPHY;  Surgeon: Alwyn Pea, MD;  Location: [facility];  Service: Cardiovascular;  Laterality: Bilateral;   SHOULDER ARTHROSCOPY WITH SUBACROMIAL DECOMPRESSION AND OPEN ROTATOR C Right 09/23/2022   Procedure: Right shoulder arthroscopic rotator cuff repair, subacromial decompression, distal clavicle excision, and biceps tenodesis;  Surgeon: Signa Kell, MD;  Location: [facility];  Service: Orthopedics;  Laterality: Right;   TOTAL KNEE ARTHROPLASTY Right 11/01/2015   Procedure: TOTAL KNEE ARTHROPLASTY;  Surgeon: Juanell Fairly, MD;  Location: [facility];  Service: Orthopedics;  Laterality: Right;    Family History No h/o GI disease or malignancy  Review of Systems  Constitutional:  Negative  for activity change, appetite change, chills, diaphoresis, fatigue, fever and unexpected weight change.  HENT:  Positive for trouble swallowing. Negative for voice change.   Respiratory:  Negative for shortness of breath and wheezing.   Cardiovascular:  Negative for chest pain, palpitations and leg swelling.  Gastrointestinal:  Negative for abdominal distention, abdominal pain, anal bleeding, blood in stool,  constipation, diarrhea, nausea and vomiting.  Musculoskeletal:  Negative for arthralgias and myalgias.  Skin:  Negative for color change and pallor.  Neurological:  Negative for dizziness, syncope and weakness.  Psychiatric/Behavioral:  Negative for confusion. The patient is not nervous/anxious.   All other systems reviewed and are negative.    Medications No current facility-administered medications on file prior to encounter.   Current Outpatient Medications on File Prior to Encounter  Medication Sig Dispense Refill   albuterol (PROVENTIL) (2.5 MG/3ML) 0.083% nebulizer solution Take 3 mLs (2.5 mg total) by nebulization every 6 (six) hours as needed for wheezing or shortness of breath. 75 mL 12   albuterol (VENTOLIN HFA) 108 (90 Base) MCG/ACT inhaler Inhale 1-2 puffs into the lungs every 6 (six) hours as needed for wheezing or shortness of breath.     atomoxetine (STRATTERA) 80 MG capsule Take 80 mg by mouth daily.     atorvastatin (LIPITOR) 80 MG tablet Take 80 mg by mouth at bedtime.     b complex vitamins tablet Take 1 tablet by mouth daily.     cyclobenzaprine (FLEXERIL) 5 MG tablet Take 5 mg by mouth at bedtime as needed.     EPINEPHrine 0.3 mg/0.3 mL IJ SOAJ injection Inject 0.3 mLs (0.3 mg total) into the muscle as needed for anaphylaxis. 1 each 0   fluticasone (FLONASE) 50 MCG/ACT nasal spray Place 2 sprays into both nostrils daily.     furosemide (LASIX) 20 MG tablet Take 60 mg by mouth daily as needed for edema.     isosorbide mononitrate (IMDUR) 60 MG 24 hr tablet Take 60 mg by mouth daily.     levothyroxine (SYNTHROID, LEVOTHROID) 50 MCG tablet Take 50 mcg by mouth daily before breakfast.     lisinopril (ZESTRIL) 5 MG tablet Take 5 mg by mouth daily.     loratadine (CLARITIN) 10 MG tablet Take 10 mg by mouth daily.      meclizine (ANTIVERT) 25 MG tablet Take 25 mg by mouth 3 (three) times daily as needed.     metoprolol succinate (TOPROL-XL) 50 MG 24 hr tablet Take 50 mg by  mouth daily. Take with or immediately following a meal.     Multiple Vitamins-Minerals (CENTRUM SILVER 50+MEN PO) Take 1 tablet by mouth daily.     Nebulizer System All-In-One MISC Use as directed for COPD 1 each 0   nitroGLYCERIN (NITROSTAT) 0.4 MG SL tablet Place 1 tablet (0.4 mg total) under the tongue every 5 (five) minutes as needed for chest pain. 30 tablet 2   omeprazole (PRILOSEC) 40 MG capsule Take 40 mg by mouth daily.      ondansetron (ZOFRAN-ODT) 4 MG disintegrating tablet Take 1 tablet (4 mg total) by mouth every 8 (eight) hours as needed for nausea or vomiting. 20 tablet 0   oxyCODONE (ROXICODONE) 5 MG immediate release tablet Take 1-2 tablets (5-10 mg total) by mouth every 4 (four) hours as needed (pain). 30 tablet 0   Oxycodone HCl 10 MG TABS Take 10 mg by mouth 3 (three) times daily as needed (pain).     potassium citrate (UROCIT-K) 10 MEQ (1080  MG) SR tablet Take 20 mEq by mouth 2 (two) times daily.      pregabalin (LYRICA) 200 MG capsule Take 200-400 mg by mouth 2 (two) times daily. 400 mg qam and 200 mg qhs     tamsulosin (FLOMAX) 0.4 MG CAPS capsule Take 0.8 mg by mouth daily.     ticagrelor (BRILINTA) 90 MG TABS tablet Take 1 tablet (90 mg total) by mouth 2 (two) times daily. 60 tablet 6   traZODone (DESYREL) 100 MG tablet Take 200 mg by mouth at bedtime.     warfarin (COUMADIN) 4 MG tablet Take 4 mg by mouth at bedtime.     zinc gluconate 50 MG tablet Take 100 mg by mouth daily.      Pertinent medications related to GI and procedure were reviewed by me with the patient prior to the procedure   Current Facility-Administered Medications:    0.9 %  sodium chloride infusion, , Intravenous, Continuous, Jaynie Collins, DO  sodium chloride         Allergies  Allergen Reactions   Aspirin Other (See Comments)    Other Reaction: Analeigh Aries-johnson reaction [name]   Bee Venom Anaphylaxis   Sulfa Antibiotics Swelling    Other reaction(s): Unknown    Allergies were reviewed by me prior to the procedure  Objective   Body mass index is 25.37 kg/m. Vitals:   11/23/23 1124  BP: (!) 172/99  Pulse: 73  Resp: 18  Temp: (!) 97.2 F (36.2 C)  TempSrc: Temporal  SpO2: 100%  Weight: 73.5 kg  Height: 5\' 7"  (1.702 m)     Physical Exam Vitals and nursing note reviewed.  Constitutional:      General: He is not in acute distress.    Appearance: Normal appearance. He is not ill-appearing, toxic-appearing or diaphoretic.  HENT:     Head: Normocephalic and atraumatic.     Nose: Nose normal.     Mouth/Throat:     Mouth: Mucous membranes are moist.     Pharynx: Oropharynx is clear.  Eyes:     General: No scleral icterus.    Extraocular Movements: Extraocular movements intact.  Cardiovascular:     Rate and Rhythm: Normal rate and regular rhythm.     Heart sounds: Normal heart sounds. No murmur heard.    No friction rub. No gallop.  Pulmonary:     Effort: Pulmonary effort is normal. No respiratory distress.     Breath sounds: No wheezing, rhonchi or rales.     Comments: Diminished breath sounds bilaterally Abdominal:     General: Bowel sounds are normal. There is no distension.     Palpations: Abdomen is soft.     Tenderness: There is no abdominal tenderness. There is no guarding or rebound.  Musculoskeletal:     Cervical back: Neck supple.     Right lower leg: No edema.     Left lower leg: No edema.  Skin:    General: Skin is warm and dry.     Coloration: Skin is not jaundiced or pale.  Neurological:     General: No focal deficit present.     Mental Status: He is alert and oriented to person, place, and time. Mental status is at baseline.  Psychiatric:        Mood and Affect: Mood normal.        Behavior: Behavior normal.        Thought Content: Thought content normal.  Judgment: Judgment normal.      Assessment:  Mr. KAIDEN PECH is a 63 y.o. male  who presents today for Esophagogastroduodenoscopy for  dysphagia.  Plan:  Esophagogastroduodenoscopy with possible intervention today  Esophagogastroduodenoscopy with possible biopsy, control of bleeding, polypectomy, and interventions as necessary has been discussed with the patient/patient representative. Informed consent was obtained from the patient/patient representative after explaining the indication, nature, and risks of the procedure including but not limited to death, bleeding, perforation, missed neoplasm/lesions, cardiorespiratory compromise, and reaction to medications. Opportunity for questions was given and appropriate answers were provided. Patient/patient representative has verbalized understanding is amenable to undergoing the procedure.   Jaynie Collins, DO  [facility] [place] [facility]  Portions of the record may have been created with voice recognition software. Occasional wrong-word or 'sound-a-like' substitutions may have occurred due to the inherent limitations of voice recognition software.  Read the chart carefully and recognize, using context, where substitutions may have occurred.

## 2023-11-23 NOTE — Op Note (Signed)
 [facility] Patient Name: Brett Fernandez Procedure Date: 11/23/2023 12:21 PM MRN: [id number] Account #: [id number] Date of Birth: 1961/08/12 Admit Type: Outpatient Age: 63 Room: [facility] ENDO ROOM 2 Gender: Male Note Status: Finalized Instrument Name: Upper Endoscope [id number] Procedure:             Upper GI endoscopy Indications:           Dysphagia Providers:             Jaynie Collins DO, DO Medicines:             Monitored Anesthesia Care Complications:         No immediate complications. Estimated blood loss:                         Minimal. Procedure:             Pre-Anesthesia Assessment:                        - Prior to the procedure, a History and Physical was                         performed, and patient medications and allergies were                         reviewed. The patient is competent. The risks and                         benefits of the procedure and the sedation options and                         risks were discussed with the patient. All questions                         were answered and informed consent was obtained.                         Patient identification and proposed procedure were                         verified by the physician, the nurse, the anesthetist                         and the technician in the endoscopy suite. Mental                         Status Examination: alert and oriented. Airway                         Examination: normal oropharyngeal airway and neck                         mobility. Respiratory Examination: poor air movement.                         CV Examination: RRR, no murmurs, no S3 or S4.                         Prophylactic Antibiotics: The patient does not require  prophylactic antibiotics. Prior Anticoagulants: The                         patient has taken Brilinta and Coumadin, last doses                         were 3 and 5 days respectively. ASA Grade  Assessment:                         III - A patient with severe systemic disease. After                         reviewing the risks and benefits, the patient was                         deemed in satisfactory condition to undergo the                         procedure. The anesthesia plan was to use monitored                         anesthesia care (MAC). Immediately prior to                         administration of medications, the patient was                         re-assessed for adequacy to receive sedatives. The                         heart rate, respiratory rate, oxygen saturations,                         blood pressure, adequacy of pulmonary ventilation, and                         response to care were monitored throughout the                         procedure. The physical status of the patient was                         re-assessed after the procedure.                        After obtaining informed consent, the endoscope was                         passed under direct vision. Throughout the procedure,                         the patient's blood pressure, pulse, and oxygen                         saturations were monitored continuously. The Endoscope                         was introduced through the mouth, and advanced to the  second part of duodenum. The upper GI endoscopy was                         accomplished without difficulty. The patient tolerated                         the procedure well. Findings:      Localized mucosal flattening was found in the second portion of the       duodenum. Biopsies for histology were taken with a cold forceps for       evaluation of celiac disease. Estimated blood loss: none.      The exam of the duodenum was otherwise normal.      Localized mild inflammation characterized by erythema was found in the       gastric antrum. Biopsies were taken with a cold forceps for Helicobacter       pylori testing. Estimated  blood loss was minimal.      Diffuse moderate mucosal changes characterized by granularity were found       in the gastric body. Biopsies were taken with a cold forceps for       histology. Estimated blood loss was minimal.      The exam of the stomach was otherwise normal.      The Z-line was regular. Estimated blood loss: none.      Esophagogastric landmarks were identified: the gastroesophageal junction       was found at 44 cm from the incisors.      Normal mucosa was found in the entire esophagus. Biopsies were obtained       from the proximal and distal esophagus with cold forceps for histology       of suspected eosinophilic esophagitis. Estimated blood loss was minimal.      Abnormal motility was noted in the esophagus. The cricopharyngeus was       normal. There is spasticity of the esophageal body. The distal       esophagus/lower esophageal sphincter is open. Estimated blood loss: none.      The exam of the esophagus was otherwise normal. Impression:            - Flattened mucosa was found in the duodenum. Biopsied.                        - Gastritis. Biopsied.                        - Granular mucosa in the gastric body. Biopsied.                        - Z-line regular.                        - Esophagogastric landmarks identified.                        - Normal mucosa was found in the entire esophagus.                        - Abnormal esophageal motility, consistent with                         esophageal spasm.                        -  Biopsies were taken with a cold forceps for                         evaluation of eosinophilic esophagitis. Recommendation:        - Patient has a contact number available for                         emergencies. The signs and symptoms of potential                         delayed complications were discussed with the patient.                         Return to normal activities tomorrow. Written                         discharge  instructions were provided to the patient.                        - Discharge patient to home.                        - Resume previous diet.                        - Continue present medications.                        - No ibuprofen, naproxen, or other non-steroidal                         anti-inflammatory drugs.                        - Resume Brilinta (ticagrelor) tomorrow and Coumadin                         (warfarin) tomorrow at prior doses. Refer to managing                         physician for further adjustment of therapy.                        - Await pathology results.                        - Return to GI clinic as previously scheduled.                        - Recommend reducing motility offending agents as much                         as possible- ie muscle relaxants, opioids.                        Consider motility and manometry study                        Patient is currently up to date on Lung Cancer  Screening CT (last 01/2023) with no findings of                         malignancy.                        - The findings and recommendations were discussed with                         the patient. Procedure Code(s):     --- Professional ---                        [phone], Esophagogastroduodenoscopy, flexible,                         transoral; with biopsy, single or multiple Diagnosis Code(s):     --- Professional ---                        K31.89, Other diseases of stomach and duodenum                        K29.70, Gastritis, unspecified, without bleeding                        K22.4, Dyskinesia of esophagus                        R13.10, Dysphagia, unspecified CPT copyright 2022 [employer]. All rights reserved. The codes documented in this report are preliminary and upon coder review may  be revised to meet current compliance requirements. Attending Participation:      I personally performed the entire procedure. Elfredia Nevins, DO Jaynie Collins DO, DO 11/23/2023 12:49:37 PM This report has been signed electronically. Number of Addenda: 0 Note Initiated On: 11/23/2023 12:21 PM Estimated Blood Loss:  Estimated blood loss was minimal.      [facility]

## 2023-11-24 LAB — SURGICAL PATHOLOGY

## 2024-01-29 ENCOUNTER — Other Ambulatory Visit: Payer: Self-pay | Admitting: Family Medicine

## 2024-01-29 ENCOUNTER — Ambulatory Visit
Admission: RE | Admit: 2024-01-29 | Discharge: 2024-01-29 | Disposition: A | Discharge status: Home / Self Care | Source: Ambulatory Visit | Attending: Family Medicine | Admitting: Family Medicine

## 2024-01-29 DIAGNOSIS — R109 Unspecified abdominal pain: Secondary | ICD-10-CM | POA: Insufficient documentation

## 2024-01-29 MED ORDER — IOHEXOL 300 MG/ML  SOLN
100.0000 mL | Freq: Once | INTRAMUSCULAR | Status: AC | PRN
  Administered 2024-01-29: 100 mL via INTRAVENOUS

## 2024-02-02 ENCOUNTER — Other Ambulatory Visit: Payer: Self-pay | Admitting: Acute Care

## 2024-02-02 DIAGNOSIS — Z122 Encounter for screening for malignant neoplasm of respiratory organs: Secondary | ICD-10-CM

## 2024-02-02 DIAGNOSIS — Z87891 Personal history of nicotine dependence: Secondary | ICD-10-CM

## 2024-02-02 DIAGNOSIS — F17210 Nicotine dependence, cigarettes, uncomplicated: Secondary | ICD-10-CM

## 2024-02-12 ENCOUNTER — Ambulatory Visit
Admission: RE | Admit: 2024-02-12 | Discharge: 2024-02-12 | Disposition: A | Discharge status: Home / Self Care | Source: Ambulatory Visit | Attending: Acute Care | Admitting: Acute Care

## 2024-02-12 DIAGNOSIS — N200 Calculus of kidney: Secondary | ICD-10-CM | POA: Insufficient documentation

## 2024-02-12 DIAGNOSIS — Z87891 Personal history of nicotine dependence: Secondary | ICD-10-CM | POA: Insufficient documentation

## 2024-02-12 DIAGNOSIS — Z122 Encounter for screening for malignant neoplasm of respiratory organs: Secondary | ICD-10-CM | POA: Diagnosis present

## 2024-02-12 DIAGNOSIS — I2510 Atherosclerotic heart disease of native coronary artery without angina pectoris: Secondary | ICD-10-CM | POA: Insufficient documentation

## 2024-02-12 DIAGNOSIS — F17210 Nicotine dependence, cigarettes, uncomplicated: Secondary | ICD-10-CM | POA: Diagnosis present

## 2024-02-12 DIAGNOSIS — J439 Emphysema, unspecified: Secondary | ICD-10-CM | POA: Insufficient documentation

## 2024-02-12 DIAGNOSIS — I700 Atherosclerosis of aorta: Secondary | ICD-10-CM | POA: Diagnosis not present

## 2024-03-03 ENCOUNTER — Other Ambulatory Visit: Payer: Self-pay

## 2024-03-03 DIAGNOSIS — Z87891 Personal history of nicotine dependence: Secondary | ICD-10-CM

## 2024-03-03 DIAGNOSIS — F17210 Nicotine dependence, cigarettes, uncomplicated: Secondary | ICD-10-CM

## 2024-03-03 DIAGNOSIS — Z122 Encounter for screening for malignant neoplasm of respiratory organs: Secondary | ICD-10-CM

## 2024-05-10 ENCOUNTER — Other Ambulatory Visit
Admission: RE | Admit: 2024-05-10 | Discharge: 2024-05-10 | Disposition: A | Discharge status: Home / Self Care | Attending: Urology | Admitting: Urology

## 2024-05-10 ENCOUNTER — Other Ambulatory Visit: Payer: Self-pay

## 2024-05-10 ENCOUNTER — Ambulatory Visit (INDEPENDENT_AMBULATORY_CARE_PROVIDER_SITE_OTHER): Admitting: Urology

## 2024-05-10 ENCOUNTER — Encounter: Payer: Self-pay | Admitting: Urology

## 2024-05-10 VITALS — BP 105/65 | HR 60 | Ht 68.0 in | Wt 160.0 lb

## 2024-05-10 DIAGNOSIS — Z125 Encounter for screening for malignant neoplasm of prostate: Secondary | ICD-10-CM | POA: Diagnosis not present

## 2024-05-10 DIAGNOSIS — Z87442 Personal history of urinary calculi: Secondary | ICD-10-CM | POA: Insufficient documentation

## 2024-05-10 DIAGNOSIS — R399 Unspecified symptoms and signs involving the genitourinary system: Secondary | ICD-10-CM | POA: Insufficient documentation

## 2024-05-10 DIAGNOSIS — R1084 Generalized abdominal pain: Secondary | ICD-10-CM

## 2024-05-10 LAB — BLADDER SCAN AMB NON-IMAGING

## 2024-05-10 LAB — URINALYSIS, COMPLETE (UACMP) WITH MICROSCOPIC
Bilirubin Urine: NEGATIVE
Glucose, UA: NEGATIVE mg/dL
Ketones, ur: NEGATIVE mg/dL
Leukocytes,Ua: NEGATIVE
Nitrite: NEGATIVE
Protein, ur: NEGATIVE mg/dL
RBC / HPF: >50 RBC/hpf (ref 0–5)
Specific Gravity, Urine: 1.015 (ref 1.005–1.030)
WBC, UA: NONE SEEN WBC/hpf (ref 0–5)
pH: 6 (ref 5.0–8.0)

## 2024-05-10 NOTE — Progress Notes (Signed)
 05/10/24 1:14 PM   Brett Fernandez 22-Feb-1961 [id number]  CC: Nephrolithiasis, BPH/LUTS, dysuria  HPI: Extremely comorbid 63 year old male with chronic pain, bipolar disorder, CHF, COPD, CAD, history of DVT, anticoagulation with Coumadin , reported history of nephrolithiasis, referred for the above issues.  He has chronic pain on narcotics.  He had a CT scan in May 2025 showing small nonobstructive renal stones, no hydronephrosis.  He reports a few weeks of flank pain that moves from right to left as well as burning with urination.  Urinalysis with PCP was benign.  He uses Flomax  which he feels is helpful for his obstructive urinary symptoms.  Urinalysis today greater than 50 RBC but no other abnormalities, PVR normal today at 38ml.  Recent PSA normal at 2.3.  Reportedly has previously undergone both shockwave lithotripsy and ureteroscopy with prior urologist, those records are not available to me.  PMH: Past Medical History:  Diagnosis Date   Acute respiratory failure with hypoxia (HCC) 03/21/2020   after multiple bee stings   Allergy    Anginal pain (HCC)    Bipolar disorder (HCC)    BPH (benign prostatic hypertrophy)    CHF (congestive heart failure) (HCC)    COPD (chronic obstructive pulmonary disease) (HCC)    COPD exacerbation (HCC) 11/02/2015   Coronary artery disease    Deep vein thrombosis (DVT) (HCC)    Depression    DJD (degenerative joint disease)    Gastric ulcer    GERD (gastroesophageal reflux disease)    History of hiatal hernia    History of kidney stones    Hyperlipidemia    Hypertension    Hypothyroidism    Lethargy 11/02/2015   Myocardial infarction (HCC)    Neuromuscular disorder (HCC)    PTSD (post-traumatic stress disorder)     Surgical History: Past Surgical History:  Procedure Laterality Date   APPENDECTOMY     colon polyps     CORONARY STENT INTERVENTION N/A 03/21/2021   Procedure: CORONARY STENT INTERVENTION;  Surgeon: Florencio Cara BIRCH, MD;  Location: [facility];  Service: Cardiovascular;  Laterality: N/A;   CORONARY STENT INTERVENTION Left 06/05/2022   Procedure: CORONARY STENT INTERVENTION;  Surgeon: Florencio Cara BIRCH, MD;  Location: [facility];  Service: Cardiovascular;  Laterality: Left;   ESOPHAGOGASTRODUODENOSCOPY (EGD) WITH PROPOFOL  N/A 11/23/2023   Procedure: ESOPHAGOGASTRODUODENOSCOPY (EGD) WITH PROPOFOL ;  Surgeon: Onita Elspeth Sharper, DO;  Location: [facility];  Service: Gastroenterology;  Laterality: N/A;   HEMORRHOIDECTOMY WITH HEMORRHOID BANDING     HERNIA REPAIR     INNER EAR SURGERY     JOINT REPLACEMENT Left    knee   LEFT HEART CATH AND CORONARY ANGIOGRAPHY N/A 03/21/2021   Procedure: LEFT HEART CATH AND CORONARY ANGIOGRAPHY;  Surgeon: Florencio Cara BIRCH, MD;  Location: [facility];  Service: Cardiovascular;  Laterality: N/A;   RIGHT/LEFT HEART CATH AND CORONARY ANGIOGRAPHY Bilateral 02/25/2022   Procedure: RIGHT/LEFT HEART CATH AND CORONARY ANGIOGRAPHY;  Surgeon: Florencio Cara BIRCH, MD;  Location: [facility];  Service: Cardiovascular;  Laterality: Bilateral;   SHOULDER ARTHROSCOPY WITH SUBACROMIAL DECOMPRESSION AND OPEN ROTATOR C Right 09/23/2022   Procedure: Right shoulder arthroscopic rotator cuff repair, subacromial decompression, distal clavicle excision, and biceps tenodesis;  Surgeon: Tobie Priest, MD;  Location: [facility];  Service: Orthopedics;  Laterality: Right;   TOTAL KNEE ARTHROPLASTY Right 11/01/2015   Procedure: TOTAL KNEE ARTHROPLASTY;  Surgeon: Franky Cranker, MD;  Location: [facility];  Service: Orthopedics;  Laterality: Right;  Family History: Family History  Problem Relation Age of Onset   Depression Mother    Diabetes Mother    Heart disease Mother    COPD Mother    Heart disease Father    Cancer Father     Social History:  reports that he has been smoking cigarettes. He started smoking about 56 years ago. He has a 123.8 pack-year  smoking history. He has never used smokeless tobacco. He reports current alcohol use. He reports that he does not use drugs.  Physical Exam: BP 105/65 (BP Location: Left Arm, Patient Position: Sitting, Cuff Size: Normal)   Pulse 60   Ht 5' 8 (1.727 m)   Wt 160 lb (72.6 kg)   SpO2 95%   BMI 24.33 kg/m    Constitutional:  Alert and oriented, No acute distress. Cardiovascular: No clubbing, cyanosis, or edema. Respiratory: Normal respiratory effort, no increased work of breathing. GI: Abdomen is soft, nontender, nondistended, no abdominal masses  Laboratory Data: Urinalysis with greater than 50 RBC today  Pertinent Imaging: I have personally viewed and interpreted the CT scan from May 2025 showing a 41g prostate, bilateral small nonobstructive renal stones, no hydronephrosis.  Assessment & Plan:   63 year old male with chronic pain on narcotics and a multitude of other medical issues here with pain in both flanks intermittently and dysuria.  CT from May 2025 was benign with small nonobstructive renal stones and no hydronephrosis.  PSA normal at 2.3.  New microscopic hematuria today in the setting of flank pain and I recommended CT for further evaluation of migration of his previously nonobstructing renal stone seen on CT from May 2025.  We discussed various treatment options for urolithiasis including observation with or without medical expulsive therapy, shockwave lithotripsy (SWL), ureteroscopy and laser lithotripsy with stent placement, and percutaneous nephrolithotomy.We discussed that management is based on stone size, location, density, patient co-morbidities, and patient preference.Stones <75mm in size have a >80% spontaneous passage rate. Data surrounding the use of tamsulosin  for medical expulsive therapy is controversial, but meta analyses suggests it is most efficacious for distal stones between 5-35mm in size. Possible side effects include dizziness/lightheadedness, and retrograde  ejaculation.SWL has a lower stone free rate in a single procedure, but also a lower complication rate compared to ureteroscopy and avoids a stent and associated stent related symptoms. Possible complications include renal hematoma, steinstrasse, and need for additional treatment.Ureteroscopy with laser lithotripsy and stent placement has a higher stone free rate than SWL in a single procedure, however increased complication rate including possible infection, ureteral injury, bleeding, and stent related morbidity. Common stent related symptoms include dysuria, urgency/frequency, and flank pain.  CT for evaluation of ureteral stone, call with results  Redell Burnet, MD 05/10/2024  [facility] [address], [address] [place], [place] [address] [phone]

## 2024-05-10 NOTE — Patient Instructions (Signed)
   Please Call [phone] to schedule the CT scan

## 2024-05-11 LAB — URINE CULTURE: Culture: <10000 — AB

## 2024-05-17 ENCOUNTER — Ambulatory Visit
Admission: RE | Admit: 2024-05-17 | Discharge: 2024-05-17 | Disposition: A | Discharge status: Home / Self Care | Source: Ambulatory Visit | Attending: Urology | Admitting: Urology

## 2024-05-17 DIAGNOSIS — R1084 Generalized abdominal pain: Secondary | ICD-10-CM | POA: Insufficient documentation

## 2024-05-26 ENCOUNTER — Other Ambulatory Visit: Payer: Self-pay

## 2024-05-26 ENCOUNTER — Ambulatory Visit: Payer: Self-pay | Admitting: Urology

## 2024-05-26 DIAGNOSIS — N200 Calculus of kidney: Secondary | ICD-10-CM

## 2024-05-26 NOTE — Telephone Encounter (Signed)
 KUB & UA ordered. Appt scheduled.

## 2024-06-09 ENCOUNTER — Other Ambulatory Visit
Admission: RE | Admit: 2024-06-09 | Discharge: 2024-06-09 | Disposition: A | Discharge status: Home / Self Care | Source: Home / Self Care | Attending: Urology | Admitting: Urology

## 2024-06-09 ENCOUNTER — Encounter: Payer: Self-pay | Admitting: Urology

## 2024-06-09 ENCOUNTER — Ambulatory Visit: Admitting: Urology

## 2024-06-09 ENCOUNTER — Ambulatory Visit
Admission: RE | Admit: 2024-06-09 | Discharge: 2024-06-09 | Disposition: A | Discharge status: Home / Self Care | Source: Ambulatory Visit | Attending: Urology | Admitting: Urology

## 2024-06-09 ENCOUNTER — Ambulatory Visit
Admission: RE | Admit: 2024-06-09 | Discharge: 2024-06-09 | Disposition: A | Discharge status: Home / Self Care | Attending: Urology | Admitting: Urology

## 2024-06-09 VITALS — BP 115/71 | HR 74 | Ht 68.0 in | Wt 160.0 lb

## 2024-06-09 DIAGNOSIS — N200 Calculus of kidney: Secondary | ICD-10-CM | POA: Insufficient documentation

## 2024-06-09 DIAGNOSIS — R399 Unspecified symptoms and signs involving the genitourinary system: Secondary | ICD-10-CM | POA: Diagnosis not present

## 2024-06-09 LAB — URINALYSIS, COMPLETE (UACMP) WITH MICROSCOPIC
Bilirubin Urine: NEGATIVE
Glucose, UA: NEGATIVE mg/dL
Hgb urine dipstick: NEGATIVE
Ketones, ur: NEGATIVE mg/dL
Leukocytes,Ua: NEGATIVE
Nitrite: NEGATIVE
Protein, ur: NEGATIVE mg/dL
Specific Gravity, Urine: 1.015 (ref 1.005–1.030)
pH: 7 (ref 5.0–8.0)

## 2024-06-09 LAB — BLADDER SCAN AMB NON-IMAGING

## 2024-06-09 NOTE — Patient Instructions (Signed)
 Brett Fernandez

## 2024-06-09 NOTE — Progress Notes (Signed)
   06/09/2024 2:48 PM   Brett Fernandez 1961/06/01 [id number]  Reason for visit: Follow up Nephrolithiasis, BPH/LUTS  History: Initial visit August 2025 for nephrolithiasis and BPH/LUTS Multiple comorbidities including chronic pain, bipolar disorder, CHF, COPD, CAD, history of DVT, anticoagulation with Coumadin , chronic pain on narcotics Was having bilateral flank pain at visit August 2025 prompting CT showing a 3 mm left mid ureteral stone and he opted for trial of medical expulsive therapy Urinary symptoms well-controlled on Flomax  with normal PVRs  Physical Exam: BP 115/71 (BP Location: Left Arm, Patient Position: Sitting, Cuff Size: Normal)   Pulse 74   Ht 5' 8 (1.727 m)   Wt 160 lb (72.6 kg)   SpO2 99%   BMI 24.33 kg/m    Imaging/labs: I personally viewed interpreted the KUB today that shows no evidence of ureteral stone Urinalysis today benign  Today: He reports he passed a small stone, left-sided flank pain has resolved No significant urinary symptoms aside from he feels like he does not urinate as often as he thinks he should, PVR normal again today at 19ml and reassurance provided  Plan:   Kidney stones: Left ureteral stone passed, symptoms resolved. We discussed general stone prevention strategies including adequate hydration with goal of producing 2.5 L of urine daily, increasing citric acid intake, increasing calcium  intake during high oxalate meals, minimizing animal protein, and decreasing salt intake. Information about dietary recommendations given today.  BPH/LUTS: Continue Flomax , PVRs have been normal, return precautions discussed RTC 1 year PVR, KUB prior   Brett JAYSON Burnet, MD  [facility] [address] [place], [place] [address] [phone]

## 2024-10-12 ENCOUNTER — Other Ambulatory Visit: Payer: Self-pay | Admitting: Family Medicine

## 2024-10-12 DIAGNOSIS — R059 Cough, unspecified: Secondary | ICD-10-CM

## 2024-10-13 ENCOUNTER — Ambulatory Visit
Admission: RE | Admit: 2024-10-13 | Discharge: 2024-10-13 | Disposition: A | Discharge status: Home / Self Care | Source: Ambulatory Visit | Attending: Family Medicine | Admitting: Family Medicine

## 2024-10-13 DIAGNOSIS — R059 Cough, unspecified: Secondary | ICD-10-CM | POA: Diagnosis present

## 2025-06-09 ENCOUNTER — Ambulatory Visit: Admitting: Urology
